# Patient Record
Sex: Male | Born: 1945 | Race: White | Hispanic: No | Marital: Married | Smoking: Never smoker
Health system: Southern US, Community
[De-identification: ages and names within clinical notes are randomized; demographics above are authoritative.]

## PROBLEM LIST (undated history)

## (undated) DIAGNOSIS — T8859XA Other complications of anesthesia, initial encounter: Secondary | ICD-10-CM

## (undated) DIAGNOSIS — Z86711 Personal history of pulmonary embolism: Secondary | ICD-10-CM

## (undated) DIAGNOSIS — G4730 Sleep apnea, unspecified: Secondary | ICD-10-CM

## (undated) DIAGNOSIS — E039 Hypothyroidism, unspecified: Secondary | ICD-10-CM

## (undated) DIAGNOSIS — I10 Essential (primary) hypertension: Secondary | ICD-10-CM

## (undated) DIAGNOSIS — C801 Malignant (primary) neoplasm, unspecified: Secondary | ICD-10-CM

## (undated) DIAGNOSIS — K56609 Unspecified intestinal obstruction, unspecified as to partial versus complete obstruction: Secondary | ICD-10-CM

## (undated) DIAGNOSIS — E119 Type 2 diabetes mellitus without complications: Secondary | ICD-10-CM

## (undated) DIAGNOSIS — K219 Gastro-esophageal reflux disease without esophagitis: Secondary | ICD-10-CM

## (undated) DIAGNOSIS — M1990 Unspecified osteoarthritis, unspecified site: Secondary | ICD-10-CM

## (undated) HISTORY — PX: HERNIA REPAIR: SHX51

## (undated) HISTORY — PX: CARPAL TUNNEL RELEASE: SHX101

## (undated) HISTORY — PX: CERVICAL FUSION: SHX112

## (undated) HISTORY — PX: BACK SURGERY: SHX140

## (undated) HISTORY — PX: ABDOMINAL SURGERY: SHX537

---

## 1998-08-17 ENCOUNTER — Ambulatory Visit (HOSPITAL_COMMUNITY): Admission: RE | Admit: 1998-08-17 | Discharge: 1998-08-17 | Payer: Self-pay | Admitting: Neurosurgery

## 2001-12-27 ENCOUNTER — Encounter: Payer: Self-pay | Admitting: Gastroenterology

## 2001-12-27 ENCOUNTER — Ambulatory Visit (HOSPITAL_COMMUNITY): Admission: RE | Admit: 2001-12-27 | Discharge: 2001-12-27 | Payer: Self-pay | Admitting: *Deleted

## 2007-07-23 ENCOUNTER — Ambulatory Visit: Payer: Self-pay | Admitting: Gastroenterology

## 2008-03-09 DIAGNOSIS — E785 Hyperlipidemia, unspecified: Secondary | ICD-10-CM | POA: Insufficient documentation

## 2008-03-09 DIAGNOSIS — K219 Gastro-esophageal reflux disease without esophagitis: Secondary | ICD-10-CM | POA: Insufficient documentation

## 2008-03-09 DIAGNOSIS — K4020 Bilateral inguinal hernia, without obstruction or gangrene, not specified as recurrent: Secondary | ICD-10-CM | POA: Insufficient documentation

## 2008-03-09 DIAGNOSIS — D509 Iron deficiency anemia, unspecified: Secondary | ICD-10-CM | POA: Insufficient documentation

## 2011-04-18 NOTE — Assessment & Plan Note (Signed)
[facility]                         GASTROENTEROLOGY OFFICE NOTE   James Barber, James Barber                   MRN:          [id number]  DATE:07/23/2007                            DOB:          1946/10/09    REFERRING PHYSICIAN:  [name]   REASON FOR REFERRAL:  Iron deficiency anemia.   HISTORY OF PRESENT ILLNESS:  [name] is a very nice 65 year old white  male referred through the courtesy of Dr. Rosiland Oz.  He was recently  found to have an iron deficiency anemia with Hemoccult negative stool.  Hemoglobin 12.8, MCV 79.6, iron saturation 13%, and ferritin of 6.  He  has had problems with GERD complicated by intermittent throat tightness.  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  He  has been treated with Prilosec on a daily basis for about the past 4  months for these symptoms.  His heartburn and indigestion have  completely resolved, but he still has an occasional tightness in his  throat.  He has no true solid food dysphagia.  He notes no change in  appetite, change in weight, change in bowel habits, melena,  hematochezia, or change in stool caliber.  There is no family history of  colon cancer, colon polyps, or inflammatory bowel disease.  He underwent  routine screening colonoscopy br Dr. Roosvelt Harps in January of 2003.  The examination was normal.  The prep was reported as fair with an  adequate exam.   PAST MEDICAL HISTORY:  Hyperlipidemia.  GERD.  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  Small bowel obstruction with laparotomy for adhesion lysis in  about 1990.   CURRENT MEDICATIONS:  Listed on the chart, updated, and reviewed.   MEDICATION ALLERGIES:  None known.   SOCIAL HISTORY AND REVIEW OF SYSTEMS:  Per the hand-written form.   PHYSICAL EXAM:  Well-developed, well-nourished white male in no acute  distress.  Height 6 feet,  weight 210.4 pounds, blood pressure 150/88, pulse 66 and  regular.  HEENT:  Anicteric sclerae.  Oropharynx clear.  CHEST:  Clear to auscultation bilaterally.  CARDIAC:  Regular rate and rhythm without murmurs appreciated.  ABDOMEN:  Soft and nontender.  Nondistended.  Normoactive bowel sounds.  No palpable organomegaly or masses.  There is a small easily reducible  right inguinal hernia.  RECTAL:  Deferred to the time of colonoscopy.  EXTREMITIES:  Without cyanosis, clubbing, or edema.  NEUROLOGIC:  Alert and oriented x3.  Grossly nonfocal.   ASSESSMENT AND PLAN:  1. Iron deficiency anemia with Hemoccult negative stool.      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  Rule out iron deficiency related to frequent      blood donation.  Risks, benefits, and alternatives to colonoscopy      with possibly biopsy and possible polypectomy, and risks, benefits,      and alternatives to upper endoscopy with possible  biopsy discussed      with the patient.  Plan for duodenal biopsies to R/O celiac disease      if the no pathology to explain blood loss is noted. He consents to      proceed.  The procedures will be scheduled electively.  He will      discontinue Prilosec and begin AcipHex 20 mg p.o. q. a.m. along      with all standard anti-reflux measures to attempt to more      adequately control his laryngopharyngeal reflux symptoms.  2. Small right inguinal hernia.  Expectant management.  If symptoms      worsen, he will need surgical consultation.     James Barber. James Dar, MD, [facility]  Electronically Signed    MTS/MedQ  DD: 07/23/2007  DT: 07/23/2007  Job #: [id number]   cc:   [name]

## 2012-08-03 ENCOUNTER — Emergency Department (HOSPITAL_COMMUNITY)
Admission: EM | Admit: 2012-08-03 | Discharge: 2012-08-03 | Discharge status: Against Medical Advice | Payer: BC Managed Care – PPO | Attending: Emergency Medicine | Admitting: Emergency Medicine

## 2012-08-03 ENCOUNTER — Encounter (HOSPITAL_COMMUNITY): Payer: Self-pay | Admitting: *Deleted

## 2012-08-03 DIAGNOSIS — K5900 Constipation, unspecified: Secondary | ICD-10-CM | POA: Insufficient documentation

## 2012-08-03 HISTORY — DX: Essential (primary) hypertension: I10

## 2012-08-03 NOTE — ED Notes (Signed)
Pt has not had a BM since prior to hernia repair surgery x 6 days ago. Pt has been using laxatives and stool softeners starting this afternoon.  Pt has hx of fecal impaction. Pt is experiencing a tremendous amount of pain at this time. No vomiting.

## 2012-08-04 ENCOUNTER — Encounter (HOSPITAL_COMMUNITY): Payer: Self-pay | Admitting: Emergency Medicine

## 2012-08-04 ENCOUNTER — Emergency Department (HOSPITAL_COMMUNITY)
Admission: EM | Admit: 2012-08-04 | Discharge: 2012-08-04 | Disposition: A | Discharge status: Home / Self Care | Payer: BC Managed Care – PPO | Attending: Emergency Medicine | Admitting: Emergency Medicine

## 2012-08-04 ENCOUNTER — Emergency Department (HOSPITAL_COMMUNITY): Payer: BC Managed Care – PPO

## 2012-08-04 DIAGNOSIS — R110 Nausea: Secondary | ICD-10-CM | POA: Insufficient documentation

## 2012-08-04 DIAGNOSIS — R109 Unspecified abdominal pain: Secondary | ICD-10-CM | POA: Insufficient documentation

## 2012-08-04 DIAGNOSIS — Z8719 Personal history of other diseases of the digestive system: Secondary | ICD-10-CM

## 2012-08-04 DIAGNOSIS — K5900 Constipation, unspecified: Secondary | ICD-10-CM | POA: Insufficient documentation

## 2012-08-04 LAB — COMPREHENSIVE METABOLIC PANEL
ALT: 28 U/L (ref 0–53)
AST: 16 U/L (ref 0–37)
Albumin: 3.7 g/dL (ref 3.5–5.2)
Alkaline Phosphatase: 89 U/L (ref 39–117)
BUN: 18 mg/dL (ref 6–23)
CO2: 25 mEq/L (ref 19–32)
Calcium: 9.8 mg/dL (ref 8.4–10.5)
Chloride: 95 mEq/L — ABNORMAL LOW (ref 96–112)
Creatinine, Ser: 0.97 mg/dL (ref 0.50–1.35)
GFR calc Af Amer: >90 mL/min (ref >90)
GFR calc non Af Amer: 86 mL/min — ABNORMAL LOW (ref >90)
Glucose, Bld: 160 mg/dL — ABNORMAL HIGH (ref 70–99)
Potassium: 3.8 mEq/L (ref 3.5–5.1)
Sodium: 133 mEq/L — ABNORMAL LOW (ref 135–145)
Total Bilirubin: 0.8 mg/dL (ref 0.3–1.2)
Total Protein: 7.1 g/dL (ref 6.0–8.3)

## 2012-08-04 LAB — CBC WITH DIFFERENTIAL/PLATELET
Basophils Absolute: 0 10*3/uL (ref 0.0–0.1)
Basophils Relative: 0 % (ref 0–1)
Eosinophils Absolute: 0 10*3/uL (ref 0.0–0.7)
Eosinophils Relative: 0 % (ref 0–5)
HCT: 38.2 % — ABNORMAL LOW (ref 39.0–52.0)
Hemoglobin: 13.5 g/dL (ref 13.0–17.0)
Lymphocytes Relative: 7 % — ABNORMAL LOW (ref 12–46)
Lymphs Abs: 0.6 10*3/uL — ABNORMAL LOW (ref 0.7–4.0)
MCH: 32.1 pg (ref 26.0–34.0)
MCHC: 35.3 g/dL (ref 30.0–36.0)
MCV: 91 fL (ref 78.0–100.0)
Monocytes Absolute: 0.4 10*3/uL (ref 0.1–1.0)
Monocytes Relative: 5 % (ref 3–12)
Neutro Abs: 7.6 10*3/uL (ref 1.7–7.7)
Neutrophils Relative %: 88 % — ABNORMAL HIGH (ref 43–77)
Platelets: 385 10*3/uL (ref 150–400)
RBC: 4.2 MIL/uL — ABNORMAL LOW (ref 4.22–5.81)
RDW: 12.6 % (ref 11.5–15.5)
WBC: 8.7 10*3/uL (ref 4.0–10.5)

## 2012-08-04 MED ORDER — SODIUM CHLORIDE 0.9 % IV BOLUS (SEPSIS)
1000.0000 mL | Freq: Once | INTRAVENOUS | Status: AC
  Administered 2012-08-04: 1000 mL via INTRAVENOUS

## 2012-08-04 MED ORDER — FLEET ENEMA 7-19 GM/118ML RE ENEM
1.0000 | ENEMA | Freq: Once | RECTAL | Status: AC
  Administered 2012-08-04: 1 via RECTAL
  Filled 2012-08-04: qty 1

## 2012-08-04 MED ORDER — ONDANSETRON HCL 4 MG/2ML IJ SOLN
4.0000 mg | Freq: Once | INTRAMUSCULAR | Status: AC
Start: 2012-08-04 — End: 2012-08-04
  Administered 2012-08-04: 4 mg via INTRAVENOUS
  Filled 2012-08-04: qty 2

## 2012-08-04 MED ORDER — HYDROMORPHONE HCL PF 1 MG/ML IJ SOLN
1.0000 mg | Freq: Once | INTRAMUSCULAR | Status: AC
  Administered 2012-08-04: 1 mg via INTRAVENOUS
  Filled 2012-08-04: qty 1

## 2012-08-04 MED ORDER — MAGNESIUM CITRATE PO SOLN: 296.0000 mL | Freq: Once | ORAL | Status: AC

## 2012-08-04 NOTE — ED Provider Notes (Addendum)
History     CSN: [id number]  Arrival date & time 08/04/12  4098   First MD Initiated Contact with Patient 08/04/12 [phone]      Chief Complaint  Patient presents with  . Abdominal Pain    (Consider location/radiation/quality/duration/timing/severity/associated sxs/prior treatment) HPI Comments: Patient presents with abd pain, nausea.  He recently underwent an inguinal hernia repair at [facility] center this past Monday.  Since that time, he has not had a bowel movement and is having severe abd pains.  He denies any fever or chills.  There are no urinary complaints.    Patient is a 66 y.o. male presenting with abdominal pain. The history is provided by the patient.  Abdominal Pain The primary symptoms of the illness include abdominal pain and nausea. The primary symptoms of the illness do not include fever, vomiting or diarrhea. The current episode started 2 days ago. The onset of the illness was gradual. The problem has been gradually worsening.  Associated with: recent surgery. The patient has had a change in bowel habit. Additional symptoms associated with the illness include constipation.    Past Medical History  Diagnosis Date  . Hypertension     Past Surgical History  Procedure Date  . Hernia repair   . Abdominal surgery   . Back surgery     No family history on file.  History  Substance Use Topics  . Smoking status: Never Smoker   . Smokeless tobacco: Not on file  . Alcohol Use: No      Review of Systems  Constitutional: Negative for fever.  Gastrointestinal: Positive for nausea, abdominal pain and constipation. Negative for vomiting and diarrhea.  All other systems reviewed and are negative.    Allergies  Review of patient's allergies indicates no known allergies.  Home Medications  No current outpatient prescriptions on file.  BP 129/60  Pulse 88  Temp 97.7 F (36.5 C) (Oral)  SpO2 99%  Physical Exam  Vitals reviewed. Constitutional: He  is oriented to person, place, and time. He appears well-developed and well-nourished.       Appears uncomfortable.    HENT:  Head: Normocephalic and atraumatic.  Neck: Normal range of motion. Neck supple.  Cardiovascular: Normal rate and regular rhythm.   No murmur heard. Pulmonary/Chest: Effort normal and breath sounds normal. No respiratory distress. He has no wheezes.  Abdominal: Soft. Bowel sounds are normal.       The abdomen is ttp in all four quadrants without rebound or guarding.  Bowel sounds are present.    Musculoskeletal: Normal range of motion. He exhibits no edema.  Neurological: He is oriented to person, place, and time.  Skin: Skin is warm and dry.    ED Course  Procedures (including critical care time)   Labs Reviewed  CBC WITH DIFFERENTIAL  COMPREHENSIVE METABOLIC PANEL   No results found.   No diagnosis found.    MDM  The patient presents here with abd pain, no bm since surgery on Monday for inguinal hernia.  He tried laxatives at home with no relief.  Workup today reveals the patient to be afebrile, no wbc, and the abdominal exam is benign.  The xrays show an abundance of stool in the colon and what appears to be a fecal impaction upon dre.  He was given two enemas with some results and relief.  He will be sent home with mag citrate, encourage stool softeners when using pain meds, and return prn.    The  patient began having more pressure in his rectum as he was being discharged and stated he was in the same shape he was in when he got here.  I then manually disimpacted him of a large amount of stool.  He then felt better and was discharged.        Geoffery Lyons, MD 08/04/12 1502  Geoffery Lyons, MD 08/04/12 [phone]

## 2012-08-04 NOTE — ED Notes (Signed)
Pt c/o of lower abd pain unrelieved by enema. MD going in to disimpact.

## 2012-08-04 NOTE — ED Notes (Signed)
Pt w/ hernia repair on Monday of this past week, seemed to be recovering normally until last p.m. Has abd distention, LLQ pain w/ rebound tenderness. Pt was sent from [facility] this morning. No BM since prior to surgery.

## 2012-11-06 ENCOUNTER — Other Ambulatory Visit: Payer: Self-pay | Admitting: Neurological Surgery

## 2012-11-07 ENCOUNTER — Encounter (HOSPITAL_COMMUNITY): Payer: Self-pay | Admitting: Pharmacy Technician

## 2012-11-11 ENCOUNTER — Encounter (HOSPITAL_COMMUNITY): Payer: Self-pay

## 2012-11-11 ENCOUNTER — Ambulatory Visit (HOSPITAL_COMMUNITY)
Admission: RE | Admit: 2012-11-11 | Discharge: 2012-11-11 | Disposition: A | Discharge status: Home / Self Care | Payer: BC Managed Care – PPO | Source: Ambulatory Visit | Attending: Anesthesiology | Admitting: Anesthesiology

## 2012-11-11 ENCOUNTER — Encounter (HOSPITAL_COMMUNITY)
Admission: RE | Admit: 2012-11-11 | Discharge: 2012-11-11 | Disposition: A | Discharge status: Home / Self Care | Payer: BC Managed Care – PPO | Source: Ambulatory Visit | Attending: Neurological Surgery | Admitting: Neurological Surgery

## 2012-11-11 HISTORY — DX: Gastro-esophageal reflux disease without esophagitis: K21.9

## 2012-11-11 LAB — CBC
HCT: 42.2 % (ref 39.0–52.0)
Hemoglobin: 14.2 g/dL (ref 13.0–17.0)
MCV: 93.2 fL (ref 78.0–100.0)
RBC: 4.53 MIL/uL (ref 4.22–5.81)
WBC: 4.9 10*3/uL (ref 4.0–10.5)

## 2012-11-11 LAB — BASIC METABOLIC PANEL
BUN: 17 mg/dL (ref 6–23)
CO2: 28 mEq/L (ref 19–32)
Chloride: 103 mEq/L (ref 96–112)
Creatinine, Ser: 1.44 mg/dL — ABNORMAL HIGH (ref 0.50–1.35)
GFR calc Af Amer: 58 mL/min — ABNORMAL LOW (ref >90)
Potassium: 3.8 mEq/L (ref 3.5–5.1)

## 2012-11-11 LAB — SURGICAL PCR SCREEN: Staphylococcus aureus: NEGATIVE

## 2012-11-11 MED ORDER — CEFAZOLIN SODIUM-DEXTROSE 2-3 GM-% IV SOLR
2.0000 g | INTRAVENOUS | Status: AC
  Administered 2012-11-12: 2 g via INTRAVENOUS

## 2012-11-11 NOTE — Pre-Procedure Instructions (Signed)
20 James Barber  11/11/2012   Your procedure is scheduled on:  Tuesday, December 10th   Report to [facility] at 11:45  AM.  Call this number if you have problems the morning of surgery: [phone]   Remember:   Do not eat food or drink any liquids:After Midnight Monday.    Take these medicines the morning of surgery with A SIP OF WATER: Neurontin, Prilosec, Percocet   Do not wear jewelry.  Do not wear lotions, powders, or colognes. You may NOT wear deodorant.   Men may shave face and neck.   Do not bring valuables to the hospital.  Contacts, dentures or bridgework may not be worn into surgery.   Leave suitcase in the car. After surgery it may be brought to your room.  For patients admitted to the hospital, checkout time is 11:00 AM the day of discharge.   Patients discharged the day of surgery will not be allowed to drive home.  Name and phone number of your driver:    Special Instructions: Shower using CHG 2 nights before surgery and the night before surgery.  If you shower the day of surgery use CHG.  Use special wash - you have one bottle of CHG for all showers.  You should use approximately 1/3 of the bottle for each shower.   Please read over the following fact sheets that you were given: Pain Booklet and Surgical Site Infection Prevention

## 2012-11-11 NOTE — Progress Notes (Signed)
Pt said that office notified him to arrive at 1145.

## 2012-11-12 ENCOUNTER — Encounter (HOSPITAL_COMMUNITY): Payer: Self-pay | Admitting: *Deleted

## 2012-11-12 ENCOUNTER — Encounter (HOSPITAL_COMMUNITY)
Admission: RE | Disposition: A | Discharge status: Home / Self Care | Payer: Self-pay | Source: Ambulatory Visit | Attending: Neurological Surgery

## 2012-11-12 ENCOUNTER — Observation Stay (HOSPITAL_COMMUNITY)
Admission: RE | Admit: 2012-11-12 | Discharge: 2012-11-13 | Disposition: A | Discharge status: Home / Self Care | Payer: BC Managed Care – PPO | Source: Ambulatory Visit | Attending: Neurological Surgery | Admitting: Neurological Surgery

## 2012-11-12 ENCOUNTER — Ambulatory Visit (HOSPITAL_COMMUNITY): Payer: BC Managed Care – PPO | Admitting: *Deleted

## 2012-11-12 ENCOUNTER — Ambulatory Visit (HOSPITAL_COMMUNITY): Payer: BC Managed Care – PPO

## 2012-11-12 DIAGNOSIS — Z01812 Encounter for preprocedural laboratory examination: Secondary | ICD-10-CM | POA: Insufficient documentation

## 2012-11-12 DIAGNOSIS — M4722 Other spondylosis with radiculopathy, cervical region: Secondary | ICD-10-CM

## 2012-11-12 DIAGNOSIS — Z01810 Encounter for preprocedural cardiovascular examination: Secondary | ICD-10-CM | POA: Insufficient documentation

## 2012-11-12 DIAGNOSIS — I10 Essential (primary) hypertension: Secondary | ICD-10-CM | POA: Insufficient documentation

## 2012-11-12 DIAGNOSIS — K219 Gastro-esophageal reflux disease without esophagitis: Secondary | ICD-10-CM | POA: Insufficient documentation

## 2012-11-12 DIAGNOSIS — M47812 Spondylosis without myelopathy or radiculopathy, cervical region: Principal | ICD-10-CM | POA: Insufficient documentation

## 2012-11-12 DIAGNOSIS — Z01818 Encounter for other preprocedural examination: Secondary | ICD-10-CM | POA: Insufficient documentation

## 2012-11-12 HISTORY — PX: ANTERIOR CERVICAL DECOMP/DISCECTOMY FUSION: SHX1161

## 2012-11-12 LAB — ABO/RH: ABO/RH(D): O POS

## 2012-11-12 SURGERY — ANTERIOR CERVICAL DECOMPRESSION/DISCECTOMY FUSION 2 LEVELS
Anesthesia: General | Site: Spine Cervical | Wound class: Clean

## 2012-11-12 MED ORDER — HYDROMORPHONE HCL PF 1 MG/ML IJ SOLN
0.2500 mg | INTRAMUSCULAR | Status: DC | PRN
  Administered 2012-11-12 (×4): 0.5 mg via INTRAVENOUS

## 2012-11-12 MED ORDER — DIAZEPAM 5 MG PO TABS
5.0000 mg | ORAL_TABLET | Freq: Four times a day (QID) | ORAL | Status: DC | PRN
  Administered 2012-11-12: 5 mg via ORAL
  Filled 2012-11-12: qty 1

## 2012-11-12 MED ORDER — GABAPENTIN 400 MG PO CAPS
400.0000 mg | ORAL_CAPSULE | Freq: Three times a day (TID) | ORAL | Status: DC
  Administered 2012-11-12: 400 mg via ORAL
  Filled 2012-11-12 (×4): qty 1

## 2012-11-12 MED ORDER — PROMETHAZINE HCL 25 MG/ML IJ SOLN: 6.2500 mg | INTRAMUSCULAR | Status: DC | PRN

## 2012-11-12 MED ORDER — LACTATED RINGERS IV SOLN
INTRAVENOUS | Status: DC | PRN
  Administered 2012-11-12 (×3): via INTRAVENOUS

## 2012-11-12 MED ORDER — SODIUM CHLORIDE 0.9 % IV SOLN
10.0000 mg | INTRAVENOUS | Status: DC | PRN
  Administered 2012-11-12: 25 ug/min via INTRAVENOUS

## 2012-11-12 MED ORDER — ONDANSETRON HCL 4 MG/2ML IJ SOLN
INTRAMUSCULAR | Status: DC | PRN
  Administered 2012-11-12: 4 mg via INTRAVENOUS

## 2012-11-12 MED ORDER — ONDANSETRON HCL 4 MG/2ML IJ SOLN: 4.0000 mg | INTRAMUSCULAR | Status: DC | PRN

## 2012-11-12 MED ORDER — FENTANYL CITRATE 0.05 MG/ML IJ SOLN
INTRAMUSCULAR | Status: DC | PRN
  Administered 2012-11-12 (×2): 50 ug via INTRAVENOUS
  Administered 2012-11-12: 25 ug via INTRAVENOUS
  Administered 2012-11-12: 125 ug via INTRAVENOUS

## 2012-11-12 MED ORDER — MIDAZOLAM HCL 5 MG/5ML IJ SOLN
INTRAMUSCULAR | Status: DC | PRN
  Administered 2012-11-12 (×2): 1 mg via INTRAVENOUS

## 2012-11-12 MED ORDER — ATORVASTATIN CALCIUM 20 MG PO TABS
20.0000 mg | ORAL_TABLET | Freq: Every day | ORAL | Status: DC
  Filled 2012-11-12: qty 1

## 2012-11-12 MED ORDER — SODIUM CHLORIDE 0.9 % IR SOLN
Status: DC | PRN
  Administered 2012-11-12: 15:00:00

## 2012-11-12 MED ORDER — MENTHOL 3 MG MT LOZG: 1.0000 | LOZENGE | OROMUCOSAL | Status: DC | PRN

## 2012-11-12 MED ORDER — GLYCOPYRROLATE 0.2 MG/ML IJ SOLN
INTRAMUSCULAR | Status: DC | PRN
  Administered 2012-11-12: 0.6 mg via INTRAVENOUS

## 2012-11-12 MED ORDER — THROMBIN 5000 UNITS EX KIT
PACK | CUTANEOUS | Status: DC | PRN
  Administered 2012-11-12 (×2): 5000 [IU] via TOPICAL

## 2012-11-12 MED ORDER — BACITRACIN 50000 UNITS IM SOLR
INTRAMUSCULAR | Status: AC
  Filled 2012-11-12: qty 1

## 2012-11-12 MED ORDER — OXYCODONE-ACETAMINOPHEN 5-325 MG PO TABS
1.0000 | ORAL_TABLET | ORAL | Status: DC | PRN
  Administered 2012-11-12: 2 via ORAL
  Filled 2012-11-12 (×2): qty 2

## 2012-11-12 MED ORDER — DEXAMETHASONE SODIUM PHOSPHATE 4 MG/ML IJ SOLN
INTRAMUSCULAR | Status: DC | PRN
  Administered 2012-11-12: 10 mg via INTRAVENOUS

## 2012-11-12 MED ORDER — SODIUM CHLORIDE 0.9 % IJ SOLN
3.0000 mL | Freq: Two times a day (BID) | INTRAMUSCULAR | Status: DC
  Administered 2012-11-12: 3 mL via INTRAVENOUS

## 2012-11-12 MED ORDER — MORPHINE SULFATE 2 MG/ML IJ SOLN: 1.0000 mg | INTRAMUSCULAR | Status: DC | PRN

## 2012-11-12 MED ORDER — OXYCODONE HCL 5 MG/5ML PO SOLN: 5.0000 mg | Freq: Once | ORAL | Status: DC | PRN

## 2012-11-12 MED ORDER — 0.9 % SODIUM CHLORIDE (POUR BTL) OPTIME
TOPICAL | Status: DC | PRN
  Administered 2012-11-12: 1000 mL

## 2012-11-12 MED ORDER — SODIUM CHLORIDE 0.9 % IJ SOLN: 3.0000 mL | INTRAMUSCULAR | Status: DC | PRN

## 2012-11-12 MED ORDER — OMEPRAZOLE MAGNESIUM 20 MG PO TBEC
20.0000 mg | DELAYED_RELEASE_TABLET | ORAL | Status: DC
  Filled 2012-11-12: qty 1

## 2012-11-12 MED ORDER — DOCUSATE SODIUM 100 MG PO CAPS
100.0000 mg | ORAL_CAPSULE | Freq: Two times a day (BID) | ORAL | Status: DC
  Administered 2012-11-12: 100 mg via ORAL
  Filled 2012-11-12: qty 1

## 2012-11-12 MED ORDER — PHENYLEPHRINE HCL 10 MG/ML IJ SOLN
INTRAMUSCULAR | Status: DC | PRN
  Administered 2012-11-12: 80 ug via INTRAVENOUS
  Administered 2012-11-12: 40 ug via INTRAVENOUS
  Administered 2012-11-12 (×3): 80 ug via INTRAVENOUS
  Administered 2012-11-12: 40 ug via INTRAVENOUS

## 2012-11-12 MED ORDER — SODIUM CHLORIDE 0.9 % IV SOLN
INTRAVENOUS | Status: AC
  Filled 2012-11-12: qty 500

## 2012-11-12 MED ORDER — CEFAZOLIN SODIUM 1-5 GM-% IV SOLN
1.0000 g | Freq: Three times a day (TID) | INTRAVENOUS | Status: AC
  Administered 2012-11-12 – 2012-11-13 (×2): 1 g via INTRAVENOUS
  Filled 2012-11-12 (×2): qty 50

## 2012-11-12 MED ORDER — HYDROMORPHONE HCL PF 1 MG/ML IJ SOLN
INTRAMUSCULAR | Status: DC | PRN
  Administered 2012-11-12 (×2): .25 mg via INTRAVENOUS

## 2012-11-12 MED ORDER — BUPIVACAINE HCL (PF) 0.5 % IJ SOLN
INTRAMUSCULAR | Status: DC | PRN
  Administered 2012-11-12: 3.5 mL

## 2012-11-12 MED ORDER — NEOSTIGMINE METHYLSULFATE 1 MG/ML IJ SOLN
INTRAMUSCULAR | Status: DC | PRN
  Administered 2012-11-12: 4 mg via INTRAVENOUS

## 2012-11-12 MED ORDER — OXYCODONE HCL 5 MG PO TABS: 5.0000 mg | ORAL_TABLET | Freq: Once | ORAL | Status: DC | PRN

## 2012-11-12 MED ORDER — PHENOL 1.4 % MT LIQD: 1.0000 | OROMUCOSAL | Status: DC | PRN

## 2012-11-12 MED ORDER — HEMOSTATIC AGENTS (NO CHARGE) OPTIME
TOPICAL | Status: DC | PRN
  Administered 2012-11-12: 1 via TOPICAL

## 2012-11-12 MED ORDER — ACETAMINOPHEN 650 MG RE SUPP: 650.0000 mg | RECTAL | Status: DC | PRN

## 2012-11-12 MED ORDER — HYDROMORPHONE HCL PF 1 MG/ML IJ SOLN
INTRAMUSCULAR | Status: AC
  Filled 2012-11-12: qty 1

## 2012-11-12 MED ORDER — LIDOCAINE-EPINEPHRINE 1 %-1:100000 IJ SOLN
INTRAMUSCULAR | Status: DC | PRN
  Administered 2012-11-12: 3.5 mL

## 2012-11-12 MED ORDER — ALUM & MAG HYDROXIDE-SIMETH 200-200-20 MG/5ML PO SUSP: 30.0000 mL | Freq: Four times a day (QID) | ORAL | Status: DC | PRN

## 2012-11-12 MED ORDER — ROCURONIUM BROMIDE 100 MG/10ML IV SOLN
INTRAVENOUS | Status: DC | PRN
  Administered 2012-11-12: 50 mg via INTRAVENOUS

## 2012-11-12 MED ORDER — LISINOPRIL 20 MG PO TABS
20.0000 mg | ORAL_TABLET | Freq: Every day | ORAL | Status: DC
  Administered 2012-11-12: 20 mg via ORAL
  Filled 2012-11-12 (×2): qty 1

## 2012-11-12 MED ORDER — VECURONIUM BROMIDE 10 MG IV SOLR
INTRAVENOUS | Status: DC | PRN
  Administered 2012-11-12: 3 mg via INTRAVENOUS
  Administered 2012-11-12 (×3): 2 mg via INTRAVENOUS

## 2012-11-12 MED ORDER — CEFAZOLIN SODIUM-DEXTROSE 2-3 GM-% IV SOLR
INTRAVENOUS | Status: AC
  Filled 2012-11-12: qty 50

## 2012-11-12 MED ORDER — ACETAMINOPHEN 325 MG PO TABS: 650.0000 mg | ORAL_TABLET | ORAL | Status: DC | PRN

## 2012-11-12 MED ORDER — ALBUMIN HUMAN 5 % IV SOLN
INTRAVENOUS | Status: DC | PRN
  Administered 2012-11-12: 15:00:00 via INTRAVENOUS

## 2012-11-12 MED ORDER — PROPOFOL 10 MG/ML IV BOLUS
INTRAVENOUS | Status: DC | PRN
  Administered 2012-11-12: 170 mg via INTRAVENOUS

## 2012-11-12 SURGICAL SUPPLY — 58 items
BAG DECANTER FOR FLEXI CONT (MISCELLANEOUS) ×2 IMPLANT
BANDAGE GAUZE ELAST BULKY 4 IN (GAUZE/BANDAGES/DRESSINGS) ×4 IMPLANT
BIT DRILL 14MM (INSTRUMENTS) ×1 IMPLANT
BIT DRILL NEURO 2X3.1 SFT TUCH (MISCELLANEOUS) ×1 IMPLANT
BUR BARREL STRAIGHT FLUTE 4.0 (BURR) ×2 IMPLANT
CANISTER SUCTION 2500CC (MISCELLANEOUS) ×2 IMPLANT
CLOTH BEACON ORANGE TIMEOUT ST (SAFETY) ×2 IMPLANT
CONT SPEC 4OZ CLIKSEAL STRL BL (MISCELLANEOUS) ×2 IMPLANT
DECANTER SPIKE VIAL GLASS SM (MISCELLANEOUS) ×2 IMPLANT
DERMABOND ADVANCED (GAUZE/BANDAGES/DRESSINGS)
DERMABOND ADVANCED .7 DNX12 (GAUZE/BANDAGES/DRESSINGS) IMPLANT
DRAPE LAPAROTOMY 100X72 PEDS (DRAPES) ×2 IMPLANT
DRAPE MICROSCOPE LEICA (MISCELLANEOUS) IMPLANT
DRAPE POUCH INSTRU U-SHP 10X18 (DRAPES) ×2 IMPLANT
DRAPE PROXIMA HALF (DRAPES) ×2 IMPLANT
DRILL 14MM (INSTRUMENTS) ×2
DRILL NEURO 2X3.1 SOFT TOUCH (MISCELLANEOUS) ×2
DURAPREP 6ML APPLICATOR 50/CS (WOUND CARE) ×2 IMPLANT
ELECT REM PT RETURN 9FT ADLT (ELECTROSURGICAL) ×2
ELECTRODE REM PT RTRN 9FT ADLT (ELECTROSURGICAL) ×1 IMPLANT
GAUZE SPONGE 4X4 16PLY XRAY LF (GAUZE/BANDAGES/DRESSINGS) IMPLANT
GLOVE BIOGEL PI IND STRL 6.5 (GLOVE) ×1 IMPLANT
GLOVE BIOGEL PI IND STRL 7.5 (GLOVE) ×1 IMPLANT
GLOVE BIOGEL PI IND STRL 8.5 (GLOVE) ×1 IMPLANT
GLOVE BIOGEL PI INDICATOR 6.5 (GLOVE) ×1
GLOVE BIOGEL PI INDICATOR 7.5 (GLOVE) ×1
GLOVE BIOGEL PI INDICATOR 8.5 (GLOVE) ×1
GLOVE ECLIPSE 6.5 STRL STRAW (GLOVE) ×4 IMPLANT
GLOVE ECLIPSE 7.5 STRL STRAW (GLOVE) ×4 IMPLANT
GLOVE ECLIPSE 8.5 STRL (GLOVE) ×4 IMPLANT
GLOVE EXAM NITRILE LRG STRL (GLOVE) IMPLANT
GLOVE EXAM NITRILE MD LF STRL (GLOVE) IMPLANT
GLOVE EXAM NITRILE XL STR (GLOVE) IMPLANT
GLOVE EXAM NITRILE XS STR PU (GLOVE) IMPLANT
GOWN BRE IMP SLV AUR LG STRL (GOWN DISPOSABLE) ×2 IMPLANT
GOWN BRE IMP SLV AUR XL STRL (GOWN DISPOSABLE) ×6 IMPLANT
GOWN STRL REIN 2XL LVL4 (GOWN DISPOSABLE) ×2 IMPLANT
HEAD HALTER (SOFTGOODS) ×2 IMPLANT
KIT BASIN OR (CUSTOM PROCEDURE TRAY) ×2 IMPLANT
KIT ROOM TURNOVER OR (KITS) ×2 IMPLANT
NEEDLE HYPO 22GX1.5 SAFETY (NEEDLE) ×2 IMPLANT
NEEDLE SPNL 22GX3.5 QUINCKE BK (NEEDLE) ×2 IMPLANT
NS IRRIG 1000ML POUR BTL (IV SOLUTION) ×2 IMPLANT
PACK LAMINECTOMY NEURO (CUSTOM PROCEDURE TRAY) ×2 IMPLANT
PAD ARMBOARD 7.5X6 YLW CONV (MISCELLANEOUS) ×6 IMPLANT
PLATE 34MM (Plate) ×2 IMPLANT
PUTTY DBM 5CC ×2 IMPLANT
RUBBERBAND STERILE (MISCELLANEOUS) IMPLANT
SCREW 14MM (Screw) ×2 IMPLANT
SPACER PAR CORT CERV S (Spacer) ×4 IMPLANT
SPONGE GAUZE 4X4 12PLY (GAUZE/BANDAGES/DRESSINGS) IMPLANT
SPONGE INTESTINAL PEANUT (DISPOSABLE) ×2 IMPLANT
SPONGE SURGIFOAM ABS GEL SZ50 (HEMOSTASIS) ×2 IMPLANT
SUT VIC AB 3-0 SH 8-18 (SUTURE) ×4 IMPLANT
SYR 20ML ECCENTRIC (SYRINGE) ×2 IMPLANT
TOWEL OR 17X24 6PK STRL BLUE (TOWEL DISPOSABLE) ×2 IMPLANT
TOWEL OR 17X26 10 PK STRL BLUE (TOWEL DISPOSABLE) ×2 IMPLANT
WATER STERILE IRR 1000ML POUR (IV SOLUTION) ×2 IMPLANT

## 2012-11-12 NOTE — Anesthesia Postprocedure Evaluation (Signed)
Anesthesia Post Note  Patient: James Barber  Procedure(s) Performed: Procedure(s) (LRB): ANTERIOR CERVICAL DECOMPRESSION/DISCECTOMY FUSION 2 LEVELS (N/A)  Anesthesia type: general  Patient location: PACU  Post pain: Pain level controlled  Post assessment: Patient's Cardiovascular Status Stable  Last Vitals:  Filed Vitals:   11/12/12 1134  BP: 107/71  Pulse: 82  Temp: 36.7 C  Resp: 18    Post vital signs: Reviewed and stable  Level of consciousness: sedated  Complications: No apparent anesthesia complications

## 2012-11-12 NOTE — H&P (Signed)
CHIEF COMPLAINT:   Pinched nerve with weakness in the right hand.  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 Emeline General.  He has had an arthrodesis at C4-5 and C5-6, and he has had some posterior decompressive surgery also.  His last operation was at least 10 years ago.  He notes that he recovered well after a period of time and has done reasonably well with his neck.  Several months ago, he started to develop rather acutely a crick in the back of his neck between his shoulder blades and he notes it has progressed now to develop some weakness into the right hand particularly.  He finds that he has weak grip strength in the right hand and he has a pain that radiates from the back of the neck across the region of the supraspinatus muscle into the extensor surface of the arm into the extensor aspect of the forearm and into the hand.  He gets some numbness in his thumb and in his pinkie finger.  His biggest concern, however, is that his grip strength has deteriorated substantially.  He has been seen and treated by Dr. Nicki Reaper but because he has had persistent and evolving weakness, he was sent for an MRI at [facility].  This study is reviewed in the office today and it demonstrates that the patient has the previous arthrodesis at C4-5 and C5-6.  He does have some spondylitic disease at C2-3 and 3-4 with some effacement of the ventral aspect of the spinal cord but the foramen appear quite adequately patent.  At C6-7, he has marked degenerative changes in the disc space with biforaminal stenosis more severe on the right than on the left.  At C7-T1 he has advanced spondylitic disease also with severe biforaminal stenosis on both sides.    PAST MEDICAL HISTORY:  His general health has been good.  He does have some hypertension.   Aside from the neck surgeries, he has also had a hernia operation in the past.  SOCIAL HISTORY:    He does not smoke or use alcohol.  Height and weight have been stable at 6' and 200 pounds.    REVIEW OF SYSTEMS:   Notable only for the items in the History of Present Illness.  CURRENT MEDICATIONS:  Antihypertensive medication 20 mg. q.d., Crestor 10 mg. q.d., Neurontin 3600 mg. q.d., Nabumetone 750 mg. q.d., 81 mg. aspirin per day and iron sulfate supplement.   [name]. [name]  #[id number] DOB:  04-07-1946   November 05, 2012 Page 2  PHYSICAL EXAMINATION:  His physical exam reveals he has turning of approximately 45 degrees to the right and 60 degrees to the left.  He flexes and extends quite causally on plain radiographs.  His motor function appears good in the deltoid, biceps, triceps bilaterally.  The wrist extensors appear to be quite intact bilaterally; however, the finger extensors on the right are weak at 4-/5 and the intrinsic function on the right is weak at 3/5.  There is some early evidence of atrophy of the interossei on the right hand compared to the left.  Left upper extremity strength is all intact.    Today in the office to further his workup I obtained lateral flexion and extension radiographs.  This is to check the integrity of his previously placed fusions and to also  examine the anatomy of C6-7 and C7-T1 to see if anterior cervical approach would be available conventionally to the C7-T1 levels.  Two radiographs are examined and these demonstrate the patient has a long neck with a low hanging clavicle and manubrium allowing each access to C7-T1.  There is advanced spondylitic disease closing off the disc space anterior at C7-T1.  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  Previous fusions at C4-5 and 5-6 are well healed.  The patient does have a straight neck with loss of normal lordotic curve noted.  Movement  between flexion and extension is quite ample at all levels particularly at the lower cervical levels of C7-T1.    IMPRESSION and PLAN:   The patient has evidence of significant weakness in the intrinsic muscles and in the finger extensors on the right side compared to the left.  Review of his MRI demonstrates he has advanced spondylitic disease at C6-7 and C7-T1 with right-sided foraminal stenosis worse than left at C6-7 and biforaminal stenosis at C7-T1.    The patient has advanced spondylitic disease at C6-7 and C7-T1.  Ultimately he will need to have this area decompressed and stabilized.  It will be done via an anterior approach in his situation from the right side as he notes that he has had previous anterior approaches by Dr. Emeline General.  He did have a period of some voice change after his last operation with some hoarseness of voice for several months' time.  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  I have advised that the two levels need to be decompressed even though more recently he has felt some slight alleviation of the weakness and alleviation of the pain since he has been on a 10-day steroid dose course.  I would advise that we do not use any further steroid medications.  Because of the advanced nature of the anatomic findings, anterior decompression and arthrodesis would be in his best interest to preserve the function of his nervous system.    I have advised that we proceed with surgery sometime at his earliest convenience and we will schedule this for him.  I discussed the major risks of the surgery which include but are not limited to the possibility of nerve injury, difficulties with swallowing, injury to the nerve to the voice box which could cause hoarseness or difficulty with swallowing, and of course the long term necessity that he heal this fusion or with a nonunion he could have some significant pain that results in  the need for further surgery.  All of these are discussed and considered; however, I am hopeful that we will be able to get him through the surgery with good relief and hopefully some return of function in his right upper extremity.

## 2012-11-12 NOTE — Plan of Care (Signed)
Problem: Consults Goal: Diagnosis - Spinal Surgery Outcome: Completed/Met Date Met:  11/12/12 Cervical Spine Fusion

## 2012-11-12 NOTE — Transfer of Care (Signed)
Immediate Anesthesia Transfer of Care Note  Patient: VISHNU MOELLER  Procedure(s) Performed: Procedure(s) (LRB) with comments: ANTERIOR CERVICAL DECOMPRESSION/DISCECTOMY FUSION 2 LEVELS (N/A) - Right Side approach Cervical six-seven,Cervical seven -thoracic one Anterior cervical decompression/diskectomy/fusion  Patient Location: PACU  Anesthesia Type:General  Level of Consciousness: awake, oriented, patient cooperative and responds to stimulation  Airway & Oxygen Therapy: Patient Spontanous Breathing and Patient connected to nasal cannula oxygen  Post-op Assessment: Report given to PACU RN and Post -op Vital signs reviewed and stable  Post vital signs: Reviewed and stable  Complications: No apparent anesthesia complications

## 2012-11-12 NOTE — Preoperative (Signed)
Beta Blockers   Reason not to administer Beta Blockers:Not Applicable 

## 2012-11-12 NOTE — Anesthesia Preprocedure Evaluation (Addendum)
Anesthesia Evaluation  Patient identified by MRN, date of birth, ID band Patient awake    Reviewed: Allergy & Precautions, H&P , NPO status , Patient's Chart, lab work & pertinent test results  History of Anesthesia Complications Negative for: history of anesthetic complications  Airway Mallampati: II TM Distance: >3 FB Neck ROM: Full    Dental  (+) Teeth Intact, Dental Advisory Given and Caps   Pulmonary neg pulmonary ROS,    Pulmonary exam normal       Cardiovascular hypertension, Pt. on medications     Neuro/Psych negative neurological ROS     GI/Hepatic Neg liver ROS, GERD-  Medicated,  Endo/Other  negative endocrine ROS  Renal/GU Renal InsufficiencyRenal disease     Musculoskeletal   Abdominal   Peds  Hematology   Anesthesia Other Findings   Reproductive/Obstetrics                          Anesthesia Physical Anesthesia Plan  ASA: II  Anesthesia Plan: General   Post-op Pain Management:    Induction: Intravenous  Airway Management Planned: Oral ETT and Video Laryngoscope Planned  Additional Equipment:   Intra-op Plan:   Post-operative Plan: Extubation in OR  Informed Consent: I have reviewed the patients History and Physical, chart, labs and discussed the procedure including the risks, benefits and alternatives for the proposed anesthesia with the patient or authorized representative who has indicated his/her understanding and acceptance.   Dental advisory given  Plan Discussed with: CRNA, Anesthesiologist and Surgeon  Anesthesia Plan Comments: (Pt reports increased neurological symptoms with neck extension.  Plan to maintain neutral neck position and use glide scope for intubation. )      Anesthesia Quick Evaluation

## 2012-11-12 NOTE — Op Note (Signed)
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 Asst.: Lelon Perla Indications: Patient is a 66 year old individual is had 4 previous cervical spine surgery by Dr. Wynetta Fines in [place] recently he started to develop weakness in the right hand and is developed a fair amount of weakness in the intrinsic muscles of the right hand an MRI demonstrates that he has advanced disease at C6-7 and C7-T1. His other levels appear to be fused. Been advised regarding the need for surgical decompression and stabilization. Procedure: The patient was brought to the operating room placed on the table in supine position. After the smooth induction of general endotracheal anesthesia neck was placed in 5 pounds of halter traction and prepped with alcohol and DuraPrep. After sterile draping and appropriate timeout procedure a transverse incision was created in the left side of the neck and carried down to the platysma. The plane between the sternocleidomastoid and strap muscles dissected bluntly until the prevertebral space was reached. The first identifiable disc space was noted to be C6-C7 on a localizing radiograph. The dissection was then undertaken in the longus coli muscle to allow placement of a self-retaining Caspar type retractor.  The anterior longitudinal ligament was opened at C6-C7 and ventral osteophytes were removed with a Leksell rongeur and Kerrison punch. Interspace was cleared of significant quantity of the degenerated disc material ~region of the posterior longitudinal ligament was reached. Dissection was carried out using a  high-speed drill and 3-0 Karlin curettes. Uncinate processes were drilled down and removed and osteophytes from the inferior margin of the body of C6 were removed with a Kerrison 2 mm gold punch. After the central canal and lateral recesses were well decompressed the stasis was achieved with the bipolar cautery and some small pledgets of Gelfoam soaked in thrombin that were later irrigated away.  A 7 mm transgraft was then prepared by enlarging the central cavity and filling with demineralized bone matrix and placing into the interspace. C7-T1 Was decompressed and fused in a similar fashion. There were large lateral osteophytes that were encountered at both levels and ventral osteophytosis was quite substantial. This required substantial drilling to achieve adequate exposure and decompression of the lateral nerve roots and the central canal.  Next the retractor was removed and a 34 mm trestle plate was placed over the vertebral bodies and secured with 14 variable angle screws. A final localizing radiograph identified the position of the surgical construct. The stasis was achieved in the soft tissues and then the platysma was closed with 3-0 Vicryl in an interrupted fashion and 3-0 Vicryl was used in the subcuticular tissue. Blood loss was estimated at 100 cc

## 2012-11-13 ENCOUNTER — Encounter (HOSPITAL_COMMUNITY): Payer: Self-pay | Admitting: Neurological Surgery

## 2012-11-13 MED ORDER — OXYCODONE-ACETAMINOPHEN 10-325 MG PO TABS: 1.0000 | ORAL_TABLET | ORAL | Status: DC | PRN

## 2012-11-13 MED ORDER — DIAZEPAM 5 MG PO TABS: 5.0000 mg | ORAL_TABLET | Freq: Four times a day (QID) | ORAL | Status: DC | PRN

## 2012-11-13 NOTE — Discharge Summary (Signed)
Physician Discharge Summary  Patient ID: James Barber MRN: [id number] DOB/AGE: [birth year] 66 y.o.  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 *    Discharged Condition: good  Hospital Course: Patient was admitted to undergo surgical decompression at C6-7 and C7-T1 the an anterior approach he tolerated surgery well  Consults: None  Significant Diagnostic Studies: MRI cervical spine  Treatments: Anterior cervical decompression C6-7 C7-T1 arthrodesis with structural allograft. Anterior plate fixation [id number]  Discharge Exam: Blood pressure 134/76, pulse 98, temperature 98.4 F (36.9 C), temperature source Oral, resp. rate 18, SpO2 96.00%. Incision is clean and dry. Motor function intact in up her extremities on left right hand intrinsic weakness 3/5  Disposition: 01-Home or Self Care  Discharge Orders    Future Orders Please Complete By Expires   Diet - low sodium heart healthy      Increase activity slowly      Discharge instructions      Comments:   Okay to shower. Do not apply salves or appointments to incision. No heavy lifting with the upper extremities greater than 15 pounds. May resume driving when not requiring pain medication and patient feels comfortable with doing so.   Call MD for:  redness, tenderness, or signs of infection (pain, swelling, redness, odor or green/yellow discharge around incision site)      Call MD for:  severe uncontrolled pain      Call MD for:  temperature >100.4          Medication List     As of 11/13/2012  9:16 AM    TAKE these medications         aspirin EC 81 MG tablet   Take 81 mg by mouth daily.      diazepam 5 MG tablet   Commonly known as: VALIUM   Take  1 tablet (5 mg total) by mouth every 6 (six) hours as needed (Muscle spasm).      ferrous gluconate 324 MG tablet   Commonly known as: FERGON   Take 324 mg by mouth daily with breakfast.      gabapentin 400 MG capsule   Commonly known as: NEURONTIN   Take 400 mg by mouth 3 (three) times daily.      lisinopril 20 MG tablet   Commonly known as: PRINIVIL,ZESTRIL   Take 20 mg by mouth daily.      nabumetone 750 MG tablet   Commonly known as: RELAFEN   Take 750 mg by mouth 2 (two) times daily.      omeprazole 20 MG tablet   Commonly known as: PRILOSEC OTC   Take 20 mg by mouth every other day.      oxyCODONE-acetaminophen 10-325 MG per tablet   Commonly known as: PERCOCET   Take 1 tablet by mouth every 4 (four) hours as needed for pain.      oxyCODONE-acetaminophen 5-325 MG per tablet   Commonly known as: PERCOCET/ROXICET   Take 1 tablet by mouth every 4 (four) hours as needed. For pain      rosuvastatin 10 MG tablet   Commonly known as: CRESTOR   Take 10 mg by mouth daily.         SignedStefani Dama 11/13/2012, 9:16 AM

## 2012-11-13 NOTE — Progress Notes (Signed)
Utilization review completed. Sokha Craker, RN, BSN. 

## 2013-05-08 ENCOUNTER — Other Ambulatory Visit: Payer: Self-pay | Admitting: Neurological Surgery

## 2013-05-08 ENCOUNTER — Other Ambulatory Visit (HOSPITAL_COMMUNITY): Payer: Self-pay | Admitting: Neurological Surgery

## 2013-05-08 DIAGNOSIS — M5000 Cervical disc disorder with myelopathy, unspecified cervical region: Secondary | ICD-10-CM

## 2013-05-20 ENCOUNTER — Other Ambulatory Visit (HOSPITAL_COMMUNITY): Payer: Self-pay | Admitting: Neurological Surgery

## 2013-05-20 ENCOUNTER — Ambulatory Visit (HOSPITAL_COMMUNITY)
Admission: RE | Admit: 2013-05-20 | Discharge: 2013-05-20 | Disposition: A | Discharge status: Home / Self Care | Payer: BC Managed Care – PPO | Source: Ambulatory Visit | Attending: Neurological Surgery | Admitting: Neurological Surgery

## 2013-05-20 DIAGNOSIS — M7989 Other specified soft tissue disorders: Secondary | ICD-10-CM | POA: Insufficient documentation

## 2013-05-20 DIAGNOSIS — I10 Essential (primary) hypertension: Secondary | ICD-10-CM | POA: Insufficient documentation

## 2013-05-20 DIAGNOSIS — M4712 Other spondylosis with myelopathy, cervical region: Secondary | ICD-10-CM | POA: Insufficient documentation

## 2013-05-20 DIAGNOSIS — Z981 Arthrodesis status: Secondary | ICD-10-CM | POA: Insufficient documentation

## 2013-05-20 DIAGNOSIS — M5000 Cervical disc disorder with myelopathy, unspecified cervical region: Secondary | ICD-10-CM

## 2013-05-20 DIAGNOSIS — R209 Unspecified disturbances of skin sensation: Secondary | ICD-10-CM | POA: Insufficient documentation

## 2013-05-20 DIAGNOSIS — IMO0002 Reserved for concepts with insufficient information to code with codable children: Secondary | ICD-10-CM | POA: Insufficient documentation

## 2013-05-20 MED ORDER — ONDANSETRON HCL 4 MG/2ML IJ SOLN: 4.0000 mg | Freq: Four times a day (QID) | INTRAMUSCULAR | Status: DC | PRN

## 2013-05-20 MED ORDER — HYDROCODONE-ACETAMINOPHEN 5-325 MG PO TABS: 1.0000 | ORAL_TABLET | ORAL | Status: DC | PRN

## 2013-05-20 MED ORDER — DIAZEPAM 5 MG PO TABS
10.0000 mg | ORAL_TABLET | Freq: Once | ORAL | Status: AC
  Administered 2013-05-20: 10 mg via ORAL

## 2013-05-20 MED ORDER — DIAZEPAM 5 MG PO TABS
ORAL_TABLET | ORAL | Status: AC
  Filled 2013-05-20: qty 2

## 2013-05-20 MED ORDER — IOHEXOL 300 MG/ML  SOLN: 10.0000 mL | Freq: Once | INTRAMUSCULAR | Status: DC | PRN

## 2013-05-20 NOTE — Procedures (Signed)
James Barber. James Barber  #[id number] DOB:  [birth year]  November 05, 2012  CHIEF COMPLAINT:   Pinched nerve with weakness in the right hand.  HISTORY OF PRESENT ILLNESS:  James Barber is a 67 year old right-handed individual who has had some previous cervical spondylitic disease and herniated nucleus pulposus that has been operated on four separate occasions by Dr. Emeline General.  He has had an arthrodesis at C4-5 and C5-6, and he has had some posterior decompressive surgery also.  His last operation was at least 10 years ago.  He notes that he recovered well after a period of time and has done reasonably well with his neck.  Several months ago, he started to develop rather acutely a crick in the back of his neck between his shoulder blades and he notes it has progressed now to develop some weakness into the right hand particularly.  He finds that he has weak grip strength in the right hand and he has a pain that radiates from the back of the neck across the region of the supraspinatus muscle into the extensor surface of the arm into the extensor aspect of the forearm and into the hand.  He gets some numbness in his thumb and in his pinkie finger.  His biggest concern, however, is that his grip strength has deteriorated substantially.  He has been seen and treated by Dr. Nicki Reaper but because he has had persistent and evolving weakness, he was sent for an MRI at [facility].  This study is reviewed in the office today and it demonstrates that the patient has the previous arthrodesis at C4-5 and C5-6.  He does have some spondylitic disease at C2-3 and 3-4 with some effacement of the ventral aspect of the spinal cord but the foramen appear quite adequately patent.  At C6-7, he has marked degenerative changes in the disc space with biforaminal stenosis more severe on the right than on the left.  At C7-T1 he has advanced spondylitic disease also with severe biforaminal stenosis on both sides.    PAST MEDICAL HISTORY:  His  general health has been good.  He does have some hypertension.  Aside from the neck surgeries, he has also had a hernia operation in the past.  SOCIAL HISTORY:    He does not smoke or use alcohol.  Height and weight have been stable at 6' and 200 pounds.    REVIEW OF SYSTEMS:   Notable only for the items in the History of Present Illness.  CURRENT MEDICATIONS:  Antihypertensive medication 20 mg. q.d., Crestor 10 mg. q.d., Neurontin 3600 mg. q.d., Nabumetone 750 mg. q.d., 81 mg. aspirin per day and iron sulfate supplement.   James Barber  #[id number] DOB:  05-28-1946   November 05, 2012 Page 2  PHYSICAL EXAMINATION:  His physical exam reveals he has turning of approximately 45 degrees to the right and 60 degrees to the left.  He flexes and extends quite causally on plain radiographs.  His motor function appears good in the deltoid, biceps, triceps bilaterally.  The wrist extensors appear to be quite intact bilaterally; however, the finger extensors on the right are weak at 4-/5 and the intrinsic function on the right is weak at 3/5.  There is some early evidence of atrophy of the interossei on the right hand compared to the left.  Left upper extremity strength is all intact.    Today in the office to further his workup I obtained lateral flexion and extension radiographs.  This is  to check the integrity of his previously placed fusions and to also examine the anatomy of C6-7 and C7-T1 to see if anterior cervical approach would be available conventionally to the C7-T1 levels.  Two radiographs are examined and these demonstrate the patient has a long neck with a low hanging clavicle and manubrium allowing each access to C7-T1.  There is advanced spondylitic disease closing off the disc space anterior at C7-T1.  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  Previous fusions at C4-5 and 5-6 are well healed.  The patient does have a  straight neck with loss of normal lordotic curve noted.  Movement between flexion and extension is quite ample at all levels particularly at the lower cervical levels of C7-T1.    IMPRESSION and PLAN:   The patient has evidence of significant weakness in the intrinsic muscles and in the finger extensors on the right side compared to the left.  Review of his MRI demonstrates he has advanced spondylitic disease at C6-7 and C7-T1 with right-sided foraminal stenosis worse than left at C6-7 and biforaminal stenosis at C7-T1.    The patient has advanced spondylitic disease at C6-7 and C7-T1.  Ultimately he will need to have this area decompressed and stabilized.  It will be done via an anterior approach in his situation from the right side as he notes that he has had previous anterior approaches by Dr. Emeline General.  He did have a period of some voice change after his last operation with some hoarseness of voice for several months' time.  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  I have advised that the two levels need to be decompressed even though more recently he has felt some slight alleviation of the weakness and alleviation of the pain since he has been on a 10-day steroid dose course.  I would advise that we do not use any further steroid medications.  Because of the advanced nature of the anatomic findings, anterior decompression and arthrodesis would be in his best interest to preserve the function of his nervous system.    I have advised that we proceed with surgery sometime at his earliest convenience and we will schedule this for him.  I discussed the major risks of the surgery which include but are not limited to the possibility of nerve injury, difficulties with swallowing, injury to the nerve to the voice box which could cause hoarseness or difficulty with swallowing, and of course the long term necessity that he heal this fusion or  with a nonunion he could have some significant pain  James Barber  #[id number] DOB:  [birth year]   November 05, 2012 Page 3  that results in the need for further surgery.  All of these are discussed and considered; however, I am hopeful that we will be able to get him through the surgery with good relief and hopefully some return of function in his right upper extremity.    We will schedule at his earliest convenience.  [facility]         James Barber, M.D.  HJE/gde  cc: Rayna Sexton, [address], [place], [place]  [address]  Dr. [name]  Dr. August Saucer Meylor  HOPI:  James Barber returns to the office today for review of an MRI of his cervical spine. James Barber has had  decompression at C6-7 and C7-T1 back in December. He had intrinsic weakness of the right hand and this has slowly been getting better, but I suggested a followup study about six months after the surgery. Today in the office, I reviewed the MRI that demonstrates James Barber has a significant stenosis at C6-C7 level due to a combination of hypertrophy of the posterior interspinous ligament and some changes at the level of the disc space that was working out as bone graft. Whether this is artifactual or there is actually some inflammatory tissue or soft tissue growth in this region, is difficult to tell on the basis of the MRI, but it clearly looks worse than his preoperative study at C6-C7. C7-T1, where he did have a significant stenosis preoperatively, actually looks better. 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 IMPRESSION/PLAN: In light of the findings on this study, I believe that James Barber ultimately needs to undergo a myelogram and a post myelogram CAT scan. If indeed the stenosis at C6-7 is worse, it needs to be addressed. If not, then we can continue  to observe and treat him conservatively. However, the picture that we have postoperatively at C6-C7 is bothersome. We will plan on scheduling a myelogram at the earliest convenience.  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

## 2015-03-14 ENCOUNTER — Inpatient Hospital Stay (HOSPITAL_BASED_OUTPATIENT_CLINIC_OR_DEPARTMENT_OTHER)
Admission: EM | Admit: 2015-03-14 | Discharge: 2015-03-20 | DRG: 390 | Disposition: A | Discharge status: Home / Self Care | Payer: Medicare Other | Attending: Surgery | Admitting: Surgery

## 2015-03-14 ENCOUNTER — Encounter (HOSPITAL_BASED_OUTPATIENT_CLINIC_OR_DEPARTMENT_OTHER): Payer: Self-pay

## 2015-03-14 ENCOUNTER — Emergency Department (HOSPITAL_BASED_OUTPATIENT_CLINIC_OR_DEPARTMENT_OTHER): Payer: Medicare Other

## 2015-03-14 DIAGNOSIS — Z794 Long term (current) use of insulin: Secondary | ICD-10-CM

## 2015-03-14 DIAGNOSIS — Z0189 Encounter for other specified special examinations: Secondary | ICD-10-CM

## 2015-03-14 DIAGNOSIS — R52 Pain, unspecified: Secondary | ICD-10-CM

## 2015-03-14 DIAGNOSIS — K219 Gastro-esophageal reflux disease without esophagitis: Secondary | ICD-10-CM | POA: Diagnosis present

## 2015-03-14 DIAGNOSIS — K5669 Other intestinal obstruction: Secondary | ICD-10-CM | POA: Diagnosis not present

## 2015-03-14 DIAGNOSIS — K5660 Unspecified intestinal obstruction: Principal | ICD-10-CM | POA: Diagnosis present

## 2015-03-14 DIAGNOSIS — K56609 Unspecified intestinal obstruction, unspecified as to partial versus complete obstruction: Secondary | ICD-10-CM

## 2015-03-14 DIAGNOSIS — E119 Type 2 diabetes mellitus without complications: Secondary | ICD-10-CM | POA: Diagnosis present

## 2015-03-14 DIAGNOSIS — I10 Essential (primary) hypertension: Secondary | ICD-10-CM | POA: Diagnosis present

## 2015-03-14 HISTORY — DX: Type 2 diabetes mellitus without complications: E11.9

## 2015-03-14 HISTORY — DX: Unspecified intestinal obstruction, unspecified as to partial versus complete obstruction: K56.609

## 2015-03-14 LAB — COMPREHENSIVE METABOLIC PANEL
ALBUMIN: 4.1 g/dL (ref 3.5–5.2)
ALK PHOS: 73 U/L (ref 39–117)
ALT: 53 U/L (ref 0–53)
ANION GAP: 10 (ref 5–15)
AST: 25 U/L (ref 0–37)
BUN: 19 mg/dL (ref 6–23)
CO2: 27 mmol/L (ref 19–32)
Calcium: 9.1 mg/dL (ref 8.4–10.5)
Chloride: 97 mmol/L (ref 96–112)
Creatinine, Ser: 1.15 mg/dL (ref 0.50–1.35)
GFR calc non Af Amer: 65 mL/min — ABNORMAL LOW (ref >90)
GFR, EST AFRICAN AMERICAN: 75 mL/min — AB (ref >90)
GLUCOSE: 180 mg/dL — AB (ref 70–99)
Potassium: 4.2 mmol/L (ref 3.5–5.1)
SODIUM: 134 mmol/L — AB (ref 135–145)
TOTAL PROTEIN: 7.2 g/dL (ref 6.0–8.3)
Total Bilirubin: 1.1 mg/dL (ref 0.3–1.2)

## 2015-03-14 LAB — CBC WITH DIFFERENTIAL/PLATELET
BASOS ABS: 0 10*3/uL (ref 0.0–0.1)
Basophils Relative: 0 % (ref 0–1)
EOS ABS: 0.1 10*3/uL (ref 0.0–0.7)
EOS PCT: 1 % (ref 0–5)
HEMATOCRIT: 45.8 % (ref 39.0–52.0)
Hemoglobin: 15.3 g/dL (ref 13.0–17.0)
LYMPHS ABS: 0.7 10*3/uL (ref 0.7–4.0)
LYMPHS PCT: 6 % — AB (ref 12–46)
MCH: 31.7 pg (ref 26.0–34.0)
MCHC: 33.4 g/dL (ref 30.0–36.0)
MCV: 94.8 fL (ref 78.0–100.0)
MONOS PCT: 5 % (ref 3–12)
Monocytes Absolute: 0.6 10*3/uL (ref 0.1–1.0)
NEUTROS PCT: 88 % — AB (ref 43–77)
Neutro Abs: 10.8 10*3/uL — ABNORMAL HIGH (ref 1.7–7.7)
PLATELETS: 265 10*3/uL (ref 150–400)
RBC: 4.83 MIL/uL (ref 4.22–5.81)
RDW: 13.5 % (ref 11.5–15.5)
WBC: 12.3 10*3/uL — ABNORMAL HIGH (ref 4.0–10.5)

## 2015-03-14 LAB — LIPASE, BLOOD: Lipase: 21 U/L (ref 11–59)

## 2015-03-14 MED ORDER — SODIUM CHLORIDE 0.9 % IV BOLUS (SEPSIS)
1000.0000 mL | Freq: Once | INTRAVENOUS | Status: AC
Start: 2015-03-14 — End: 2015-03-15
  Administered 2015-03-14: 1000 mL via INTRAVENOUS

## 2015-03-14 MED ORDER — ONDANSETRON HCL 4 MG/2ML IJ SOLN
4.0000 mg | Freq: Once | INTRAMUSCULAR | Status: AC
  Administered 2015-03-14: 4 mg via INTRAVENOUS
  Filled 2015-03-14: qty 2

## 2015-03-14 MED ORDER — MORPHINE SULFATE 4 MG/ML IJ SOLN
4.0000 mg | Freq: Once | INTRAMUSCULAR | Status: AC
  Administered 2015-03-14: 4 mg via INTRAVENOUS
  Filled 2015-03-14: qty 1

## 2015-03-14 NOTE — ED Provider Notes (Signed)
CSN: [id number]     Arrival date & time 03/14/15  2117 History  This chart was scribed for Shalanda Brogden, MD by Tonye Royalty, ED Scribe. This patient was seen in room MH03/MH03 and the patient's care was started at 11:30 PM.    Chief Complaint  Patient presents with  . Abdominal Pain   Patient is a 69 y.o. male presenting with abdominal pain. The history is provided by the patient. No language interpreter was used.  Abdominal Pain Pain location:  Generalized Pain quality: bloating   Pain radiates to:  Does not radiate Pain severity:  Mild Onset quality:  Sudden Duration:  1 day Timing:  Constant Progression:  Worsening Chronicity:  New Context: previous surgery   Relieved by:  Position changes Worsened by:  Nothing tried Ineffective treatments:  None tried Associated symptoms: nausea   Associated symptoms: no constipation, no diarrhea, no dysuria and no vomiting   Risk factors: multiple surgeries     HPI Comments: James Barber is a 69 y.o. male with history of SBO who presents to the Emergency Department complaining of abdominal distension and  abdominal pain with onset earlier today. He reports associated nausea. He states last BM was last night and normal. He has not had BM but notes he has only eaten some toast today. He states it improves with positional change. He denies using any medication for it.  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 He denies vomiting, dysuria, or burning with eating.  Past Medical History  Diagnosis Date  . Hypertension   . GERD (gastroesophageal reflux disease)     takes  otc every  other day  . Bowel obstruction   . Diabetes mellitus without complication    Past Surgical History  Procedure Laterality Date  . Abdominal surgery    . Back surgery    . Cervical fusion      c 3-4, C 4-5, C5-6  . Hernia repair      right inguinal  .  Anterior cervical decomp/discectomy fusion  11/12/2012    Procedure: ANTERIOR CERVICAL DECOMPRESSION/DISCECTOMY FUSION 2 LEVELS;  Surgeon: Barnett Abu, MD;  Location: [facility];  Service: Neurosurgery;  Laterality: N/A;  Right Side approach Cervical six-seven,Cervical seven -thoracic one Anterior cervical decompression/diskectomy/fusion   No family history on file. History  Substance Use Topics  . Smoking status: Never Smoker   . Smokeless tobacco: Not on file  . Alcohol Use: No    Review of Systems  Constitutional: Positive for appetite change.  Gastrointestinal: Positive for nausea, abdominal pain and abdominal distention. Negative for vomiting, diarrhea and constipation.  Genitourinary: Negative for dysuria.  All other systems reviewed and are negative.     Allergies  Review of patient's allergies indicates no known allergies.  Home Medications   Prior to Admission medications   Medication Sig Start Date End Date Taking? Authorizing Provider  levothyroxine (SYNTHROID, LEVOTHROID) 50 MCG tablet Take 50 mcg by mouth daily before breakfast.   Yes Historical Provider, MD  metFORMIN (GLUCOPHAGE) 500 MG tablet Take by mouth 2 (two) times daily with a meal.   Yes Historical Provider, MD  aspirin EC 81 MG tablet Take 81 mg by mouth daily.    Historical Provider, MD  cyclobenzaprine (FLEXERIL) 10 MG tablet Take 10 mg by mouth daily as needed for muscle spasms.    Historical Provider, MD  DiphenhydrAMINE HCl (BENADRYL PO) Take 1 tablet by mouth daily as needed (for drainage).    Historical Provider, MD  ferrous sulfate 325 (65 FE) MG tablet Take 325 mg by mouth daily with breakfast.    Historical Provider, MD  gabapentin (NEURONTIN) 400 MG capsule Take 1,200 mg by mouth 3 (three) times daily.     Historical Provider, MD  lisinopril (PRINIVIL,ZESTRIL) 20 MG tablet Take 20 mg by mouth daily.    Historical Provider, MD  nabumetone (RELAFEN) 750 MG tablet Take 750 mg by mouth 2 (two)  times daily.    Historical Provider, MD  omeprazole (PRILOSEC OTC) 20 MG tablet Take 20 mg by mouth every other day.    Historical Provider, MD  oxyCODONE-acetaminophen (PERCOCET/ROXICET) 5-325 MG per tablet Take 1 tablet by mouth every 4 (four) hours as needed for pain.    Historical Provider, MD  Pseudoephedrine HCl (SUDAFED PO) Take 1 tablet by mouth daily as needed (for drainage).    Historical Provider, MD  rosuvastatin (CRESTOR) 10 MG tablet Take 10 mg by mouth daily.    Historical Provider, MD   BP 142/84 mmHg  Pulse 108  Temp(Src) 98.3 F (36.8 C) (Oral)  Resp 18  Ht 6' (1.829 m)  Wt 200 lb (90.719 kg)  BMI 27.12 kg/m2  SpO2 95% Physical Exam  Constitutional: He is oriented to person, place, and time. He appears well-developed and well-nourished. No distress.  HENT:  Head: Normocephalic and atraumatic.  Mouth/Throat: Oropharynx is clear and moist.  Eyes: Conjunctivae are normal.  Neck: Normal range of motion. Neck supple.  Cardiovascular: Normal rate and regular rhythm.   Pulmonary/Chest: Effort normal and breath sounds normal. No respiratory distress. He has no wheezes. He has no rales.  Abdominal: Soft. He exhibits distension. There is tenderness. There is no rebound and no guarding.  Hyperactive bowel sounds most prominent in epigastrium  Musculoskeletal: Normal range of motion.  Neurological: He is alert and oriented to person, place, and time.  Skin: Skin is warm and dry.  Psychiatric: He has a normal mood and affect.  Nursing note and vitals reviewed.   ED Course  Procedures (including critical care time)  DIAGNOSTIC STUDIES: Oxygen Saturation is 99% on room air, normal by my interpretation.    COORDINATION OF CARE: 11:37 PM Discussed treatment plan with patient at beside, the patient agrees with the plan and has no further questions at this time.   Labs Review Labs Reviewed  CBC WITH DIFFERENTIAL/PLATELET - Abnormal; Notable for the following:    WBC 12.3  (*)    Neutrophils Relative % 88 (*)    Neutro Abs 10.8 (*)    Lymphocytes Relative 6 (*)    All other components within normal limits  COMPREHENSIVE METABOLIC PANEL - Abnormal; Notable for the following:    Sodium 134 (*)    Glucose, Bld 180 (*)    GFR calc non Af Amer 65 (*)    GFR calc Af Amer 75 (*)    All other components within normal limits  LIPASE, BLOOD    Imaging Review No results found.   EKG Interpretation None      MDM   Final diagnoses:  None    Medications  iohexol (OMNIPAQUE) 300 MG/ML solution 50 mL (not administered)  morphine 4 MG/ML injection 4 mg (4 mg Intravenous Given 03/14/15 2357)  ondansetron (ZOFRAN) injection 4 mg (4 mg Intravenous Given 03/14/15 2357)  sodium chloride 0.9 % bolus 1,000 mL (0 mLs Intravenous Stopped  03/15/15 0050)  iohexol (OMNIPAQUE) 300 MG/ML solution 100 mL (100 mLs Intravenous Contrast Given 03/15/15 0101)  iohexol (OMNIPAQUE) 300 MG/ML solution 50 mL (50 mLs Oral Contrast Given 03/14/15 2340)  lidocaine (XYLOCAINE) 2 % jelly 1 application (1 application Other Given 03/15/15 0349)  oxymetazoline (AFRIN) 0.05 % nasal spray 1 spray (1 spray Each Nare Given 03/15/15 0349)   Results for orders placed or performed during the hospital encounter of 03/14/15  CBC with Differential  Result Value Ref Range   WBC 12.3 (H) 4.0 - 10.5 K/uL   RBC 4.83 4.22 - 5.81 MIL/uL   Hemoglobin 15.3 13.0 - 17.0 g/dL   HCT 04.545.8 40.939.0 - 81.152.0 %   MCV 94.8 78.0 - 100.0 fL   MCH 31.7 26.0 - 34.0 pg   MCHC 33.4 30.0 - 36.0 g/dL   RDW 91.413.5 78.211.5 - 95.615.5 %   Platelets 265 150 - 400 K/uL   Neutrophils Relative % 88 (H) 43 - 77 %   Neutro Abs 10.8 (H) 1.7 - 7.7 K/uL   Lymphocytes Relative 6 (L) 12 - 46 %   Lymphs Abs 0.7 0.7 - 4.0 K/uL   Monocytes Relative 5 3 - 12 %   Monocytes Absolute 0.6 0.1 - 1.0 K/uL   Eosinophils Relative 1 0 - 5 %   Eosinophils Absolute 0.1 0.0 - 0.7 K/uL   Basophils Relative 0 0 - 1 %   Basophils Absolute 0.0 0.0 - 0.1 K/uL   Comprehensive metabolic panel  Result Value Ref Range   Sodium 134 (L) 135 - 145 mmol/L   Potassium 4.2 3.5 - 5.1 mmol/L   Chloride 97 96 - 112 mmol/L   CO2 27 19 - 32 mmol/L   Glucose, Bld 180 (H) 70 - 99 mg/dL   BUN 19 6 - 23 mg/dL   Creatinine, Ser 2.131.15 0.50 - 1.35 mg/dL   Calcium 9.1 8.4 - 08.610.5 mg/dL   Total Protein 7.2 6.0 - 8.3 g/dL   Albumin 4.1 3.5 - 5.2 g/dL   AST 25 0 - 37 U/L   ALT 53 0 - 53 U/L   Alkaline Phosphatase 73 39 - 117 U/L   Total Bilirubin 1.1 0.3 - 1.2 mg/dL   GFR calc non Af Amer 65 (L) >90 mL/min   GFR calc Af Amer 75 (L) >90 mL/min   Anion gap 10 5 - 15  Lipase, blood  Result Value Ref Range   Lipase 21 11 - 59 U/L   Ct Abdomen Pelvis W Contrast  03/15/2015   CLINICAL DATA:  Abdominal distention and upper abdominal pain.  EXAM: CT ABDOMEN AND PELVIS WITH CONTRAST  TECHNIQUE: Multidetector CT imaging of the abdomen and pelvis was performed using the standard protocol following bolus administration of intravenous contrast.  CONTRAST:  50mL OMNIPAQUE IOHEXOL 300 MG/ML SOLN, 100mL OMNIPAQUE IOHEXOL 300 MG/ML SOLN  COMPARISON:  None.  FINDINGS: BODY WALL: Fatty periumbilical hernia. There is a left inguinal hernia containing fat and ascitic fluid.  LOWER CHEST: No contributory findings.  ABDOMEN/PELVIS:  Liver: No focal abnormality.  Biliary: No evidence of biliary obstruction or stone.  Pancreas: Unremarkable.  Spleen: Unremarkable.  Adrenals: Unremarkable.  Kidneys and ureters: No hydronephrosis or stone. Small renal sinus cysts bilaterally.  Bladder: Unremarkable.  Reproductive: No pathologic findings.  Bowel: There is bowel distention and fluid levels leading to the deep left abdomen where there is transition point to decompressed ileum. The transition point is just beyond an enteroenteric anastomosis which is aneurysmally  dilatated and contains a fluid level. No evidence of bowel necrosis or perforation. Suspect appendectomy. No pericecal inflammation. Mild  scattered colonic diverticulosis.  Retroperitoneum: No mass or adenopathy.  Peritoneum: Small reactive pelvic ascites.  Vascular: No acute abnormality.  OSSEOUS: No acute abnormalities.  IMPRESSION: 1. Distal small bowel obstruction near an enteroenteric anastomosis. 2. Periumbilical and left inguinal fatty hernias.   Electronically Signed   By: Marnee Spring M.D.   On: 03/15/2015 02:23    Per Dr. Derrell Lolling admit to his service Med surg bed via [facility]     I personally performed the services described in this documentation, which was scribed in my presence. The recorded information has been reviewed and is accurate.    Cy Blamer, MD 03/15/15 [phone]

## 2015-03-14 NOTE — ED Notes (Signed)
No changes. Alert, NAD, calm, interactive, drinking contrast, VSS.

## 2015-03-14 NOTE — ED Notes (Signed)
Pt reports today with abd distention and discomfort.  Last normal bm last night, no diarrhea or vomiting but reports nausea.  Hx of sbo in the lower intestine.

## 2015-03-15 ENCOUNTER — Observation Stay (HOSPITAL_COMMUNITY): Payer: Medicare Other

## 2015-03-15 ENCOUNTER — Inpatient Hospital Stay (HOSPITAL_COMMUNITY): Payer: Medicare Other

## 2015-03-15 ENCOUNTER — Encounter (HOSPITAL_BASED_OUTPATIENT_CLINIC_OR_DEPARTMENT_OTHER): Payer: Self-pay | Admitting: Emergency Medicine

## 2015-03-15 DIAGNOSIS — K5669 Other intestinal obstruction: Secondary | ICD-10-CM | POA: Diagnosis present

## 2015-03-15 DIAGNOSIS — K5660 Unspecified intestinal obstruction: Secondary | ICD-10-CM | POA: Diagnosis present

## 2015-03-15 DIAGNOSIS — K56609 Unspecified intestinal obstruction, unspecified as to partial versus complete obstruction: Secondary | ICD-10-CM | POA: Diagnosis present

## 2015-03-15 DIAGNOSIS — E119 Type 2 diabetes mellitus without complications: Secondary | ICD-10-CM | POA: Diagnosis present

## 2015-03-15 DIAGNOSIS — Z794 Long term (current) use of insulin: Secondary | ICD-10-CM | POA: Diagnosis not present

## 2015-03-15 DIAGNOSIS — K219 Gastro-esophageal reflux disease without esophagitis: Secondary | ICD-10-CM | POA: Diagnosis present

## 2015-03-15 DIAGNOSIS — I10 Essential (primary) hypertension: Secondary | ICD-10-CM | POA: Diagnosis present

## 2015-03-15 LAB — CBC
HEMATOCRIT: 41.5 % (ref 39.0–52.0)
Hemoglobin: 13.9 g/dL (ref 13.0–17.0)
MCH: 32.2 pg (ref 26.0–34.0)
MCHC: 33.5 g/dL (ref 30.0–36.0)
MCV: 96.1 fL (ref 78.0–100.0)
Platelets: 231 10*3/uL (ref 150–400)
RBC: 4.32 MIL/uL (ref 4.22–5.81)
RDW: 13.7 % (ref 11.5–15.5)
WBC: 10.4 10*3/uL (ref 4.0–10.5)

## 2015-03-15 LAB — CREATININE, SERUM
Creatinine, Ser: 1.1 mg/dL (ref 0.50–1.35)
GFR calc non Af Amer: 68 mL/min — ABNORMAL LOW (ref >90)
GFR, EST AFRICAN AMERICAN: 79 mL/min — AB (ref >90)

## 2015-03-15 MED ORDER — KETOROLAC TROMETHAMINE 30 MG/ML IJ SOLN
15.0000 mg | Freq: Three times a day (TID) | INTRAMUSCULAR | Status: AC | PRN
  Administered 2015-03-15 – 2015-03-16 (×2): 15 mg via INTRAVENOUS
  Filled 2015-03-15 (×2): qty 1

## 2015-03-15 MED ORDER — DEXTROSE-NACL 5-0.45 % IV SOLN
INTRAVENOUS | Status: DC
  Administered 2015-03-15 – 2015-03-16 (×2): via INTRAVENOUS
  Administered 2015-03-16: 100 mL/h via INTRAVENOUS

## 2015-03-15 MED ORDER — MORPHINE SULFATE 2 MG/ML IJ SOLN
1.0000 mg | INTRAMUSCULAR | Status: DC | PRN
  Administered 2015-03-15: 4 mg via INTRAVENOUS
  Administered 2015-03-15: 2 mg via INTRAVENOUS
  Administered 2015-03-16 – 2015-03-19 (×4): 4 mg via INTRAVENOUS
  Administered 2015-03-20: 2 mg via INTRAVENOUS
  Filled 2015-03-15 (×4): qty 2
  Filled 2015-03-15 (×2): qty 1
  Filled 2015-03-15: qty 2

## 2015-03-15 MED ORDER — IOHEXOL 300 MG/ML  SOLN
50.0000 mL | Freq: Once | INTRAMUSCULAR | Status: AC | PRN
  Administered 2015-03-14: 50 mL via ORAL

## 2015-03-15 MED ORDER — ENOXAPARIN SODIUM 40 MG/0.4ML ~~LOC~~ SOLN
40.0000 mg | SUBCUTANEOUS | Status: DC
Start: 2015-03-15 — End: 2015-03-20
  Administered 2015-03-15 – 2015-03-20 (×6): 40 mg via SUBCUTANEOUS
  Filled 2015-03-15 (×6): qty 0.4

## 2015-03-15 MED ORDER — OXYMETAZOLINE HCL 0.05 % NA SOLN
1.0000 | Freq: Once | NASAL | Status: AC
  Administered 2015-03-15: 1 via NASAL
  Filled 2015-03-15: qty 15

## 2015-03-15 MED ORDER — IOHEXOL 300 MG/ML  SOLN
100.0000 mL | Freq: Once | INTRAMUSCULAR | Status: AC | PRN
  Administered 2015-03-15: 100 mL via INTRAVENOUS

## 2015-03-15 MED ORDER — DIATRIZOATE MEGLUMINE & SODIUM 66-10 % PO SOLN
90.0000 mL | Freq: Once | ORAL | Status: AC
  Administered 2015-03-15: 90 mL via NASOGASTRIC
  Filled 2015-03-15: qty 90

## 2015-03-15 MED ORDER — LIDOCAINE HCL 2 % EX GEL
1.0000 "application " | Freq: Once | CUTANEOUS | Status: AC
  Administered 2015-03-15: 1
  Filled 2015-03-15: qty 20

## 2015-03-15 MED ORDER — KETOROLAC TROMETHAMINE 30 MG/ML IJ SOLN: 30.0000 mg | Freq: Three times a day (TID) | INTRAMUSCULAR | Status: DC | PRN

## 2015-03-15 MED ORDER — CHLORHEXIDINE GLUCONATE 0.12 % MT SOLN
15.0000 mL | Freq: Two times a day (BID) | OROMUCOSAL | Status: DC
  Administered 2015-03-15 – 2015-03-20 (×8): 15 mL via OROMUCOSAL
  Filled 2015-03-15 (×13): qty 15

## 2015-03-15 MED ORDER — PANTOPRAZOLE SODIUM 40 MG IV SOLR
40.0000 mg | Freq: Two times a day (BID) | INTRAVENOUS | Status: DC
  Administered 2015-03-15 – 2015-03-20 (×11): 40 mg via INTRAVENOUS
  Filled 2015-03-15 (×12): qty 40

## 2015-03-15 MED ORDER — ONDANSETRON HCL 4 MG/2ML IJ SOLN
4.0000 mg | Freq: Four times a day (QID) | INTRAMUSCULAR | Status: DC | PRN
  Administered 2015-03-16 – 2015-03-18 (×2): 4 mg via INTRAVENOUS
  Filled 2015-03-15 (×4): qty 2

## 2015-03-15 MED ORDER — ENOXAPARIN SODIUM 40 MG/0.4ML ~~LOC~~ SOLN: 40.0000 mg | SUBCUTANEOUS | Status: DC

## 2015-03-15 MED ORDER — IOHEXOL 300 MG/ML  SOLN: 50.0000 mL | Freq: Once | INTRAMUSCULAR | Status: AC | PRN

## 2015-03-15 NOTE — ED Notes (Signed)
[facility] here, no changes, VSS.  

## 2015-03-15 NOTE — ED Notes (Signed)
Pt in CT, tolerating well, NAD, calm, interactive.

## 2015-03-15 NOTE — H&P (Signed)
James Barber is an 69 y.o. male.   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  He subsequently had a SBO which required surgical repair ~ 25 yrs ago.  He comes in tonight with ongoing abd pain and n/v.  He presented to the ED for evaluation.  He underwent CT scan and lab studies which revealed an elev WBC and CT c/w SBO distal to a previous anastamosis.  Past Medical History  Diagnosis Date  . Hypertension   . GERD (gastroesophageal reflux disease)     takes  otc every  other day  . Bowel obstruction   . Diabetes mellitus without complication     Past Surgical History  Procedure Laterality Date  . Abdominal surgery    . Back surgery    . Cervical fusion      c 3-4, C 4-5, C5-6  . Hernia repair      right inguinal  . Anterior cervical decomp/discectomy fusion  11/12/2012    Procedure: ANTERIOR CERVICAL DECOMPRESSION/DISCECTOMY FUSION 2 LEVELS;  Surgeon: Kristeen Miss, MD;  Location: [facility];  Service: Neurosurgery;  Laterality: N/A;  Right Side approach Cervical six-seven,Cervical seven -thoracic one Anterior cervical decompression/diskectomy/fusion    History reviewed. No pertinent family history. Social History:  reports that he has never smoked. He does not have any smokeless tobacco history on file. He reports that he does not drink alcohol or use illicit drugs.  Allergies: No Known Allergies   (Not in a hospital admission)  Results for orders placed or performed during the hospital encounter of 03/14/15 (from the past 48 hour(s))  CBC with Differential     Status: Abnormal   Collection Time: 03/14/15 10:56 PM  Result Value Ref Range   WBC 12.3 (H) 4.0 - 10.5 K/uL   RBC 4.83 4.22 - 5.81 MIL/uL   Hemoglobin 15.3 13.0 - 17.0 g/dL   HCT 45.8 39.0 - 52.0 %   MCV 94.8 78.0 - 100.0 fL   MCH 31.7 26.0 - 34.0 pg   MCHC 33.4 30.0 - 36.0 g/dL   RDW 13.5 11.5 - 15.5 %   Platelets 265  150 - 400 K/uL   Neutrophils Relative % 88 (H) 43 - 77 %   Neutro Abs 10.8 (H) 1.7 - 7.7 K/uL   Lymphocytes Relative 6 (L) 12 - 46 %   Lymphs Abs 0.7 0.7 - 4.0 K/uL   Monocytes Relative 5 3 - 12 %   Monocytes Absolute 0.6 0.1 - 1.0 K/uL   Eosinophils Relative 1 0 - 5 %   Eosinophils Absolute 0.1 0.0 - 0.7 K/uL   Basophils Relative 0 0 - 1 %   Basophils Absolute 0.0 0.0 - 0.1 K/uL  Comprehensive metabolic panel     Status: Abnormal   Collection Time: 03/14/15 10:56 PM  Result Value Ref Range   Sodium 134 (L) 135 - 145 mmol/L   Potassium 4.2 3.5 - 5.1 mmol/L   Chloride 97 96 - 112 mmol/L   CO2 27 19 - 32 mmol/L   Glucose, Bld 180 (H) 70 - 99 mg/dL   BUN 19 6 - 23 mg/dL   Creatinine, Ser 1.15 0.50 - 1.35 mg/dL   Calcium 9.1 8.4 - 10.5 mg/dL   Total Protein 7.2 6.0 - 8.3 g/dL   Albumin 4.1 3.5 - 5.2 g/dL   AST 25 0 - 37 U/L  ALT 53 0 - 53 U/L   Alkaline Phosphatase 73 39 - 117 U/L   Total Bilirubin 1.1 0.3 - 1.2 mg/dL   GFR calc non Af Amer 65 (L) >90 mL/min   GFR calc Af Amer 75 (L) >90 mL/min    Comment: (NOTE) The eGFR has been calculated using the CKD EPI equation. This calculation has not been validated in all clinical situations. eGFR's persistently <90 mL/min signify possible Chronic Kidney Disease.    Anion gap 10 5 - 15  Lipase, blood     Status: None   Collection Time: 03/14/15 10:56 PM  Result Value Ref Range   Lipase 21 11 - 59 U/L   Ct Abdomen Pelvis W Contrast  03/15/2015   CLINICAL DATA:  Abdominal distention and upper abdominal pain.  EXAM: CT ABDOMEN AND PELVIS WITH CONTRAST  TECHNIQUE: Multidetector CT imaging of the abdomen and pelvis was performed using the standard protocol following bolus administration of intravenous contrast.  CONTRAST:  23m OMNIPAQUE IOHEXOL 300 MG/ML SOLN, 1058mOMNIPAQUE IOHEXOL 300 MG/ML SOLN  COMPARISON:  None.  FINDINGS: BODY WALL: Fatty periumbilical hernia. There is a left inguinal hernia containing fat and ascitic fluid.   LOWER CHEST: No contributory findings.  ABDOMEN/PELVIS:  Liver: No focal abnormality.  Biliary: No evidence of biliary obstruction or stone.  Pancreas: Unremarkable.  Spleen: Unremarkable.  Adrenals: Unremarkable.  Kidneys and ureters: No hydronephrosis or stone. Small renal sinus cysts bilaterally.  Bladder: Unremarkable.  Reproductive: No pathologic findings.  Bowel: There is bowel distention and fluid levels leading to the deep left abdomen where there is transition point to decompressed ileum. 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 No evidence of bowel necrosis or perforation. Suspect appendectomy. No pericecal inflammation. Mild scattered colonic diverticulosis.  Retroperitoneum: No mass or adenopathy.  Peritoneum: Small reactive pelvic ascites.  Vascular: No acute abnormality.  OSSEOUS: No acute abnormalities.  IMPRESSION: 1. Distal small bowel obstruction near an enteroenteric anastomosis. 2. Periumbilical and left inguinal fatty hernias.   Electronically Signed   By: JoMonte Fantasia.D.   On: 03/15/2015 02:23    Review of Systems  Constitutional: Negative for weight loss.  HENT: Negative for ear discharge, ear pain, hearing loss and tinnitus.   Eyes: Negative for blurred vision, double vision, photophobia and pain.  Respiratory: Negative for cough, sputum production and shortness of breath.   Cardiovascular: Negative for chest pain.  Gastrointestinal: Positive for nausea, vomiting and abdominal pain.  Genitourinary: Negative for dysuria, urgency, frequency and flank pain.  Musculoskeletal: Negative for myalgias, back pain, joint pain, falls and neck pain.  Neurological: Negative for dizziness, tingling, sensory change, focal weakness, loss of consciousness and headaches.  Endo/Heme/Allergies: Does not bruise/bleed easily.  Psychiatric/Behavioral: Negative for depression, memory loss and substance abuse. The  patient is not nervous/anxious.     Blood pressure 130/76, pulse 93, temperature 97.7 F (36.5 C), temperature source Oral, resp. rate 18, height 6' (1.829 m), weight 90.719 kg (200 lb), SpO2 94 %. Physical Exam  Vitals reviewed. Constitutional: He is oriented to person, place, and time. He appears well-developed and well-nourished. He is cooperative. No distress. Cervical collar and nasal cannula in place.  HENT:  Head: Normocephalic and atraumatic. Head is without raccoon's eyes, without Battle's sign, without abrasion, without contusion and without laceration.  Right Ear: Hearing, tympanic membrane, external ear and ear canal normal. No lacerations. No drainage or tenderness. No foreign bodies. Tympanic  membrane is not perforated. No hemotympanum.  Left Ear: Hearing, tympanic membrane, external ear and ear canal normal. No lacerations. No drainage or tenderness. No foreign bodies. Tympanic membrane is not perforated. No hemotympanum.  Nose: Nose normal. No nose lacerations, sinus tenderness, nasal deformity or nasal septal hematoma. No epistaxis.  Mouth/Throat: Uvula is midline, oropharynx is clear and moist and mucous membranes are normal. No lacerations.  Eyes: Conjunctivae, EOM and lids are normal. Pupils are equal, round, and reactive to light. No scleral icterus.  Neck: Trachea normal. No JVD present. No spinous process tenderness and no muscular tenderness present. Carotid bruit is not present. No thyromegaly present.  Cardiovascular: Normal rate, regular rhythm, normal heart sounds, intact distal pulses and normal pulses.   Respiratory: Effort normal and breath sounds normal. No respiratory distress. He exhibits no tenderness, no bony tenderness, no laceration and no crepitus.  GI: Soft. Normal appearance. He exhibits distension. Bowel sounds are decreased. There is no tenderness (no signs of peritonitis). There is no rigidity, no rebound, no guarding and no CVA tenderness.   Musculoskeletal: Normal range of motion. He exhibits no edema or tenderness.  Lymphadenopathy:    He has no cervical adenopathy.  Neurological: He is alert and oriented to person, place, and time. He has normal strength. No cranial nerve deficit or sensory deficit. GCS eye subscore is 4. GCS verbal subscore is 5. GCS motor subscore is 6.  Skin: Skin is warm, dry and intact. He is not diaphoretic.  Psychiatric: He has a normal mood and affect. His speech is normal and behavior is normal.     Assessment/Plan 69 y/o M with SBO, and DM.  I discussed with the patient that we would proceed with SBO protocol. If SBO not able to managed non-operatively I d/w him that he would likely require surgical mgmt. 1. Initiate SBO protocol 2. NPO 3. IVF  Rosario Jacks., [place] 03/15/2015, 3:25 AM

## 2015-03-15 NOTE — Progress Notes (Addendum)
Subjective: Still distended, NG in place, he's not comfortable with NG, but better than he was before the NG.  Scout film just taken and If OK they will give the gastrografin after that.  I have talked with the nurse and he will call if there are questions about it.  Objective: Vital signs in last 24 hours: Temp:  [97.7 F (36.5 C)-98.4 F (36.9 C)] 98.4 F (36.9 C) (04/11 0544) Pulse Rate:  [87-115] 100 (04/11 0544) Resp:  [18] 18 (04/11 0544) BP: (113-150)/(68-91) 150/80 mmHg (04/11 0544) SpO2:  [92 %-99 %] 94 % (04/11 0544) Weight:  [88.4 kg (194 lb 14.2 oz)-90.719 kg (200 lb)] 88.4 kg (194 lb 14.2 oz) (04/11 0544) Last BM Date: 03/13/15 Afebrile, VSS 500 from the NG 150 of emesis prior recorded. Labs OK, Intake/Output from previous day: 04/10 0701 - 04/11 0700 In: 1000 [IV Piggyback:1000] Out: 650 [Emesis/NG output:650] Intake/Output this shift:    General appearance: alert, cooperative and no distress GI: distended, still uncomfortable, some drainage, no BS noted last BM was Saturday.  Lab Results:   Recent Labs  03/14/15 2256 03/15/15 0745  WBC 12.3* 10.4  HGB 15.3 13.9  HCT 45.8 41.5  PLT 265 231    BMET  Recent Labs  03/14/15 2256  NA 134*  K 4.2  CL 97  CO2 27  GLUCOSE 180*  BUN 19  CREATININE 1.15  CALCIUM 9.1   PT/INR No results for input(s): LABPROT, INR in the last 72 hours.   Recent Labs Lab 03/14/15 2256  AST 25  ALT 53  ALKPHOS 73  BILITOT 1.1  PROT 7.2  ALBUMIN 4.1     Lipase     Component Value Date/Time   LIPASE 21 03/14/2015 2256     Studies/Results: Ct Abdomen Pelvis W Contrast  03/15/2015   CLINICAL DATA:  Abdominal distention and upper abdominal pain.  EXAM: CT ABDOMEN AND PELVIS WITH CONTRAST  TECHNIQUE: Multidetector CT imaging of the abdomen and pelvis was performed using the standard protocol following bolus administration of intravenous contrast.  CONTRAST:  50mL OMNIPAQUE IOHEXOL 300 MG/ML SOLN, 100mL  OMNIPAQUE IOHEXOL 300 MG/ML SOLN  COMPARISON:  None.  FINDINGS: BODY WALL: Fatty periumbilical hernia. There is a left inguinal hernia containing fat and ascitic fluid.  LOWER CHEST: No contributory findings.  ABDOMEN/PELVIS:  Liver: No focal abnormality.  Biliary: No evidence of biliary obstruction or stone.  Pancreas: Unremarkable.  Spleen: Unremarkable.  Adrenals: Unremarkable.  Kidneys and ureters: No hydronephrosis or stone. Small renal sinus cysts bilaterally.  Bladder: Unremarkable.  Reproductive: No pathologic findings.  Bowel: There is bowel distention and fluid levels leading to the deep left abdomen where there is transition point to decompressed ileum. 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 No evidence of bowel necrosis or perforation. Suspect appendectomy. No pericecal inflammation. Mild scattered colonic diverticulosis.  Retroperitoneum: No mass or adenopathy.  Peritoneum: Small reactive pelvic ascites.  Vascular: No acute abnormality.  OSSEOUS: No acute abnormalities.  IMPRESSION: 1. Distal small bowel obstruction near an enteroenteric anastomosis. 2. Periumbilical and left inguinal fatty hernias.   Electronically Signed   By: Marnee SpringJonathon  Watts M.D.   On: 03/15/2015 02:23    Medications: . chlorhexidine  15 mL Mouth Rinse BID  . diatrizoate meglumine-sodium  90 mL Per NG tube Once  . enoxaparin (LOVENOX) injection  40 mg Subcutaneous Q24H   . dextrose 5 % and 0.45% NaCl 125 mL/hr  at 03/15/15 0610   Prior to Admission medications   Medication Sig Start Date End Date Taking? Authorizing Provider  aspirin EC 81 MG tablet Take 81 mg by mouth daily.   Yes Historical Provider, MD  cyclobenzaprine (FLEXERIL) 10 MG tablet Take 10 mg by mouth daily as needed for muscle spasms.   Yes Historical Provider, MD  diphenhydrAMINE (BENADRYL) 25 MG tablet Take 25 mg by mouth at bedtime.   Yes Historical Provider, MD   ferrous sulfate 325 (65 FE) MG tablet Take 325 mg by mouth daily with breakfast.   Yes Historical Provider, MD  gabapentin (NEURONTIN) 400 MG capsule Take 1,200 mg by mouth 3 (three) times daily.    Yes Historical Provider, MD  levothyroxine (SYNTHROID, LEVOTHROID) 50 MCG tablet Take 50 mcg by mouth daily before breakfast.   Yes Historical Provider, MD  lisinopril (PRINIVIL,ZESTRIL) 20 MG tablet Take 20 mg by mouth daily.   Yes Historical Provider, MD  metFORMIN (GLUCOPHAGE) 500 MG tablet Take 500 mg by mouth 2 (two) times daily with a meal.    Yes Historical Provider, MD  Multiple Vitamin (MULTIVITAMIN WITH MINERALS) TABS tablet Take 1 tablet by mouth daily.   Yes Historical Provider, MD  nabumetone (RELAFEN) 750 MG tablet Take 750 mg by mouth 2 (two) times daily.   Yes Historical Provider, MD  omeprazole (PRILOSEC OTC) 20 MG tablet Take 20 mg by mouth every other day.   Yes Historical Provider, MD  oxyCODONE-acetaminophen (PERCOCET/ROXICET) 5-325 MG per tablet Take 1 tablet by mouth every 4 (four) hours as needed for pain.   Yes Historical Provider, MD  Polyethylene Glycol 400 0.25 % SOLN Apply 1 drop to eye 2 (two) times daily as needed (dry eyes).   Yes Historical Provider, MD  pseudoephedrine-acetaminophen (TYLENOL SINUS) 30-500 MG TABS Take 2 tablets by mouth every morning.   Yes Historical Provider, MD  rosuvastatin (CRESTOR) 10 MG tablet Take 10 mg by mouth daily.   Yes Historical Provider, MD    Assessment/Plan SBO prior abdominal surgery 40 years ago/hernia repair Hypertension AODM GERD  Plan:  SB protocol initiated by Dr Toney Reil, I will add PPI also drainage looks a bit red     LOS: 0 days    JENNINGS,WILLARD 03/15/2015  General Surgery - [facility]  Patient seen and examined this AM.  Discussed with Dr. Derrell Lolling.  Agree with plans for management per SBO protocol.  Will review films when PACS working again today.  Velora Heckler, MD, [facility] Office: [phone]

## 2015-03-15 NOTE — Progress Notes (Signed)
UR completed 

## 2015-03-16 ENCOUNTER — Inpatient Hospital Stay (HOSPITAL_COMMUNITY): Payer: Medicare Other

## 2015-03-16 LAB — BASIC METABOLIC PANEL
Anion gap: 8 (ref 5–15)
BUN: 15 mg/dL (ref 6–23)
CALCIUM: 8.3 mg/dL — AB (ref 8.4–10.5)
CO2: 28 mmol/L (ref 19–32)
Chloride: 96 mmol/L (ref 96–112)
Creatinine, Ser: 1.19 mg/dL (ref 0.50–1.35)
GFR calc Af Amer: 72 mL/min — ABNORMAL LOW (ref >90)
GFR calc non Af Amer: 62 mL/min — ABNORMAL LOW (ref >90)
Glucose, Bld: 191 mg/dL — ABNORMAL HIGH (ref 70–99)
Potassium: 3.6 mmol/L (ref 3.5–5.1)
Sodium: 132 mmol/L — ABNORMAL LOW (ref 135–145)

## 2015-03-16 LAB — CBC
HCT: 41.4 % (ref 39.0–52.0)
Hemoglobin: 13.7 g/dL (ref 13.0–17.0)
MCH: 31.9 pg (ref 26.0–34.0)
MCHC: 33.1 g/dL (ref 30.0–36.0)
MCV: 96.3 fL (ref 78.0–100.0)
PLATELETS: 236 10*3/uL (ref 150–400)
RBC: 4.3 MIL/uL (ref 4.22–5.81)
RDW: 13.8 % (ref 11.5–15.5)
WBC: 6.5 10*3/uL (ref 4.0–10.5)

## 2015-03-16 MED ORDER — HYDROMORPHONE HCL 1 MG/ML IJ SOLN
1.0000 mg | INTRAMUSCULAR | Status: DC | PRN
  Administered 2015-03-16 – 2015-03-18 (×6): 1 mg via INTRAVENOUS
  Filled 2015-03-16 (×5): qty 1

## 2015-03-16 MED ORDER — HYDROMORPHONE HCL 1 MG/ML PO LIQD: 1.0000 mg | ORAL | Status: DC | PRN

## 2015-03-16 MED ORDER — HYDROMORPHONE HCL 1 MG/ML IJ SOLN
INTRAMUSCULAR | Status: AC
  Administered 2015-03-17: 1 mg via INTRAVENOUS
  Filled 2015-03-16: qty 1

## 2015-03-16 NOTE — Progress Notes (Signed)
Patient c/o abdominal pain 10/10, stomach very firm to palpation, with nausea and vomitus, Dr notified, orders given to replace NG Tube if problem continued, and make patient NPO, pain medication given without relief 4 mg Morphine, 4 mg Zofran, and 15 mg Toradol 15 mg and still patient c/o of no relief and level still 10/10, walking in room pacing back and forth, Dr notified again about patient's unchanged condition, and more orders given for 1 mg Dilaudid, and to replace NG Tube ASAP, patient received 1 mg Dilaudid and was able relax and abdomen became soft to touch with very James Barber discomforted, informed patient that orders would still in place if he become worst overnight, patient ask if tube could remained out until in the morning when the doctor could see him, patient in stable condition at this time, report given to night Nurse

## 2015-03-16 NOTE — Progress Notes (Signed)
Patient ID: James Barber, male   DOB: 12-Sep-1946, 69 y.o.   MRN: [id number]  General Surgery - [facility]  HD#: 2  Subjective: Patient with discomfort from NG tube.  Denies abd pain.  Passing flatus.  Less distended.  Objective: Vital signs in last 24 hours: Temp:  [97.7 F (36.5 C)-99.8 F (37.7 C)] 97.7 F (36.5 C) (04/12 0559) Pulse Rate:  [98-103] 99 (04/12 0559) Resp:  [18-22] 22 (04/12 0559) BP: (142-151)/(68-96) 151/96 mmHg (04/12 0559) SpO2:  [95 %-96 %] 96 % (04/12 0559) Last BM Date: 03/13/15  Intake/Output from previous day: 04/11 0701 - 04/12 0700 In: -  Out: 1125 [Emesis/NG output:1125] Intake/Output this shift:    Physical Exam: HEENT - sclerae clear, mucous membranes moist Neck - soft Chest - clear bilaterally Cor - RRR Abdomen - soft, mild distension; BS present; mild lower abd tenderness, no mass, no hernia Ext - no edema, non-tender Neuro - alert & oriented, no focal deficits  Lab Results:   Recent Labs  03/15/15 0745 03/16/15 0523  WBC 10.4 6.5  HGB 13.9 13.7  HCT 41.5 41.4  PLT 231 236   BMET  Recent Labs  03/14/15 2256 03/15/15 0745 03/16/15 0523  NA 134*  --  132*  K 4.2  --  3.6  CL 97  --  96  CO2 27  --  28  GLUCOSE 180*  --  191*  BUN 19  --  15  CREATININE 1.15 1.10 1.19  CALCIUM 9.1  --  8.3*   PT/INR No results for input(s): LABPROT, INR in the last 72 hours. Comprehensive Metabolic Panel:    Component Value Date/Time   NA 132* 03/16/2015 0523   NA 134* 03/14/2015 2256   K 3.6 03/16/2015 0523   K 4.2 03/14/2015 2256   CL 96 03/16/2015 0523   CL 97 03/14/2015 2256   CO2 28 03/16/2015 0523   CO2 27 03/14/2015 2256   BUN 15 03/16/2015 0523   BUN 19 03/14/2015 2256   CREATININE 1.19 03/16/2015 0523   CREATININE 1.10 03/15/2015 0745   GLUCOSE 191* 03/16/2015 0523   GLUCOSE 180* 03/14/2015 2256   CALCIUM 8.3* 03/16/2015 0523   CALCIUM 9.1 03/14/2015 2256   AST 25 03/14/2015 2256   AST 16  08/04/2012 1049   ALT 53 03/14/2015 2256   ALT 28 08/04/2012 1049   ALKPHOS 73 03/14/2015 2256   ALKPHOS 89 08/04/2012 1049   BILITOT 1.1 03/14/2015 2256   BILITOT 0.8 08/04/2012 1049   PROT 7.2 03/14/2015 2256   PROT 7.1 08/04/2012 1049   ALBUMIN 4.1 03/14/2015 2256   ALBUMIN 3.7 08/04/2012 1049    Studies/Results: Ct Abdomen Pelvis W Contrast  03/15/2015   CLINICAL DATA:  Abdominal distention and upper abdominal pain.  EXAM: CT ABDOMEN AND PELVIS WITH CONTRAST  TECHNIQUE: Multidetector CT imaging of the abdomen and pelvis was performed using the standard protocol following bolus administration of intravenous contrast.  CONTRAST:  50mL OMNIPAQUE IOHEXOL 300 MG/ML SOLN, OMNIPAQUE IOHEXOL 300 MG/ML SOLN  COMPARISON:  None.  FINDINGS: BODY WALL: Fatty periumbilical hernia. There is a left inguinal hernia containing fat and ascitic fluid.  LOWER CHEST: No contributory findings.  ABDOMEN/PELVIS:  Liver: No focal abnormality.  Biliary: No evidence of biliary obstruction or stone.  Pancreas: Unremarkable.  Spleen: Unremarkable.  Adrenals: Unremarkable.  Kidneys and ureters: No hydronephrosis or stone. Small renal sinus cysts bilaterally.  Bladder: Unremarkable.  Reproductive: No pathologic findings.  Bowel:  There is bowel distention and fluid levels leading to the deep left abdomen where there is transition point to decompressed ileum. 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 No evidence of bowel necrosis or perforation. Suspect appendectomy. No pericecal inflammation. Mild scattered colonic diverticulosis.  Retroperitoneum: No mass or adenopathy.  Peritoneum: Small reactive pelvic ascites.  Vascular: No acute abnormality.  OSSEOUS: No acute abnormalities.  IMPRESSION: 1. Distal small bowel obstruction near an enteroenteric anastomosis. 2. Periumbilical and left inguinal fatty hernias.   Electronically Signed   By:  Marnee Spring M.D.   On: 03/15/2015 02:23   Dg Abd 2 Views  03/16/2015   CLINICAL DATA:  Small bowel obstruction.  EXAM: ABDOMEN - 2 VIEW  COMPARISON:  03/15/2015  FINDINGS: Nasogastric tube is present with tip over the stomach in the left upper quadrant with side port just below the edge soft should junction in the left upper quadrant. There is interval improvement with overall less number and dilatation of small bowel loops. There are a few air-fluid levels. There are surgical sutures over the midline and left mid abdomen as well as the right lower quadrant. No free peritoneal air. Mild air and contrast throughout the colon. Contrast within the bladder. Remainder of the exam is unchanged.  IMPRESSION: Mild interval improvement in patient's small bowel obstruction with overall less number and dilatation of small bowel loops. No free peritoneal air.  Nasogastric tube with tip and side-port over the stomach in the left upper quadrant.   Electronically Signed   By: Elberta Fortis M.D.   On: 03/16/2015 08:35   Dg Abd Portable 1v-small Bowel Obstruction Protocol-initial, 8 Hr Delay  03/15/2015   CLINICAL DATA:  8 hour delayed film for small bowel obstruction.  EXAM: PORTABLE ABDOMEN - 1 VIEW  COMPARISON:  03/15/2015 abdominal radiograph  FINDINGS: Re- demonstrated marked gaseous distention of multiple small bowel loops. Decrease colonic gas identified. Small amount of oral contrast material is demonstrated projecting over the left and right lower quadrant. Given the appearance on current radiograph, contrast as not definitely appear to be located within the colon.  IMPRESSION: Contrast demonstrated within bowel within the right lower and left lower quadrant. Given the marked gaseous distension of the small bowel, it is difficult to determine the exact location however this is favored to be located within small bowel.  Persistent small bowel obstruction.   Electronically Signed   By: Annia Belt M.D.   On:  03/15/2015 19:06   Dg Abd Portable 1v-small Bowel Protocol-position Verification  03/15/2015   CLINICAL DATA:  Ileostomy 40 years ago. Complains of nausea and vomiting. Mid abdominal pain  EXAM: PORTABLE ABDOMEN - 1 VIEW  COMPARISON:  None.  FINDINGS: The nasogastric tube tip is in the stomach. Again noted is gaseous distension of the small bowel loops. Decrease colonic gas noted. A small amount of the enteric contrast material can be seen in the right upper quadrant of the abdomen. It is unclear whether not this is enlarged or small bowel loops. No free intraperitoneal air identified.  IMPRESSION: 1. Persistent small bowel dilatation compatible with obstruction. 2. Difficult to localize enteric contrast material. This may be within the right upper quadrant of the abdomen either in large or small bowel loops.   Electronically Signed   By: Signa Kell M.D.   On: 03/15/2015 08:45    Anti-infectives: Anti-infectives    None      Assessment &  Plans: Small bowel obstruction  AXR this AM with contrast in colon, less distension of small bowel loops  Small NG output, passing flatus  Will discontinue NG tube and begin clear liquids per protocol  Encouraged ambulation  Discussed with patient and his wife by telephone  Velora Hecklerodd M. Sonya Pucci, MD, [facility] Office: [phone]   Riyana Biel Judie PetitM 03/16/2015

## 2015-03-16 NOTE — Progress Notes (Signed)
Inpatient Diabetes Program Recommendations  AACE/ADA: New Consensus Statement on Inpatient Glycemic Control (2013)  Target Ranges:  Prepandial:   less than 140 mg/dL      Peak postprandial:   less than 180 mg/dL (1-2 hours)      Critically ill patients:  140 - 180 mg/dL     Results for James Barber, James Barber (MRN [id number]) as of 03/16/2015 11:05  Ref. Range 03/14/2015 22:56 03/16/2015 05:23  Glucose Latest Range: 70-99 mg/dL 409180 (H) 811191 (H)    Admit with: SBO  History: DM, HTN  Home DM Meds: Metformin 500 mg bid  Current DM Orders: None     MD- Please consider placing order for Novolog Sensitive SSI tid ac + HS    Will follow Ambrose FinlandJeannine Johnston Zela Sobieski RN, MSN, CDE Diabetes Coordinator Inpatient Diabetes Program Team Pager: [phone] (8a-5p)

## 2015-03-17 ENCOUNTER — Inpatient Hospital Stay (HOSPITAL_COMMUNITY): Payer: Medicare Other

## 2015-03-17 MED ORDER — KCL IN DEXTROSE-NACL 20-5-0.45 MEQ/L-%-% IV SOLN
INTRAVENOUS | Status: DC
  Administered 2015-03-17: 21:00:00 via INTRAVENOUS
  Filled 2015-03-17 (×4): qty 1000

## 2015-03-17 NOTE — Progress Notes (Signed)
Subjective: Had a bad evening with stomach distended almost hard, very painful, got better with increased dilaudid, made NPO.  They did not reinsert the NG.  No nausea, a little flatus, he is not real tender, but still very distended.  He has small BM yesterday AM and again early this AM, very small amount.    Objective: Vital signs in last 24 hours: Temp:  [98.3 F (36.8 C)-98.7 F (37.1 C)] 98.7 F (37.1 C) (04/13 0500) Pulse Rate:  [82-97] 97 (04/13 0500) Resp:  [19-20] 19 (04/13 0500) BP: (135-136)/(71-76) 135/76 mmHg (04/13 0500) SpO2:  [94 %-96 %] 94 % (04/13 0500) Last BM Date: 03/13/15 240 PO Stool x 1 - 1st shift Afebrile, VSS Labs OK yesterday Film yesterday showed interval improvement, SBO No free air Intake/Output from previous day: 04/01/23 0701 - 04/13 0700 In: 1834 [P.O.:240; I.V.:1594] Out: 200 [Emesis/NG output:200] Intake/Output this shift:    General appearance: alert, cooperative, no distress and still distended. Resp: clear to auscultation bilaterally GI: distended, but not tender to palpation, BS hyperactive.  some flauts, small BM early thisAM  Lab Results:   Recent Labs  03/15/15 0745 2015/04/01 0523  WBC 10.4 6.5  HGB 13.9 13.7  HCT 41.5 41.4  PLT 231 236    BMET  Recent Labs  03/14/15 2256 03/15/15 0745 01-Apr-2015 0523  NA 134*  --  132*  K 4.2  --  3.6  CL 97  --  96  CO2 27  --  28  GLUCOSE 180*  --  191*  BUN 19  --  15  CREATININE 1.15 1.10 1.19  CALCIUM 9.1  --  8.3*   PT/INR No results for input(s): LABPROT, INR in the last 72 hours.   Recent Labs Lab 03/14/15 2256  AST 25  ALT 53  ALKPHOS 73  BILITOT 1.1  PROT 7.2  ALBUMIN 4.1     Lipase     Component Value Date/Time   LIPASE 21 03/14/2015 2256     Studies/Results: Dg Abd 2 Views  04/01/15   CLINICAL DATA:  Small bowel obstruction.  EXAM: ABDOMEN - 2 VIEW  COMPARISON:  03/15/2015  FINDINGS: Nasogastric tube is present with tip over the stomach in the left  upper quadrant with side port just below the edge soft should junction in the left upper quadrant. There is interval improvement with overall less number and dilatation of small bowel loops. There are a few air-fluid levels. There are surgical sutures over the midline and left mid abdomen as well as the right lower quadrant. No free peritoneal air. Mild air and contrast throughout the colon. Contrast within the bladder. Remainder of the exam is unchanged.  IMPRESSION: Mild interval improvement in patient's small bowel obstruction with overall less number and dilatation of small bowel loops. No free peritoneal air.  Nasogastric tube with tip and side-port over the stomach in the left upper quadrant.   Electronically Signed   By: Elberta Fortis M.D.   On: 04-01-2015 08:35   Dg Abd Portable 1v-small Bowel Obstruction Protocol-initial, 8 Hr Delay  03/15/2015   CLINICAL DATA:  8 hour delayed film for small bowel obstruction.  EXAM: PORTABLE ABDOMEN - 1 VIEW  COMPARISON:  03/15/2015 abdominal radiograph  FINDINGS: Re- demonstrated marked gaseous distention of multiple small bowel loops. Decrease colonic gas identified. Small amount of oral contrast material is demonstrated projecting over the left and right lower quadrant. Given the appearance on current radiograph, contrast as not definitely appear to  be located within the colon.  IMPRESSION: Contrast demonstrated within bowel within the right lower and left lower quadrant. Given the marked gaseous distension of the small bowel, it is difficult to determine the exact location however this is favored to be located within small bowel.  Persistent small bowel obstruction.   Electronically Signed   By: Annia Beltrew  Davis M.D.   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 Complains of nausea and vomiting. Mid abdominal pain  EXAM: PORTABLE ABDOMEN - 1 VIEW  COMPARISON:  None.   FINDINGS: The nasogastric tube tip is in the stomach. Again noted is gaseous distension of the small bowel loops. Decrease colonic gas noted. A small amount of the enteric contrast material can be seen in the right upper quadrant of the abdomen. It is unclear whether not this is enlarged or small bowel loops. No free intraperitoneal air identified.  IMPRESSION: 1. Persistent small bowel dilatation compatible with obstruction. 2. Difficult to localize enteric contrast material. This may be within the right upper quadrant of the abdomen either in large or small bowel loops.   Electronically Signed   By: Signa Kellaylor  Stroud M.D.   On: 03/15/2015 08:45    Medications: . chlorhexidine  15 mL Mouth Rinse BID  . enoxaparin (LOVENOX) injection  40 mg Subcutaneous Q24H  . pantoprazole (PROTONIX) IV  40 mg Intravenous Q12H    Assessment/Plan: SBO with prior hx of SBO/PSBO AODM Hypertension GERD   Plan:  I will recheck film this AM.  Keep NPO and continue IV fluids.     LOS: 2 days    Marshall Roehrich 03/17/2015

## 2015-03-18 ENCOUNTER — Inpatient Hospital Stay (HOSPITAL_COMMUNITY): Payer: Medicare Other

## 2015-03-18 LAB — CBC
HEMATOCRIT: 37.1 % — AB (ref 39.0–52.0)
HEMOGLOBIN: 12.4 g/dL — AB (ref 13.0–17.0)
MCH: 32 pg (ref 26.0–34.0)
MCHC: 33.4 g/dL (ref 30.0–36.0)
MCV: 95.6 fL (ref 78.0–100.0)
PLATELETS: 222 10*3/uL (ref 150–400)
RBC: 3.88 MIL/uL — AB (ref 4.22–5.81)
RDW: 13.4 % (ref 11.5–15.5)
WBC: 4.3 10*3/uL (ref 4.0–10.5)

## 2015-03-18 LAB — BASIC METABOLIC PANEL
Anion gap: 7 (ref 5–15)
BUN: 9 mg/dL (ref 6–23)
CO2: 27 mmol/L (ref 19–32)
CREATININE: 1.17 mg/dL (ref 0.50–1.35)
Calcium: 8.6 mg/dL (ref 8.4–10.5)
Chloride: 99 mmol/L (ref 96–112)
GFR calc non Af Amer: 63 mL/min — ABNORMAL LOW (ref >90)
GFR, EST AFRICAN AMERICAN: 73 mL/min — AB (ref >90)
GLUCOSE: 169 mg/dL — AB (ref 70–99)
Potassium: 3.3 mmol/L — ABNORMAL LOW (ref 3.5–5.1)
SODIUM: 133 mmol/L — AB (ref 135–145)

## 2015-03-18 LAB — MAGNESIUM: MAGNESIUM: 2.1 mg/dL (ref 1.5–2.5)

## 2015-03-18 MED ORDER — POTASSIUM CHLORIDE IN NACL 40-0.9 MEQ/L-% IV SOLN
INTRAVENOUS | Status: DC
  Administered 2015-03-18 – 2015-03-19 (×2): 100 mL/h via INTRAVENOUS
  Filled 2015-03-18 (×7): qty 1000

## 2015-03-18 NOTE — Progress Notes (Signed)
Subjective: Still distended and tympanic.  He has some pain took one pill last night and says it gave him a bad dream, her could not get out of.  Some stool, soft, but not much.    Objective: Vital signs in last 24 hours: Temp:  [98.5 F (36.9 C)-99 F (37.2 C)] 99 F (37.2 C) (04/14 0700) Pulse Rate:  [79-90] 90 (04/14 0700) Resp:  [18-20] 18 (04/14 0700) BP: (147-174)/(73-83) 158/73 mmHg (04/14 0700) SpO2:  [95 %-99 %] 95 % (04/14 0700) Last BM Date: 03/17/15 840 PO  Clear diet No BM recorded Afebrile, VSS Labs OK glucose is up Intake/Output from previous day: 04/13 0701 - 04/14 0700 In: 2010 [P.O.:840; I.V.:1170] Out: -  Intake/Output this shift:    General appearance: alert, cooperative and no distress GI: distended, tympanic, few BS, some flatus, not really tender, but still not comfortable.  Lab Results:   Recent Labs  03/16/15 0523 03/18/15 0535  WBC 6.5 4.3  HGB 13.7 12.4*  HCT 41.4 37.1*  PLT 236 222    BMET  Recent Labs  03/16/15 0523 03/18/15 0535  NA 132* 133*  K 3.6 3.3*  CL 96 99  CO2 28 27  GLUCOSE 191* 169*  BUN 15 9  CREATININE 1.19 1.17  CALCIUM 8.3* 8.6   PT/INR No results for input(s): LABPROT, INR in the last 72 hours.   Recent Labs Lab 03/14/15 2256  AST 25  ALT 53  ALKPHOS 73  BILITOT 1.1  PROT 7.2  ALBUMIN 4.1     Lipase     Component Value Date/Time   LIPASE 21 03/14/2015 2256     Studies/Results: Dg Abd 1 View  03/17/2015   CLINICAL DATA:  Follow-up small bowel obstruction. Feeling better. Still mid abdominal pain and distention.  EXAM: ABDOMEN - 1 VIEW  COMPARISON:  03/16/2015  FINDINGS: Continued mild gaseous distention of scattered small bowel loops, best seen in the left abdomen. Gas and contrast material seen within the colon. No real change since prior study in the bowel gas pattern. No free air organomegaly.  IMPRESSION: Continued mild small bowel distention, likely reflecting small bowel obstruction.  No real change.   Electronically Signed   By: Charlett Nose M.D.   On: 03/17/2015 09:50   Dg Abd 2 Views  03/18/2015   CLINICAL DATA:  69 year old male with recent small bowel obstruction. Symptoms improving. Initial encounter.  EXAM: ABDOMEN - 2 VIEW  COMPARISON:  03/17/2015 and earlier. CT Abdomen and Pelvis 03/15/2015.  FINDINGS: Upright and supine views of the abdomen. Enteric tube seen several days ago is been removed. Grossly negative lung bases. No free air. Staple line re - identified in the upper abdomen just to the left of midline related to the small bowel to small bowel anastomosis demonstrated by CT. The gas pattern has improved, but not completely normalized with residual small bowel loops measuring 3.5-4 cm diameter with air in fluid levels. Right colonic gas has improved since 03/15/2015. No acute osseous abnormality identified.  IMPRESSION: Improved but not completely resolved small bowel obstruction pattern. No free air.   Electronically Signed   By: Odessa Fleming M.D.   On: 03/18/2015 09:25    Medications: . chlorhexidine  15 mL Mouth Rinse BID  . enoxaparin (LOVENOX) injection  40 mg Subcutaneous Q24H  . pantoprazole (PROTONIX) IV  40 mg Intravenous Q12H   . dextrose 5 % and 0.45 % NaCl with KCl 20 mEq/L 75 mL/hr at 03/17/15 2121  Assessment/Plan SBO with prior hx of SBO/PSBO AODM Hypertension GERD  DVT:  Lovenox/SCD   Plan:  He is at least partially obstructed.  Still having discomfort.  I will leave him on clears for now.  Continue to mobilize.  Take dextrose out of IV. Increase K+ in IV.  Check magnesium.    LOS: 3 days    Eytan Carrigan 03/18/2015

## 2015-03-19 LAB — BASIC METABOLIC PANEL
Anion gap: 6 (ref 5–15)
BUN: 8 mg/dL (ref 6–23)
CHLORIDE: 105 mmol/L (ref 96–112)
CO2: 27 mmol/L (ref 19–32)
Calcium: 8.8 mg/dL (ref 8.4–10.5)
Creatinine, Ser: 1.11 mg/dL (ref 0.50–1.35)
GFR calc Af Amer: 78 mL/min — ABNORMAL LOW (ref >90)
GFR calc non Af Amer: 67 mL/min — ABNORMAL LOW (ref >90)
Glucose, Bld: 118 mg/dL — ABNORMAL HIGH (ref 70–99)
Potassium: 3.8 mmol/L (ref 3.5–5.1)
SODIUM: 138 mmol/L (ref 135–145)

## 2015-03-19 NOTE — Progress Notes (Signed)
Patient ID: James Barber, male   DOB: 08-12-1946, 69 y.o.   MRN: [id number]  General Surgery [facility]  Subjective: Patient had good night.  No pain, nausea, or emesis.  Tolerating clear liquids.  Willing to try regular diet this AM.  Objective: Vital signs in last 24 hours: Temp:  [97.9 F (36.6 C)-99 F (37.2 C)] 97.9 F (36.6 C) (04/15 0550) Pulse Rate:  [80-95] 80 (04/15 0550) Resp:  [16-20] 16 (04/15 0550) BP: (150-166)/(76-91) 164/81 mmHg (04/15 0550) SpO2:  [96 %-100 %] 98 % (04/15 0550) Last BM Date: 03/17/15  Intake/Output from previous day: 04/14 0701 - 04/15 0700 In: 1273.3 [P.O.:470; I.V.:803.3] Out: -  Intake/Output this shift:    Physical Exam: HEENT - sclerae clear, mucous membranes moist Neck - soft Chest - clear bilaterally Cor - RRR Abdomen - soft without distension; BS present; non-tender Ext - no edema, non-tender Neuro - alert & oriented, no focal deficits  Lab Results:   Recent Labs  03/18/15 0535  WBC 4.3  HGB 12.4*  HCT 37.1*  PLT 222   BMET  Recent Labs  03/18/15 0535 03/19/15 0549  NA 133* 138  K 3.3* 3.8  CL 99 105  CO2 27 27  GLUCOSE 169* 118*  BUN 9 8  CREATININE 1.17 1.11  CALCIUM 8.6 8.8   PT/INR No results for input(s): LABPROT, INR in the last 72 hours. Comprehensive Metabolic Panel:    Component Value Date/Time   NA 138 03/19/2015 0549   NA 133* 03/18/2015 0535   K 3.8 03/19/2015 0549   K 3.3* 03/18/2015 0535   CL 105 03/19/2015 0549   CL 99 03/18/2015 0535   CO2 27 03/19/2015 0549   CO2 27 03/18/2015 0535   BUN 8 03/19/2015 0549   BUN 9 03/18/2015 0535   CREATININE 1.11 03/19/2015 0549   CREATININE 1.17 03/18/2015 0535   GLUCOSE 118* 03/19/2015 0549   GLUCOSE 169* 03/18/2015 0535   CALCIUM 8.8 03/19/2015 0549   CALCIUM 8.6 03/18/2015 0535   AST 25 03/14/2015 2256   AST 16 08/04/2012 1049   ALT 53 03/14/2015 2256   ALT 28 08/04/2012 1049   ALKPHOS 73 03/14/2015 2256   ALKPHOS 89 08/04/2012 1049   BILITOT 1.1 03/14/2015 2256   BILITOT 0.8 08/04/2012 1049   PROT 7.2 03/14/2015 2256   PROT 7.1 08/04/2012 1049   ALBUMIN 4.1 03/14/2015 2256   ALBUMIN 3.7 08/04/2012 1049    Studies/Results: Dg Abd 1 View  03/17/2015   CLINICAL DATA:  Follow-up small bowel obstruction. Feeling better. Still mid abdominal pain and distention.  EXAM: ABDOMEN - 1 VIEW  COMPARISON:  03/16/2015  FINDINGS: Continued mild gaseous distention of scattered small bowel loops, best seen in the left abdomen. Gas and contrast material seen within the colon. No real change since prior study in the bowel gas pattern. No free air organomegaly.  IMPRESSION: Continued mild small bowel distention, likely reflecting small bowel obstruction. No real change.   Electronically Signed   By: Charlett Nose M.D.   On: 03/17/2015 09:50   Dg Abd 2 Views  03/18/2015   CLINICAL DATA:  69 year old male with recent small bowel obstruction. Symptoms improving. Initial encounter.  EXAM: ABDOMEN - 2 VIEW  COMPARISON:  03/17/2015 and earlier. CT Abdomen and Pelvis 03/15/2015.  FINDINGS: Upright and supine views of the abdomen. Enteric tube seen several days ago is been removed. Grossly negative lung bases. No free air. Staple line re -  identified in the upper abdomen just to the left of midline related to the small bowel to small bowel anastomosis demonstrated by CT. The gas pattern has improved, but not completely normalized with residual small bowel loops measuring 3.5-4 cm diameter with air in fluid levels. Right colonic gas has improved since 03/15/2015. No acute osseous abnormality identified.  IMPRESSION: Improved but not completely resolved small bowel obstruction pattern. No free air.   Electronically Signed   By: Odessa FlemingH  Hall M.D.   On: 03/18/2015 09:25    Anti-infectives: Anti-infectives    None      Assessment & Plans: Small bowel obstruction  Clinically improved  Will advance to regular diet today  If  tolerated, likely home tomorrow  If fails, will need laparotomy  Discussed with patient and left message for wife.  Agree with plans.  Velora Hecklerodd M. Coni Homesley, MD, [facility] Office: [phone]   July Nickson Judie PetitM 03/19/2015

## 2015-03-20 NOTE — Progress Notes (Signed)
CARE MANAGEMENT NOTE 03/20/2015  Patient:  James Barber,James Barber   Account Number:  [id number]  Date Initiated:  03/20/2015  Documentation initiated by:  Adasia Hoar  Subjective/Objective Assessment:   sbo with resolve     Action/Plan:   home   Anticipated DC Date:  03/20/2015   Anticipated DC Plan:  HOME/SELF CARE  In-house referral  NA      DC Planning Services  CM consult      PAC Choice  NA   Choice offered to / List presented to:  NA           Status of service:  Completed, signed off Medicare Important Message given?   (If response is "NO", the following Medicare IM given date fields will be blank) Date Medicare IM given:   Medicare IM given by:   Date Additional Medicare IM given:   Additional Medicare IM given by:    Discharge Disposition:  HOME/SELF CARE  Per UR Regulation:  Reviewed for med. necessity/level of care/duration of stay  If discussed at Long Length of Stay Meetings, dates discussed:    Comments:  March 20, 2015/Melayah Skorupski L. Earlene Plateravis, RN, BSN, CCM. Case Management [employer] [phone] No discharge needs present of time of review.

## 2015-03-20 NOTE — Progress Notes (Signed)
Pt discharged to home. Discharge information given with wife at bedside. Teach back utilized. No concerns voiced. Left unit in wheelchair pushed by nurse tech. Left in good condition. Pt's wife is taking him home. [email or web address] , rn.

## 2015-03-20 NOTE — Discharge Summary (Signed)
Physician Discharge Summary  Patient ID: James Barber MRN: [id number] DOB/AGE: [birth year] 67 y.o.  Admit date: 03/14/2015 Discharge date: 03/20/2015  Patient Care Team: Mia Creek, MD as PCP - General (Unknown Physician Specialty) Kristeen Miss, MD as Consulting Physician (Neurosurgery) Ladene Artist, MD as Consulting Physician (Gastroenterology)  Admission Diagnoses: Active Problems:   SBO (small bowel obstruction)   Discharge Diagnoses:  Active Problems:   SBO (small bowel obstruction)   POST-OPERATIVE DIAGNOSIS:  * No surgery found *  SURGERY:    SURGEON:    Consults: None  Hospital Course:   Patient with prior laparotomy and bowel surgery decades ago.  Had bowel obstruction requiring lysis of adhesions decades ago.  Back surgery on intermittent narcotics.  Came in with nausea vomiting concerning for bowel obstruction.  A wall hernias but containing fat only.  He was admitted.  Nasogastric tube placed.  Hydrated.  Began to have some flatus.  Tried liquids.  Did have some more intense episodes of nausea vomiting and crampy abdominal pain.  NG tube came out.  Episode of pain and bloating one time but then began to have flatus and bowel movements feeling better.  Gradually advanced to a solid diet.  Pain and other symptoms were treated aggressively.    By the time of discharge, the patient was walking well the hallways, eating food, having flatus.  Pain was well-controlled on an oral medications.  Based on meeting discharge criteria and continuing to recover, I felt it was safe for the patient to be discharged from the hospital to further recover with close followup. Recommendations were discussed in detail.  They are written as well.   Significant Diagnostic Studies:  Results for orders placed or performed during the hospital encounter of 03/14/15 (from the past 72 hour(s))  CBC     Status: Abnormal   Collection Time: 03/18/15  5:35 AM  Result Value Ref Range   WBC 4.3 4.0 - 10.5 K/uL   RBC 3.88 (L) 4.22 - 5.81 MIL/uL   Hemoglobin 12.4 (L) 13.0 - 17.0 g/dL   HCT 37.1 (L) 39.0 - 52.0 %   MCV 95.6 78.0 - 100.0 fL   MCH 32.0 26.0 - 34.0 pg   MCHC 33.4 30.0 - 36.0 g/dL   RDW 13.4 11.5 - 15.5 %   Platelets 222 150 - 400 K/uL  Basic metabolic panel     Status: Abnormal   Collection Time: 03/18/15  5:35 AM  Result Value Ref Range   Sodium 133 (L) 135 - 145 mmol/L   Potassium 3.3 (L) 3.5 - 5.1 mmol/L   Chloride 99 96 - 112 mmol/L   CO2 27 19 - 32 mmol/L   Glucose, Bld 169 (H) 70 - 99 mg/dL   BUN 9 6 - 23 mg/dL   Creatinine, Ser 1.17 0.50 - 1.35 mg/dL   Calcium 8.6 8.4 - 10.5 mg/dL   GFR calc non Af Amer 63 (L) >90 mL/min   GFR calc Af Amer 73 (L) >90 mL/min    Comment: (NOTE) The eGFR has been calculated using the CKD EPI equation. This calculation has not been validated in all clinical situations. eGFR's persistently <90 mL/min signify possible Chronic Kidney Disease.    Anion gap 7 5 - 15  Magnesium     Status: None   Collection Time: 03/18/15  5:35 AM  Result Value Ref Range   Magnesium 2.1 1.5 - 2.5 mg/dL  Basic metabolic panel     Status: Abnormal  Collection Time: 03/19/15  5:49 AM  Result Value Ref Range   Sodium 138 135 - 145 mmol/L   Potassium 3.8 3.5 - 5.1 mmol/L   Chloride 105 96 - 112 mmol/L   CO2 27 19 - 32 mmol/L   Glucose, Bld 118 (H) 70 - 99 mg/dL   BUN 8 6 - 23 mg/dL   Creatinine, Ser 1.11 0.50 - 1.35 mg/dL   Calcium 8.8 8.4 - 10.5 mg/dL   GFR calc non Af Amer 67 (L) >90 mL/min   GFR calc Af Amer 78 (L) >90 mL/min    Comment: (NOTE) The eGFR has been calculated using the CKD EPI equation. This calculation has not been validated in all clinical situations. eGFR's persistently <90 mL/min signify possible Chronic Kidney Disease.    Anion gap 6 5 - 15    Dg Abd 1 View  03/17/2015   CLINICAL DATA:  Follow-up small bowel obstruction. Feeling better. Still mid abdominal pain and distention.  EXAM: ABDOMEN - 1 VIEW   COMPARISON:  03/16/2015  FINDINGS: Continued mild gaseous distention of scattered small bowel loops, best seen in the left abdomen. Gas and contrast material seen within the colon. No real change since prior study in the bowel gas pattern. No free air organomegaly.  IMPRESSION: Continued mild small bowel distention, likely reflecting small bowel obstruction. No real change.   Electronically Signed   By: Rolm Baptise M.D.   On: 03/17/2015 09:50   Dg Abd 2 Views  03/18/2015   CLINICAL DATA:  69 year old male with recent small bowel obstruction. Symptoms improving. Initial encounter.  EXAM: ABDOMEN - 2 VIEW  COMPARISON:  03/17/2015 and earlier. CT Abdomen and Pelvis 03/15/2015.  FINDINGS: Upright and supine views of the abdomen. Enteric tube seen several days ago is been removed. Grossly negative lung bases. No free air. Staple line re - identified in the upper abdomen just to the left of midline related to the small bowel to small bowel anastomosis demonstrated by CT. The gas pattern has improved, but not completely normalized with residual small bowel loops measuring 3.5-4 cm diameter with air in fluid levels. Right colonic gas has improved since 03/15/2015. No acute osseous abnormality identified.  IMPRESSION: Improved but not completely resolved small bowel obstruction pattern. No free air.   Electronically Signed   By: Genevie Ann M.D.   On: 03/18/2015 09:25    Discharge Exam: Blood pressure 153/79, pulse 72, temperature 98.4 F (36.9 C), temperature source Oral, resp. rate 18, height 6' (1.829 m), weight 88.4 kg (194 lb 14.2 oz), SpO2 96 %.  General: Pt awake/alert/oriented x4 in no major acute distress Eyes: PERRL, normal EOM. Sclera nonicteric Neuro: CN II-XII intact w/o focal sensory/motor deficits. Lymph: No head/neck/groin lymphadenopathy Psych:  No delerium/psychosis/paranoia HENT: Normocephalic, Mucus membranes moist.  No thrush Neck: Supple, No tracheal deviation Chest: No pain.  Good  respiratory excursion. CV:  Pulses intact.  Regular rhythm MS: Normal AROM mjr joints.  No obvious deformity Abdomen: Soft, Nondistended.  Flat.  Nontender.  No incarcerated hernias. GU:  NEMG.  Mild inguinal hernias - not incarcerated Ext:  SCDs BLE.  No significant edema.  No cyanosis Skin: No petechiae / purpura  Discharged Condition: good   Past Medical History  Diagnosis Date  . Hypertension   . GERD (gastroesophageal reflux disease)     takes  otc every  other day  . Bowel obstruction   . Diabetes mellitus without complication     Past Surgical History  Procedure Laterality Date  . Abdominal surgery    . Back surgery    . Cervical fusion      c 3-4, C 4-5, C5-6  . Hernia repair      right inguinal  . Anterior cervical decomp/discectomy fusion  11/12/2012    Procedure: ANTERIOR CERVICAL DECOMPRESSION/DISCECTOMY FUSION 2 LEVELS;  Surgeon: Kristeen Miss, MD;  Location: [facility];  Service: Neurosurgery;  Laterality: N/A;  Right Side approach Cervical six-seven,Cervical seven -thoracic one Anterior cervical decompression/diskectomy/fusion    History   Social History  . Marital Status: Married    Spouse Name: N/A  . Number of Children: N/A  . Years of Education: N/A   Occupational History  . Not on file.   Social History Main Topics  . Smoking status: Never Smoker   . Smokeless tobacco: Not on file  . Alcohol Use: No  . Drug Use: No  . Sexual Activity: Yes    Birth Control/ Protection: None   Other Topics Concern  . Not on file   Social History Narrative    History reviewed. No pertinent family history.  Current Facility-Administered Medications  Medication Dose Route Frequency Provider Last Rate Last Dose  . chlorhexidine (PERIDEX) 0.12 % solution 15 mL  15 mL Mouth Rinse BID Ralene Ok, MD   15 mL at 03/20/15 0900  . enoxaparin (LOVENOX) injection 40 mg  40 mg Subcutaneous Q24H Ralene Ok, MD   40 mg at 03/20/15 0901  . HYDROmorphone  (DILAUDID) injection 1 mg  1 mg Intravenous Q4H PRN Coralie Keens, MD   1 mg at 03/18/15 1036  . morphine 2 MG/ML injection 1-4 mg  1-4 mg Intravenous Q2H PRN Earnstine Regal, PA-C   2 mg at 03/20/15 0025  . ondansetron (ZOFRAN) injection 4 mg  4 mg Intravenous Q6H PRN Ralene Ok, MD   4 mg at 03/18/15 1036  . pantoprazole (PROTONIX) injection 40 mg  40 mg Intravenous Q12H Earnstine Regal, PA-C   40 mg at 03/20/15 0901     No Known Allergies  Disposition: 01-Home or Self Care  Discharge Instructions    Call MD for:  extreme fatigue    Complete by:  As directed      Call MD for:  hives    Complete by:  As directed      Call MD for:  persistant nausea and vomiting    Complete by:  As directed      Call MD for:  redness, tenderness, or signs of infection (pain, swelling, redness, odor or green/yellow discharge around incision site)    Complete by:  As directed      Call MD for:  severe uncontrolled pain    Complete by:  As directed      Call MD for:    Complete by:  As directed   Temperature > 101.76F     Diet - low sodium heart healthy    Complete by:  As directed      Discharge instructions    Complete by:  As directed   Please see discharge instruction sheets.  Also refer to handout given an office.  Please call our office if you have any questions or concerns [phone]     Driving Restrictions    Complete by:  As directed   No driving until off narcotics and can safely swerve away without pain during an emergency     Increase activity slowly    Complete by:  As directed  Walk an hour a day.  Use 20-30 minute walks.  When you can walk 30 minutes without difficulty, increase to low impact/moderate activities such as biking, jogging, swimming, sexual activity..  Eventually can increase to unrestricted activity when not feeling pain.  If you feel pain: STOP!Marland Kitchen   Let pain protect you from overdoing it.  Use ice/heat/over-the-counter pain medications to help minimize his  soreness.  Use pain prescriptions as needed to remain active.  It is better to take extra pain medications and be more active than to stay bedridden to avoid all pain medications.     Lifting restrictions    Complete by:  As directed   Avoid heavy lifting initially.  Do not push through pain.  You have no specific weight limit.  Coughing and sneezing or four more stressful to your incision than any lifting you will do. Pain will protect you from injury.  Therefore, avoid intense activity until off all narcotic pain medications.  Coughing and sneezing or four more stressful to your incision than any lifting he will do.     May shower / Bathe    Complete by:  As directed      May walk up steps    Complete by:  As directed      No wound care    Complete by:  As directed      Sexual Activity Restrictions    Complete by:  As directed   Sexual activity as tolerated.  Do not push through pain.  Pain will protect you from injury.     Walk with assistance    Complete by:  As directed   Walk over an hour a day.  May use a walker/cane/companion to help with balance and stamina.            Medication List    TAKE these medications        aspirin EC 81 MG tablet  Take 81 mg by mouth daily.     cyclobenzaprine 10 MG tablet  Commonly known as:  FLEXERIL  Take 10 mg by mouth daily as needed for muscle spasms.     diphenhydrAMINE 25 MG tablet  Commonly known as:  BENADRYL  Take 25 mg by mouth at bedtime.     ferrous sulfate 325 (65 FE) MG tablet  Take 325 mg by mouth daily with breakfast.     gabapentin 400 MG capsule  Commonly known as:  NEURONTIN  Take 1,200 mg by mouth 3 (three) times daily.     levothyroxine 50 MCG tablet  Commonly known as:  SYNTHROID, LEVOTHROID  Take 50 mcg by mouth daily before breakfast.     lisinopril 20 MG tablet  Commonly known as:  PRINIVIL,ZESTRIL  Take 20 mg by mouth daily.     metFORMIN 500 MG tablet  Commonly known as:  GLUCOPHAGE  Take 500 mg by  mouth 2 (two) times daily with a meal.     multivitamin with minerals Tabs tablet  Take 1 tablet by mouth daily.     nabumetone 750 MG tablet  Commonly known as:  RELAFEN  Take 750 mg by mouth 2 (two) times daily.     omeprazole 20 MG tablet  Commonly known as:  PRILOSEC OTC  Take 20 mg by mouth every other day.     oxyCODONE-acetaminophen 5-325 MG per tablet  Commonly known as:  PERCOCET/ROXICET  Take 1 tablet by mouth every 4 (four) hours as needed for pain.  Polyethylene Glycol 400 0.25 % Soln  Apply 1 drop to eye 2 (two) times daily as needed (dry eyes).     pseudoephedrine-acetaminophen 30-500 MG Tabs  Commonly known as:  TYLENOL SINUS  Take 2 tablets by mouth every morning.     rosuvastatin 10 MG tablet  Commonly known as:  CRESTOR  Take 10 mg by mouth daily.           Follow-up Information    Follow up with Earnstine Regal, MD.   Specialty:  General Surgery   Why:  See [place] only as needed, If symptoms worsen   Contact information:   [place] [address] [phone]        Signed: Morton Peters, M.D., F.A.C.S. Gastrointestinal and Minimally Invasive Surgery [facility] [address], [place], [place] [id number] [phone] Main / Paging   03/20/2015, 9:34 AM

## 2015-03-20 NOTE — Discharge Instructions (Signed)
GETTING TO GOOD BOWEL HEALTH. Irregular bowel habits such as constipation and diarrhea can lead to many problems over time.  Having one soft bowel movement a day is the most important way to prevent further problems.  The anorectal canal is designed to handle stretching and feces to safely manage our ability to get rid of solid waste (feces, poop, stool) out of our body.  BUT, hard constipated stools can act like ripping concrete bricks and diarrhea can be a burning fire to this very sensitive area of our body, causing inflamed hemorrhoids, anal fissures, increasing risk is perirectal abscesses, abdominal pain/bloating, an making irritable bowel worse.     The goal: ONE SOFT BOWEL MOVEMENT A DAY!  To have soft, regular bowel movements:   Drink at least 8 tall glasses of water a day.    Take plenty of fiber.  Fiber is the undigested part of plant food that passes into the colon, acting s natures broom to encourage bowel motility and movement.  Fiber can absorb and hold large amounts of water. This results in a larger, bulkier stool, which is soft and easier to pass. Work gradually over several weeks up to 6 servings a day of fiber (25g a day even more if needed) in the form of: o Vegetables -- Root (potatoes, carrots, turnips), leafy green (lettuce, salad greens, celery, spinach), or cooked high residue (cabbage, broccoli, etc) o Fruit -- Fresh (unpeeled skin & pulp), Dried (prunes, apricots, cherries, etc ),  or stewed ( applesauce)  o Whole grain breads, pasta, etc (whole wheat)  o Bran cereals   Bulking Agents -- This type of water-retaining fiber generally is easily obtained each day by one of the following:  o Psyllium bran -- The psyllium plant is remarkable because its ground seeds can retain so much water. This product is available as Metamucil, Konsyl, Effersyllium, Per Diem Fiber, or the less expensive generic preparation in drug and health food stores. Although labeled a laxative, it really  is not a laxative.  o Methylcellulose -- This is another fiber derived from wood which also retains water. It is available as Citrucel. o Polyethylene Glycol - and artificial fiber commonly called Miralax or Glycolax.  It is helpful for people with gassy or bloated feelings with regular fiber o Flax Seed - a less gassy fiber than psyllium  No reading or other relaxing activity while on the toilet. If bowel movements take longer than 5 minutes, you are too constipated  AVOID CONSTIPATION.  High fiber and water intake usually takes care of this.  Sometimes a laxative is needed to stimulate more frequent bowel movements, but   Laxatives are not a good long-term solution as it can wear the colon out. o Osmotics (Milk of Magnesia, Fleets phosphosoda, Magnesium citrate, MiraLax, GoLytely) are safer than  o Stimulants (Senokot, Castor Oil, Dulcolax, Ex Lax)    o Do not take laxatives for more than 7days in a row.   IF SEVERELY CONSTIPATED, try a Bowel Retraining Program: o Do not use laxatives.  o Eat a diet high in roughage, such as bran cereals and leafy vegetables.  o Drink six (6) ounces of prune or apricot juice each morning.  o Eat two (2) large servings of stewed fruit each day.  o Take one (1) heaping tablespoon of a psyllium-based bulking agent twice a day. Use sugar-free sweetener when possible to avoid excessive calories.  o Eat a normal breakfast.  o Set aside 15 minutes after breakfast to  sit on the toilet, but do not strain to have a bowel movement.  o If you do not have a bowel movement by the third day, use an enema and repeat the above steps.   Controlling diarrhea o Switch to liquids and simpler foods for a few days to avoid stressing your intestines further. o Avoid dairy products (especially milk & ice cream) for a short time.  The intestines often can lose the ability to digest lactose when stressed. o Avoid foods that cause gassiness or bloating.  Typical foods include  beans and other legumes, cabbage, broccoli, and dairy foods.  Every person has some sensitivity to other foods, so listen to our body and avoid those foods that trigger problems for you. o Adding fiber (Citrucel, Metamucil, psyllium, Miralax) gradually can help thicken stools by absorbing excess fluid and retrain the intestines to act more normally.  Slowly increase the dose over a few weeks.  Too much fiber too soon can backfire and cause cramping & bloating. o Probiotics (such as active yogurt, Align, etc) may help repopulate the intestines and colon with normal bacteria and calm down a sensitive digestive tract.  Most studies show it to be of mild help, though, and such products can be costly. o Medicines: - Bismuth subsalicylate (ex. Kayopectate, Pepto Bismol) every 30 minutes for up to 6 doses can help control diarrhea.  Avoid if pregnant. - Loperamide (Immodium) can slow down diarrhea.  Start with two tablets (53m total) first and then try one tablet every 6 hours.  Avoid if you are having fevers or severe pain.  If you are not better or start feeling worse, stop all medicines and call your doctor for advice o Call your doctor if you are getting worse or not better.  Sometimes further testing (cultures, endoscopy, X-ray studies, bloodwork, etc) may be needed to help diagnose and treat the cause of the diarrhea.  Small Bowel Obstruction A small bowel obstruction is a blockage (obstruction) of the small intestine (small bowel). The small bowel is a long, slender tube that connects the stomach to the colon. Its job is to absorb nutrients from the fluids and foods you consume into the bloodstream.  CAUSES  There are many causes of intestinal blockage. The most common ones include:  Hernias. This is a more common cause in children than adults.  Inflammatory bowel disease (enteritis and colitis).  Twisting of the bowel (volvulus).  Tumors.  Scar tissue (adhesions) from previous surgery or  radiation treatment.  Recent surgery. This may cause an acute small bowel obstruction called an ileus. SYMPTOMS   Abdominal pain. This may be dull cramps or sharp pain. It may occur in one area or may be present in the entire abdomen. Pain can range from mild to severe, depending on the degree of obstruction.  Nausea and vomiting. Vomit may be greenish or yellow bile color.  Distended or swollen stomach. Abdominal bloating is a common symptom.  Constipation.  Lack of passing gas.  Frequent belching.  Diarrhea. This may occur if runny stool is able to leak around the obstruction. DIAGNOSIS  Your caregiver can usually diagnose small bowel obstruction by taking a history, doing a physical exam, and taking X-rays. If the cause is unclear, a CT scan (computerized tomography) of your abdomen and pelvis may be needed. TREATMENT  Treatment of the blockage depends on the cause and how bad the problem is.   Sometimes, the obstruction improves with bed rest and intravenous (IV) fluids.  Resting the bowel is very important. This means following a simple diet. Sometimes, a clear liquid diet may be required for several days.  Sometimes, a small tube (nasogastric tube) is placed into the stomach to decompress the bowel. When the bowel is blocked, it usually swells up like a balloon filled with air and fluids. Decompression means that the air and fluids are removed by suction through that tube. This can help with pain, discomfort, and nausea. It can also help the obstruction resolve faster.  Surgery may be required if other treatments do not work. Bowel obstruction from a hernia may require early surgery and can be an emergency procedure. Adhesions that cause frequent or severe obstructions may also require surgery. HOME CARE INSTRUCTIONS If your bowel obstruction is only partial or incomplete, you may be allowed to go home.  Get plenty of rest.  Follow your diet as directed by your  caregiver.  Only consume clear liquids until your condition improves.  Avoid solid foods as instructed. SEEK IMMEDIATE MEDICAL CARE IF:  You have increased pain or cramping.  You vomit blood.  You have uncontrolled vomiting or nausea.  You cannot drink fluids due to vomiting or pain.  You develop confusion.  You begin feeling very dry or thirsty (dehydrated).  You have severe bloating.  You have chills.  You have a fever.  You feel extremely weak or you faint. MAKE SURE YOU:  Understand these instructions.  Will watch your condition.  Will get help right away if you are not doing well or get worse. Document Released: 02/06/2006 Document Revised: 02/12/2012 Document Reviewed: 02/03/2011 [facility] Patient Information 2015 [place], [place]. This information is not intended to replace advice given to you by your health care provider. Make sure you discuss any questions you have with your health care provider.   Exercise to Stay Healthy Exercise helps you become and stay healthy. EXERCISE IDEAS AND TIPS Choose exercises that:  You enjoy.  Fit into your day. You do not need to exercise really hard to be healthy. You can do exercises at a slow or medium level and stay healthy. You can:  Stretch before and after working out.  Try yoga, Pilates, or tai chi.  Lift weights.  Walk fast, swim, jog, run, climb stairs, bicycle, dance, or rollerskate.  Take aerobic classes. Exercises that burn about 150 calories:  Running 1  miles in 15 minutes.  Playing volleyball for 45 to 60 minutes.  Washing and waxing a car for 45 to 60 minutes.  Playing touch football for 45 minutes.  Walking 1  miles in 35 minutes.  Pushing a stroller 1  miles in 30 minutes.  Playing basketball for 30 minutes.  Raking leaves for 30 minutes.  Bicycling 5 miles in 30 minutes.  Walking 2 miles in 30 minutes.  Dancing for 30 minutes.  Shoveling snow for 15 minutes.  Swimming laps  for 20 minutes.  Walking up stairs for 15 minutes.  Bicycling 4 miles in 15 minutes.  Gardening for 30 to 45 minutes.  Jumping rope for 15 minutes.  Washing windows or floors for 45 to 60 minutes. Document Released: 12/23/2010 Document Revised: 02/12/2012 Document Reviewed: 12/23/2010 [facility] Patient Information 2015 [place], [place]. This information is not intended to replace advice given to you by your health care provider. Make sure you discuss any questions you have with your health care provider.  ABDOMINAL OBSTRUCTION: INSTRUCTIONS  1. DIET: Follow a light bland diet the first 24 hours after arrival home,  such as soup, liquids, crackers, etc.  Be sure to include lots of fluids daily.  Avoid fast food or heavy meals as your are more likely to get nauseated.  Eat a low fat the next few days after surgery.   2. Take your usually prescribed home medications unless otherwise directed. 3. PAIN CONTROL: a. Pain is best controlled by a usual combination of three different methods TOGETHER: i. Ice/Heat ii. Over the counter pain medication iii. Prescription pain medication b. Most patients will experience some swelling and bruising around the incisions.  Ice packs or heating pads (30-60 minutes up to 6 times a day) will help. Use ice for the first few days to help decrease swelling and bruising, then switch to heat to help relax tight/sore spots and speed recovery.  Some people prefer to use ice alone, heat alone, alternating between ice & heat.  Experiment to what works for you.  Swelling and bruising can take several weeks to resolve.   c. It is helpful to take an over-the-counter pain medication regularly for the first few weeks.  Choose one of the following that works best for you: i. Naproxen (Aleve, etc)  Two 217m tabs twice a day ii. Ibuprofen (Advil, etc) Three 2067mtabs four times a day (every meal & bedtime) iii. Acetaminophen (Tylenol, etc) 500-65051mour times a day (every meal  & bedtime) d. A  prescription for pain medication (such as oxycodone, hydrocodone, etc) should be given to you upon discharge.  Take your pain medication as prescribed.  i. If you are having problems/concerns with the prescription medicine (does not control pain, nausea, vomiting, rash, itching, etc), please call us [phone] see if we need to switch you to a different pain medicine that will work better for you and/or control your side effect better. ii. If you need a refill on your pain medication, please contact your pharmacy.  They will contact our office to request authorization. Prescriptions will not be filled after 5 pm or on week-ends. 4. Avoid getting constipated.  Between the surgery and the pain medications, it is common to experience some constipation.  Increasing fluid intake and taking a fiber supplement (such as Metamucil, Citrucel, FiberCon, MiraLax, etc) 1-2 times a day regularly will usually help prevent this problem from occurring.  A mild laxative (prune juice, Milk of Magnesia, MiraLax, etc) should be taken according to package directions if there are no bowel movements after 48 hours.   5. Watch out for diarrhea.  If you have many loose bowel movements, simplify your diet to bland foods & liquids for a few days.  Stop any stool softeners and decrease your fiber supplement.  Switching to mild anti-diarrheal medications (Kayopectate, Pepto Bismol) can help.  If this worsens or does not improve, please call us.[place]. Wash / shower every day.  You may shower over the incision / wound.  Avoid baths until the skin is fully healed.  Continue to shower over incision(s) after the dressing is off. 7. Remove your waterproof bandages 5 days after surgery.  You may leave the incision open to air.  Remove any wicks or ribbons in your wound.  If you have an open wound, please see wound care instructions. You may replace a dressing/Band-Aid to cover the incision for comfort if you  wish. 8. ACTIVITIES as tolerated:   a. You may resume regular (light) daily activities beginning the next day--such as daily self-care, walking, climbing stairs--gradually increasing activities as tolerated.  If you  can walk 30 minutes without difficulty, it is safe to try more intense activity such as jogging, treadmill, bicycling, low-impact aerobics, swimming, etc. b. Save the most intensive and strenuous activity for last such as sit-ups, heavy lifting, contact sports, etc  Refrain from any heavy lifting or straining until you are off narcotics for pain control.   c. DO NOT PUSH THROUGH PAIN.  Let pain be your guide: If it hurts to do something, don't do it.  Pain is your body warning you to avoid that activity for another week until the pain goes down. d. You may drive when you are no longer taking prescription pain medication, you can comfortably wear a seatbelt, and you can safely maneuver your car and apply brakes. e. Dennis Bast may have sexual intercourse when it is comfortable.  9. FOLLOW UP in our office a. Please call [facility] at [phone] to set up an appointment to see your surgeon in the office for a follow-up appointment approximately 1-2 weeks after your surgery. b. Make sure that you call for this appointment the day you arrive home to insure a convenient appointment time. 10. IF YOU HAVE DISABILITY OR FAMILY LEAVE FORMS, BRING THEM TO THE OFFICE FOR PROCESSING.  DO NOT GIVE THEM TO YOUR DOCTOR.   WHEN TO CALL us [phone]: 1. Poor pain control 2. Reactions / problems with new medications (rash/itching, nausea, etc)  3. Fever over 101.5 F (38.5 C) 4. Inability to urinate 5. Nausea and/or vomiting 6. Worsening swelling or bruising 7. Continued bleeding from incision. 8. Increased pain, redness, or drainage from the incision  The clinic staff is available to answer your questions during regular business hours (8:30am-5pm).  Please dont hesitate to call and ask to speak to one of  our nurses for clinical concerns.   A surgeon from [facility] is always on call at the hospitals   If you have a medical emergency, go to the nearest emergency room or call 911.    [facility], [address], [place], [place], [place]  [address] ? MAIN: [phone] ? TOLL FREE: [phone] ? FAX [phone] [email or web address]  CONSIDER SURGERY TO FIX YOUR Inguinal Hernias  CONSIDER SURGERY TO FIX YOUR InguMuscles help keep everything in the body in its proper place. But if a weak spot in the muscles develops, something can poke through. That is called a hernia. When this happens in the lower part of the belly (abdomen), it is called an inguinal hernia. (It takes its name from a part of the body in this region called the inguinal canal.) A weak spot in the wall of muscles lets some fat or part of the small intestine bulge through. An inguinal hernia can develop at any age. Men get them more often than women. CAUSES  In adults, an inguinal hernia develops over time.  It can be triggered by:  Suddenly straining the muscles of the lower abdomen.  Lifting heavy objects.  Straining to have a bowel movement. Difficult bowel movements (constipation) can lead to this.  Constant coughing. This may be caused by smoking or lung disease.  Being overweight.  Being pregnant.  Working at a job that requires long periods of standing or heavy lifting.  Having had an inguinal hernia before. One type can be an emergency situation. It is called a strangulated inguinal hernia. It develops if part of the small intestine slips through the weak spot and cannot get back into the abdomen. The  blood supply can be cut off. If that happens, part of the intestine may die. This situation requires emergency surgery. SYMPTOMS  Often, a small inguinal hernia has no symptoms. It is found when a healthcare provider does a physical exam. Larger hernias usually  have symptoms.   In adults, symptoms may include:  A lump in the groin. This is easier to see when the person is standing. It might disappear when lying down.  In men, a lump in the scrotum.  Pain or burning in the groin. This occurs especially when lifting, straining or coughing.  A dull ache or feeling of pressure in the groin.  Signs of a strangulated hernia can include:  A bulge in the groin that becomes very painful and tender to the touch.  A bulge that turns red or purple.  Fever, nausea and vomiting.  Inability to have a bowel movement or to pass gas. DIAGNOSIS  To decide if you have an inguinal hernia, a healthcare provider will probably do a physical examination.  This will include asking questions about any symptoms you have noticed.  The healthcare provider might feel the groin area and ask you to cough. If an inguinal hernia is felt, the healthcare provider may try to slide it back into the abdomen.  Usually no other tests are needed. TREATMENT  Treatments can vary. The size of the hernia makes a difference. Options include:  Watchful waiting. This is often suggested if the hernia is small and you have had no symptoms.  No medical procedure will be done unless symptoms develop.  You will need to watch closely for symptoms. If any occur, contact your healthcare provider right away.  Surgery. This is used if the hernia is larger or you have symptoms.  Open surgery. This is usually an outpatient procedure (you will not stay overnight in a hospital). An cut (incision) is made through the skin in the groin. The hernia is put back inside the abdomen. The weak area in the muscles is then repaired by herniorrhaphy or hernioplasty. Herniorrhaphy: in this type of surgery, the weak muscles are sewn back together. Hernioplasty: a patch or mesh is used to close the weak area in the abdominal wall.  Laparoscopy. In this procedure, a surgeon makes small incisions. A thin tube  with a tiny video camera (called a laparoscope) is put into the abdomen. The surgeon repairs the hernia with mesh by looking with the video camera and using two long instruments. HOME CARE INSTRUCTIONS   After surgery to repair an inguinal hernia:  You will need to take pain medicine prescribed by your healthcare provider. Follow all directions carefully.  You will need to take care of the wound from the incision.  Your activity will be restricted for awhile. This will probably include no heavy lifting for several weeks. You also should not do anything too active for a few weeks. When you can return to work will depend on the type of job that you have.  During "watchful waiting" periods, you should:  Maintain a healthy weight.  Eat a diet high in fiber (fruits, vegetables and whole grains).  Drink plenty of fluids to avoid constipation. This means drinking enough water and other liquids to keep your urine clear or pale yellow.  Do not lift heavy objects.  Do not stand for long periods of time.  Quit smoking. This should keep you from developing a frequent cough. SEEK MEDICAL CARE IF:   A bulge develops in your groin  area.  You feel pain, a burning sensation or pressure in the groin. This might be worse if you are lifting or straining.  You develop a fever of more than 100.5 F (38.1 C). SEEK IMMEDIATE MEDICAL CARE IF:   Pain in the groin increases suddenly.  A bulge in the groin gets bigger suddenly and does not go down.  For men, there is sudden pain in the scrotum. Or, the size of the scrotum increases.  A bulge in the groin area becomes red or purple and is painful to touch.  You have nausea or vomiting that does not go away.  You feel your heart beating much faster than normal.  You cannot have a bowel movement or pass gas.  You develop a fever of more than 102.0 F (38.9 C). Document Released: 04/08/2009 Document Revised: 02/12/2012 Document Reviewed:  04/08/2009 [facility] Patient Information 2015 [place], [place]. This information is not intended to replace advice given to you by your health care provider. Make sure you discuss any questions you have with your health care provider.

## 2015-03-21 ENCOUNTER — Emergency Department (HOSPITAL_BASED_OUTPATIENT_CLINIC_OR_DEPARTMENT_OTHER): Payer: Medicare Other

## 2015-03-21 ENCOUNTER — Encounter (HOSPITAL_BASED_OUTPATIENT_CLINIC_OR_DEPARTMENT_OTHER): Payer: Self-pay | Admitting: *Deleted

## 2015-03-21 ENCOUNTER — Inpatient Hospital Stay (HOSPITAL_BASED_OUTPATIENT_CLINIC_OR_DEPARTMENT_OTHER)
Admission: EM | Admit: 2015-03-21 | Discharge: 2015-04-02 | DRG: 335 | Disposition: A | Discharge status: Home / Self Care | Payer: Medicare Other | Attending: General Surgery | Admitting: General Surgery

## 2015-03-21 DIAGNOSIS — K913 Postprocedural intestinal obstruction: Secondary | ICD-10-CM | POA: Diagnosis not present

## 2015-03-21 DIAGNOSIS — R0602 Shortness of breath: Secondary | ICD-10-CM

## 2015-03-21 DIAGNOSIS — M4323 Fusion of spine, cervicothoracic region: Secondary | ICD-10-CM | POA: Diagnosis present

## 2015-03-21 DIAGNOSIS — K56609 Unspecified intestinal obstruction, unspecified as to partial versus complete obstruction: Secondary | ICD-10-CM | POA: Diagnosis present

## 2015-03-21 DIAGNOSIS — R7989 Other specified abnormal findings of blood chemistry: Secondary | ICD-10-CM | POA: Diagnosis present

## 2015-03-21 DIAGNOSIS — E039 Hypothyroidism, unspecified: Secondary | ICD-10-CM | POA: Diagnosis present

## 2015-03-21 DIAGNOSIS — I2699 Other pulmonary embolism without acute cor pulmonale: Secondary | ICD-10-CM | POA: Diagnosis not present

## 2015-03-21 DIAGNOSIS — I10 Essential (primary) hypertension: Secondary | ICD-10-CM | POA: Diagnosis present

## 2015-03-21 DIAGNOSIS — K432 Incisional hernia without obstruction or gangrene: Secondary | ICD-10-CM | POA: Diagnosis present

## 2015-03-21 DIAGNOSIS — R0789 Other chest pain: Secondary | ICD-10-CM

## 2015-03-21 DIAGNOSIS — E871 Hypo-osmolality and hyponatremia: Secondary | ICD-10-CM | POA: Diagnosis not present

## 2015-03-21 DIAGNOSIS — R000 Tachycardia, unspecified: Secondary | ICD-10-CM | POA: Diagnosis present

## 2015-03-21 DIAGNOSIS — G629 Polyneuropathy, unspecified: Secondary | ICD-10-CM | POA: Diagnosis present

## 2015-03-21 DIAGNOSIS — R32 Unspecified urinary incontinence: Secondary | ICD-10-CM | POA: Diagnosis not present

## 2015-03-21 DIAGNOSIS — K219 Gastro-esophageal reflux disease without esophagitis: Secondary | ICD-10-CM | POA: Diagnosis present

## 2015-03-21 DIAGNOSIS — E119 Type 2 diabetes mellitus without complications: Secondary | ICD-10-CM | POA: Diagnosis not present

## 2015-03-21 DIAGNOSIS — K5669 Other intestinal obstruction: Secondary | ICD-10-CM | POA: Diagnosis present

## 2015-03-21 DIAGNOSIS — Z79899 Other long term (current) drug therapy: Secondary | ICD-10-CM

## 2015-03-21 DIAGNOSIS — E785 Hyperlipidemia, unspecified: Secondary | ICD-10-CM | POA: Diagnosis present

## 2015-03-21 DIAGNOSIS — K4020 Bilateral inguinal hernia, without obstruction or gangrene, not specified as recurrent: Secondary | ICD-10-CM | POA: Diagnosis present

## 2015-03-21 DIAGNOSIS — Z7982 Long term (current) use of aspirin: Secondary | ICD-10-CM | POA: Diagnosis not present

## 2015-03-21 DIAGNOSIS — K565 Intestinal adhesions [bands] with obstruction (postprocedural) (postinfection): Principal | ICD-10-CM | POA: Diagnosis present

## 2015-03-21 DIAGNOSIS — R945 Abnormal results of liver function studies: Secondary | ICD-10-CM

## 2015-03-21 DIAGNOSIS — R3915 Urgency of urination: Secondary | ICD-10-CM | POA: Diagnosis not present

## 2015-03-21 DIAGNOSIS — R339 Retention of urine, unspecified: Secondary | ICD-10-CM | POA: Diagnosis not present

## 2015-03-21 DIAGNOSIS — E1165 Type 2 diabetes mellitus with hyperglycemia: Secondary | ICD-10-CM | POA: Diagnosis present

## 2015-03-21 LAB — COMPREHENSIVE METABOLIC PANEL
ALBUMIN: 3.7 g/dL (ref 3.5–5.2)
ALT: 97 U/L — ABNORMAL HIGH (ref 0–53)
AST: 79 U/L — ABNORMAL HIGH (ref 0–37)
Alkaline Phosphatase: 154 U/L — ABNORMAL HIGH (ref 39–117)
Anion gap: 11 (ref 5–15)
BUN: 13 mg/dL (ref 6–23)
CALCIUM: 9 mg/dL (ref 8.4–10.5)
CO2: 22 mmol/L (ref 19–32)
Chloride: 99 mmol/L (ref 96–112)
Creatinine, Ser: 1.16 mg/dL (ref 0.50–1.35)
GFR calc Af Amer: 74 mL/min — ABNORMAL LOW (ref >90)
GFR calc non Af Amer: 64 mL/min — ABNORMAL LOW (ref >90)
GLUCOSE: 161 mg/dL — AB (ref 70–99)
Potassium: 3.4 mmol/L — ABNORMAL LOW (ref 3.5–5.1)
SODIUM: 132 mmol/L — AB (ref 135–145)
Total Bilirubin: 1.1 mg/dL (ref 0.3–1.2)
Total Protein: 7.5 g/dL (ref 6.0–8.3)

## 2015-03-21 LAB — CBC WITH DIFFERENTIAL/PLATELET
BASOS ABS: 0 10*3/uL (ref 0.0–0.1)
BASOS PCT: 1 % (ref 0–1)
EOS ABS: 0.1 10*3/uL (ref 0.0–0.7)
EOS PCT: 2 % (ref 0–5)
HEMATOCRIT: 39.8 % (ref 39.0–52.0)
Hemoglobin: 13.8 g/dL (ref 13.0–17.0)
Lymphocytes Relative: 13 % (ref 12–46)
Lymphs Abs: 0.9 10*3/uL (ref 0.7–4.0)
MCH: 32.1 pg (ref 26.0–34.0)
MCHC: 34.7 g/dL (ref 30.0–36.0)
MCV: 92.6 fL (ref 78.0–100.0)
MONO ABS: 0.6 10*3/uL (ref 0.1–1.0)
Monocytes Relative: 9 % (ref 3–12)
NEUTROS ABS: 4.9 10*3/uL (ref 1.7–7.7)
NEUTROS PCT: 75 % (ref 43–77)
Platelets: 355 10*3/uL (ref 150–400)
RBC: 4.3 MIL/uL (ref 4.22–5.81)
RDW: 12.8 % (ref 11.5–15.5)
WBC: 6.5 10*3/uL (ref 4.0–10.5)

## 2015-03-21 LAB — I-STAT CG4 LACTIC ACID, ED: LACTIC ACID, VENOUS: 1.16 mmol/L (ref 0.5–2.0)

## 2015-03-21 LAB — LIPASE, BLOOD: Lipase: 51 U/L (ref 11–59)

## 2015-03-21 MED ORDER — ENOXAPARIN SODIUM 40 MG/0.4ML ~~LOC~~ SOLN
40.0000 mg | SUBCUTANEOUS | Status: DC
  Filled 2015-03-21: qty 0.4

## 2015-03-21 MED ORDER — ENOXAPARIN SODIUM 40 MG/0.4ML ~~LOC~~ SOLN
40.0000 mg | Freq: Once | SUBCUTANEOUS | Status: AC
  Administered 2015-03-21: 40 mg via SUBCUTANEOUS
  Filled 2015-03-21: qty 0.4

## 2015-03-21 MED ORDER — ALUM & MAG HYDROXIDE-SIMETH 200-200-20 MG/5ML PO SUSP: 30.0000 mL | Freq: Four times a day (QID) | ORAL | Status: DC | PRN

## 2015-03-21 MED ORDER — BISACODYL 10 MG RE SUPP: 10.0000 mg | Freq: Two times a day (BID) | RECTAL | Status: DC | PRN

## 2015-03-21 MED ORDER — MAGIC MOUTHWASH
15.0000 mL | Freq: Four times a day (QID) | ORAL | Status: DC | PRN
  Filled 2015-03-21: qty 15

## 2015-03-21 MED ORDER — CHLORHEXIDINE GLUCONATE 4 % EX LIQD
1.0000 "application " | Freq: Once | CUTANEOUS | Status: AC
  Administered 2015-03-22: 1 via TOPICAL
  Filled 2015-03-21: qty 15

## 2015-03-21 MED ORDER — LABETALOL HCL 5 MG/ML IV SOLN
10.0000 mg | INTRAVENOUS | Status: DC | PRN
  Filled 2015-03-21: qty 4

## 2015-03-21 MED ORDER — MORPHINE SULFATE 2 MG/ML IJ SOLN
2.0000 mg | INTRAMUSCULAR | Status: DC | PRN
  Administered 2015-03-21: 4 mg via INTRAVENOUS
  Filled 2015-03-21: qty 2

## 2015-03-21 MED ORDER — DEXTROSE 5 % IV SOLN
2.0000 g | INTRAVENOUS | Status: AC
  Administered 2015-03-22: 2 g via INTRAVENOUS
  Filled 2015-03-21: qty 2

## 2015-03-21 MED ORDER — ONDANSETRON HCL 4 MG/2ML IJ SOLN: 4.0000 mg | INTRAMUSCULAR | Status: DC | PRN

## 2015-03-21 MED ORDER — KETOROLAC TROMETHAMINE 30 MG/ML IJ SOLN
30.0000 mg | Freq: Once | INTRAMUSCULAR | Status: AC
  Administered 2015-03-21: 30 mg via INTRAVENOUS

## 2015-03-21 MED ORDER — LIP MEDEX EX OINT
1.0000 | TOPICAL_OINTMENT | Freq: Two times a day (BID) | CUTANEOUS | Status: DC
Start: 2015-03-21 — End: 2015-04-02
  Administered 2015-03-21 – 2015-04-02 (×15): 1 via TOPICAL
  Filled 2015-03-21 (×4): qty 7

## 2015-03-21 MED ORDER — LIDOCAINE HCL 2 % EX GEL
1.0000 "application " | Freq: Once | CUTANEOUS | Status: AC
  Administered 2015-03-21: 1 via TOPICAL
  Filled 2015-03-21: qty 5

## 2015-03-21 MED ORDER — MENTHOL 3 MG MT LOZG
1.0000 | LOZENGE | OROMUCOSAL | Status: DC | PRN
  Filled 2015-03-21: qty 9

## 2015-03-21 MED ORDER — PROMETHAZINE HCL 25 MG/ML IJ SOLN: 6.2500 mg | INTRAMUSCULAR | Status: DC | PRN

## 2015-03-21 MED ORDER — PHENOL 1.4 % MT LIQD
2.0000 | OROMUCOSAL | Status: DC | PRN
  Filled 2015-03-21: qty 177

## 2015-03-21 MED ORDER — CHLORHEXIDINE GLUCONATE 4 % EX LIQD
1.0000 "application " | Freq: Once | CUTANEOUS | Status: AC
  Administered 2015-03-21: 1 via TOPICAL
  Filled 2015-03-21: qty 15

## 2015-03-21 MED ORDER — PANTOPRAZOLE SODIUM 40 MG IV SOLR
40.0000 mg | Freq: Every day | INTRAVENOUS | Status: DC
  Administered 2015-03-21 – 2015-03-25 (×5): 40 mg via INTRAVENOUS
  Filled 2015-03-21 (×6): qty 40

## 2015-03-21 MED ORDER — ONDANSETRON HCL 4 MG/2ML IJ SOLN
4.0000 mg | Freq: Once | INTRAMUSCULAR | Status: AC
  Administered 2015-03-21: 4 mg via INTRAVENOUS

## 2015-03-21 MED ORDER — MORPHINE SULFATE 4 MG/ML IJ SOLN
6.0000 mg | Freq: Once | INTRAMUSCULAR | Status: AC
  Administered 2015-03-21: 6 mg via INTRAVENOUS
  Filled 2015-03-21: qty 2

## 2015-03-21 MED ORDER — HYDROMORPHONE HCL 1 MG/ML IJ SOLN
1.0000 mg | Freq: Once | INTRAMUSCULAR | Status: DC
  Filled 2015-03-21: qty 1

## 2015-03-21 MED ORDER — LACTATED RINGERS IV BOLUS (SEPSIS): 1000.0000 mL | Freq: Three times a day (TID) | INTRAVENOUS | Status: DC | PRN

## 2015-03-21 MED ORDER — KCL IN DEXTROSE-NACL 40-5-0.9 MEQ/L-%-% IV SOLN
INTRAVENOUS | Status: DC
  Administered 2015-03-21: 125 mL/h via INTRAVENOUS
  Administered 2015-03-21 – 2015-03-25 (×8): via INTRAVENOUS
  Filled 2015-03-21 (×17): qty 1000

## 2015-03-21 MED ORDER — LEVOTHYROXINE SODIUM 100 MCG IV SOLR
25.0000 ug | Freq: Every day | INTRAVENOUS | Status: DC
  Administered 2015-03-21 – 2015-03-28 (×7): 25 ug via INTRAVENOUS
  Filled 2015-03-21 (×9): qty 5

## 2015-03-21 NOTE — ED Notes (Signed)
Back from xray, alert, NAD, calm, interactive, pain decreased, no changes.

## 2015-03-21 NOTE — ED Provider Notes (Signed)
CSN: [id number]     Arrival date & time 03/21/15  0059 History   First MD Initiated Contact with Patient 03/21/15 0118     Chief Complaint  Patient presents with  . Abdominal Pain     (Consider location/radiation/quality/duration/timing/severity/associated sxs/prior Treatment) Patient is a 69 y.o. male presenting with abdominal pain.  Abdominal Pain Pain location:  Generalized Pain quality: cramping   Pain radiates to:  Does not radiate Pain severity:  Severe Onset quality:  Gradual Duration:  7 hours Timing:  Constant Progression:  Worsening Chronicity:  New Context: previous surgery (decades ago abd surgery)   Relieved by:  Nothing Worsened by:  Nothing tried Ineffective treatments:  None tried Associated symptoms: no anorexia, no constipation, no diarrhea, no dysuria, no fever, no nausea and no vomiting     Past Medical History  Diagnosis Date  . Hypertension   . GERD (gastroesophageal reflux disease)     takes  otc every  other day  . Bowel obstruction   . Diabetes mellitus without complication    Past Surgical History  Procedure Laterality Date  . Abdominal surgery    . Back surgery    . Cervical fusion      c 3-4, C 4-5, C5-6  . Hernia repair      right inguinal  . Anterior cervical decomp/discectomy fusion  11/12/2012    Procedure: ANTERIOR CERVICAL DECOMPRESSION/DISCECTOMY FUSION 2 LEVELS;  Surgeon: Barnett AbuHenry Elsner, MD;  Location: [facility];  Service: Neurosurgery;  Laterality: N/A;  Right Side approach Cervical six-seven,Cervical seven -thoracic one Anterior cervical decompression/diskectomy/fusion   No family history on file. History  Substance Use Topics  . Smoking status: Never Smoker   . Smokeless tobacco: Not on file  . Alcohol Use: No    Review of Systems  Constitutional: Negative for fever.  Gastrointestinal: Positive for abdominal pain. Negative for nausea, vomiting, diarrhea, constipation and anorexia.  Genitourinary: Negative for dysuria.   All other systems reviewed and are negative.     Allergies  Review of patient's allergies indicates no known allergies.  Home Medications   Prior to Admission medications   Medication Sig Start Date End Date Taking? Authorizing Provider  aspirin EC 81 MG tablet Take 81 mg by mouth daily.    Historical Provider, MD  cyclobenzaprine (FLEXERIL) 10 MG tablet Take 10 mg by mouth daily as needed for muscle spasms.    Historical Provider, MD  diphenhydrAMINE (BENADRYL) 25 MG tablet Take 25 mg by mouth at bedtime.    Historical Provider, MD  ferrous sulfate 325 (65 FE) MG tablet Take 325 mg by mouth daily with breakfast.    Historical Provider, MD  gabapentin (NEURONTIN) 400 MG capsule Take 1,200 mg by mouth 3 (three) times daily.     Historical Provider, MD  levothyroxine (SYNTHROID, LEVOTHROID) 50 MCG tablet Take 50 mcg by mouth daily before breakfast.    Historical Provider, MD  lisinopril (PRINIVIL,ZESTRIL) 20 MG tablet Take 20 mg by mouth daily.    Historical Provider, MD  metFORMIN (GLUCOPHAGE) 500 MG tablet Take 500 mg by mouth 2 (two) times daily with a meal.     Historical Provider, MD  Multiple Vitamin (MULTIVITAMIN WITH MINERALS) TABS tablet Take 1 tablet by mouth daily.    Historical Provider, MD  nabumetone (RELAFEN) 750 MG tablet Take 750 mg by mouth 2 (two) times daily.    Historical Provider, MD  omeprazole (PRILOSEC OTC) 20 MG tablet Take 20 mg by mouth every other day.  Historical Provider, MD  oxyCODONE-acetaminophen (PERCOCET/ROXICET) 5-325 MG per tablet Take 1 tablet by mouth every 4 (four) hours as needed for pain.    Historical Provider, MD  Polyethylene Glycol 400 0.25 % SOLN Apply 1 drop to eye 2 (two) times daily as needed (dry eyes).    Historical Provider, MD  pseudoephedrine-acetaminophen (TYLENOL SINUS) 30-500 MG TABS Take 2 tablets by mouth every morning.    Historical Provider, MD  rosuvastatin (CRESTOR) 10 MG tablet Take 10 mg by mouth daily.    Historical  Provider, MD   BP 157/81 mmHg  Pulse 75  Temp(Src) 97.7 F (36.5 C) (Oral)  Resp 18  Ht 6' (1.829 m)  Wt 187 lb 9.8 oz (85.1 kg)  BMI 25.44 kg/m2  SpO2 96% Physical Exam  Constitutional: He is oriented to person, place, and time. He appears well-developed and well-nourished.  HENT:  Head: Normocephalic and atraumatic.  Eyes: Conjunctivae and EOM are normal.  Neck: Normal range of motion. Neck supple.  Cardiovascular: Normal rate, regular rhythm and normal heart sounds.   Pulmonary/Chest: Effort normal and breath sounds normal. No respiratory distress.  Abdominal: He exhibits no distension. There is generalized tenderness. There is no rebound and no guarding.  Musculoskeletal: Normal range of motion.  Neurological: He is alert and oriented to person, place, and time.  Skin: Skin is warm and dry.  Vitals reviewed.   ED Course  Procedures (including critical care time) Labs Review Labs Reviewed  COMPREHENSIVE METABOLIC PANEL - Abnormal; Notable for the following:    Sodium 132 (*)    Potassium 3.4 (*)    Glucose, Bld 161 (*)    AST 79 (*)    ALT 97 (*)    Alkaline Phosphatase 154 (*)    GFR calc non Af Amer 64 (*)    GFR calc Af Amer 74 (*)    All other components within normal limits  CBC WITH DIFFERENTIAL/PLATELET  LIPASE, BLOOD  URINALYSIS, ROUTINE W REFLEX MICROSCOPIC  LACTIC ACID, PLASMA  LACTIC ACID, PLASMA  I-STAT CG4 LACTIC ACID, ED    Imaging Review Dg Abd 1 View  03/21/2015   CLINICAL DATA:  Abdominal pain. Recent hospital admission, discharge yesterday morning post bowel obstruction. Increasing abdominal pain since hospital discharge.  EXAM: ABDOMEN - 1 VIEW  COMPARISON:  Radiographs 03/18/2015  FINDINGS: Progressive small bowel dilatation in the central abdomen compared to prior exam. Enteric chain sutures noted in the right mid abdomen. Air seen throughout the colon. No evidence of free intra-abdominal air or pneumatosis.  IMPRESSION: Progressive gaseous  distention of small bowel, concerning for recurrent small bowel obstruction.   Electronically Signed   By: Rubye Oaks M.D.   On: 03/21/2015 02:25     EKG Interpretation None      MDM   Final diagnoses:  SBO (small bowel obstruction)    69 y.o. male with pertinent PMH of prior SBO with admission and dc 1 day ago presents with recurrent similar abd pain.  No n/v, pt still passing flatus.  Exam as above.  Given toradol, dilaudid for pain, zofran for nausea.  Repeat wu with SBO.  Consulted surgery and pt admitted.   I have reviewed all laboratory and imaging studies if ordered as above  1. SBO (small bowel obstruction)         Mirian Mo, MD 03/21/15 [phone]

## 2015-03-21 NOTE — ED Notes (Signed)
Out with carelink, via stretcher, pt stood and pivoted, NAD, calm, interactive, wife with belongings, wedding band with pt.

## 2015-03-21 NOTE — ED Notes (Addendum)
Pt states that he [facility] with a bowel obstruction. States pain/pressure type pain has increased since being d/c from the hospital. Denies any n/v. Denies any fevers.  States last BM was prior to leaving the hospital.

## 2015-03-21 NOTE — Progress Notes (Signed)
[facility]  [address] [place], [place] [address] Phone: [phone] FAX: [phone]    James Barber [id number] 1946/08/03  CARE TEAM:  PCP: Mia Creek, MD  Outpatient Care Team: Patient Care Team: Mia Creek, MD as PCP - General (Unknown Physician Specialty) Kristeen Miss, MD as Consulting Physician (Neurosurgery) Ladene Artist, MD as Consulting Physician (Gastroenterology)  Inpatient Treatment Team: Treatment Team: Attending Provider: Md Edison Pace, MD; Registered Nurse: Lolita Rieger, RN  Problem List:   Principal Problem:   Recurrent SBO (small bowel obstruction) Active Problems:   Bilateral inguinal hernia (BIH), Left > Right   Incisional hernia, without obstruction or gangrene        Assessment  Recurrent SBO  Plan:  -NGT to better decompress abdomen -palliative nausea/pain - not severe -IVF & rehydrate. -VTE prophylaxis- SCDs, etc -mobilize as tolerated to help recovery  OR exploration if not better by tomorrow - VERY LIKELY.  Suspect may need bowel resection for anastomotic stricture:  The anatomy & physiology of the digestive tract was discussed.  The pathophysiology of intestinal obstruction was discussed.  Natural history risks without surgery was discussed.   I feel the patient has failed non-operative therapies.  The risks of no intervention will lead to serious problems such as necrosis, perforation, dehydration, etc. that outweigh the operative risks; therefore, I recommended abdominal exploration to diagnose & treat the source of the problem.  Minimally Invasive & open techniques were discussed.   I expressed a good likelihood that surgery will treat the problem.  Risks such as bleeding, infection, abscess, leak, reoperation, bowel resection, possible ostomy, hernia, heart attack, death, and other risks were discussed.   I noted a good likelihood this will help address the problem.  Goals of  post-operative recovery were discussed as well.  We will work to minimize complications. Questions were answered.  The patient expresses understanding & wishes to proceed with surgery.   [name], M.D., F.A.C.S. Gastrointestinal and Minimally Invasive Surgery [facility] [address], [place] [address] [phone] Main / Paging   03/21/2015  Subjective:  Better but still w nausea Distended Wife in room RNs coming in room  Objective:  Vital signs:  Filed Vitals:   03/21/15 0445 03/21/15 0500 03/21/15 0515 03/21/15 0614  BP: 133/80 128/69 140/79 157/81  Pulse: 75 71 70 75  Temp:    97.7 F (36.5 C)  TempSrc:    Oral  Resp: _0 Height:    6' (1.829 m)  Weight:    85.1 kg (187 lb 9.8 oz)  SpO2: 95% 94% 95% 96%       Intake/Output   Yesterday:    This shift:     Bowel function:  Flatus: n  BM: n  Drain: n/a  Physical Exam:  General: Pt awake/alert/oriented x4 in no acute distress Eyes: PERRL, normal EOM.  Sclera clear.  No icterus Neuro: CN II-XII intact w/o focal sensory/motor deficits. Lymph: No head/neck/groin lymphadenopathy Psych:  No delerium/psychosis/paranoia HENT: Normocephalic, Mucus membranes moist.  No thrush Neck: Supple, No tracheal deviation Chest: No chest wall pain w good excursion CV:  Pulses intact.  Regular rhythm MS: Normal AROM mjr joints.  No obvious deformity Abdomen: Soft.  Very distended.  Small incarcerated periumbilical incisional hernia - unchanged.  Mildly tender at central abdomen only.  No evidence of peritonitis.  No fluctuance/erythema.. GU.  NEMG.  Small LIH reducible Ext:  SCDs BLE.  No mjr edema.  No cyanosis Skin: No petechiae / purpura  Results:   Labs: Results for orders placed or performed during the hospital encounter of 03/21/15 (from the past 48 hour(s))  CBC with Differential     Status: None   Collection Time: 03/21/15  1:55 AM  Result Value  Ref Range   WBC 6.5 4.0 - 10.5 K/uL   RBC 4.30 4.22 - 5.81 MIL/uL   Hemoglobin 13.8 13.0 - 17.0 g/dL   HCT 39.8 39.0 - 52.0 %   MCV 92.6 78.0 - 100.0 fL   MCH 32.1 26.0 - 34.0 pg   MCHC 34.7 30.0 - 36.0 g/dL   RDW 12.8 11.5 - 15.5 %   Platelets 355 150 - 400 K/uL   Neutrophils Relative % 75 43 - 77 %   Neutro Abs 4.9 1.7 - 7.7 K/uL   Lymphocytes Relative 13 12 - 46 %   Lymphs Abs 0.9 0.7 - 4.0 K/uL   Monocytes Relative 9 3 - 12 %   Monocytes Absolute 0.6 0.1 - 1.0 K/uL   Eosinophils Relative 2 0 - 5 %   Eosinophils Absolute 0.1 0.0 - 0.7 K/uL   Basophils Relative 1 0 - 1 %   Basophils Absolute 0.0 0.0 - 0.1 K/uL  Comprehensive metabolic panel     Status: Abnormal   Collection Time: 03/21/15  1:55 AM  Result Value Ref Range   Sodium 132 (L) 135 - 145 mmol/L   Potassium 3.4 (L) 3.5 - 5.1 mmol/L   Chloride 99 96 - 112 mmol/L   CO2 22 19 - 32 mmol/L   Glucose, Bld 161 (H) 70 - 99 mg/dL   BUN 13 6 - 23 mg/dL   Creatinine, Ser 1.16 0.50 - 1.35 mg/dL   Calcium 9.0 8.4 - 10.5 mg/dL   Total Protein 7.5 6.0 - 8.3 g/dL   Albumin 3.7 3.5 - 5.2 g/dL   AST 79 (H) 0 - 37 U/L   ALT 97 (H) 0 - 53 U/L   Alkaline Phosphatase 154 (H) 39 - 117 U/L   Total Bilirubin 1.1 0.3 - 1.2 mg/dL   GFR calc non Af Amer 64 (L) >90 mL/min   GFR calc Af Amer 74 (L) >90 mL/min    Comment: (NOTE) The eGFR has been calculated using the CKD EPI equation. This calculation has not been validated in all clinical situations. eGFR's persistently <90 mL/min signify possible Chronic Kidney Disease.    Anion gap 11 5 - 15  Lipase, blood     Status: None   Collection Time: 03/21/15  1:55 AM  Result Value Ref Range   Lipase 51 11 - 59 U/L  I-Stat CG4 Lactic Acid, ED     Status: None   Collection Time: 03/21/15  2:18 AM  Result Value Ref Range   Lactic Acid, Venous 1.16 0.5 - 2.0 mmol/L    Imaging / Studies: Dg Abd 1 View  03/21/2015   CLINICAL DATA:  Abdominal pain. Recent hospital admission, discharge  yesterday morning post bowel obstruction. Increasing abdominal pain since hospital discharge.  EXAM: ABDOMEN - 1 VIEW  COMPARISON:  Radiographs 03/18/2015  FINDINGS: Progressive small bowel dilatation in the central abdomen compared to prior exam. Enteric chain sutures noted in the right mid abdomen. Air seen throughout the colon. No evidence of free intra-abdominal air or pneumatosis.  IMPRESSION: Progressive gaseous distention of small bowel, concerning for recurrent small bowel obstruction.   Electronically Signed  By: Jeb Levering M.D.   On: 03/21/2015 02:25    Medications / Allergies: per chart  Antibiotics: Anti-infectives    Start     Dose/Rate Route Frequency Ordered Stop   03/22/15 0600  cefoTEtan (CEFOTAN) 2 g in dextrose 5 % 50 mL IVPB    Comments:  Pharmacy may adjust dose strength for optimal dosing.   Send with patient on call to the OR.  Anesthesia to complete antibiotic administration <62mn [facility]   2 g 100 mL/hr over 30 Minutes Intravenous On call to O.R. 03/21/15 0805 03/23/15 0559       Note: Portions of this report may have been transcribed using voice recognition software. Every effort was made to ensure accuracy; however, inadvertent computerized transcription errors may be present.   Any transcriptional errors that result from this process are unintentional.     SAdin Hector M.D., F.A.C.S. Gastrointestinal and Minimally Invasive Surgery [facility] [address] [place] [phone] / Paging   03/21/2015

## 2015-03-21 NOTE — H&P (Signed)
James Barber is an 69 y.o. male.   Chief Complaint:  Recurrent abdominal pain and distention HPI: he was discharged from the hospital yesterday after being treated medically for partial small bowel obstruction. At 5 PM yesterday he began developing similar pain with distention persisted throughout the day and worsened. No nausea or vomiting. He has been passing a little gas. He went to the [facility] in [facility] where an x-ray demonstrated increased gas gaseous distention of the small bowel versus a previous x-ray done here. He subsequently was transferred here for further treatment.  Past Medical History  Diagnosis Date  . Hypertension   . GERD (gastroesophageal reflux disease)     takes  otc every  other day  . Bowel obstruction   . Diabetes mellitus without complication     Past Surgical History  Procedure Laterality Date  . Abdominal surgeries-exploratory lap with small bowel resection and ileostomy; ileostomy closure; exploratory laparotomy for small bowel obstruction    . Back surgery    . Cervical fusion      c 3-4, C 4-5, C5-6  . Hernia repair      right inguinal  . Anterior cervical decomp/discectomy fusion  11/12/2012    Procedure: ANTERIOR CERVICAL DECOMPRESSION/DISCECTOMY FUSION 2 LEVELS;  Surgeon: Kristeen Miss, MD;  Location: [facility];  Service: Neurosurgery;  Laterality: N/A;  Right Side approach Cervical six-seven,Cervical seven -thoracic one Anterior cervical decompression/diskectomy/fusion    No family history on file. Social History:  reports that he has never smoked. He does not have any smokeless tobacco history on file. He reports that he does not drink alcohol or use illicit drugs.  Allergies: No Known Allergies   Prior to Admission medications   Medication Sig Start Date End Date Taking? Authorizing Provider  aspirin EC 81 MG tablet Take 81 mg by mouth daily.   Yes Historical Provider, MD  cyclobenzaprine (FLEXERIL) 10 MG tablet Take 10 mg by mouth  daily as needed for muscle spasms.   Yes Historical Provider, MD  diphenhydrAMINE (BENADRYL) 25 MG tablet Take 25 mg by mouth at bedtime.   Yes Historical Provider, MD  ferrous sulfate 325 (65 FE) MG tablet Take 325 mg by mouth daily with breakfast.   Yes Historical Provider, MD  gabapentin (NEURONTIN) 400 MG capsule Take 1,200 mg by mouth 3 (three) times daily.    Yes Historical Provider, MD  levothyroxine (SYNTHROID, LEVOTHROID) 50 MCG tablet Take 50 mcg by mouth daily before breakfast.   Yes Historical Provider, MD  lisinopril (PRINIVIL,ZESTRIL) 20 MG tablet Take 20 mg by mouth daily.   Yes Historical Provider, MD  Multiple Vitamin (MULTIVITAMIN WITH MINERALS) TABS tablet Take 1 tablet by mouth daily.   Yes Historical Provider, MD  nabumetone (RELAFEN) 750 MG tablet Take 750 mg by mouth 2 (two) times daily.   Yes Historical Provider, MD  omeprazole (PRILOSEC OTC) 20 MG tablet Take 20 mg by mouth every other day.   Yes Historical Provider, MD  oxyCODONE-acetaminophen (PERCOCET/ROXICET) 5-325 MG per tablet Take 1 tablet by mouth every 4 (four) hours as needed for pain.   Yes Historical Provider, MD  Polyethylene Glycol 400 0.25 % SOLN Apply 1 drop to eye 2 (two) times daily as needed (dry eyes).   Yes Historical Provider, MD  pseudoephedrine-acetaminophen (TYLENOL SINUS) 30-500 MG TABS Take 2 tablets by mouth every morning.   Yes Historical Provider, MD  rosuvastatin (CRESTOR) 10 MG tablet Take 10 mg by mouth daily.   Yes  Historical Provider, MD     Medications Prior to Admission  Medication Sig Dispense Refill  . aspirin EC 81 MG tablet Take 81 mg by mouth daily.    . cyclobenzaprine (FLEXERIL) 10 MG tablet Take 10 mg by mouth daily as needed for muscle spasms.    . diphenhydrAMINE (BENADRYL) 25 MG tablet Take 25 mg by mouth at bedtime.    . ferrous sulfate 325 (65 FE) MG tablet Take 325 mg by mouth daily with breakfast.    . gabapentin (NEURONTIN) 400 MG capsule Take 1,200 mg by mouth 3  (three) times daily.     Marland Kitchen levothyroxine (SYNTHROID, LEVOTHROID) 50 MCG tablet Take 50 mcg by mouth daily before breakfast.    . lisinopril (PRINIVIL,ZESTRIL) 20 MG tablet Take 20 mg by mouth daily.    . Multiple Vitamin (MULTIVITAMIN WITH MINERALS) TABS tablet Take 1 tablet by mouth daily.    . nabumetone (RELAFEN) 750 MG tablet Take 750 mg by mouth 2 (two) times daily.    Marland Kitchen omeprazole (PRILOSEC OTC) 20 MG tablet Take 20 mg by mouth every other day.    . oxyCODONE-acetaminophen (PERCOCET/ROXICET) 5-325 MG per tablet Take 1 tablet by mouth every 4 (four) hours as needed for pain.    . Polyethylene Glycol 400 0.25 % SOLN Apply 1 drop to eye 2 (two) times daily as needed (dry eyes).    . pseudoephedrine-acetaminophen (TYLENOL SINUS) 30-500 MG TABS Take 2 tablets by mouth every morning.    . rosuvastatin (CRESTOR) 10 MG tablet Take 10 mg by mouth daily.      Results for orders placed or performed during the hospital encounter of 03/21/15 (from the past 48 hour(s))  CBC with Differential     Status: None   Collection Time: 03/21/15  1:55 AM  Result Value Ref Range   WBC 6.5 4.0 - 10.5 K/uL   RBC 4.30 4.22 - 5.81 MIL/uL   Hemoglobin 13.8 13.0 - 17.0 g/dL   HCT 39.8 39.0 - 52.0 %   MCV 92.6 78.0 - 100.0 fL   MCH 32.1 26.0 - 34.0 pg   MCHC 34.7 30.0 - 36.0 g/dL   RDW 12.8 11.5 - 15.5 %   Platelets 355 150 - 400 K/uL   Neutrophils Relative % 75 43 - 77 %   Neutro Abs 4.9 1.7 - 7.7 K/uL   Lymphocytes Relative 13 12 - 46 %   Lymphs Abs 0.9 0.7 - 4.0 K/uL   Monocytes Relative 9 3 - 12 %   Monocytes Absolute 0.6 0.1 - 1.0 K/uL   Eosinophils Relative 2 0 - 5 %   Eosinophils Absolute 0.1 0.0 - 0.7 K/uL   Basophils Relative 1 0 - 1 %   Basophils Absolute 0.0 0.0 - 0.1 K/uL  Comprehensive metabolic panel     Status: Abnormal   Collection Time: 03/21/15  1:55 AM  Result Value Ref Range   Sodium 132 (L) 135 - 145 mmol/L   Potassium 3.4 (L) 3.5 - 5.1 mmol/L   Chloride 99 96 - 112 mmol/L   CO2  22 19 - 32 mmol/L   Glucose, Bld 161 (H) 70 - 99 mg/dL   BUN 13 6 - 23 mg/dL   Creatinine, Ser 1.16 0.50 - 1.35 mg/dL   Calcium 9.0 8.4 - 10.5 mg/dL   Total Protein 7.5 6.0 - 8.3 g/dL   Albumin 3.7 3.5 - 5.2 g/dL   AST 79 (H) 0 - 37 U/L   ALT 97 (H)  0 - 53 U/L   Alkaline Phosphatase 154 (H) 39 - 117 U/L   Total Bilirubin 1.1 0.3 - 1.2 mg/dL   GFR calc non Af Amer 64 (L) >90 mL/min   GFR calc Af Amer 74 (L) >90 mL/min    Comment: (NOTE) The eGFR has been calculated using the CKD EPI equation. This calculation has not been validated in all clinical situations. eGFR's persistently <90 mL/min signify possible Chronic Kidney Disease.    Anion gap 11 5 - 15  Lipase, blood     Status: None   Collection Time: 03/21/15  1:55 AM  Result Value Ref Range   Lipase 51 11 - 59 U/L  I-Stat CG4 Lactic Acid, ED     Status: None   Collection Time: 03/21/15  2:18 AM  Result Value Ref Range   Lactic Acid, Venous 1.16 0.5 - 2.0 mmol/L   Dg Abd 1 View  03/21/2015   CLINICAL DATA:  Abdominal pain. Recent hospital admission, discharge yesterday morning post bowel obstruction. Increasing abdominal pain since hospital discharge.  EXAM: ABDOMEN - 1 VIEW  COMPARISON:  Radiographs 03/18/2015  FINDINGS: Progressive small bowel dilatation in the central abdomen compared to prior exam. Enteric chain sutures noted in the right mid abdomen. Air seen throughout the colon. No evidence of free intra-abdominal air or pneumatosis.  IMPRESSION: Progressive gaseous distention of small bowel, concerning for recurrent small bowel obstruction.   Electronically Signed   By: Jeb Levering M.D.   On: 03/21/2015 02:25    Review of Systems  Constitutional: Negative for fever and chills.  Respiratory: Negative for shortness of breath.   Cardiovascular: Negative for chest pain.  Gastrointestinal: Positive for abdominal pain. Negative for nausea and vomiting.  Neurological: Positive for focal weakness (chronic in right hand).  Negative for headaches.    Blood pressure 157/81, pulse 75, temperature 97.7 F (36.5 C), temperature source Oral, resp. rate 18, height 6' (1.829 m), weight 85.1 kg (187 lb 9.8 oz), SpO2 96 %. Physical Exam  Constitutional: He appears well-developed and well-nourished. No distress.  HENT:  Head: Normocephalic and atraumatic.  Dry mucous membranes  Eyes: No scleral icterus.  Cardiovascular: Normal rate and regular rhythm.   Respiratory: Effort normal and breath sounds normal.  GI: Soft. He exhibits distension. There is no tenderness.  Hyperactive bowel sounds  Genitourinary:  No inguinal bulges  Musculoskeletal: He exhibits no edema.  Right hand contracted  Skin: Skin is warm and dry.  Psychiatric: He has a normal mood and affect. His behavior is normal.     Assessment/Plan Recurrent small bowel obstruction. He essentially has failed medical management at this time. Does have significant distention now as well as some mild electrolyte abnormalities.  Plan: Admit to the hospital. IV fluid hydration and correct electrolyte abnormalities. Place NG tube to low wall suction. I recommend he undergo exploratory laparotomy for his recurrent small bowel obstruction this admission and he and his wife are in agreement. We will decompress him and correct his electrolyte abnormalities. He does not have an acute abdomen.  Imani Fiebelkorn J 03/21/2015, 7:45 AM

## 2015-03-21 NOTE — ED Notes (Signed)
"  feel better now", Dr. Littie DeedsGentry in to see pt, pt placed on monitor, VSS, alert, NAD, calm, interactive, nausea resolved[facility]

## 2015-03-21 NOTE — ED Notes (Signed)
Dr. Littie DeedsGentry in to speak with pt.

## 2015-03-21 NOTE — ED Notes (Signed)
I placed patient on the monitor, five lead ECG. Patient stated pain eased off somewhat after i.v. Drugs.

## 2015-03-21 NOTE — Progress Notes (Signed)
CARE MANAGEMENT NOTE 03/21/2015  Patient:  James Barber,James Barber   Account Number:  [id number]  Date Initiated:  03/21/2015  Documentation initiated by:  DAVIS,RHONDA  Subjective/Objective Assessment:   pt with recurrence of sbo less than 24 hours after dcd to home.  sbo confirmed by films     Action/Plan:   home when stable   Anticipated DC Date:  03/24/2015   Anticipated DC Plan:  HOME/SELF CARE  In-house referral  NA      DC Planning Services  NA      Choice offered to / List presented to:             Status of service:  In process, will continue to follow Medicare Important Message given?   (If response is "NO", the following Medicare IM given date fields will be blank) Date Medicare IM given:   Medicare IM given by:   Date Additional Medicare IM given:   Additional Medicare IM given by:    Discharge Disposition:    Per UR Regulation:  Reviewed for med. necessity/level of care/duration of stay  If discussed at Long Length of Stay Meetings, dates discussed:    Comments:  March 21, 2015/Rhonda L. Earlene Plateravis, RN, BSN, CCM. Case Management [employer] [phone] No discharge needs present of time of review.

## 2015-03-21 NOTE — ED Notes (Signed)
[facility] in to room prior to RN assessment, see MD notes, pending orders.

## 2015-03-22 ENCOUNTER — Encounter (HOSPITAL_COMMUNITY): Payer: Self-pay | Admitting: Certified Registered Nurse Anesthetist

## 2015-03-22 ENCOUNTER — Inpatient Hospital Stay (HOSPITAL_COMMUNITY): Payer: Medicare Other | Admitting: Certified Registered Nurse Anesthetist

## 2015-03-22 ENCOUNTER — Inpatient Hospital Stay (HOSPITAL_COMMUNITY): Payer: Medicare Other

## 2015-03-22 ENCOUNTER — Encounter (HOSPITAL_COMMUNITY): Admission: EM | Disposition: A | Discharge status: Home / Self Care | Payer: Self-pay | Source: Home / Self Care

## 2015-03-22 HISTORY — PX: LAPAROTOMY: SHX154

## 2015-03-22 LAB — BASIC METABOLIC PANEL
ANION GAP: 5 (ref 5–15)
BUN: 9 mg/dL (ref 6–23)
CO2: 25 mmol/L (ref 19–32)
Calcium: 8.6 mg/dL (ref 8.4–10.5)
Chloride: 104 mmol/L (ref 96–112)
Creatinine, Ser: 1.22 mg/dL (ref 0.50–1.35)
GFR calc Af Amer: 70 mL/min — ABNORMAL LOW (ref >90)
GFR calc non Af Amer: 60 mL/min — ABNORMAL LOW (ref >90)
Glucose, Bld: 163 mg/dL — ABNORMAL HIGH (ref 70–99)
Potassium: 3.9 mmol/L (ref 3.5–5.1)
SODIUM: 134 mmol/L — AB (ref 135–145)

## 2015-03-22 LAB — CBC
HCT: 36.9 % — ABNORMAL LOW (ref 39.0–52.0)
Hemoglobin: 12.4 g/dL — ABNORMAL LOW (ref 13.0–17.0)
MCH: 32 pg (ref 26.0–34.0)
MCHC: 33.6 g/dL (ref 30.0–36.0)
MCV: 95.3 fL (ref 78.0–100.0)
Platelets: 348 10*3/uL (ref 150–400)
RBC: 3.87 MIL/uL — AB (ref 4.22–5.81)
RDW: 13.6 % (ref 11.5–15.5)
WBC: 6.9 10*3/uL (ref 4.0–10.5)

## 2015-03-22 LAB — URINALYSIS, ROUTINE W REFLEX MICROSCOPIC
Bilirubin Urine: NEGATIVE
Glucose, UA: NEGATIVE mg/dL
Hgb urine dipstick: NEGATIVE
Ketones, ur: NEGATIVE mg/dL
Leukocytes, UA: NEGATIVE
Nitrite: NEGATIVE
PH: 6 (ref 5.0–8.0)
Protein, ur: NEGATIVE mg/dL
SPECIFIC GRAVITY, URINE: 1.012 (ref 1.005–1.030)
Urobilinogen, UA: 1 mg/dL (ref 0.0–1.0)

## 2015-03-22 LAB — SURGICAL PCR SCREEN
MRSA, PCR: NEGATIVE
Staphylococcus aureus: NEGATIVE

## 2015-03-22 SURGERY — LAPAROTOMY, EXPLORATORY
Anesthesia: General

## 2015-03-22 MED ORDER — ONDANSETRON HCL 4 MG/2ML IJ SOLN: 4.0000 mg | Freq: Once | INTRAMUSCULAR | Status: DC | PRN

## 2015-03-22 MED ORDER — DIPHENHYDRAMINE HCL 50 MG/ML IJ SOLN: 12.5000 mg | Freq: Four times a day (QID) | INTRAMUSCULAR | Status: DC | PRN

## 2015-03-22 MED ORDER — SODIUM CHLORIDE 0.9 % IR SOLN
Status: DC | PRN
  Administered 2015-03-22: 1000 mL

## 2015-03-22 MED ORDER — PROPOFOL 10 MG/ML IV BOLUS
INTRAVENOUS | Status: AC
  Filled 2015-03-22: qty 20

## 2015-03-22 MED ORDER — LIDOCAINE HCL (CARDIAC) 20 MG/ML IV SOLN
INTRAVENOUS | Status: AC
  Filled 2015-03-22: qty 5

## 2015-03-22 MED ORDER — DEXAMETHASONE SODIUM PHOSPHATE 10 MG/ML IJ SOLN
INTRAMUSCULAR | Status: DC | PRN
  Administered 2015-03-22: 10 mg via INTRAVENOUS

## 2015-03-22 MED ORDER — DEXTROSE 5 % IV SOLN
2.0000 g | Freq: Once | INTRAVENOUS | Status: AC
  Administered 2015-03-22: 2 g via INTRAVENOUS
  Filled 2015-03-22: qty 2

## 2015-03-22 MED ORDER — FENTANYL CITRATE (PF) 100 MCG/2ML IJ SOLN
INTRAMUSCULAR | Status: DC | PRN
  Administered 2015-03-22 (×5): 50 ug via INTRAVENOUS

## 2015-03-22 MED ORDER — SUCCINYLCHOLINE CHLORIDE 20 MG/ML IJ SOLN
INTRAMUSCULAR | Status: DC | PRN
  Administered 2015-03-22: 100 mg via INTRAVENOUS

## 2015-03-22 MED ORDER — GLYCOPYRROLATE 0.2 MG/ML IJ SOLN
INTRAMUSCULAR | Status: AC
  Filled 2015-03-22: qty 3

## 2015-03-22 MED ORDER — MORPHINE SULFATE 10 MG/ML IJ SOLN
4.0000 mg | INTRAMUSCULAR | Status: DC | PRN
  Administered 2015-03-22: 2 mg via INTRAVENOUS

## 2015-03-22 MED ORDER — MIDAZOLAM HCL 2 MG/2ML IJ SOLN
INTRAMUSCULAR | Status: AC
  Filled 2015-03-22: qty 2

## 2015-03-22 MED ORDER — ONDANSETRON HCL 4 MG/2ML IJ SOLN
INTRAMUSCULAR | Status: AC
  Filled 2015-03-22: qty 2

## 2015-03-22 MED ORDER — DEXAMETHASONE SODIUM PHOSPHATE 10 MG/ML IJ SOLN
INTRAMUSCULAR | Status: AC
  Filled 2015-03-22: qty 1

## 2015-03-22 MED ORDER — MORPHINE SULFATE 10 MG/ML IJ SOLN
INTRAMUSCULAR | Status: AC
  Administered 2015-03-22: 15:00:00
  Filled 2015-03-22: qty 1

## 2015-03-22 MED ORDER — NEOSTIGMINE METHYLSULFATE 10 MG/10ML IV SOLN
INTRAVENOUS | Status: DC | PRN
  Administered 2015-03-22: 5 mg via INTRAVENOUS

## 2015-03-22 MED ORDER — NALOXONE HCL 0.4 MG/ML IJ SOLN: 0.4000 mg | INTRAMUSCULAR | Status: DC | PRN

## 2015-03-22 MED ORDER — SODIUM CHLORIDE 0.9 % IJ SOLN
INTRAMUSCULAR | Status: AC
  Filled 2015-03-22: qty 10

## 2015-03-22 MED ORDER — CEFOTETAN DISODIUM-DEXTROSE 2-2.08 GM-% IV SOLR
INTRAVENOUS | Status: AC
  Filled 2015-03-22: qty 50

## 2015-03-22 MED ORDER — MORPHINE SULFATE (PF) 1 MG/ML IV SOLN
INTRAVENOUS | Status: DC
  Administered 2015-03-22: 13:00:00 via INTRAVENOUS
  Administered 2015-03-22: 1.5 mg via INTRAVENOUS
  Administered 2015-03-22 – 2015-03-23 (×2): 3 mg via INTRAVENOUS
  Administered 2015-03-23: 05:00:00 via INTRAVENOUS
  Administered 2015-03-23 (×2): 15 mg via INTRAVENOUS
  Administered 2015-03-23: 4.5 mg via INTRAVENOUS
  Administered 2015-03-23: 9 mg via INTRAVENOUS
  Administered 2015-03-23: 1.5 mg via INTRAVENOUS
  Administered 2015-03-23: 14:00:00 via INTRAVENOUS
  Administered 2015-03-23: 7.95 mg via INTRAVENOUS
  Administered 2015-03-24: 02:00:00 via INTRAVENOUS
  Administered 2015-03-24: 10.5 mg via INTRAVENOUS
  Administered 2015-03-24: 27 mg via INTRAVENOUS
  Administered 2015-03-24 (×2): 1.5 mg via INTRAVENOUS
  Administered 2015-03-24: 11:00:00 via INTRAVENOUS
  Administered 2015-03-25 (×2): 4.5 mg via INTRAVENOUS
  Administered 2015-03-25: 14:00:00 via INTRAVENOUS
  Administered 2015-03-25: 4.5 mg via INTRAVENOUS
  Administered 2015-03-25: 6 mg via INTRAVENOUS
  Administered 2015-03-26: 0 mg via INTRAVENOUS
  Administered 2015-03-26: 1.5 mg via INTRAVENOUS
  Administered 2015-03-26: 0 mg via INTRAVENOUS
  Administered 2015-03-26: 0 via INTRAVENOUS
  Administered 2015-03-27: 3 mg via INTRAVENOUS
  Administered 2015-03-27: 1.5 mg via INTRAVENOUS
  Administered 2015-03-27: 0 mg via INTRAVENOUS
  Administered 2015-03-27: 01:00:00 via INTRAVENOUS
  Filled 2015-03-22 (×6): qty 25

## 2015-03-22 MED ORDER — GLYCOPYRROLATE 0.2 MG/ML IJ SOLN
INTRAMUSCULAR | Status: DC | PRN
  Administered 2015-03-22: 0.6 mg via INTRAVENOUS

## 2015-03-22 MED ORDER — ROCURONIUM BROMIDE 100 MG/10ML IV SOLN
INTRAVENOUS | Status: AC
  Filled 2015-03-22: qty 1

## 2015-03-22 MED ORDER — ROCURONIUM BROMIDE 100 MG/10ML IV SOLN
INTRAVENOUS | Status: DC | PRN
  Administered 2015-03-22 (×3): 10 mg via INTRAVENOUS
  Administered 2015-03-22: 20 mg via INTRAVENOUS
  Administered 2015-03-22: 40 mg via INTRAVENOUS

## 2015-03-22 MED ORDER — EPHEDRINE SULFATE 50 MG/ML IJ SOLN
INTRAMUSCULAR | Status: AC
  Filled 2015-03-22: qty 1

## 2015-03-22 MED ORDER — BUPIVACAINE-EPINEPHRINE (PF) 0.25% -1:200000 IJ SOLN
INTRAMUSCULAR | Status: DC | PRN
  Administered 2015-03-22: 30 mL via PERINEURAL

## 2015-03-22 MED ORDER — LACTATED RINGERS IV SOLN
INTRAVENOUS | Status: DC
  Administered 2015-03-22 (×2): via INTRAVENOUS
  Administered 2015-03-22: 1000 mL via INTRAVENOUS

## 2015-03-22 MED ORDER — MIDAZOLAM HCL 5 MG/5ML IJ SOLN
INTRAMUSCULAR | Status: DC | PRN
  Administered 2015-03-22: 2 mg via INTRAVENOUS

## 2015-03-22 MED ORDER — SODIUM CHLORIDE 0.9 % IJ SOLN: 9.0000 mL | INTRAMUSCULAR | Status: DC | PRN

## 2015-03-22 MED ORDER — DIPHENHYDRAMINE HCL 12.5 MG/5ML PO ELIX: 12.5000 mg | ORAL_SOLUTION | Freq: Four times a day (QID) | ORAL | Status: DC | PRN

## 2015-03-22 MED ORDER — FENTANYL CITRATE (PF) 250 MCG/5ML IJ SOLN
INTRAMUSCULAR | Status: AC
  Filled 2015-03-22: qty 5

## 2015-03-22 MED ORDER — ONDANSETRON HCL 4 MG/2ML IJ SOLN
INTRAMUSCULAR | Status: DC | PRN
  Administered 2015-03-22: 4 mg via INTRAVENOUS

## 2015-03-22 MED ORDER — BUPIVACAINE-EPINEPHRINE (PF) 0.25% -1:200000 IJ SOLN
INTRAMUSCULAR | Status: AC
  Filled 2015-03-22: qty 30

## 2015-03-22 MED ORDER — PROPOFOL 10 MG/ML IV BOLUS
INTRAVENOUS | Status: DC | PRN
  Administered 2015-03-22: 150 mg via INTRAVENOUS

## 2015-03-22 MED ORDER — HYDROMORPHONE HCL 1 MG/ML IJ SOLN
INTRAMUSCULAR | Status: DC | PRN
  Administered 2015-03-22 (×4): 0.5 mg via INTRAVENOUS

## 2015-03-22 MED ORDER — MORPHINE SULFATE (PF) 1 MG/ML IV SOLN
INTRAVENOUS | Status: AC
  Filled 2015-03-22: qty 25

## 2015-03-22 MED ORDER — ENOXAPARIN SODIUM 40 MG/0.4ML ~~LOC~~ SOLN
40.0000 mg | SUBCUTANEOUS | Status: DC
  Administered 2015-03-23 – 2015-03-30 (×8): 40 mg via SUBCUTANEOUS
  Filled 2015-03-22 (×10): qty 0.4

## 2015-03-22 MED ORDER — MEPERIDINE HCL 50 MG/ML IJ SOLN: 6.2500 mg | INTRAMUSCULAR | Status: DC | PRN

## 2015-03-22 MED ORDER — ONDANSETRON HCL 4 MG/2ML IJ SOLN: 4.0000 mg | Freq: Four times a day (QID) | INTRAMUSCULAR | Status: DC | PRN

## 2015-03-22 MED ORDER — LIDOCAINE HCL (CARDIAC) 20 MG/ML IV SOLN
INTRAVENOUS | Status: DC | PRN
  Administered 2015-03-22: 100 mg via INTRAVENOUS

## 2015-03-22 MED ORDER — HYDROMORPHONE HCL 2 MG/ML IJ SOLN
INTRAMUSCULAR | Status: AC
  Filled 2015-03-22: qty 1

## 2015-03-22 SURGICAL SUPPLY — 55 items
APPLICATOR COTTON TIP 6IN STRL (MISCELLANEOUS) IMPLANT
APPLIER CLIP 5 13 M/L LIGAMAX5 (MISCELLANEOUS) ×3
BLADE EXTENDED COATED 6.5IN (ELECTRODE) IMPLANT
BLADE HEX COATED 2.75 (ELECTRODE) ×3 IMPLANT
CABLE HIGH FREQUENCY MONO STRZ (ELECTRODE) ×3 IMPLANT
CELLS DAT CNTRL 66122 CELL SVR (MISCELLANEOUS) ×1 IMPLANT
CHLORAPREP W/TINT 26ML (MISCELLANEOUS) ×3 IMPLANT
CLIP APPLIE 5 13 M/L LIGAMAX5 (MISCELLANEOUS) ×1 IMPLANT
COVER MAYO STAND STRL (DRAPES) ×3 IMPLANT
DRAPE LAPAROSCOPIC ABDOMINAL (DRAPES) ×3 IMPLANT
DRAPE UTILITY XL STRL (DRAPES) ×3 IMPLANT
DRAPE WARM FLUID 44X44 (DRAPE) ×3 IMPLANT
DRSG OPSITE POSTOP 4X6 (GAUZE/BANDAGES/DRESSINGS) ×3 IMPLANT
ELECT REM PT RETURN 9FT ADLT (ELECTROSURGICAL) ×3
ELECTRODE REM PT RTRN 9FT ADLT (ELECTROSURGICAL) ×1 IMPLANT
GAUZE SPONGE 4X4 12PLY STRL (GAUZE/BANDAGES/DRESSINGS) IMPLANT
GLOVE BIOGEL M STRL SZ7.5 (GLOVE) IMPLANT
GLOVE BIOGEL PI IND STRL 7.0 (GLOVE) ×1 IMPLANT
GLOVE BIOGEL PI INDICATOR 7.0 (GLOVE) ×2
GLOVE ECLIPSE 7.5 STRL STRAW (GLOVE) ×24 IMPLANT
GLOVE INDICATOR 8.0 STRL GRN (GLOVE) ×3 IMPLANT
GOWN STRL REUS W/TWL LRG LVL3 (GOWN DISPOSABLE) ×6 IMPLANT
GOWN STRL REUS W/TWL XL LVL3 (GOWN DISPOSABLE) ×6 IMPLANT
KIT BASIN OR (CUSTOM PROCEDURE TRAY) ×3 IMPLANT
LIGASURE IMPACT 36 18CM CVD LR (INSTRUMENTS) ×3 IMPLANT
NS IRRIG 1000ML POUR BTL (IV SOLUTION) ×6 IMPLANT
PACK GENERAL/GYN (CUSTOM PROCEDURE TRAY) ×3 IMPLANT
PENCIL BUTTON HOLSTER BLD 10FT (ELECTRODE) ×3 IMPLANT
RELOAD PROXIMATE 75MM BLUE (ENDOMECHANICALS) ×9 IMPLANT
RELOAD PROXIMATE TA60MM BLUE (ENDOMECHANICALS) ×3 IMPLANT
RTRCTR WOUND ALEXIS 18CM MED (MISCELLANEOUS) ×3
SCISSORS LAP 5X45 EPIX DISP (ENDOMECHANICALS) ×3 IMPLANT
SET IRRIG TUBING LAPAROSCOPIC (IRRIGATION / IRRIGATOR) ×3 IMPLANT
SLEEVE XCEL OPT CAN 5 100 (ENDOMECHANICALS) ×12 IMPLANT
SPONGE LAP 18X18 X RAY DECT (DISPOSABLE) IMPLANT
STAPLER GUN LINEAR PROX 60 (STAPLE) ×3 IMPLANT
STAPLER PROXIMATE 75MM BLUE (STAPLE) ×3 IMPLANT
STAPLER VISISTAT 35W (STAPLE) IMPLANT
SUCTION POOLE TIP (SUCTIONS) ×3 IMPLANT
SUT MNCRL AB 4-0 PS2 18 (SUTURE) ×3 IMPLANT
SUT NOVA 0 T19/GS 22DT (SUTURE) ×6 IMPLANT
SUT NOVA NAB GS-21 0 18 T12 DT (SUTURE) ×3 IMPLANT
SUT PDS AB 1 CTX 36 (SUTURE) IMPLANT
SUT SILK 2 0 (SUTURE) ×2
SUT SILK 2 0 SH CR/8 (SUTURE) ×3 IMPLANT
SUT SILK 2-0 18XBRD TIE 12 (SUTURE) ×1 IMPLANT
SUT SILK 3 0 (SUTURE) ×2
SUT SILK 3 0 SH CR/8 (SUTURE) ×3 IMPLANT
SUT SILK 3-0 18XBRD TIE 12 (SUTURE) ×1 IMPLANT
SUT VIC AB 2-0 SH 18 (SUTURE) ×3 IMPLANT
TOWEL OR 17X26 10 PK STRL BLUE (TOWEL DISPOSABLE) ×3 IMPLANT
TRAY FOLEY W/METER SILVER 14FR (SET/KITS/TRAYS/PACK) ×3 IMPLANT
TROCAR BLADELESS OPT 5 100 (ENDOMECHANICALS) ×3 IMPLANT
WATER STERILE IRR 1500ML POUR (IV SOLUTION) IMPLANT
YANKAUER SUCT BULB TIP NO VENT (SUCTIONS) ×3 IMPLANT

## 2015-03-22 NOTE — Anesthesia Preprocedure Evaluation (Addendum)
Anesthesia Evaluation  Patient identified by MRN, date of birth, ID band Patient awake    Reviewed: Allergy & Precautions, NPO status , Patient's Chart, lab work & pertinent test results  Airway Mallampati: I  TM Distance: >3 FB Neck ROM: Full    Dental   Pulmonary    Pulmonary exam normal       Cardiovascular hypertension, Pt. on medications     Neuro/Psych    GI/Hepatic GERD-  Medicated and Controlled,  Endo/Other  diabetes  Renal/GU      Musculoskeletal   Abdominal   Peds  Hematology   Anesthesia Other Findings   Reproductive/Obstetrics                            Anesthesia Physical Anesthesia Plan  ASA: III  Anesthesia Plan: General   Post-op Pain Management:    Induction: Intravenous, Rapid sequence and Cricoid pressure planned  Airway Management Planned: Oral ETT  Additional Equipment:   Intra-op Plan:   Post-operative Plan: Extubation in OR  Informed Consent: I have reviewed the patients History and Physical, chart, labs and discussed the procedure including the risks, benefits and alternatives for the proposed anesthesia with the patient or authorized representative who has indicated his/her understanding and acceptance.     Plan Discussed with: CRNA and Surgeon  Anesthesia Plan Comments:         Anesthesia Quick Evaluation

## 2015-03-22 NOTE — Progress Notes (Signed)
Day of Surgery  Subjective: Up in chair and says he feels better some flatus.  Wants NG out before surgery if possible.  Objective: Vital signs in last 24 hours: Temp:  [98.3 F (36.8 C)-98.6 F (37 C)] 98.3 F (36.8 C) (04/18 0523) Pulse Rate:  [69-83] 73 (04/18 0523) Resp:  [16-20] 16 (04/18 0523) BP: (149-167)/(76-84) 149/79 mmHg (04/18 0523) SpO2:  [94 %-99 %] 96 % (04/18 0523) Weight:  [84.1 kg (185 lb 6.5 oz)] 84.1 kg (185 lb 6.5 oz) (04/18 0523)  600 from NG Afebrile, VSS Labs OK Intake/Output from previous day: 04/17 0701 - 04/18 0700 In: 0  Out: 1200 [Urine:600; Emesis/NG output:600] Intake/Output this shift:    General appearance: alert, cooperative and no distress GI: in wheelchair going to xray, no BS, some flatus. Can't examine from here any better than this.  Lab Results:   Recent Labs  03/21/15 0155 03/22/15 0517  WBC 6.5 6.9  HGB 13.8 12.4*  HCT 39.8 36.9*  PLT 355 348    BMET  Recent Labs  03/21/15 0155 03/22/15 0517  NA 132* 134*  K 3.4* 3.9  CL 99 104  CO2 22 25  GLUCOSE 161* 163*  BUN 13 9  CREATININE 1.16 1.22  CALCIUM 9.0 8.6   PT/INR No results for input(s): LABPROT, INR in the last 72 hours.   Recent Labs Lab 03/21/15 0155  AST 79*  ALT 97*  ALKPHOS 154*  BILITOT 1.1  PROT 7.5  ALBUMIN 3.7     Lipase     Component Value Date/Time   LIPASE 51 03/21/2015 0155     Studies/Results: Dg Abd 1 View  03/21/2015   CLINICAL DATA:  Abdominal pain. Recent hospital admission, discharge yesterday morning post bowel obstruction. Increasing abdominal pain since hospital discharge.  EXAM: ABDOMEN - 1 VIEW  COMPARISON:  Radiographs 03/18/2015  FINDINGS: Progressive small bowel dilatation in the central abdomen compared to prior exam. Enteric chain sutures noted in the right mid abdomen. Air seen throughout the colon. No evidence of free intra-abdominal air or pneumatosis.  IMPRESSION: Progressive gaseous distention of small bowel,  concerning for recurrent small bowel obstruction.   Electronically Signed   By: Rubye OaksMelanie  Ehinger M.D.   On: 03/21/2015 02:25    Medications: . cefoTEtan (CEFOTAN) IV  2 g Intravenous On Call to OR  . levothyroxine  25 mcg Intravenous Daily  . lip balm  1 application Topical BID  . pantoprazole (PROTONIX) IV  40 mg Intravenous QHS   . dextrose 5 % and 0.9 % NaCl with KCl 40 mEq/L 125 mL/hr (03/21/15 1710)   Prior to Admission medications   Medication Sig Start Date End Date Taking? Authorizing Provider  aspirin EC 81 MG tablet Take 81 mg by mouth daily.   Yes Historical Provider, MD  cyclobenzaprine (FLEXERIL) 10 MG tablet Take 10 mg by mouth daily as needed for muscle spasms.   Yes Historical Provider, MD  diphenhydrAMINE (BENADRYL) 25 MG tablet Take 25 mg by mouth at bedtime.   Yes Historical Provider, MD  ferrous sulfate 325 (65 FE) MG tablet Take 325 mg by mouth daily with breakfast.   Yes Historical Provider, MD  gabapentin (NEURONTIN) 400 MG capsule Take 1,200 mg by mouth 3 (three) times daily.    Yes Historical Provider, MD  levothyroxine (SYNTHROID, LEVOTHROID) 50 MCG tablet Take 50 mcg by mouth daily before breakfast.   Yes Historical Provider, MD  lisinopril (PRINIVIL,ZESTRIL) 20 MG tablet Take 20 mg by mouth  daily.   Yes Historical Provider, MD  Multiple Vitamin (MULTIVITAMIN WITH MINERALS) TABS tablet Take 1 tablet by mouth daily.   Yes Historical Provider, MD  nabumetone (RELAFEN) 750 MG tablet Take 750 mg by mouth 2 (two) times daily.   Yes Historical Provider, MD  omeprazole (PRILOSEC OTC) 20 MG tablet Take 20 mg by mouth every other day.   Yes Historical Provider, MD  oxyCODONE-acetaminophen (PERCOCET/ROXICET) 5-325 MG per tablet Take 1 tablet by mouth every 4 (four) hours as needed for pain.   Yes Historical Provider, MD  Polyethylene Glycol 400 0.25 % SOLN Apply 1 drop to eye 2 (two) times daily as needed (dry eyes).   Yes Historical Provider, MD   pseudoephedrine-acetaminophen (TYLENOL SINUS) 30-500 MG TABS Take 2 tablets by mouth every morning.   Yes Historical Provider, MD  rosuvastatin (CRESTOR) 10 MG tablet Take 10 mg by mouth daily.   Yes Historical Provider, MD     Assessment/Plan SBO with prior hx of SBO/PSBO,lysis of adhesions AODM Hypertension hypothyroid GERD DVT: SCD   Plan:  Get film and see him after his surgery.    LOS: 1 day    Brittnye Josephs 03/22/2015

## 2015-03-22 NOTE — Brief Op Note (Signed)
03/21/2015 - 03/22/2015  1:16 PM  PATIENT:  James Barber  69 y.o. male  PRE-OPERATIVE DIAGNOSIS:  recurrent small bowel obstruction  POST-OPERATIVE DIAGNOSIS:  recurrent small bowel obstruction  PROCEDURE:  Procedure(s): DIAGONOSTIC LAPAROSCOPY, LAPROSCOPIC LYSIS OF ADHESIONS FOR ONE HOUR, DISTAL SMALL BOWEL RESECTION (N/A)  SURGEON:  Surgeon(s) and Role:    * Gaynelle AduEric Chealsea Paske, MD - Primary  PHYSICIAN ASSISTANT:   ASSISTANTS: none   ANESTHESIA:   general  EBL:  Total I/O In: 2100 [I.V.:2100] Out: 600 [Urine:500; Blood:100]  BLOOD ADMINISTERED:none  DRAINS: Nasogastric Tube and Urinary Catheter (Foley)   LOCAL MEDICATIONS USED:  MARCAINE     SPECIMEN:  Source of Specimen:  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   Delay start of Pharmacological VTE agent (>24hrs) due to surgical blood loss or risk of bleeding: no  Mary SellaEric M. Andrey CampanileWilson, MD, FACS General, Bariatric, & Minimally Invasive Surgery [facility] [place]

## 2015-03-22 NOTE — Transfer of Care (Signed)
Immediate Anesthesia Transfer of Care Note  Patient: James Barber  Procedure(s) Performed: Procedure(s): DIAGONOSTIC LAPAROSCOPY, LAPROSCOPIC LYSIS OF ADHESIONS FOR ONE HOUR, DISTAL SMALL BOWEL RESECTION (N/A)  Patient Location: PACU  Anesthesia Type:General  Level of Consciousness:  sedated, patient cooperative and responds to stimulation  Airway & Oxygen Therapy:Patient Spontanous Breathing and Patient connected to face mask oxgen  Post-op Assessment:  Report given to PACU RN and Post -op Vital signs reviewed and stable  Post vital signs:  Reviewed and stable  Last Vitals:  Filed Vitals:   03/22/15 0523  BP: 149/79  Pulse: 73  Temp: 36.8 C  Resp: 16    Complications: No apparent anesthesia complications

## 2015-03-22 NOTE — Op Note (Signed)
NAMERAYSON, RANDO NO.:  [id number]  MEDICAL RECORD NO.:  [id number]  LOCATION:  1508                         FACILITY:  [facility]  PHYSICIAN:  Mary Sella. Andrey Campanile, MD, FACSDATE OF BIRTH:  1946/03/14  DATE OF PROCEDURE:  03/22/2015 DATE OF DISCHARGE:                              OPERATIVE REPORT   PREOPERATIVE DIAGNOSIS:  Recurrent small-bowel obstruction.  POSTOPERATIVE DIAGNOSIS:  Recurrent small-bowel obstruction.  PROCEDURE: 1. Diagnostic laparoscopy. 2. Laparoscopic lysis of adhesions for 1 hour. 3. Distal small-bowel resection.  SURGEON:  Mary Sella. Andrey Campanile, MD, FACS  ASSISTANT SURGEON:  None.  ANESTHESIA:  General.  EBL:  100 mL.  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  ANESTHESIA:  Local 0.25% Marcaine with epi.  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  He had his ileostomy reversed many years ago.  He was admitted last week with a partial small-bowel obstruction.  CT showed a possible transition near his old anastomosis which was dilated.  He was initially medically managed.  His small-bowel obstruction appeared to have resolved, and he was tolerating liquids and was sent home over the weekend. Unfortunately, he bounced back less than 24 hours after discharge with recurrent abdominal pain, distention, and nausea.  Plain films demonstrated recurrence of partial small-bowel obstruction.  Because he had failed non-operative management, I recommended surgical exploration. We discussed possible laparoscopic approach with a high probability of needing to convert to open.  We discussed the risks and benefits of surgery including, but not limited to, bleeding, infection, injury to surrounding structures, need to convert to an open procedure, possible small-bowel resection with anastomosis stricture,  anastomosis leak, incisional hernia, wound infection, blood clot formation, postoperative issues with respect to heart and lungs as well as ileus.  He elected to proceed with surgery.  DESCRIPTION OF PROCEDURE:  He was taken to the OR 6 at [facility], placed supine on the operating room table.  General endotracheal anesthesia was established.  A surgical time-out was performed.  A Foley catheter was placed.  His left arm was placed along the side with appropriate padding.  His abdomen was prepped and draped in usual standard surgical fashion with ChloraPrep.  He received cefotetan prior to skin incision.  A surgical time-out was performed. He had 2 vertical incisions on his abdomen as well as an old ileostomy site.  I like to gain entry into his left upper abdomen using Optiview technique.  A small incision was made underneath the left subcostal margin.  Then using a 0-degree laparoscope, advanced it through the abdominal wall through a 5-mm trocar and entered the abdominal cavity. Pneumoperitoneum was smoothly established up to a patient pressure of 15 mmHg.  The laparoscope was advanced and the abdominal cavity was surveilled.  There were some adhesions around where I entered, but I was able to get around it and identified some midline adhesions of small bowel.  Because there is web of omental adhesions where I had inserted the camera in the left upper quadrant, placed a trocar in the right upper abdomen  under direct visualization so I could view the adhesions in the left upper abdomen.  These appeared to be mainly omental.  When I started taking those down, so I placed another 5-mm trocar on the right side of the abdomen under direct visualization starting down the omental adhesions in the left upper quadrant.  I encountered some brisk bleeding from an omental blood vessel that did not respond to electrocautery, so I placed several clips on it.  I also took down some omental  adhesions in the midline which also had brisk bleeding from omental vessel which also required 2 clips as well.  We ended up placing 2 additional 5-mm trocars on the left abdomen laterally.  I then put the camera back in the left upper quadrant and returned to the patient's left side and started to now look at the bowel and now that I had freed the omental adhesions.  There was some adhesive band of small bowel to the midline in the upper abdomen.  This was taken down sharply with EndoShears taking care to stay away from bowel itself.  I identified the cecum and the terminal ileum and the appendix which was grossly normal.  I then started running the bowel back proximally using atraumatic bowel graspers.  In the mid ileum, I came across his old anastomosis which appeared to be a side-by-side small-bowel anastomosis which was severely dilated which appeared to be chronic.  I then continued running proximally upstream from this, the bowel was not terribly dilated, and I continued to run the bowel back proximally.  There were some interloop adhesions which were taken down with EndoShears, and I continued to run the bowel back proximally until we got to an area that was dilated, and this was in the section of the adhesive band that I had taken down, but again it was not that impressive.  I ran all the way back to the ligament of Treitz.  I then ran the bowel back starting proximally at the ligament of Treitz down to the cecum.  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  This almost appeared that it could easily become a volvulus and that could easily be twist on its own mesentery because it was so dilated.  This corresponded to the area abnormality on his admission CT scan, and I decided to resect this portion.  A small mini-midline incision was made through an old incision excising the old scar.  An Alexis wound protector was placed, and the small-bowel  anastomosis was brought out through the abdominal wall.  A small rent was made in the mesentery just upstream to the anastomosis, and it was divided with a GIA 75 stapler with a blue load and then just downstream to the old connection, I made another window in the mesentery just next to the small bowel wall and transected.  We then took down the mesentery of the old small-bowel anastomosis in sequential fashion using the LigaSure, and this freed the old small bowel and it was passed off the field.  I then brought the proximal small bowel and the distal small bowel together side-by-side fashion.  Created enterotomy.  I then placed 1 fork of each limb of a GIA 75 stapler through each enterotomy, brought the stapler together and fired to create a common channel.  I did not like how the connection looked, it just did not appear normal.  It appeared that one side of the anastomosis had been rolled too much  towards the mesenteric aspect, so therefore I decided to take down that staple line.  I made a defect just below the end of the staple line in the mesentery on each side and divided each limb of the small bowel with a GIA 75 stapler with a blue load and then took down the anastomosis with a LigaSure device.  This freed the staple line in that partial anastomosis, and it was passed off the field.  I then ensured that the bowel was properly oriented, retention stay sutures were placed.  Enterotomy was placed in each limb of the small bowel, both proximally and distally.  One fork of the GIA 75 stapler was placed through each enterotomy and the staple was brought together and side-to-side anastomosis was done using another fire of the GIA 75 with a blue load stapler.  I then closed the common enterotomy with a TA 60 stapler.  I then closed the mesenteric defect with interrupted 2-0 silk sutures.  The staple line was then imbricated with interrupted 3-0 silk sutures.  The 3-0 silk sutures placed  in the crotch of the anastomosis.  There was no twisting and had excellent blood supply.  The mesenteric defect was closed.  This was about 15 cm upstream from the TI.  The bowel was returned to the abdomen.  We then removed the Alexis wound protector.  Changed gowns and gloves and placed new drapes, suction tubing, and Bovie electrocautery.  I then closed the midline fascia with interrupted 0 Novafil sutures.  We then re- established pneumoperitoneum and inspected the fascial closure.  There was nothing trapped within our closure.  The areas that had bled previously and the omental adhesion area was reinspected, and there were no signs of bleeding.  I released the pneumoperitoneum.  The trocar sites were closed with a 4-0 Monocryl in subcuticular fashion.  The midline incision was irrigated, and the skin was reapproximated with skin staples followed by application of a honeycomb dressing.  I placed local preperitoneal while we were laparoscopic.  Benzoin, Steri-Strips, and bandages were placed on the trocar sites.  The patient was extubated and taken to recovery room in stable condition.  There were no immediate complications.  The patient tolerated the procedure well.  All needle, instrument, sponge counts were correct x2.     Mary Sella. Andrey Campanile, MD, FACS     EMW/MEDQ  D:  03/22/2015  T:  03/22/2015  Job:  [id number]

## 2015-03-22 NOTE — Anesthesia Postprocedure Evaluation (Signed)
Anesthesia Post Note  Patient: James HaileyCharles D Barber  Procedure(s) Performed: Procedure(s) (LRB): DIAGONOSTIC LAPAROSCOPY, LAPROSCOPIC LYSIS OF ADHESIONS FOR ONE HOUR, DISTAL SMALL BOWEL RESECTION (N/A)  Anesthesia type: general  Patient location: PACU  Post pain: Pain level controlled  Post assessment: Patient's Cardiovascular Status Stable  Last Vitals:  Filed Vitals:   03/22/15 1434  BP:   Pulse:   Temp:   Resp: 16    Post vital signs: Reviewed and stable  Level of consciousness: sedated  Complications: No apparent anesthesia complications

## 2015-03-22 NOTE — Anesthesia Procedure Notes (Addendum)
Procedure Name: Intubation Date/Time: 03/22/2015 9:55 AM Performed by: Orest DikesPETERS, Korry Dalgleish J Pre-anesthesia Checklist: Patient identified, Emergency Drugs available, Suction available and Patient being monitored Patient Re-evaluated:Patient Re-evaluated prior to inductionOxygen Delivery Method: Circle System Utilized Preoxygenation: Pre-oxygenation with 100% oxygen Intubation Type: IV induction, Rapid sequence and Cricoid Pressure applied Laryngoscope Size: Mac and 4 Grade View: Grade II Tube type: Oral Tube size: 7.5 mm Number of attempts: 1 Airway Equipment and Method: Stylet and Oral airway Placement Confirmation: ETT inserted through vocal cords under direct vision,  positive ETCO2 and breath sounds checked- equal and bilateral Secured at: 21 cm Tube secured with: Tape Dental Injury: Teeth and Oropharynx as per pre-operative assessment  Comments: Existing NGT to suction prior to, during, and after induction of anesthesia.

## 2015-03-23 ENCOUNTER — Encounter (HOSPITAL_COMMUNITY): Payer: Self-pay | Admitting: General Surgery

## 2015-03-23 LAB — BASIC METABOLIC PANEL
ANION GAP: 6 (ref 5–15)
BUN: 11 mg/dL (ref 6–23)
CHLORIDE: 105 mmol/L (ref 96–112)
CO2: 25 mmol/L (ref 19–32)
Calcium: 8.5 mg/dL (ref 8.4–10.5)
Creatinine, Ser: 1.23 mg/dL (ref 0.50–1.35)
GFR calc Af Amer: 69 mL/min — ABNORMAL LOW (ref >90)
GFR calc non Af Amer: 59 mL/min — ABNORMAL LOW (ref >90)
Glucose, Bld: 167 mg/dL — ABNORMAL HIGH (ref 70–99)
Potassium: 4.1 mmol/L (ref 3.5–5.1)
Sodium: 136 mmol/L (ref 135–145)

## 2015-03-23 LAB — CBC
HEMATOCRIT: 36.1 % — AB (ref 39.0–52.0)
Hemoglobin: 11.9 g/dL — ABNORMAL LOW (ref 13.0–17.0)
MCH: 31.3 pg (ref 26.0–34.0)
MCHC: 33 g/dL (ref 30.0–36.0)
MCV: 95 fL (ref 78.0–100.0)
Platelets: 322 10*3/uL (ref 150–400)
RBC: 3.8 MIL/uL — ABNORMAL LOW (ref 4.22–5.81)
RDW: 13.7 % (ref 11.5–15.5)
WBC: 8.7 10*3/uL (ref 4.0–10.5)

## 2015-03-23 LAB — GLUCOSE, CAPILLARY
GLUCOSE-CAPILLARY: 125 mg/dL — AB (ref 70–99)
GLUCOSE-CAPILLARY: 161 mg/dL — AB (ref 70–99)
Glucose-Capillary: 133 mg/dL — ABNORMAL HIGH (ref 70–99)

## 2015-03-23 MED ORDER — INSULIN ASPART 100 UNIT/ML ~~LOC~~ SOLN
0.0000 [IU] | Freq: Three times a day (TID) | SUBCUTANEOUS | Status: DC
  Administered 2015-03-23 – 2015-03-25 (×6): 1 [IU] via SUBCUTANEOUS
  Administered 2015-03-25: 2 [IU] via SUBCUTANEOUS
  Administered 2015-03-25 – 2015-03-27 (×5): 1 [IU] via SUBCUTANEOUS
  Administered 2015-03-28: 2 [IU] via SUBCUTANEOUS
  Administered 2015-03-28: 1 [IU] via SUBCUTANEOUS

## 2015-03-23 MED ORDER — ACETAMINOPHEN 10 MG/ML IV SOLN
1000.0000 mg | Freq: Four times a day (QID) | INTRAVENOUS | Status: AC | PRN
Start: 2015-03-23 — End: 2015-03-24
  Administered 2015-03-23 (×2): 1000 mg via INTRAVENOUS
  Filled 2015-03-23 (×4): qty 100

## 2015-03-23 MED ORDER — HYDRALAZINE HCL 20 MG/ML IJ SOLN
5.0000 mg | Freq: Four times a day (QID) | INTRAMUSCULAR | Status: DC | PRN
  Administered 2015-03-24: 5 mg via INTRAVENOUS
  Filled 2015-03-23: qty 1

## 2015-03-23 MED ORDER — METHOCARBAMOL 1000 MG/10ML IJ SOLN
1000.0000 mg | Freq: Three times a day (TID) | INTRAVENOUS | Status: AC
  Administered 2015-03-23 – 2015-03-24 (×6): 1000 mg via INTRAVENOUS
  Filled 2015-03-23 (×6): qty 10

## 2015-03-23 NOTE — Progress Notes (Signed)
Patient ID: James Barber, male   DOB: 02-17-1946, 69 y.o.   MRN: [id number] 1 Day Post-Op  Subjective: Pt having pain today.  IV tylenol has worked well.  PCA isn't doing a whole lot for him, but he states, "it's fine."  No flatus.  Objective: Vital signs in last 24 hours: Temp:  [97.4 F (36.3 C)-99.4 F (37.4 C)] 98.5 F (36.9 C) (04/19 0511) Pulse Rate:  [79-91] 79 (04/19 0511) Resp:  [15-21] 17 (04/19 0519) BP: (146-170)/(73-87) 150/73 mmHg (04/19 0511) SpO2:  [96 %-99 %] 98 % (04/19 0519) Last BM Date: 03/20/15  Intake/Output from previous day: 04/18 0701 - 04/19 0700 In: 7320.8 [I.V.:6560.8; IV Piggyback:100] Out: 2700 [Urine:2570; Emesis/NG output:30; Blood:100] Intake/Output this shift:    PE: Abd: soft, tender as expected, hypoactive BS, NGT with bilious output, incisions are all c/d/i with staples Heart: regular Lungs: CTAB  Lab Results:   Recent Labs  03/22/15 0517 03/23/15 0530  WBC 6.9 8.7  HGB 12.4* 11.9*  HCT 36.9* 36.1*  PLT 348 322   BMET  Recent Labs  03/22/15 0517 03/23/15 0530  NA 134* 136  K 3.9 4.1  CL 104 105  CO2 25 25  GLUCOSE 163* 167*  BUN 9 11  CREATININE 1.22 1.23  CALCIUM 8.6 8.5   PT/INR No results for input(s): LABPROT, INR in the last 72 hours. CMP     Component Value Date/Time   NA 136 03/23/2015 0530   K 4.1 03/23/2015 0530   CL 105 03/23/2015 0530   CO2 25 03/23/2015 0530   GLUCOSE 167* 03/23/2015 0530   BUN 11 03/23/2015 0530   CREATININE 1.23 03/23/2015 0530   CALCIUM 8.5 03/23/2015 0530   PROT 7.5 03/21/2015 0155   ALBUMIN 3.7 03/21/2015 0155   AST 79* 03/21/2015 0155   ALT 97* 03/21/2015 0155   ALKPHOS 154* 03/21/2015 0155   BILITOT 1.1 03/21/2015 0155   GFRNONAA 59* 03/23/2015 0530   GFRAA 69* 03/23/2015 0530   Lipase     Component Value Date/Time   LIPASE 51 03/21/2015 0155       Studies/Results: Dg Abd 2 Views  03/22/2015   CLINICAL DATA:  Evaluate small bowel obstruction  EXAM:  ABDOMEN - 2 VIEW  COMPARISON:  03/21/2015; 03/18/2015; 03/17/2015; 08/04/2012; CT abdomen pelvis - 03/15/2015  FINDINGS: Post enteric tube placement with tip and side port projected over the gastric fundus.  Interval decrease in previously noted marked gas distention of multiple loops of small bowel with mildly dilated slightly amorphous appearing loop of small bowel within the left hand mid hemi abdomen now measuring approximately 4.1 cm in diameter, previously, 4.7 cm. Gas is seen within the splenic flexure and descending colon. No pneumoperitoneum, pneumatosis or portal venous gas.  Enteric suture line seen within the left mid hemi abdomen.  Limited visualization of lower thorax is normal.  Unchanged peripherally corticated ossicle adjacent to the left greater trochanter. Regional osseous structures appear otherwise normal.  IMPRESSION: Post enteric tube placement with findings suggestive of improved / resolved small-bowel obstruction.   Electronically Signed   By: Simonne ComeJohn  Watts M.D.   On: 03/22/2015 08:04    Anti-infectives: Anti-infectives    Start     Dose/Rate Route Frequency Ordered Stop   03/22/15 2200  cefoTEtan (CEFOTAN) 2 g in dextrose 5 % 50 mL IVPB     2 g 100 mL/hr over 30 Minutes Intravenous  Once 03/22/15 1417 03/22/15 2218   03/22/15 0600  cefoTEtan (CEFOTAN) 2  g in dextrose 5 % 50 mL IVPB    Comments:  Pharmacy may adjust dose strength for optimal dosing.   Send with patient on call to the OR.  Anesthesia to complete antibiotic administration <13min prior to incision per [facility].   2 g 100 mL/hr over 30 Minutes Intravenous On call to O.R. 03/21/15 0805 03/22/15 1000       Assessment/Plan  POD 1, s/p laparoscopy with LOA and lap assisted SBR of old anastomosis -cont NGT and await bowel function -DC foley today -mobilize and pulm toilet  - abx therapy complete -tylenol IV for pain control, PCA, and will add IV robaxin to see if this will help as well Diabetes  Mellitus -place on SSI, CBGs in 160s right now HTN -prn hydralazine, will resume home med when able to take POs Hypothyroidism -cont IV synthroid    LOS: 2 days    Raynee Mccasland E 03/23/2015, 8:53 AM Pager: [phone]

## 2015-03-24 LAB — BASIC METABOLIC PANEL
Anion gap: 6 (ref 5–15)
BUN: 11 mg/dL (ref 6–23)
CO2: 26 mmol/L (ref 19–32)
CREATININE: 1.26 mg/dL (ref 0.50–1.35)
Calcium: 8.5 mg/dL (ref 8.4–10.5)
Chloride: 104 mmol/L (ref 96–112)
GFR calc Af Amer: 67 mL/min — ABNORMAL LOW (ref >90)
GFR calc non Af Amer: 58 mL/min — ABNORMAL LOW (ref >90)
Glucose, Bld: 140 mg/dL — ABNORMAL HIGH (ref 70–99)
Potassium: 4.1 mmol/L (ref 3.5–5.1)
Sodium: 136 mmol/L (ref 135–145)

## 2015-03-24 LAB — CBC
HEMATOCRIT: 34.9 % — AB (ref 39.0–52.0)
HEMOGLOBIN: 11.3 g/dL — AB (ref 13.0–17.0)
MCH: 31.6 pg (ref 26.0–34.0)
MCHC: 32.4 g/dL (ref 30.0–36.0)
MCV: 97.5 fL (ref 78.0–100.0)
Platelets: 271 10*3/uL (ref 150–400)
RBC: 3.58 MIL/uL — AB (ref 4.22–5.81)
RDW: 14 % (ref 11.5–15.5)
WBC: 8.5 10*3/uL (ref 4.0–10.5)

## 2015-03-24 LAB — GLUCOSE, CAPILLARY
GLUCOSE-CAPILLARY: 128 mg/dL — AB (ref 70–99)
GLUCOSE-CAPILLARY: 130 mg/dL — AB (ref 70–99)
GLUCOSE-CAPILLARY: 158 mg/dL — AB (ref 70–99)
Glucose-Capillary: 147 mg/dL — ABNORMAL HIGH (ref 70–99)

## 2015-03-24 NOTE — Progress Notes (Signed)
2 Days Post-Op  Subjective: No flatus, Most of NG output is from ice, he is still sore and tender.  Dressing is mostly dry. He is using IS but not as frequently as he could.   Objective: Vital signs in last 24 hours: Temp:  [98 F (36.7 C)-103.2 F (39.6 C)] 99.1 F (37.3 C) (04/20 0542) Pulse Rate:  [81-118] 81 (04/20 0542) Resp:  [13-20] 18 (04/20 0745) BP: (148-162)/(79-84) 148/79 mmHg (04/20 0542) SpO2:  [91 %-99 %] 97 % (04/20 0745) Last BM Date: 03/20/15 550 from NG/360 in  TM 103.2 - 1900 hours yesterday, Tm 99.1 since then. BP OK HR ok since fever Glucose up some , WBC 8.5 this AM Intake/Output from previous day: 04/19 0701 - 04/20 0700 In: 3870.8 [I.V.:3110.8; NG/GT:360; IV Piggyback:280] Out: 1201 [Urine:651; Emesis/NG output:550] Intake/Output this shift:    General appearance: alert, cooperative and no distress Resp: clear to auscultation bilaterally GI: sore, no BS, wound OK, no flatus  Lab Results:   Recent Labs  03/23/15 0530 03/24/15 0538  WBC 8.7 8.5  HGB 11.9* 11.3*  HCT 36.1* 34.9*  PLT 322 271    BMET  Recent Labs  03/23/15 0530 03/24/15 0538  NA 136 136  K 4.1 4.1  CL 105 104  CO2 25 26  GLUCOSE 167* 140*  BUN 11 11  CREATININE 1.23 1.26  CALCIUM 8.5 8.5   PT/INR No results for input(s): LABPROT, INR in the last 72 hours.   Recent Labs Lab 03/21/15 0155  AST 79*  ALT 97*  ALKPHOS 154*  BILITOT 1.1  PROT 7.5  ALBUMIN 3.7     Lipase     Component Value Date/Time   LIPASE 51 03/21/2015 0155     Studies/Results: No results found.  Medications: . enoxaparin (LOVENOX) injection  40 mg Subcutaneous Q24H  . insulin aspart  0-9 Units Subcutaneous TID WC  . levothyroxine  25 mcg Intravenous Daily  . lip balm  1 application Topical BID  . methocarbamol (ROBAXIN)  IV  1,000 mg Intravenous 3 times per day  . morphine   Intravenous 6 times per day  . pantoprazole (PROTONIX) IV  40 mg Intravenous QHS     Assessment/Plan SBO with prior hx of SBO/PSBO,lysis of adhesions  Diagnostic laparoscopy, Laparoscopic lysis of adhesions for 1 hour, Distal small-bowel resection. 03/22/2015, Gaynelle AduEric Wilson, MD. AODM Hypertension hypothyroid GERD No antibiotics DVT: SCD/Lovenox   Plan:  Continue to mobilize, IV Tylenol started yesterday, keep NPO except ice.  Recheck labs in AM.    LOS: 3 days    Desira Alessandrini 03/24/2015

## 2015-03-25 LAB — CBC
HCT: 35.8 % — ABNORMAL LOW (ref 39.0–52.0)
Hemoglobin: 11.9 g/dL — ABNORMAL LOW (ref 13.0–17.0)
MCH: 31.6 pg (ref 26.0–34.0)
MCHC: 33.2 g/dL (ref 30.0–36.0)
MCV: 95.2 fL (ref 78.0–100.0)
PLATELETS: 286 10*3/uL (ref 150–400)
RBC: 3.76 MIL/uL — ABNORMAL LOW (ref 4.22–5.81)
RDW: 13.6 % (ref 11.5–15.5)
WBC: 7.8 10*3/uL (ref 4.0–10.5)

## 2015-03-25 LAB — GLUCOSE, CAPILLARY
GLUCOSE-CAPILLARY: 130 mg/dL — AB (ref 70–99)
Glucose-Capillary: 122 mg/dL — ABNORMAL HIGH (ref 70–99)
Glucose-Capillary: 155 mg/dL — ABNORMAL HIGH (ref 70–99)
Glucose-Capillary: 164 mg/dL — ABNORMAL HIGH (ref 70–99)

## 2015-03-25 LAB — BASIC METABOLIC PANEL
ANION GAP: 6 (ref 5–15)
BUN: 9 mg/dL (ref 6–23)
CO2: 25 mmol/L (ref 19–32)
CREATININE: 1.04 mg/dL (ref 0.50–1.35)
Calcium: 8.4 mg/dL (ref 8.4–10.5)
Chloride: 103 mmol/L (ref 96–112)
GFR calc Af Amer: 84 mL/min — ABNORMAL LOW (ref >90)
GFR calc non Af Amer: 73 mL/min — ABNORMAL LOW (ref >90)
GLUCOSE: 158 mg/dL — AB (ref 70–99)
POTASSIUM: 4.2 mmol/L (ref 3.5–5.1)
SODIUM: 134 mmol/L — AB (ref 135–145)

## 2015-03-25 MED ORDER — LORAZEPAM 2 MG/ML IJ SOLN
1.0000 mg | Freq: Three times a day (TID) | INTRAMUSCULAR | Status: DC | PRN
  Administered 2015-03-25: 1 mg via INTRAVENOUS
  Administered 2015-03-27: 0.5 mg via INTRAVENOUS
  Filled 2015-03-25 (×2): qty 1

## 2015-03-25 NOTE — Progress Notes (Signed)
3 Days Post-Op  Subjective: No flatus. Nurse reports pt had incontinence and condom cath placed. IS 2000. Nurse also reports pt not ambulating that much  Objective: Vital signs in last 24 hours: Temp:  [97.6 F (36.4 C)-99.5 F (37.5 C)] 99.5 F (37.5 C) (04/21 0800) Pulse Rate:  [94-115] 94 (04/21 0800) Resp:  [16-20] 18 (04/21 0800) BP: (152-186)/(71-99) 162/82 mmHg (04/21 0800) SpO2:  [93 %-98 %] 98 % (04/21 0800) Last BM Date: 03/20/15  Intake/Output from previous day: 04/20 0701 - 04/21 0700 In: 1275.8 [I.V.:1095.8] Out: 250 [Emesis/NG output:250] Intake/Output this shift:    Asleep, easily awakens Cta b/l Reg Distended, soft, incision c/d/i; hypoBS; bilious NG output - unclear amount/24hrs No edema  Lab Results:   Recent Labs  03/24/15 0538 03/25/15 0535  WBC 8.5 7.8  HGB 11.3* 11.9*  HCT 34.9* 35.8*  PLT 271 286   BMET  Recent Labs  03/24/15 0538 03/25/15 0535  NA 136 134*  K 4.1 4.2  CL 104 103  CO2 26 25  GLUCOSE 140* 158*  BUN 11 9  CREATININE 1.26 1.04  CALCIUM 8.5 8.4   PT/INR No results for input(s): LABPROT, INR in the last 72 hours. ABG No results for input(s): PHART, HCO3 in the last 72 hours.  Invalid input(s): PCO2, PO2  Studies/Results: No results found.  Anti-infectives: Anti-infectives    Start     Dose/Rate Route Frequency Ordered Stop   03/22/15 2200  cefoTEtan (CEFOTAN) 2 g in dextrose 5 % 50 mL IVPB     2 g 100 mL/hr over 30 Minutes Intravenous  Once 03/22/15 1417 03/22/15 2218   03/22/15 0600  cefoTEtan (CEFOTAN) 2 g in dextrose 5 % 50 mL IVPB    Comments:  Pharmacy may adjust dose strength for optimal dosing.   Send with patient on call to the OR.  Anesthesia to complete antibiotic administration <4260min prior to incision [facility].   2 g 100 mL/hr over 30 Minutes Intravenous On call to O.R. 03/21/15 0805 03/22/15 1000      Assessment/Plan: s/p Procedure(s): DIAGONOSTIC LAPAROSCOPY, LAPROSCOPIC LYSIS OF  ADHESIONS FOR ONE HOUR, DISTAL SMALL BOWEL RESECTION (N/A)  Oob, is, ambulate Cont bowel rest, ng tube decompression given abd distension.  Urinary retention - bladder scan showed 600cc; replace foley catheter.  Cont VTE prophlyaxis HTN - will add prn IV BP  Mary SellaEric M. Andrey CampanileWilson, MD, FACS General, Bariatric, & Minimally Invasive Surgery [facility] [place]   LOS: 4 days    Atilano InaWILSON,Iverson Sees M 03/25/2015

## 2015-03-26 LAB — GLUCOSE, CAPILLARY
GLUCOSE-CAPILLARY: 131 mg/dL — AB (ref 70–99)
Glucose-Capillary: 149 mg/dL — ABNORMAL HIGH (ref 70–99)
Glucose-Capillary: 125 mg/dL — ABNORMAL HIGH (ref 70–99)
Glucose-Capillary: 113 mg/dL — ABNORMAL HIGH (ref 70–99)

## 2015-03-26 LAB — CBC
HCT: 33.4 % — ABNORMAL LOW (ref 39.0–52.0)
Hemoglobin: 11.2 g/dL — ABNORMAL LOW (ref 13.0–17.0)
MCH: 31.7 pg (ref 26.0–34.0)
MCHC: 33.5 g/dL (ref 30.0–36.0)
MCV: 94.6 fL (ref 78.0–100.0)
PLATELETS: 274 10*3/uL (ref 150–400)
RBC: 3.53 MIL/uL — AB (ref 4.22–5.81)
RDW: 13.8 % (ref 11.5–15.5)
WBC: 6.1 10*3/uL (ref 4.0–10.5)

## 2015-03-26 LAB — BASIC METABOLIC PANEL
ANION GAP: 5 (ref 5–15)
BUN: 9 mg/dL (ref 6–23)
CALCIUM: 8.1 mg/dL — AB (ref 8.4–10.5)
CHLORIDE: 101 mmol/L (ref 96–112)
CO2: 24 mmol/L (ref 19–32)
CREATININE: 1.08 mg/dL (ref 0.50–1.35)
GFR calc non Af Amer: 70 mL/min — ABNORMAL LOW (ref >90)
GFR, EST AFRICAN AMERICAN: 81 mL/min — AB (ref >90)
Glucose, Bld: 169 mg/dL — ABNORMAL HIGH (ref 70–99)
Potassium: 4.1 mmol/L (ref 3.5–5.1)
Sodium: 130 mmol/L — ABNORMAL LOW (ref 135–145)

## 2015-03-26 LAB — MAGNESIUM: Magnesium: 2.1 mg/dL (ref 1.5–2.5)

## 2015-03-26 MED ORDER — PANTOPRAZOLE SODIUM 40 MG IV SOLR
40.0000 mg | Freq: Two times a day (BID) | INTRAVENOUS | Status: DC
  Administered 2015-03-26 – 2015-03-28 (×6): 40 mg via INTRAVENOUS
  Filled 2015-03-26 (×8): qty 40

## 2015-03-26 MED ORDER — TAMSULOSIN HCL 0.4 MG PO CAPS
0.4000 mg | ORAL_CAPSULE | Freq: Every day | ORAL | Status: DC
  Administered 2015-03-26 – 2015-04-02 (×8): 0.4 mg via ORAL
  Filled 2015-03-26 (×8): qty 1

## 2015-03-26 MED ORDER — KETOROLAC TROMETHAMINE 30 MG/ML IJ SOLN
30.0000 mg | Freq: Three times a day (TID) | INTRAMUSCULAR | Status: DC
  Administered 2015-03-26 – 2015-03-27 (×3): 30 mg via INTRAVENOUS
  Filled 2015-03-26 (×6): qty 1

## 2015-03-26 MED ORDER — POTASSIUM CHLORIDE IN NACL 20-0.9 MEQ/L-% IV SOLN
INTRAVENOUS | Status: DC
  Administered 2015-03-26 – 2015-03-28 (×5): via INTRAVENOUS
  Filled 2015-03-26 (×7): qty 1000

## 2015-03-26 NOTE — Progress Notes (Signed)
4 Days Post-Op  Subjective: He doesn't know if he's passing flatus or not.  OOB x 1 yesterday.  Using PCA.  Sleeping soundly when i came in.  NG drainage is showing a little blood.    Objective: Vital signs in last 24 hours: Temp:  [97.6 F (36.4 C)-100.8 F (38.2 C)] 100.8 F (38.2 C) (04/22 0514) Pulse Rate:  [93-107] 93 (04/22 0514) Resp:  [15-20] 18 (04/22 0759) BP: (138-173)/(70-89) 150/73 mmHg (04/22 0514) SpO2:  [93 %-99 %] 95 % (04/22 0759) Last BM Date: 03/20/15 240 PO 600 NG TM 100.8 0500 VSS NA is down, WBC is normal, glucose is up No film . dextrose 5 % and 0.9 % NaCl with KCl 40 mEq/L 125 mL/hr at 03/25/15 0100   Intake/Output from previous day: 04/21 0701 - 04/22 0700 In: 360 [P.O.:240; NG/GT:120] Out: 4100 [Urine:3500; Emesis/NG output:600] Intake/Output this shift:    General appearance: alert, cooperative and no distress Resp: clear to auscultation bilaterally GI: soft, few BS, incisions OK.  No flatus  Lab Results:   Recent Labs  03/25/15 0535 03/26/15 0532  WBC 7.8 6.1  HGB 11.9* 11.2*  HCT 35.8* 33.4*  PLT 286 274    BMET  Recent Labs  03/25/15 0535 03/26/15 0532  NA 134* 130*  K 4.2 4.1  CL 103 101  CO2 25 24  GLUCOSE 158* 169*  BUN 9 9  CREATININE 1.04 1.08  CALCIUM 8.4 8.1*   PT/INR No results for input(s): LABPROT, INR in the last 72 hours.   Recent Labs Lab 03/21/15 0155  AST 79*  ALT 97*  ALKPHOS 154*  BILITOT 1.1  PROT 7.5  ALBUMIN 3.7     Lipase     Component Value Date/Time   LIPASE 51 03/21/2015 0155     Studies/Results: No results found.  Medications: . enoxaparin (LOVENOX) injection  40 mg Subcutaneous Q24H  . insulin aspart  0-9 Units Subcutaneous TID WC  . levothyroxine  25 mcg Intravenous Daily  . lip balm  1 application Topical BID  . morphine   Intravenous 6 times per day  . pantoprazole (PROTONIX) IV  40 mg Intravenous QHS    Assessment/Plan SBO with prior hx of SBO/PSBO,lysis of  adhesions Diagnostic laparoscopy, Laparoscopic lysis of adhesions for 1 hour, Distal small-bowel resection. 03/22/2015, Gaynelle AduEric Wilson, MD. AODM Post op ileus. Hyponatremia  Hypertension Hypothyroid GERD No antibiotics DVT: SCD/Lovenox   Plan:  GET him OOB, walking, NG clamping trials, 19.5 mg morphine yesterday.  Add IV Toradol and see if we can cut down on the Morphine.  Take dextrose out of IV.    LOS: 5 days    Byard Carranza 03/26/2015

## 2015-03-27 LAB — BASIC METABOLIC PANEL
Anion gap: 6 (ref 5–15)
BUN: 15 mg/dL (ref 6–23)
CALCIUM: 8.3 mg/dL — AB (ref 8.4–10.5)
CO2: 24 mmol/L (ref 19–32)
Chloride: 105 mmol/L (ref 96–112)
Creatinine, Ser: 0.94 mg/dL (ref 0.50–1.35)
GFR calc Af Amer: >90 mL/min (ref >90)
GFR calc non Af Amer: 85 mL/min — ABNORMAL LOW (ref >90)
GLUCOSE: 108 mg/dL — AB (ref 70–99)
Potassium: 3.9 mmol/L (ref 3.5–5.1)
Sodium: 135 mmol/L (ref 135–145)

## 2015-03-27 LAB — GLUCOSE, CAPILLARY
GLUCOSE-CAPILLARY: 105 mg/dL — AB (ref 70–99)
Glucose-Capillary: 100 mg/dL — ABNORMAL HIGH (ref 70–99)
Glucose-Capillary: 105 mg/dL — ABNORMAL HIGH (ref 70–99)
Glucose-Capillary: 132 mg/dL — ABNORMAL HIGH (ref 70–99)

## 2015-03-27 LAB — CBC
HCT: 31.7 % — ABNORMAL LOW (ref 39.0–52.0)
Hemoglobin: 10.6 g/dL — ABNORMAL LOW (ref 13.0–17.0)
MCH: 31.5 pg (ref 26.0–34.0)
MCHC: 33.4 g/dL (ref 30.0–36.0)
MCV: 94.3 fL (ref 78.0–100.0)
Platelets: 296 10*3/uL (ref 150–400)
RBC: 3.36 MIL/uL — AB (ref 4.22–5.81)
RDW: 13.6 % (ref 11.5–15.5)
WBC: 4.9 10*3/uL (ref 4.0–10.5)

## 2015-03-27 MED ORDER — MORPHINE SULFATE 2 MG/ML IJ SOLN
1.0000 mg | INTRAMUSCULAR | Status: DC | PRN
  Administered 2015-03-27: 2 mg via INTRAVENOUS
  Administered 2015-03-27 – 2015-03-28 (×3): 1 mg via INTRAVENOUS
  Administered 2015-03-31 – 2015-04-02 (×2): 2 mg via INTRAVENOUS
  Filled 2015-03-27 (×6): qty 1

## 2015-03-27 MED ORDER — METHOCARBAMOL 1000 MG/10ML IJ SOLN
1000.0000 mg | Freq: Three times a day (TID) | INTRAVENOUS | Status: DC
  Administered 2015-03-27 – 2015-03-28 (×5): 1000 mg via INTRAVENOUS
  Filled 2015-03-27 (×6): qty 10

## 2015-03-27 NOTE — Progress Notes (Signed)
5 Days Post-Op  Subjective: Walked several times yesterday. IS up to 2000; lots of flatus starting yesterday pm  Objective: Vital signs in last 24 hours: Temp:  [98 F (36.7 C)-98.6 F (37 C)] 98 F (36.7 C) (04/23 0551) Pulse Rate:  [73-89] 73 (04/23 0551) Resp:  [14-18] 16 (04/23 0800) BP: (143-156)/(72-77) 143/73 mmHg (04/23 0551) SpO2:  [94 %-98 %] 94 % (04/23 0800) Last BM Date: 03/20/15  Intake/Output from previous day: 04/22 0701 - 04/23 0700 In: 120  Out: 1900 [Urine:1800; Emesis/NG output:100] Intake/Output this shift:    Alert, nad cta b/l Reg Soft, not really distended, incisionc/d/i; +BS No edema  Lab Results:   Recent Labs  03/26/15 0532 03/27/15 0534  WBC 6.1 4.9  HGB 11.2* 10.6*  HCT 33.4* 31.7*  PLT 274 296   BMET  Recent Labs  03/26/15 0532 03/27/15 0534  NA 130* 135  K 4.1 3.9  CL 101 105  CO2 24 24  GLUCOSE 169* 108*  BUN 9 15  CREATININE 1.08 0.94  CALCIUM 8.1* 8.3*   PT/INR No results for input(s): LABPROT, INR in the last 72 hours. ABG No results for input(s): PHART, HCO3 in the last 72 hours.  Invalid input(s): PCO2, PO2  Studies/Results: No results found.  Anti-infectives: Anti-infectives    Start     Dose/Rate Route Frequency Ordered Stop   03/22/15 2200  cefoTEtan (CEFOTAN) 2 g in dextrose 5 % 50 mL IVPB     2 g 100 mL/hr over 30 Minutes Intravenous  Once 03/22/15 1417 03/22/15 2218   03/22/15 0600  cefoTEtan (CEFOTAN) 2 g in dextrose 5 % 50 mL IVPB    Comments:  Pharmacy may adjust dose strength for optimal dosing.   Send with patient on call to the OR.  Anesthesia to complete antibiotic administration <2760min prior to incision [facility].   2 g 100 mL/hr over 30 Minutes Intravenous On call to O.R. 03/21/15 0805 03/22/15 1000      Assessment/Plan: s/p Procedure(s): DIAGONOSTIC LAPAROSCOPY, LAPROSCOPIC LYSIS OF ADHESIONS FOR ONE HOUR, DISTAL SMALL BOWEL RESECTION (N/A)  Dc ng tube Clears  Ambulate,  is D/c toradol, add back robaxin Urinary retention - cont foley today; voiding trial in am; cont flomax VTEprophylaxis - lovenox, scds  Mary SellaEric M. Andrey CampanileWilson, MD, FACS General, Bariatric, & Minimally Invasive Surgery [facility] [place]   LOS: 6 days    Atilano InaWILSON,Cynthie Garmon M 03/27/2015

## 2015-03-28 LAB — GLUCOSE, CAPILLARY
Glucose-Capillary: 137 mg/dL — ABNORMAL HIGH (ref 70–99)
Glucose-Capillary: 118 mg/dL — ABNORMAL HIGH (ref 70–99)
Glucose-Capillary: 161 mg/dL — ABNORMAL HIGH (ref 70–99)

## 2015-03-28 NOTE — Progress Notes (Signed)
General Surgery Note  LOS: 7 days  POD -  6 Days Post-Op  Assessment/Plan: 1.  DIAGONOSTIC LAPAROSCOPY, LAPROSCOPIC LYSIS OF ADHESIONS FOR ONE HOUR, DISTAL SMALL BOWEL RESECTION - 03/22/2015 - E. Wilson  A little bloated this AM - he still wants to try full liquids  2.  DVT prophylaxis - Lovenox 3.  On Synthroid   Principal Problem:   Recurrent SBO (small bowel obstruction) Active Problems:   Bilateral inguinal hernia (BIH), Left > Right   Incisional hernia, without obstruction or gangrene   Subjective:  Was doing well, but a little bloated this AM.  But he wants to try full liquids.  Wife in room. Objective:   Filed Vitals:   03/28/15 0538  BP: 171/82  Pulse: 90  Temp: 98.2 F (36.8 C)  Resp: 16     Intake/Output from previous day:  04/23 0701 - 04/24 0700 In: 910 [P.O.:910] Out: 2202 [Urine:2200; Stool:2]  Intake/Output this shift:  Total I/O In: -  Out: 350 [Urine:350]   Physical Exam:   General: WN WM who is alert and oriented.    HEENT: Normal. Pupils equal. .   Lungs: Clear.  IS at 2,000 cc   Abdomen: Mild distention.  BS present.   Wound: Clean.    Lab Results:    Recent Labs  03/26/15 0532 03/27/15 0534  WBC 6.1 4.9  HGB 11.2* 10.6*  HCT 33.4* 31.7*  PLT 274 296    BMET   Recent Labs  03/26/15 0532 03/27/15 0534  NA 130* 135  K 4.1 3.9  CL 101 105  CO2 24 24  GLUCOSE 169* 108*  BUN 9 15  CREATININE 1.08 0.94  CALCIUM 8.1* 8.3*    PT/INR  No results for input(s): LABPROT, INR in the last 72 hours.  ABG  No results for input(s): PHART, HCO3 in the last 72 hours.  Invalid input(s): PCO2, PO2   Studies/Results:  No results found.   Anti-infectives:   Anti-infectives    Start     Dose/Rate Route Frequency Ordered Stop   03/22/15 2200  cefoTEtan (CEFOTAN) 2 g in dextrose 5 % 50 mL IVPB     2 g 100 mL/hr over 30 Minutes Intravenous  Once 03/22/15 1417 03/22/15 2218   03/22/15 0600  cefoTEtan (CEFOTAN) 2 g in dextrose 5 % 50  mL IVPB    Comments:  Pharmacy may adjust dose strength for optimal dosing.   Send with patient on call to the OR.  Anesthesia to complete antibiotic administration <7560min prior to [facility].   2 g 100 mL/hr over 30 Minutes Intravenous On call to O.R. 03/21/15 0805 03/22/15 1000      Ovidio Kinavid Shanae Luo, MD, FACS Pager: [phone] [facility] Office: [phone] 03/28/2015

## 2015-03-29 LAB — COMPREHENSIVE METABOLIC PANEL
ALK PHOS: 230 U/L — AB (ref 39–117)
ALT: 150 U/L — ABNORMAL HIGH (ref 0–53)
AST: 89 U/L — ABNORMAL HIGH (ref 0–37)
Albumin: 3 g/dL — ABNORMAL LOW (ref 3.5–5.2)
Anion gap: 10 (ref 5–15)
BILIRUBIN TOTAL: 1.5 mg/dL — AB (ref 0.3–1.2)
BUN: 9 mg/dL (ref 6–23)
CO2: 23 mmol/L (ref 19–32)
Calcium: 8.7 mg/dL (ref 8.4–10.5)
Chloride: 104 mmol/L (ref 96–112)
Creatinine, Ser: 1.01 mg/dL (ref 0.50–1.35)
GFR calc non Af Amer: 75 mL/min — ABNORMAL LOW (ref >90)
GFR, EST AFRICAN AMERICAN: 87 mL/min — AB (ref >90)
GLUCOSE: 135 mg/dL — AB (ref 70–99)
Potassium: 3.8 mmol/L (ref 3.5–5.1)
Sodium: 137 mmol/L (ref 135–145)
Total Protein: 6.7 g/dL (ref 6.0–8.3)

## 2015-03-29 LAB — URINALYSIS, ROUTINE W REFLEX MICROSCOPIC
Bilirubin Urine: NEGATIVE
Glucose, UA: NEGATIVE mg/dL
Hgb urine dipstick: NEGATIVE
KETONES UR: 15 mg/dL — AB
Leukocytes, UA: NEGATIVE
Nitrite: NEGATIVE
PH: 5.5 (ref 5.0–8.0)
Protein, ur: NEGATIVE mg/dL
Specific Gravity, Urine: 1.02 (ref 1.005–1.030)
UROBILINOGEN UA: 1 mg/dL (ref 0.0–1.0)

## 2015-03-29 LAB — CBC
HCT: 32.8 % — ABNORMAL LOW (ref 39.0–52.0)
HEMOGLOBIN: 11.1 g/dL — AB (ref 13.0–17.0)
MCH: 31.6 pg (ref 26.0–34.0)
MCHC: 33.8 g/dL (ref 30.0–36.0)
MCV: 93.4 fL (ref 78.0–100.0)
PLATELETS: 425 10*3/uL — AB (ref 150–400)
RBC: 3.51 MIL/uL — AB (ref 4.22–5.81)
RDW: 13.5 % (ref 11.5–15.5)
WBC: 5.5 10*3/uL (ref 4.0–10.5)

## 2015-03-29 LAB — GLUCOSE, CAPILLARY
GLUCOSE-CAPILLARY: 165 mg/dL — AB (ref 70–99)
Glucose-Capillary: 131 mg/dL — ABNORMAL HIGH (ref 70–99)
Glucose-Capillary: 147 mg/dL — ABNORMAL HIGH (ref 70–99)

## 2015-03-29 MED ORDER — NABUMETONE 750 MG PO TABS
750.0000 mg | ORAL_TABLET | Freq: Two times a day (BID) | ORAL | Status: DC
  Administered 2015-03-29 – 2015-04-02 (×9): 750 mg via ORAL
  Filled 2015-03-29 (×10): qty 1

## 2015-03-29 MED ORDER — IBUPROFEN 200 MG PO TABS
600.0000 mg | ORAL_TABLET | Freq: Four times a day (QID) | ORAL | Status: DC | PRN
Start: 2015-03-29 — End: 2015-03-29

## 2015-03-29 MED ORDER — FERROUS SULFATE 325 (65 FE) MG PO TABS: ORAL_TABLET | ORAL | Status: DC

## 2015-03-29 MED ORDER — OXYCODONE-ACETAMINOPHEN 5-325 MG PO TABS: 1.0000 | ORAL_TABLET | ORAL | Status: DC | PRN

## 2015-03-29 MED ORDER — ONDANSETRON 4 MG PO TBDP
4.0000 mg | ORAL_TABLET | Freq: Three times a day (TID) | ORAL | Status: DC | PRN
  Filled 2015-03-29: qty 1

## 2015-03-29 MED ORDER — OMEPRAZOLE MAGNESIUM 20 MG PO TBEC: 20.0000 mg | DELAYED_RELEASE_TABLET | ORAL | Status: DC

## 2015-03-29 MED ORDER — LEVOTHYROXINE SODIUM 50 MCG PO TABS
50.0000 ug | ORAL_TABLET | Freq: Every day | ORAL | Status: DC
  Administered 2015-03-29 – 2015-04-02 (×5): 50 ug via ORAL
  Filled 2015-03-29 (×6): qty 1

## 2015-03-29 MED ORDER — ACETAMINOPHEN 325 MG PO TABS: 650.0000 mg | ORAL_TABLET | Freq: Four times a day (QID) | ORAL | Status: DC | PRN

## 2015-03-29 MED ORDER — GABAPENTIN 400 MG PO CAPS
1200.0000 mg | ORAL_CAPSULE | Freq: Three times a day (TID) | ORAL | Status: DC
  Administered 2015-03-29 – 2015-04-02 (×13): 1200 mg via ORAL
  Filled 2015-03-29 (×14): qty 3

## 2015-03-29 MED ORDER — PANTOPRAZOLE SODIUM 40 MG PO TBEC
40.0000 mg | DELAYED_RELEASE_TABLET | ORAL | Status: DC
  Administered 2015-03-29 – 2015-04-02 (×3): 40 mg via ORAL
  Filled 2015-03-29 (×4): qty 1

## 2015-03-29 MED ORDER — LISINOPRIL 20 MG PO TABS: ORAL_TABLET | ORAL | Status: AC

## 2015-03-29 MED ORDER — ASPIRIN EC 81 MG PO TBEC
81.0000 mg | DELAYED_RELEASE_TABLET | Freq: Every day | ORAL | Status: DC
  Administered 2015-03-29 – 2015-04-02 (×5): 81 mg via ORAL
  Filled 2015-03-29 (×5): qty 1

## 2015-03-29 MED ORDER — ROSUVASTATIN CALCIUM 10 MG PO TABS
10.0000 mg | ORAL_TABLET | Freq: Every day | ORAL | Status: DC
  Administered 2015-03-29: 10 mg via ORAL
  Filled 2015-03-29 (×2): qty 1

## 2015-03-29 MED ORDER — TAMSULOSIN HCL 0.4 MG PO CAPS: 0.4000 mg | ORAL_CAPSULE | Freq: Every day | ORAL | Status: DC

## 2015-03-29 MED ORDER — CYCLOBENZAPRINE HCL 10 MG PO TABS: 10.0000 mg | ORAL_TABLET | Freq: Every day | ORAL | Status: DC | PRN

## 2015-03-29 MED ORDER — LISINOPRIL 10 MG PO TABS
10.0000 mg | ORAL_TABLET | Freq: Every day | ORAL | Status: DC
  Administered 2015-03-29 – 2015-04-02 (×5): 10 mg via ORAL
  Filled 2015-03-29 (×5): qty 1

## 2015-03-29 NOTE — Progress Notes (Signed)
7 Days Post-Op  Subjective: Having BM's, incision Ok, tolerating full liquids.  Complaining of urinary urgency can barely get to BR in time, he had a foley placed for urinary retention so I will also check urine.  Objective: Vital signs in last 24 hours: Temp:  [98.3 F (36.8 C)-99 F (37.2 C)] 99 F (37.2 C) (04/25 0532) Pulse Rate:  [82-92] 82 (04/25 0532) Resp:  [18] 18 (04/25 0532) BP: (161-166)/(83-85) 161/85 mmHg (04/25 0532) SpO2:  [95 %-98 %] 95 % (04/25 0532) Last BM Date: 03/20/15 700 PO Full liquids Afebrile, VSS Labs OK 4/23 Intake/Output from previous day: 04/24 0701 - 04/25 0700 In: 700 [P.O.:700] Out: 350 [Urine:350] Intake/Output this shift:    General appearance: alert, cooperative and no distress Resp: clear to auscultation bilaterally GI: soft, non-tender; bowel sounds normal; no masses,  no organomegaly  Lab Results:   Recent Labs  03/27/15 0534  WBC 4.9  HGB 10.6*  HCT 31.7*  PLT 296    BMET  Recent Labs  03/27/15 0534  NA 135  K 3.9  CL 105  CO2 24  GLUCOSE 108*  BUN 15  CREATININE 0.94  CALCIUM 8.3*   PT/INR No results for input(s): LABPROT, INR in the last 72 hours.  No results for input(s): AST, ALT, ALKPHOS, BILITOT, PROT, ALBUMIN in the last 168 hours.   Lipase     Component Value Date/Time   LIPASE 51 03/21/2015 0155     Studies/Results: No results found.  Medications: . enoxaparin (LOVENOX) injection  40 mg Subcutaneous Q24H  . insulin aspart  0-9 Units Subcutaneous TID WC  . levothyroxine  25 mcg Intravenous Daily  . lip balm  1 application Topical BID  . methocarbamol (ROBAXIN)  IV  1,000 mg Intravenous 3 times per day  . pantoprazole (PROTONIX) IV  40 mg Intravenous Q12H  . tamsulosin  0.4 mg Oral Daily    Assessment/Plan SBO with prior hx of SBO/PSBO,lysis of adhesions Diagnostic laparoscopy, Laparoscopic lysis of adhesions for 1 hour, Distal small-bowel resection. 03/22/2015, Gaynelle AduEric Wilson, MD. AODM  ? Chronic neuropathy with multiple neck surgeries. Post op ileus. Hyponatremia  Hypertension Hypothyroid GERD No antibiotics DVT: SCD/Lovenox    Plan;  Soft diet, d/c IV, give him alternative to IV pain meds, recheck labs today, Hemoglobin A1C, he has had some high glucose, but no hx of diabetes, get him back on PO meds from home and if OK d/c in AM [employer] out later in the office this week, see Dr. Andrey CampanileWilson in 2 weeks. Restart lisinopril today also at lower dose     LOS: 8 days    Maijor Hornig 03/29/2015

## 2015-03-30 ENCOUNTER — Encounter: Payer: Self-pay | Admitting: Gastroenterology

## 2015-03-30 DIAGNOSIS — R7989 Other specified abnormal findings of blood chemistry: Secondary | ICD-10-CM

## 2015-03-30 DIAGNOSIS — E119 Type 2 diabetes mellitus without complications: Secondary | ICD-10-CM

## 2015-03-30 DIAGNOSIS — R945 Abnormal results of liver function studies: Secondary | ICD-10-CM

## 2015-03-30 LAB — PROTIME-INR
INR: 1.11 (ref 0.00–1.49)
Prothrombin Time: 14.4 seconds (ref 11.6–15.2)

## 2015-03-30 LAB — URINE CULTURE
Colony Count: NO GROWTH
Culture: NO GROWTH
SPECIAL REQUESTS: NORMAL

## 2015-03-30 LAB — COMPREHENSIVE METABOLIC PANEL
ALBUMIN: 3.1 g/dL — AB (ref 3.5–5.2)
ALT: 138 U/L — AB (ref 0–53)
AST: 72 U/L — AB (ref 0–37)
Alkaline Phosphatase: 221 U/L — ABNORMAL HIGH (ref 39–117)
Anion gap: 6 (ref 5–15)
BILIRUBIN TOTAL: 1.4 mg/dL — AB (ref 0.3–1.2)
BUN: 14 mg/dL (ref 6–23)
CO2: 24 mmol/L (ref 19–32)
CREATININE: 0.92 mg/dL (ref 0.50–1.35)
Calcium: 8.7 mg/dL (ref 8.4–10.5)
Chloride: 105 mmol/L (ref 96–112)
GFR calc Af Amer: >90 mL/min (ref >90)
GFR calc non Af Amer: 86 mL/min — ABNORMAL LOW (ref >90)
GLUCOSE: 136 mg/dL — AB (ref 70–99)
Potassium: 3.8 mmol/L (ref 3.5–5.1)
Sodium: 135 mmol/L (ref 135–145)
Total Protein: 6.8 g/dL (ref 6.0–8.3)

## 2015-03-30 LAB — CULTURE, BLOOD (ROUTINE X 2)
Culture: NO GROWTH
Culture: NO GROWTH

## 2015-03-30 LAB — GLUCOSE, CAPILLARY
Glucose-Capillary: 131 mg/dL — ABNORMAL HIGH (ref 70–99)
Glucose-Capillary: 146 mg/dL — ABNORMAL HIGH (ref 70–99)
Glucose-Capillary: 124 mg/dL — ABNORMAL HIGH (ref 70–99)
Glucose-Capillary: 150 mg/dL — ABNORMAL HIGH (ref 70–99)

## 2015-03-30 LAB — HEMOGLOBIN A1C
HEMOGLOBIN A1C: 7.4 % — AB (ref 4.8–5.6)
Mean Plasma Glucose: 166 mg/dL

## 2015-03-30 LAB — TSH: TSH: 3.046 u[IU]/mL (ref 0.350–4.500)

## 2015-03-30 LAB — GAMMA GT: GGT: 194 U/L — ABNORMAL HIGH (ref 7–51)

## 2015-03-30 MED ORDER — LIVING WELL WITH DIABETES BOOK
Freq: Once | Status: AC
  Administered 2015-03-30: 1
  Filled 2015-03-30: qty 1

## 2015-03-30 NOTE — Progress Notes (Signed)
Inpatient Diabetes Program Recommendations  AACE/ADA: New Consensus Statement on Inpatient Glycemic Control (2013)  Target Ranges:  Prepandial:   less than 140 mg/dL      Peak postprandial:   less than 180 mg/dL (1-2 hours)      Critically ill patients:  140 - 180 mg/dL   Reason for Visit: Diabetes Consult  Diabetes history: DM2 Outpatient Diabetes medications: Previously on metformin but could not tolerate Current orders for Inpatient glycemic control: None  Spoke with pt and wife regarding HgbA1C results of 7.4%. Discussed HgbA1C goal of < 7.0% to prevent long-term complications. Pt was previously on metformin. Did not tolerate. Wants to control blood sugars with diet and exercise. Not interested in OP Diabetes Education at this time. Will f/u with PCP. Encouraged pt to check blood sugars 1x/day at various times and take logbook to MD for review. Will need prescription for meter and supplies at discharge.    Results for James Barber, Fransisco D (MRN [id number]) as of 03/30/2015 14:51  Ref. Range 03/29/2015 08:21 03/29/2015 17:14 03/29/2015 21:03 03/30/2015 07:46 03/30/2015 11:48  Glucose-Capillary Latest Ref Range: 70-99 mg/dL 409131 (H) 811147 (H) 914165 (H) 131 (H) 146 (H)   Good glycemic control while inpatient. Good compliance expected.  Thank you. Ailene Ardshonda Treson Laura, RD, LDN, CDE Inpatient Diabetes Coordinator [phone]

## 2015-03-30 NOTE — Consult Note (Signed)
Pt seen and examined, xrays and lab were reviewed.  Full note to follow.  Pt has cholestatic LFTs of unclear etiology.  Prior CT did not demonstrate any biliary or liver abnormalities.  He's on no new meds that typically cause hepatitis.  Shock liver usually causes a sharp rise in transaminases.  Doubt acute hepatitis.  Recommend obtaining an abdominal ultrasound, check serologies for PBC, hepatitis.  OK to discharge.  I will followup as outpt. He is also due for screening colonoscopy .

## 2015-03-30 NOTE — Consult Note (Signed)
Referring Provider: CCS Primary Care Physician:  [name], MD Primary Gastroenterologist:  Dr. Russella Dar  Reason for Consultation:   Abnormal LFTs    HPI: James Barber is a 69 y.o. male with a history of hyperlipidemia, GERD, and hypothyroidism. His hemoglobin A1c was found to be 7.4 on this admission. 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 He had a small bowel obstruction with laparotomy for lysis of adhesions in or around 1990. He presented to the emergency department on April 11 with complaints of abdominal pain, nausea, and vomiting he had a CT scan and lab studies which revealed a small bowel obstruction distal to the previous anastomosis as well as an elevated white blood cell count. He had an NG tube placed and was started on IV hydration. After decompressed, his diet was slowly advanced and he was discharged home on the 16th. He presented to the emergency room on the 17th with recurrent abdominal pain and distention. He states on the evening of the 16th he began developing abdominal pain with distention with no nausea or vomiting. X-rays revealed gaseous distention of the small bowel. He was transferred from [facility] to [facility]. On April 18 he underwent diagnostic laparoscopy with laparoscopic adhesions and a distal small bowel resection. On April 17 he was noted to have abnormal LFTs with an alkaline phosphatase of 154, ALT 97, AST 79, and total bili 1.1. On April 25 his alkaline phosphatase was 2:30, ALT 150, AST 89, total bili 1.5. On the 26th alkaline phosphatase was 221, ALT 138, AST 72, total bili 1.4.  Patient states he feels fine and has no abdominal pain. He is tolerating a regular diet and has no nausea or vomiting. He denies excess use of Tylenol and denies use of alcohol. He has not been using any herbal supplements. His only new medication is Synthroid which he was started on on  April 1. He has had Flomax on this hospitalization for urinary retention as well. He has no history of blood transfusions. He denies past intravenous drug abuse or intranasal recreational drug use. He has no tattoos.   Past Medical History  Diagnosis Date  . Hypertension   . GERD (gastroesophageal reflux disease)     takes  otc every  other day  . Bowel obstruction   . Diabetes mellitus without complication     Past Surgical History  Procedure Laterality Date  . Abdominal surgery    . Back surgery    . Cervical fusion      c 3-4, C 4-5, C5-6  . Hernia repair      right inguinal  . Anterior cervical decomp/discectomy fusion  11/12/2012    Procedure: ANTERIOR CERVICAL DECOMPRESSION/DISCECTOMY FUSION 2 LEVELS;  Surgeon: Barnett Abu, MD;  Location: [facility];  Service: Neurosurgery;  Laterality: N/A;  Right Side approach Cervical six-seven,Cervical seven -thoracic one Anterior cervical decompression/diskectomy/fusion  . Laparotomy N/A 03/22/2015    Procedure: DIAGONOSTIC LAPAROSCOPY, LAPROSCOPIC LYSIS OF ADHESIONS FOR ONE HOUR, DISTAL SMALL BOWEL RESECTION;  Surgeon: [name], MD;  Location: [facility];  Service: General;  Laterality: N/A;    Prior to Admission medications   Medication Sig Start Date End Date Taking? Authorizing Provider  aspirin EC 81 MG tablet Take 81 mg by mouth daily.   Yes Historical Provider, MD  cyclobenzaprine (FLEXERIL) 10 MG tablet Take 10 mg by mouth daily as needed for muscle spasms.  Yes Historical Provider, MD  diphenhydrAMINE (BENADRYL) 25 MG tablet Take 25 mg by mouth at bedtime.   Yes Historical Provider, MD  gabapentin (NEURONTIN) 400 MG capsule Take 1,200 mg by mouth 3 (three) times daily.    Yes Historical Provider, MD  levothyroxine (SYNTHROID, LEVOTHROID) 50 MCG tablet Take 50 mcg by mouth daily before breakfast.   Yes Historical Provider, MD  Multiple Vitamin (MULTIVITAMIN WITH MINERALS) TABS tablet Take 1 tablet by mouth daily.   Yes Historical  Provider, MD  nabumetone (RELAFEN) 750 MG tablet Take 750 mg by mouth 2 (two) times daily.   Yes Historical Provider, MD  omeprazole (PRILOSEC OTC) 20 MG tablet Take 20 mg by mouth every other day.   Yes Historical Provider, MD  Polyethylene Glycol 400 0.25 % SOLN Apply 1 drop to eye 2 (two) times daily as needed (dry eyes).   Yes Historical Provider, MD  pseudoephedrine-acetaminophen (TYLENOL SINUS) 30-500 MG TABS Take 2 tablets by mouth every morning.   Yes Historical Provider, MD  rosuvastatin (CRESTOR) 10 MG tablet Take 10 mg by mouth daily.   Yes Historical Provider, MD  acetaminophen (TYLENOL) 325 MG tablet Take 2 tablets (650 mg total) by mouth every 6 (six) hours as needed for mild pain, moderate pain, fever or headache. 03/29/15   Sherrie George, PA-C  ferrous sulfate 325 (65 FE) MG tablet You can resume this after your post op visit as needed. 03/29/15   Sherrie George, PA-C  lisinopril (PRINIVIL,ZESTRIL) 20 MG tablet Resume one daily for systolic blood pressure 120 or greater.  If less call PCP and ask for recommendations. 03/29/15   Sherrie George, PA-C  oxyCODONE-acetaminophen (PERCOCET/ROXICET) 5-325 MG per tablet Take 1-2 tablets by mouth every 4 (four) hours as needed. 03/29/15   Sherrie George, PA-C  tamsulosin (FLOMAX) 0.4 MG CAPS capsule Take 1 capsule (0.4 mg total) by mouth daily. 03/29/15   Sherrie George, PA-C    Current Facility-Administered Medications  Medication Dose Route Frequency Provider Last Rate Last Dose  . acetaminophen (TYLENOL) tablet 650 mg  650 mg Oral Q6H PRN Sherrie George, PA-C      . aspirin EC tablet 81 mg  81 mg Oral Daily Sherrie George, PA-C   81 mg at 03/30/15 1030  . cyclobenzaprine (FLEXERIL) tablet 10 mg  10 mg Oral Daily PRN Sherrie George, PA-C      . enoxaparin (LOVENOX) injection 40 mg  40 mg Subcutaneous Q24H [name], MD   40 mg at 03/30/15 0900  . gabapentin (NEURONTIN) capsule 1,200 mg  1,200 mg Oral TID Sherrie George,  PA-C   1,200 mg at 03/30/15 1030  . hydrALAZINE (APRESOLINE) injection 5 mg  5 mg Intravenous Q6H PRN Barnetta Chapel, PA-C   5 mg at 03/24/15 2221  . levothyroxine (SYNTHROID, LEVOTHROID) tablet 50 mcg  50 mcg Oral QAC breakfast Sherrie George, PA-C   50 mcg at 03/30/15 0900  . lip balm (CARMEX) ointment 1 application  1 application Topical BID Karie Soda, MD   1 application at 03/30/15 1000  . lisinopril (PRINIVIL,ZESTRIL) tablet 10 mg  10 mg Oral Daily Sherrie George, PA-C   10 mg at 03/30/15 1030  . menthol-cetylpyridinium (CEPACOL) lozenge 3 mg  1 lozenge Oral PRN Avel Peace, MD      . morphine 2 MG/ML injection 1-3 mg  1-3 mg Intravenous Q1H PRN [name], MD   1 mg at 03/28/15 1853  . nabumetone (RELAFEN) tablet 750 mg  750 mg Oral BID Yehuda Mao  Marlyne Beards, PA-C   750 mg at 03/30/15 1030  . ondansetron (ZOFRAN-ODT) disintegrating tablet 4 mg  4 mg Oral Q8H PRN Sherrie George, PA-C      . oxyCODONE-acetaminophen (PERCOCET/ROXICET) 5-325 MG per tablet 1-2 tablet  1-2 tablet Oral Q4H PRN Sherrie George, PA-C      . pantoprazole (PROTONIX) EC tablet 40 mg  40 mg Oral Q48H Md Ccs, MD   40 mg at 03/29/15 1113  . phenol (CHLORASEPTIC) mouth spray 2 spray  2 spray Mouth/Throat PRN Karie Soda, MD      . tamsulosin [facility]) capsule 0.4 mg  0.4 mg Oral Daily [name], MD   0.4 mg at 03/30/15 1030    Allergies as of 03/21/2015  . (No Known Allergies)    History reviewed. No pertinent family history.  History   Social History  . Marital Status: Married    Spouse Name: N/A  . Number of Children: N/A  . Years of Education: N/A   Occupational History  . Not on file.   Social History Main Topics  . Smoking status: Never Smoker   . Smokeless tobacco: Not on file  . Alcohol Use: No  . Drug Use: No  . Sexual Activity: Yes    Birth Control/ Protection: None   Other Topics Concern  . Not on file   Social History Narrative    Review of Systems: Gen: Denies any fever,  chills, sweats, anorexia, fatigue, weakness, malaise, weight loss, and sleep disorder CV: Denies chest pain, angina, palpitations, syncope, orthopnea, PND, peripheral edema, and claudication. Resp: Denies dyspnea at rest, dyspnea with exercise, cough, sputum, wheezing, coughing up blood, and pleurisy. GI: Denies vomiting blood, jaundice, and fecal incontinence.   Denies dysphagia or odynophagia. GU :Recentrly had urinary retention post op that has improved. MS: Denies joint pain, limitation of movement, and swelling, stiffness, low back pain, extremity pain. Denies muscle weakness, cramps, atrophy.  Derm: Denies rash, itching, dry skin, hives, moles, warts, or unhealing ulcers.  Psych: Denies depression, anxiety, memory loss, suicidal ideation, hallucinations, paranoia, and confusion. Heme: Denies bruising, bleeding, and enlarged lymph nodes. Neuro:  Denies any headaches, dizziness, paresthesias. Endo:Recently started synthroid, type 2DM  Physical Exam: Vital signs in last 24 hours: Temp:  [98.3 F (36.8 C)-98.9 F (37.2 C)] 98.3 F (36.8 C) (04/26 0507) Pulse Rate:  [80-86] 80 (04/26 0507) Resp:  [20] 20 (04/26 0507) BP: (129-133)/(65-76) 129/65 mmHg (04/26 0507) SpO2:  [95 %-96 %] 96 % (04/26 0507) Last BM Date: 03/29/15 General:   Alert,  Well-developed, well-nourished, pleasant and cooperative in NAD Head:  Normocephalic and atraumatic. Eyes:  Sclera clear, no icterus. Conjunctiva pink. Ears:  Normal auditory acuity. Nose:  No deformity, discharge,  or lesions. Mouth:  No deformity or lesions.   Neck:  Supple; no masses or thyromegaly. Lungs:  Clear throughout to auscultation.   No wheezes, crackles, or rhonchi . Heart:  Regular rate and rhythm; no murmurs, clicks, rubs,  or gallops. Abdomen:  Soft,nontender, BS active,nonpalp mass or hsm.  Laparoscopic portals covered with bandaids, midline incision with staples in place. Rectal:  Deferred  Msk:  Symmetrical without gross  deformities. . Pulses:  Normal pulses noted. Extremities:  Without clubbing or edema. Neurologic:  Alert and  oriented x4;  grossly normal neurologically. Skin: Intact without significant lesions or rashes.. Psych: Alert and cooperative. Normal mood and affect.  Intake/Output from previous day: 04/25 0701 - 04/26 0700 In: 600 [P.O.:600] Out: -  Intake/Output this shift:  Lab Results:  Recent Labs  03/29/15 0911  WBC 5.5  HGB 11.1*  HCT 32.8*  PLT 425*   BMET  Recent Labs  03/29/15 0911 03/30/15 0847  NA 137 135  K 3.8 3.8  CL 104 105  CO2 23 24  GLUCOSE 135* 136*  BUN 9 14  CREATININE 1.01 0.92  CALCIUM 8.7 8.7   LFT  Recent Labs  03/30/15 0847  PROT 6.8  ALBUMIN 3.1*  AST 72*  ALT 138*  ALKPHOS 221*  BILITOT 1.4*   [facility] showed total bili 0.8, alkaline phosphatase 66, AST 22, ALT 25. Labs on April 17 showed total bili 1.1, alkaline phosphatase 154, AST 79, ALT 97. Labs on April 25 showed total bili 1.5, alkaline phosphatase 2:30, AST 89, ALT 150. Labs on April 27 showed total bili 1.4, alkaline phosphatase 221, AST 72, ALT 138.   IMPRESSION/PLAN: 69 year old male with history of hyperlipidemia, GERD, hypothyroidism, new diagnosis of type 2 diabetes, status post laparoscopy with lysis of adhesions and distal small bowel resection on April 18 now found to have abnormal LFTs. Patient has no abdominal pain, nausea, vomiting. He denies dark urine or light-colored stools. CT of the abdomen and pelvis on 03/14/2015 at no focal liver abnormalities and no evidence of biliary obstruction or stone. Elevation of LFTs seems to have occurred around the time of his obstruction and progressed postoperatively.? if patient may have a drug-induced liver injury, as cefotetan even in a limited dose may sometimes cause abnormal LFTs, Lovenox can raise ALT and AST, pantoprazole can elevate alkaline phosphatase and transaminases and Neurontin may lead  to elevated LFTs as well. Other etiology may include mild shock liver if input was diminished preadmission due to nausea and vomiting. Will check an abdominal ultrasound as well as a GGT, TSH, ceruloplasmin, all for 1 antitrypsin, a and a, anti-smooth muscle antibody, hepatitis panel, TTG and IgA. We'll repeat comprehensive metabolic panel. If ultrasound normal and LFTs improving will schedule follow-up as outpatient.   Velinda Wrobel, Moise BoringLori P PA-C 03/30/2015,  Pager [phone]

## 2015-03-30 NOTE — Progress Notes (Signed)
8 Days Post-Op  Subjective: Tolerating diet.  No significant abdominal pain.  No n/v.  Objective: Vital signs in last 24 hours: Temp:  [98.3 F (36.8 C)-98.9 F (37.2 C)] 98.3 F (36.8 C) (04/26 0507) Pulse Rate:  [80-86] 80 (04/26 0507) Resp:  [20] 20 (04/26 0507) BP: (129-133)/(65-76) 129/65 mmHg (04/26 0507) SpO2:  [95 %-96 %] 96 % (04/26 0507) Last BM Date: 03/29/15  Intake/Output from previous day: 04/25 0701 - 04/26 0700 In: 600 [P.O.:600] Out: -  Intake/Output this shift:    PE: General- In NAD Abdomen-soft, incisions are clean and intact  Lab Results:   Recent Labs  03/29/15 0911  WBC 5.5  HGB 11.1*  HCT 32.8*  PLT 425*   BMET  Recent Labs  03/29/15 0911  NA 137  K 3.8  CL 104  CO2 23  GLUCOSE 135*  BUN 9  CREATININE 1.01  CALCIUM 8.7   PT/INR No results for input(s): LABPROT, INR in the last 72 hours. Comprehensive Metabolic Panel:    Component Value Date/Time   NA 137 03/29/2015 0911   NA 135 03/27/2015 0534   K 3.8 03/29/2015 0911   K 3.9 03/27/2015 0534   CL 104 03/29/2015 0911   CL 105 03/27/2015 0534   CO2 23 03/29/2015 0911   CO2 24 03/27/2015 0534   BUN 9 03/29/2015 0911   BUN 15 03/27/2015 0534   CREATININE 1.01 03/29/2015 0911   CREATININE 0.94 03/27/2015 0534   GLUCOSE 135* 03/29/2015 0911   GLUCOSE 108* 03/27/2015 0534   CALCIUM 8.7 03/29/2015 0911   CALCIUM 8.3* 03/27/2015 0534   AST 89* 03/29/2015 0911   AST 79* 03/21/2015 0155   ALT 150* 03/29/2015 0911   ALT 97* 03/21/2015 0155   ALKPHOS 230* 03/29/2015 0911   ALKPHOS 154* 03/21/2015 0155   BILITOT 1.5* 03/29/2015 0911   BILITOT 1.1 03/21/2015 0155   PROT 6.7 03/29/2015 0911   PROT 7.5 03/21/2015 0155   ALBUMIN 3.0* 03/29/2015 0911   ALBUMIN 3.7 03/21/2015 0155     Studies/Results: No results found.  Anti-infectives: Anti-infectives    Start     Dose/Rate Route Frequency Ordered Stop   03/22/15 2200  cefoTEtan (CEFOTAN) 2 g in dextrose 5 % 50 mL IVPB      2 g 100 mL/hr over 30 Minutes Intravenous  Once 03/22/15 1417 03/22/15 2218   03/22/15 0600  cefoTEtan (CEFOTAN) 2 g in dextrose 5 % 50 mL IVPB    Comments:  Pharmacy may adjust dose strength for optimal dosing.   Send with patient on call to the OR.  Anesthesia to complete antibiotic administration <8060min prior to [facility].   2 g 100 mL/hr over 30 Minutes Intravenous On call to O.R. 03/21/15 0805 03/22/15 1000      Assessment Principal Problem:   Recurrent SBO (small bowel obstruction) Active Problems:     Diabetes mellitus, type II-hemoglobin A1c elevated at 7.4; had tried Metformin and not tolerated it; glucose is < 150 on carb modified diet   Elevated LFTs-normal on 4/10, slightly elevated on 4/17, more elevated yesterday-etiology unclear; has been off Crestor since 3/30.    LOS: 9 days   Plan: Repeat LFTs and if they are continuing to go up will ask for GI input, if decreasing will discharge and have him follow up with his PCP.  Remove staples. Consult diabetic coordinator for teaching.   James Barber Shela CommonsJ 03/30/2015

## 2015-03-31 ENCOUNTER — Encounter (HOSPITAL_COMMUNITY): Payer: Self-pay | Admitting: Radiology

## 2015-03-31 ENCOUNTER — Inpatient Hospital Stay (HOSPITAL_COMMUNITY): Payer: Medicare Other

## 2015-03-31 DIAGNOSIS — I2699 Other pulmonary embolism without acute cor pulmonale: Secondary | ICD-10-CM

## 2015-03-31 DIAGNOSIS — R7989 Other specified abnormal findings of blood chemistry: Secondary | ICD-10-CM

## 2015-03-31 DIAGNOSIS — K5669 Other intestinal obstruction: Secondary | ICD-10-CM

## 2015-03-31 LAB — CBC
HCT: 35.3 % — ABNORMAL LOW (ref 39.0–52.0)
Hemoglobin: 11.6 g/dL — ABNORMAL LOW (ref 13.0–17.0)
MCH: 31.1 pg (ref 26.0–34.0)
MCHC: 32.9 g/dL (ref 30.0–36.0)
MCV: 94.6 fL (ref 78.0–100.0)
PLATELETS: 493 10*3/uL — AB (ref 150–400)
RBC: 3.73 MIL/uL — ABNORMAL LOW (ref 4.22–5.81)
RDW: 13.8 % (ref 11.5–15.5)
WBC: 8.6 10*3/uL (ref 4.0–10.5)

## 2015-03-31 LAB — GLUCOSE, CAPILLARY
GLUCOSE-CAPILLARY: 142 mg/dL — AB (ref 70–99)
GLUCOSE-CAPILLARY: 181 mg/dL — AB (ref 70–99)
GLUCOSE-CAPILLARY: 135 mg/dL — AB (ref 70–99)
Glucose-Capillary: 126 mg/dL — ABNORMAL HIGH (ref 70–99)

## 2015-03-31 LAB — TROPONIN I
TROPONIN I: 0.07 ng/mL — AB (ref <0.031)
TROPONIN I: 0.14 ng/mL — AB (ref <0.031)
Troponin I: 0.12 ng/mL — ABNORMAL HIGH (ref <0.031)

## 2015-03-31 LAB — CK TOTAL AND CKMB (NOT AT ARMC)
CK, MB: 2.1 ng/mL (ref 0.3–4.0)
Relative Index: INVALID (ref 0.0–2.5)
Total CK: 43 U/L (ref 7–232)

## 2015-03-31 LAB — HEPATITIS C ANTIBODY: HCV AB: NEGATIVE

## 2015-03-31 LAB — HEPATITIS B SURFACE ANTIBODY,QUALITATIVE: HEP B S AB: NONREACTIVE

## 2015-03-31 LAB — COMPREHENSIVE METABOLIC PANEL
ALK PHOS: 195 U/L — AB (ref 39–117)
ALT: 123 U/L — AB (ref 0–53)
AST: 60 U/L — ABNORMAL HIGH (ref 0–37)
Albumin: 2.9 g/dL — ABNORMAL LOW (ref 3.5–5.2)
Anion gap: 7 (ref 5–15)
BILIRUBIN TOTAL: 1.1 mg/dL (ref 0.3–1.2)
BUN: 15 mg/dL (ref 6–23)
CO2: 25 mmol/L (ref 19–32)
Calcium: 8.8 mg/dL (ref 8.4–10.5)
Chloride: 106 mmol/L (ref 96–112)
Creatinine, Ser: 0.96 mg/dL (ref 0.50–1.35)
GFR calc Af Amer: >90 mL/min (ref >90)
GFR, EST NON AFRICAN AMERICAN: 84 mL/min — AB (ref >90)
GLUCOSE: 137 mg/dL — AB (ref 70–99)
Potassium: 3.8 mmol/L (ref 3.5–5.1)
Sodium: 138 mmol/L (ref 135–145)
Total Protein: 6.7 g/dL (ref 6.0–8.3)

## 2015-03-31 LAB — HEPATITIS A ANTIBODY, TOTAL: Hep A Total Ab: NEGATIVE

## 2015-03-31 LAB — TISSUE TRANSGLUTAMINASE, IGA: Tissue Transglutaminase Ab, IgA: <2 U/mL (ref 0–3)

## 2015-03-31 LAB — GLIADIN ANTIBODIES, SERUM
GLIADIN IGA: 6 U (ref 0–19)
Gliadin IgG: 3 units (ref 0–19)

## 2015-03-31 LAB — HEPATITIS B SURFACE ANTIGEN: Hepatitis B Surface Ag: NEGATIVE

## 2015-03-31 LAB — ANTI-SMOOTH MUSCLE ANTIBODY, IGG: F-ACTIN AB IGG: 8 U (ref 0–19)

## 2015-03-31 LAB — HEPARIN LEVEL (UNFRACTIONATED): Heparin Unfractionated: 0.14 IU/mL — ABNORMAL LOW (ref 0.30–0.70)

## 2015-03-31 MED ORDER — HEPARIN (PORCINE) IN NACL 100-0.45 UNIT/ML-% IJ SOLN
1700.0000 [IU]/h | INTRAMUSCULAR | Status: AC
  Administered 2015-03-31: 1400 [IU]/h via INTRAVENOUS
  Administered 2015-04-01: 1700 [IU]/h via INTRAVENOUS
  Filled 2015-03-31 (×4): qty 250

## 2015-03-31 MED ORDER — HEPARIN BOLUS VIA INFUSION
2000.0000 [IU] | Freq: Once | INTRAVENOUS | Status: AC
  Administered 2015-03-31: 2000 [IU] via INTRAVENOUS
  Filled 2015-03-31: qty 2000

## 2015-03-31 MED ORDER — HEPARIN BOLUS VIA INFUSION
2500.0000 [IU] | Freq: Once | INTRAVENOUS | Status: AC
  Administered 2015-03-31: 2500 [IU] via INTRAVENOUS
  Filled 2015-03-31: qty 2500

## 2015-03-31 MED ORDER — IOHEXOL 300 MG/ML  SOLN: 80.0000 mL | Freq: Once | INTRAMUSCULAR | Status: DC | PRN

## 2015-03-31 MED ORDER — IOHEXOL 350 MG/ML SOLN
100.0000 mL | Freq: Once | INTRAVENOUS | Status: AC | PRN
  Administered 2015-03-31: 100 mL via INTRAVENOUS

## 2015-03-31 MED ORDER — CETYLPYRIDINIUM CHLORIDE 0.05 % MT LIQD
7.0000 mL | Freq: Two times a day (BID) | OROMUCOSAL | Status: DC
  Administered 2015-03-31 – 2015-04-02 (×4): 7 mL via OROMUCOSAL

## 2015-03-31 NOTE — Progress Notes (Signed)
  Echocardiogram 2D Echocardiogram has been performed.  Cathie BeamsGREGORY, Tammee Thielke 03/31/2015, 2:34 PM

## 2015-03-31 NOTE — Progress Notes (Signed)
Patient c/o not feeling well, after assessment of patient he appears flushed in the face, breathing faster the normal, c/o SOB with chest discomfort, not pain just feel's like a dull sensation on the right upper chest, information obtain from patient, ascultation of heart rhythm patient clearly in sinus tachycardia rate 130 bpm, lung sounds clear and diminished, Dr notified, orders given for 12 lead EKG and Chest X-ray stat, patient in stable condition at this time will continue to monitor patient for the remainder of shift

## 2015-03-31 NOTE — Progress Notes (Signed)
CT results as below.  IV Heparin started. Pulmonary Consult called.  Will transfer to SDU.  CT ANGIOGRAPHY CHEST WITH CONTRAST  TECHNIQUE: Multidetector CT imaging of the chest was performed using the standard protocol during bolus administration of intravenous contrast. Multiplanar CT image reconstructions and MIPs were obtained to evaluate the vascular anatomy.  CONTRAST: 100mL OMNIPAQUE IOHEXOL 350 MG/ML SOLN  COMPARISON: Multiple exams, including 05/20/2013 and 03/31/2015  FINDINGS: Mediastinum/Nodes: Large right saddle embolus with extensive clot in the right upper lobe, right middle lobe, and right lower lobe pulmonary arteries. Moderate left-sided pulmonary embolus involving the upper and lower lobes. Overall clot burden is very high. Right ventricular to left ventricular ratio 2.1, prominently elevated, with some flattening of the interventricular septum indicating elevated right heart pressures.  Lungs/Pleura: Ground-glass density posterior basal segment right lower lobe suspicious for pulmonary hemorrhage. Subsegmental atelectasis in the left lower lobe. Trace right pleural effusion.  Upper abdomen: Punctate calcification in the right hepatic lobe probably from remote inflammation. High density in the gallbladder suggesting sludge.  Musculoskeletal: Thoracic spondylosis.  Review of the MIP images confirms the above findings.  IMPRESSION: 1. Large right saddle embolus and large left-sided pulmonary emboli involving all lobes. High clot burden with elevated right ventricular to left ventricular ratio and elevated right heart pressures. 2. Suspected pulmonary hemorrhage in the right lower lobe. Trace right pleural effusion.  Critical Value/emergent results were called by telephone at the time of interpretation on 03/31/2015 at 11:07 am to [facility], NP , who verbally acknowledged these results.   Electronically Signed  By: Gaylyn RongWalter Liebkemann  M.D.  On: 03/31/2015 11:13

## 2015-03-31 NOTE — Progress Notes (Signed)
Pharmacy: Re- heparin  Patient's a 69 y.o. M on heparin for new bilateral PE. First heparin level now back sub-therapeutic at 0.14. Per RN, no issues with IV line and no bleeding noted.  Plan: - heparin 2500 units IV bolus, then increase drip to 1700 units/hr - recheck another 6 hour heparin level  Dorna LeitzAnh Blessen Kimbrough, PharmD, BCPS 03/31/2015 7:34 PM

## 2015-03-31 NOTE — Progress Notes (Signed)
Called Nurse to give report, but was informed that they did not know who would be getting this patient and asked if I could call back in a couple of minutes

## 2015-03-31 NOTE — Progress Notes (Signed)
Patient ID: James Barber, male   DOB: [birth year], 69 y.o.   MRN: [id number]     [facility]      [address] [place], [place] [address]    Phone: [phone] FAX: [phone]     Subjective: The patient developed right sided chest pain this morning.  It was 3/10.  It improved with ambulating, however, the patient developed shortness of breath and fatigue. He denies nausea, vomiting, jaw pain, left sided arm pain.  Denies previous cardiac history.  His vitals were stable.   EKG showed ST.  He has been on lovenox since 4/19.  Initially post operatively, the patient did not mobilize, however, in recent days he has been ambulating quite well.  He denies increase in abdominal pain, nausea vomiting, having BMs.    Objective:  Vital signs:  Filed Vitals:   03/30/15 1504 03/30/15 2132 03/31/15 0642 03/31/15 0744  BP: 143/69 133/71 126/76 128/74  Pulse: 84 91 89 133  Temp: 98.3 F (36.8 C) 98.5 F (36.9 C) 98.2 F (36.8 C) 97.4 F (36.3 C)  TempSrc: Oral Oral Oral Oral  Resp: $Remo'20 20 20 18  'BKJNT$ Height:      Weight:      SpO2: 97% 95% 91% 93%    Last BM Date: 03/30/15  Intake/Output   Yesterday:  04/26 0701 - 04/27 0700 In: 240 [P.O.:240] Out: -  This shift:    I/O last 3 completed shifts: In: 240 [P.O.:240] Out: -     Physical Exam: General: Pt awake/alert/oriented x4 in no acute distress Chest: no No chest wall pain w good excursion CV:  Pulses intact.  Regular rhythm Abdomen: Soft.  Nondistended.  Mildly tender at incisions only. Incisions are c/d/i, steri strips.  No evidence of peritonitis.  No incarcerated hernias. Ext:  SCDs BLE.  No mjr edema.  No cyanosis.  No calf tenderness.  Skin: No petechiae / purpura   Problem List:   Principal Problem:   Recurrent SBO (small bowel obstruction) Active Problems:   Bilateral inguinal hernia (BIH), Left > Right   Incisional hernia, without obstruction or gangrene   Diabetes  mellitus, type II   Elevated LFTs   Abnormal liver function tests    Results:   Labs: Results for orders placed or performed during the hospital encounter of 03/21/15 (from the past 68 hour(s))  Urinalysis, Routine w reflex microscopic     Status: Abnormal   Collection Time: 03/29/15 10:00 AM  Result Value Ref Range   Color, Urine AMBER (A) YELLOW    Comment: BIOCHEMICALS MAY BE AFFECTED BY COLOR   APPearance CLEAR CLEAR   Specific Gravity, Urine 1.020 1.005 - 1.030   pH 5.5 5.0 - 8.0   Glucose, UA NEGATIVE NEGATIVE mg/dL   Hgb urine dipstick NEGATIVE NEGATIVE   Bilirubin Urine NEGATIVE NEGATIVE   Ketones, ur 15 (A) NEGATIVE mg/dL   Protein, ur NEGATIVE NEGATIVE mg/dL   Urobilinogen, UA 1.0 0.0 - 1.0 mg/dL   Nitrite NEGATIVE NEGATIVE   Leukocytes, UA NEGATIVE NEGATIVE    Comment: MICROSCOPIC NOT DONE ON URINES WITH NEGATIVE PROTEIN, BLOOD, LEUKOCYTES, NITRITE, OR GLUCOSE <1000 mg/dL.  Culture, Urine     Status: None   Collection Time: 03/29/15 10:00 AM  Result Value Ref Range   Specimen Description URINE, RANDOM    Special Requests Normal    Colony Count NO GROWTH Performed at [facility] Performed  at [facility]     Report Status 03/30/2015 FINAL   Glucose, capillary     Status: Abnormal   Collection Time: 03/29/15  5:14 PM  Result Value Ref Range   Glucose-Capillary 147 (H) 70 - 99 mg/dL  Glucose, capillary     Status: Abnormal   Collection Time: 03/29/15  9:03 PM  Result Value Ref Range   Glucose-Capillary 165 (H) 70 - 99 mg/dL  Glucose, capillary     Status: Abnormal   Collection Time: 03/30/15  7:46 AM  Result Value Ref Range   Glucose-Capillary 131 (H) 70 - 99 mg/dL  Comprehensive metabolic panel     Status: Abnormal   Collection Time: 03/30/15  8:47 AM  Result Value Ref Range   Sodium 135 135 - 145 mmol/L   Potassium 3.8 3.5 - 5.1 mmol/L   Chloride 105 96 - 112 mmol/L   CO2 24 19 - 32 mmol/L   Glucose, Bld 136  (H) 70 - 99 mg/dL   BUN 14 6 - 23 mg/dL   Creatinine, Ser 0.92 0.50 - 1.35 mg/dL   Calcium 8.7 8.4 - 10.5 mg/dL   Total Protein 6.8 6.0 - 8.3 g/dL   Albumin 3.1 (L) 3.5 - 5.2 g/dL   AST 72 (H) 0 - 37 U/L   ALT 138 (H) 0 - 53 U/L   Alkaline Phosphatase 221 (H) 39 - 117 U/L   Total Bilirubin 1.4 (H) 0.3 - 1.2 mg/dL   GFR calc non Af Amer 86 (L) >90 mL/min   GFR calc Af Amer >90 >90 mL/min    Comment: (NOTE) The eGFR has been calculated using the CKD EPI equation. This calculation has not been validated in all clinical situations. eGFR's persistently <90 mL/min signify possible Chronic Kidney Disease.    Anion gap 6 5 - 15  Glucose, capillary     Status: Abnormal   Collection Time: 03/30/15 11:48 AM  Result Value Ref Range   Glucose-Capillary 146 (H) 70 - 99 mg/dL  Gamma GT     Status: Abnormal   Collection Time: 03/30/15  3:17 PM  Result Value Ref Range   GGT 194 (H) 7 - 51 U/L    Comment: Performed at [facility]  TSH Once     Status: None   Collection Time: 03/30/15  3:17 PM  Result Value Ref Range   TSH 3.046 0.350 - 4.500 uIU/mL  Hepatitis B surface antigen     Status: None   Collection Time: 03/30/15  3:17 PM  Result Value Ref Range   Hepatitis B Surface Ag NEGATIVE NEGATIVE    Comment: Performed at [facility]  Hepatitis C antibody     Status: None   Collection Time: 03/30/15  3:17 PM  Result Value Ref Range   HCV Ab NEGATIVE NEGATIVE    Comment: Performed at [facility]     Status: None   Collection Time: 03/30/15  3:17 PM  Result Value Ref Range   Prothrombin Time 14.4 11.6 - 15.2 seconds   INR 1.11 0.00 - 1.49  Glucose, capillary     Status: Abnormal   Collection Time: 03/30/15  5:41 PM  Result Value Ref Range   Glucose-Capillary 124 (H) 70 - 99 mg/dL  Glucose, capillary     Status: Abnormal   Collection Time: 03/30/15  9:28 PM  Result Value Ref Range   Glucose-Capillary 150 (H) 70 - 99 mg/dL  Comprehensive  metabolic panel  Status: Abnormal   Collection Time: 03/31/15  5:25 AM  Result Value Ref Range   Sodium 138 135 - 145 mmol/L   Potassium 3.8 3.5 - 5.1 mmol/L   Chloride 106 96 - 112 mmol/L   CO2 25 19 - 32 mmol/L   Glucose, Bld 137 (H) 70 - 99 mg/dL   BUN 15 6 - 23 mg/dL   Creatinine, Ser 0.96 0.50 - 1.35 mg/dL   Calcium 8.8 8.4 - 10.5 mg/dL   Total Protein 6.7 6.0 - 8.3 g/dL   Albumin 2.9 (L) 3.5 - 5.2 g/dL   AST 60 (H) 0 - 37 U/L   ALT 123 (H) 0 - 53 U/L   Alkaline Phosphatase 195 (H) 39 - 117 U/L   Total Bilirubin 1.1 0.3 - 1.2 mg/dL   GFR calc non Af Amer 84 (L) >90 mL/min   GFR calc Af Amer >90 >90 mL/min    Comment: (NOTE) The eGFR has been calculated using the CKD EPI equation. This calculation has not been validated in all clinical situations. eGFR's persistently <90 mL/min signify possible Chronic Kidney Disease.    Anion gap 7 5 - 15  Glucose, capillary     Status: Abnormal   Collection Time: 03/31/15  7:40 AM  Result Value Ref Range   Glucose-Capillary 142 (H) 70 - 99 mg/dL  CBC     Status: Abnormal   Collection Time: 03/31/15  8:45 AM  Result Value Ref Range   WBC 8.6 4.0 - 10.5 K/uL   RBC 3.73 (L) 4.22 - 5.81 MIL/uL   Hemoglobin 11.6 (L) 13.0 - 17.0 g/dL   HCT 35.3 (L) 39.0 - 52.0 %   MCV 94.6 78.0 - 100.0 fL   MCH 31.1 26.0 - 34.0 pg   MCHC 32.9 30.0 - 36.0 g/dL   RDW 13.8 11.5 - 15.5 %   Platelets 493 (H) 150 - 400 K/uL    Imaging / Studies: Dg Chest Port 1 View  03/31/2015   CLINICAL DATA:  Right lower chest pain for 2-3 days.  EXAM: PORTABLE CHEST - 1 VIEW  COMPARISON:  11/11/2012  FINDINGS: The cardiomediastinal silhouette is within normal limits. The lungs are well inflated and clear. There is no evidence of pleural effusion or pneumothorax. Lower cervical anterior fusion hardware is partially visualized.  IMPRESSION: No active disease.   Electronically Signed   By: Logan Bores   On: 03/31/2015 08:50    Medications / Allergies:  Scheduled  Meds: . aspirin EC  81 mg Oral Daily  . enoxaparin (LOVENOX) injection  40 mg Subcutaneous Q24H  . gabapentin  1,200 mg Oral TID  . levothyroxine  50 mcg Oral QAC breakfast  . lip balm  1 application Topical BID  . lisinopril  10 mg Oral Daily  . nabumetone  750 mg Oral BID  . pantoprazole  40 mg Oral Q48H  . tamsulosin  0.4 mg Oral Daily   Continuous Infusions:  PRN Meds:.acetaminophen, cyclobenzaprine, hydrALAZINE, menthol-cetylpyridinium, morphine injection, ondansetron, oxyCODONE-acetaminophen, phenol  Antibiotics: Anti-infectives    Start     Dose/Rate Route Frequency Ordered Stop   03/22/15 2200  cefoTEtan (CEFOTAN) 2 g in dextrose 5 % 50 mL IVPB     2 g 100 mL/hr over 30 Minutes Intravenous  Once 03/22/15 1417 03/22/15 2218   03/22/15 0600  cefoTEtan (CEFOTAN) 2 g in dextrose 5 % 50 mL IVPB    Comments:  Pharmacy may adjust dose strength for optimal dosing.   Send  with patient on call to the OR.  Anesthesia to complete antibiotic administration <20min prior to incision per [facility]   2 g 100 mL/hr over 30 Minutes Intravenous On call to O.R. 03/21/15 0805 03/22/15 1000        Assessment/Plan POD#9 diagnostic laparoscopy, LOA, distal SBR---Dr. Redmond Pulling 03/22/15 -abdomen is soft and minimally tender to his incisions, having bowel function and tolerating POs, normal white count.  Doubt he has a leak. -bedrest until CT results Tachycardia/SOB/fatigue--chest pain is atypical, right lateral chest wall, improves with activity.  EKG shows sinus tachycardia without ST elevation of depression.  Awaiting cardiac enzymes although my suspicion of cardiac etiology is low.  More concerned for PE given SOB and tachycardia.  Will proceed with a CT of chest.  The patient has been on lovenox and SCDs.  White count is normal.  H&H are stable.  Renal panel from this morning is stable.  I do not think he needs a higher level of care at this point, but will consider. Abnormal LFTs-improved  today.  Scheduled for [place], low priority with above concerns, will change to bedside.  OP f/u with Dr. Deatra Ina Diabetes mellitus type II, controlled--A1c 7.4.  He will need PCP follow up for drug initiation and further follow up.  FEN-no issues, on PO pain meds VTE prophylaxis-SCD/lovenox Dispo-continue inpatient    Erby Pian, [facility] Pager [phone](7A-4:30P)   03/31/2015 9:47 AM

## 2015-03-31 NOTE — Progress Notes (Signed)
Called Nurse to give report and was told that the Nurse was not ready that she was with a patient in a isolation room, and asked if I could call back

## 2015-03-31 NOTE — Progress Notes (Signed)
ANTICOAGULATION CONSULT NOTE - Initial Consult  Pharmacy Consult for Heparin Indication: rule out PE  No Known Allergies  Patient Measurements: Height: 6' (182.9 cm) Weight: 185 lb 6.5 oz (84.1 kg) IBW/kg (Calculated) : 77.6 Heparin Dosing Weight: 84kg  Vital Signs: Temp: 97.4 F (36.3 C) (04/27 0744) Temp Source: Oral (04/27 0744) BP: 128/74 mmHg (04/27 0744) Pulse Rate: 133 (04/27 0744)  Labs:  Recent Labs  03/29/15 0911 03/30/15 0847 03/30/15 1517 03/31/15 0525 03/31/15 0845  HGB 11.1*  --   --   --  11.6*  HCT 32.8*  --   --   --  35.3*  PLT 425*  --   --   --  493*  LABPROT  --   --  14.4  --   --   INR  --   --  1.11  --   --   CREATININE 1.01 0.92  --  0.96  --   CKTOTAL  --   --   --   --  43  CKMB  --   --   --   --  2.1  TROPONINI  --   --   --   --  0.07*   Estimated Creatinine Clearance: 83.1 mL/min (by C-G formula based on Cr of 0.96).  Medical History: Past Medical History  Diagnosis Date  . Hypertension   . GERD (gastroesophageal reflux disease)     takes  otc every  other day  . Bowel obstruction   . Diabetes mellitus without complication    Medications:  Scheduled:  . aspirin EC  81 mg Oral Daily  . gabapentin  1,200 mg Oral TID  . levothyroxine  50 mcg Oral QAC breakfast  . lip balm  1 application Topical BID  . lisinopril  10 mg Oral Daily  . nabumetone  750 mg Oral BID  . pantoprazole  40 mg Oral Q48H  . tamsulosin  0.4 mg Oral Daily   Infusions:    Assessment: [age] yoM s/p Expl Lap 4/18 with LOA and distal SB resection. POD9: onset of R-sided chest pain, concern for possible PE, plan chest CT  Lovenox 40mg  daily from 4/19, last dose 4/26 ~ 10:30  Begin Heparin infusion, Pharmacy to dose  Goal of Therapy:  Heparin level 0.3-0.7 units/ml Monitor platelets by anticoagulation protocol: Yes   Plan:   Heparin load 2000 units, infusion at 1400 units/hr  Check 6 hr Heparin level  Follow up results chest CT  Daily CBC, and  Heparin level daily once rate stable  Otho BellowsGreen, Abdo Denault L PharmD Pager [phone] 03/31/2015, 11:56 AM

## 2015-03-31 NOTE — Consult Note (Signed)
Name: James Barber MRN: [id number] DOB: [birth year]    ADMISSION DATE:  03/21/2015 CONSULTATION DATE:  03/31/15  REFERRING MD :  CCS   CHIEF COMPLAINT:  PE   BRIEF PATIENT DESCRIPTION: 69 y/o M admitted 4/11 with abdominal pain, N/V.  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  Was conservatively managed and d/c'd home 4/16.  Returned 4/17 with recurrent abd pain & distention.  He underwent diagnostic laparoscopy, lysis of adhesions and distal small bowel resection on 4/18.  He developed pleuritic chest pain 4/27 and new oxygen requirements and CT of the chest was obtained which was positive for PE.  PCCM consulted for evaluation.    SIGNIFICANT EVENTS  4/11  Admit with abdominal pain, N/V.  Work up + for SBO.   4/18  To OR for diagnostic laparoscopy, lysis of adhesions and distal small bowel resection  4/20  Fever 103, reluctant to ambulate due to pain.  Difficulties with urination 4/25  Ongoing issues with urinary urgency 4/26  LFT's rising, seen by GI 4/27  Developed R sided chest pain, SOB with ambulation.  CTA of chest positive for PE.  PCCM consulted   STUDIES:  4/27  CTA Chest >> large R saddle embolus and large L PE involving all lobes, high clot burden w elevated RV to LV ratio (2.1), some flattening of intraventricular septum, suspected pulmonary hemorrhage in RLL, trace R pleural effusion   HISTORY OF PRESENT ILLNESS:  69 y/o M with a PMH of HTN, GERD, DM, and prior abdominal surgeries who was admitted on 4/11 with abdominal pain, N/V. 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  The patient was admitted by [facility] and treated with NGT decompression and IVF.  He improved, diet was advanced and discharged home on 4/16.    The patient returned on 4/17 with  recurrent abd pain & distention.  He underwent diagnostic laparoscopy, lysis of adhesions and distal small bowel resection on 4/18.  Post operatively, he had difficulties with pain management, reluctance for mobility, urinary urgency and fever.  He was on DVT Rx lovenox after surgery.  He had been noted to have DOE.  Unfortunately, on 4/27 he developed pleuritic chest pain 4/27 and new oxygen requirements.  CT of the chest was obtained which was positive for PE.  Pt denies syncope or pre-syncope.  PCCM consulted for evaluation.     PAST MEDICAL HISTORY :   has a past medical history of Hypertension; GERD (gastroesophageal reflux disease); Bowel obstruction; and Diabetes mellitus without complication.  has past surgical history that includes Abdominal surgery; Back surgery; Cervical fusion; Hernia repair; Anterior cervical decomp/discectomy fusion (11/12/2012); and laparotomy (N/A, 03/22/2015).    Prior to Admission medications   Medication Sig Start Date End Date Taking? Authorizing Provider  aspirin EC 81 MG tablet Take 81 mg by mouth daily.   Yes Historical Provider, MD  cyclobenzaprine (FLEXERIL) 10 MG tablet Take 10 mg by mouth daily as needed for muscle spasms.   Yes Historical Provider, MD  diphenhydrAMINE (BENADRYL) 25 MG tablet Take 25 mg by mouth at bedtime.   Yes Historical Provider, MD  gabapentin (NEURONTIN) 400 MG capsule Take 1,200 mg by mouth 3 (three) times daily.    Yes Historical Provider, MD  levothyroxine (SYNTHROID,  LEVOTHROID) 50 MCG tablet Take 50 mcg by mouth daily before breakfast.   Yes Historical Provider, MD  Multiple Vitamin (MULTIVITAMIN WITH MINERALS) TABS tablet Take 1 tablet by mouth daily.   Yes Historical Provider, MD  nabumetone (RELAFEN) 750 MG tablet Take 750 mg by mouth 2 (two) times daily.   Yes Historical Provider, MD  omeprazole (PRILOSEC OTC) 20 MG tablet Take 20 mg by mouth every other day.   Yes Historical Provider, MD  Polyethylene Glycol 400 0.25 % SOLN  Apply 1 drop to eye 2 (two) times daily as needed (dry eyes).   Yes Historical Provider, MD  pseudoephedrine-acetaminophen (TYLENOL SINUS) 30-500 MG TABS Take 2 tablets by mouth every morning.   Yes Historical Provider, MD  rosuvastatin (CRESTOR) 10 MG tablet Take 10 mg by mouth daily.   Yes Historical Provider, MD  acetaminophen (TYLENOL) 325 MG tablet Take 2 tablets (650 mg total) by mouth every 6 (six) hours as needed for mild pain, moderate pain, fever or headache. 03/29/15   Sherrie George, PA-C  ferrous sulfate 325 (65 FE) MG tablet You can resume this after your post op visit as needed. 03/29/15   Sherrie George, PA-C  lisinopril (PRINIVIL,ZESTRIL) 20 MG tablet Resume one daily for systolic blood pressure 120 or greater.  If less call PCP and ask for recommendations. 03/29/15   Sherrie George, PA-C  oxyCODONE-acetaminophen (PERCOCET/ROXICET) 5-325 MG per tablet Take 1-2 tablets by mouth every 4 (four) hours as needed. 03/29/15   Sherrie George, PA-C  tamsulosin (FLOMAX) 0.4 MG CAPS capsule Take 1 capsule (0.4 mg total) by mouth daily. 03/29/15   Sherrie George, PA-C   No Known Allergies  FAMILY HISTORY:  family history is not on file.   SOCIAL HISTORY:  reports that he has never smoked. He does not have any smokeless tobacco history on file. He reports that he does not drink alcohol or use illicit drugs.  REVIEW OF SYSTEMS:   Constitutional: Negative for fever, chills, weight loss, malaise/fatigue and diaphoresis.  HENT: Negative for hearing loss, ear pain, nosebleeds, congestion, sore throat, neck pain, tinnitus and ear discharge.   Eyes: Negative for blurred vision, double vision, photophobia, pain, discharge and redness.  Respiratory: Negative for cough, hemoptysis, sputum production, wheezing and stridor.  Reports pleuritic chest pain on the right and SOB with ambulation - new abrupt onset am 4/27 Cardiovascular: Negative for chest pain, palpitations, orthopnea, claudication,  leg swelling and PND.  Gastrointestinal: Negative for heartburn, nausea, vomiting, abdominal pain, diarrhea, constipation, blood in stool and melena.  Genitourinary: Negative for dysuria, urgency, frequency, hematuria and flank pain.  Musculoskeletal: Negative for myalgias, back pain, joint pain and falls.  Skin: Negative for itching and rash.  Neurological: Negative for dizziness, tingling, tremors, sensory change, speech change, focal weakness, seizures, loss of consciousness, weakness and headaches.  Endo/Heme/Allergies: Negative for environmental allergies and polydipsia. Does not bruise/bleed easily.  SUBJECTIVE: pt reports abrupt onset shortness of breath with ambulation this am with associated SOB  VITAL SIGNS: Temp:  [97.4 F (36.3 C)-98.5 F (36.9 C)] 97.4 F (36.3 C) (04/27 0744) Pulse Rate:  [84-133] 133 (04/27 0744) Resp:  [18-20] 18 (04/27 0744) BP: (126-143)/(69-76) 128/74 mmHg (04/27 0744) SpO2:  [91 %-97 %] 93 % (04/27 0744)  PHYSICAL EXAMINATION: General:  wdwn adult male in NAD Neuro:  AAOx4, speech clear, MAE, no focal deficits  HEENT:  MM pink/moist, no jvd Cardiovascular:  s1s2 rrr, no m/r/g Lungs:  Mild tachypnea but no accessory muscle usage, diminished  on R but clear throughout Abdomen:  NTND, bsx4 active, abd incisions c/d/i Musculoskeletal:  No acute deformities  Skin:  Warm/dry, no edema    Recent Labs Lab 03/29/15 0911 03/30/15 0847 03/31/15 0525  NA 137 135 138  K 3.8 3.8 3.8  CL 104 105 106  CO2 23 24 25   BUN 9 14 15   CREATININE 1.01 0.92 0.96  GLUCOSE 135* 136* 137*    Recent Labs Lab 03/27/15 0534 03/29/15 0911 03/31/15 0845  HGB 10.6* 11.1* 11.6*  HCT 31.7* 32.8* 35.3*  WBC 4.9 5.5 8.6  PLT 296 425* 493*   Ct Angio Chest Pe W/cm &/or Wo Cm  03/31/2015   CLINICAL DATA:  Shortness of Breath.  Fatigue.  EXAM: CT ANGIOGRAPHY CHEST WITH CONTRAST  TECHNIQUE: Multidetector CT imaging of the chest was performed using the standard  protocol during bolus administration of intravenous contrast. Multiplanar CT image reconstructions and MIPs were obtained to evaluate the vascular anatomy.  CONTRAST:  100mL OMNIPAQUE IOHEXOL 350 MG/ML SOLN  COMPARISON:  Multiple exams, including 05/20/2013 and 03/31/2015  FINDINGS: Mediastinum/Nodes: Large right saddle embolus with extensive clot in the right upper lobe, right middle lobe, and right lower lobe pulmonary arteries. Moderate left-sided pulmonary embolus involving the upper and lower lobes. Overall clot burden is very high. Right ventricular to left ventricular ratio 2.1, prominently elevated, with some flattening of the interventricular septum indicating elevated right heart pressures.  Lungs/Pleura: Ground-glass density posterior basal segment right lower lobe suspicious for pulmonary hemorrhage. Subsegmental atelectasis in the left lower lobe. Trace right pleural effusion.  Upper abdomen: Punctate calcification in the right hepatic lobe probably from remote inflammation. High density in the gallbladder suggesting sludge.  Musculoskeletal: Thoracic spondylosis.  Review of the MIP images confirms the above findings.  IMPRESSION: 1. Large right saddle embolus and large left-sided pulmonary emboli involving all lobes. High clot burden with elevated right ventricular to left ventricular ratio and elevated right heart pressures. 2. Suspected pulmonary hemorrhage in the right lower lobe. Trace right pleural effusion.  Critical Value/emergent results were called by telephone at the time of interpretation on 03/31/2015 at 11:07 am to [facility]IEBOCK, NP , who verbally acknowledged these results.   Electronically Signed   By: Gaylyn RongWalter  Liebkemann M.D.   On: 03/31/2015 11:13   [place] Abdomen Complete  03/31/2015   CLINICAL DATA:  Elevated LFTs  EXAM: ULTRASOUND ABDOMEN COMPLETE  COMPARISON:  CT abdomen pelvis dated 03/15/2015  FINDINGS: Gallbladder: Gallbladder sludge. No gallbladder wall thickening or  pericholecystic fluid. Negative sonographic Murphy's sign.  Common bile duct: Diameter: 3 mm  Liver: No focal lesion identified. Within normal limits in parenchymal echogenicity.  IVC: No abnormality visualized.  Pancreas: Poorly visualized.  Spleen: Size and appearance within normal limits.  Right Kidney: Length: 11.9 cm.  No mass or hydronephrosis.  Left Kidney: Length: 12.8 cm.  No mass or hydronephrosis.  Abdominal aorta: No aneurysm visualized.  Other findings: None.  IMPRESSION: Gallbladder sludge. No sonographic findings to suggest acute cholecystitis.  Otherwise negative abdominal ultrasound.   Electronically Signed   By: Charline BillsSriyesh  Krishnan M.D.   On: 03/31/2015 11:07   Dg Chest Port 1 View  03/31/2015   CLINICAL DATA:  Right lower chest pain for 2-3 days.  EXAM: PORTABLE CHEST - 1 VIEW  COMPARISON:  11/11/2012  FINDINGS: The cardiomediastinal silhouette is within normal limits. The lungs are well inflated and clear. There is no evidence of pleural effusion or pneumothorax. Lower cervical anterior fusion hardware is  partially visualized.  IMPRESSION: No active disease.   Electronically Signed   By: Sebastian Ache   On: 03/31/2015 08:50    ASSESSMENT / PLAN:   Pulmonary Embolism - provoked in setting of immobility and recent surgical procedure.   Tachycardia / Tachypnea - secondary to above Bowel Obstruction s/p Laparoscopic surgery with lysis of adhesions and small bowel resection  Plan: Heparin gtt per pharmacy  No indication for thrombolytics at this time Monitor for bleeding with recent surgery Will discuss oral anticoagulation with patient when ready to transition  Bedrest until heparin level therapeutic  Oxygen to support saturations > 90% Intermittent CXR Assess ECHO   Trend troponin    Thank you for the consult.  We will continue to follow along with you.   Canary Brim, NP-C [facility] Pgr: [phone] or [phone]    03/31/2015, 12:44 PM

## 2015-03-31 NOTE — Progress Notes (Signed)
Call back after 40 minutes to give report, was told by staff that they had a patient to start on dialysis and one to transfer to my unit, and asked if I could wait until we could make an even swap, because they were losing staff and had to switch staff around, and ask if I could wait until than, patient in stable condition at this time will continue to monitor patient for the remainder of shift

## 2015-03-31 NOTE — Progress Notes (Signed)
    Progress Note   Subjective  feels okay from GI standpoint but having problems with rapid heart rate / chest discomfort..    Objective   Vital signs in last 24 hours: Temp:  [97.4 F (36.3 C)-98.5 F (36.9 C)] 97.4 F (36.3 C) (04/27 0744) Pulse Rate:  [84-133] 133 (04/27 0744) Resp:  [18-20] 18 (04/27 0744) BP: (126-143)/(69-76) 128/74 mmHg (04/27 0744) SpO2:  [91 %-97 %] 93 % (04/27 0744) Last BM Date: 03/30/15 General:    white male in NAD. Ultrasound tech at bedside Heart:  Regular rhythm, tachy in 130's.  Abdomen:  Soft, nontender and nondistended. Normal bowel sounds. Neurologic:  Alert and oriented,  grossly normal neurologically. Psych:  Cooperative. Normal mood and affect.    Lab Results:  Recent Labs  03/29/15 0911 03/31/15 0845  WBC 5.5 8.6  HGB 11.1* 11.6*  HCT 32.8* 35.3*  PLT 425* 493*   BMET  Recent Labs  03/29/15 0911 03/30/15 0847 03/31/15 0525  NA 137 135 138  K 3.8 3.8 3.8  CL 104 105 106  CO2 23 24 25   GLUCOSE 135* 136* 137*  BUN 9 14 15   CREATININE 1.01 0.92 0.96  CALCIUM 8.7 8.7 8.8   LFT  Recent Labs  03/31/15 0525  PROT 6.7  ALBUMIN 2.9*  AST 60*  ALT 123*  ALKPHOS 195*  BILITOT 1.1   PT/INR  Recent Labs  03/30/15 1517  LABPROT 14.4  INR 1.11    Studies/Results: Dg Chest Port 1 View  03/31/2015   CLINICAL DATA:  Right lower chest pain for 2-3 days.  EXAM: PORTABLE CHEST - 1 VIEW  COMPARISON:  11/11/2012  FINDINGS: The cardiomediastinal silhouette is within normal limits. The lungs are well inflated and clear. There is no evidence of pleural effusion or pneumothorax. Lower cervical anterior fusion hardware is partially visualized.  IMPRESSION: No active disease.   Electronically Signed   By: Sebastian AcheAllen  Grady   On: 03/31/2015 08:50     Assessment / Plan:    281. 69 year old male with SBO, s/p LOS and distal small bowel resection 03/22/15.   2. Asymptomatic abnormal LFTs (mixed pattern).  HCV , HBV surface antigen  negative. CTscan unrevealing.  Numbers improved. Medication induced liver injury? Genetic / autoimmune markers of liver disease were ordered yesterday, results pending. Ultrasound just now being done. Will await ultrasound results.   3. Tachycardia, new.  EKG reveals sinus tachycardia. Has been on lovenox. Chest CTscan ordered by surgery. Troponin mildly elevated.     LOS: 10 days   Willette Clusteraula Trinidad Petron  03/31/2015, 9:50 AM

## 2015-04-01 DIAGNOSIS — E119 Type 2 diabetes mellitus without complications: Secondary | ICD-10-CM

## 2015-04-01 LAB — CBC
HCT: 33.3 % — ABNORMAL LOW (ref 39.0–52.0)
HEMOGLOBIN: 11 g/dL — AB (ref 13.0–17.0)
MCH: 31.3 pg (ref 26.0–34.0)
MCHC: 33 g/dL (ref 30.0–36.0)
MCV: 94.6 fL (ref 78.0–100.0)
PLATELETS: 501 10*3/uL — AB (ref 150–400)
RBC: 3.52 MIL/uL — AB (ref 4.22–5.81)
RDW: 13.9 % (ref 11.5–15.5)
WBC: 6.5 10*3/uL (ref 4.0–10.5)

## 2015-04-01 LAB — GLUCOSE, CAPILLARY
GLUCOSE-CAPILLARY: 136 mg/dL — AB (ref 70–99)
GLUCOSE-CAPILLARY: 133 mg/dL — AB (ref 70–99)
GLUCOSE-CAPILLARY: 104 mg/dL — AB (ref 70–99)
Glucose-Capillary: 96 mg/dL (ref 70–99)

## 2015-04-01 LAB — HEPATIC FUNCTION PANEL
ALK PHOS: 189 U/L — AB (ref 39–117)
ALT: 110 U/L — ABNORMAL HIGH (ref 0–53)
AST: 50 U/L — ABNORMAL HIGH (ref 0–37)
Albumin: 3.1 g/dL — ABNORMAL LOW (ref 3.5–5.2)
BILIRUBIN INDIRECT: 0.9 mg/dL (ref 0.3–0.9)
BILIRUBIN TOTAL: 1.1 mg/dL (ref 0.3–1.2)
Bilirubin, Direct: 0.2 mg/dL (ref 0.0–0.5)
TOTAL PROTEIN: 6.6 g/dL (ref 6.0–8.3)

## 2015-04-01 LAB — RETICULIN ANTIBODIES, IGA W TITER: Reticulin Ab, IgA: NEGATIVE titer (ref <2.5)

## 2015-04-01 LAB — HEPARIN LEVEL (UNFRACTIONATED): HEPARIN UNFRACTIONATED: 0.41 [IU]/mL (ref 0.30–0.70)

## 2015-04-01 MED ORDER — RIVAROXABAN (XARELTO) EDUCATION KIT FOR DVT/PE PATIENTS
PACK | Freq: Once | Status: AC
  Administered 2015-04-01: 11:00:00
  Filled 2015-04-01: qty 1

## 2015-04-01 MED ORDER — RIVAROXABAN 15 MG PO TABS
15.0000 mg | ORAL_TABLET | Freq: Two times a day (BID) | ORAL | Status: DC
  Administered 2015-04-01 – 2015-04-02 (×2): 15 mg via ORAL
  Filled 2015-04-01 (×3): qty 1

## 2015-04-01 MED ORDER — GLYBURIDE 2.5 MG PO TABS
2.5000 mg | ORAL_TABLET | Freq: Every day | ORAL | Status: DC
  Administered 2015-04-01 – 2015-04-02 (×2): 2.5 mg via ORAL
  Filled 2015-04-01 (×3): qty 1

## 2015-04-01 NOTE — Progress Notes (Signed)
Bilateral lower extremity venous duplex completed:  No evidence of DVT, superficial thrombosis, or Baker's cyst.   

## 2015-04-01 NOTE — Care Management Note (Signed)
    Page 1 of 1   04/02/2015     3:27:27 PM CARE MANAGEMENT NOTE 04/02/2015  Patient:  James Barber,James Barber   Account Number:  [id number]  Date Initiated:  03/21/2015  Documentation initiated by:  DAVIS,RHONDA  Subjective/Objective Assessment:   pt with recurrence of sbo less than 24 hours after dcd to home.  sbo confirmed by films     Action/Plan:   home when stable   Anticipated DC Date:  04/02/2015   Anticipated DC Plan:  HOME/SELF CARE  In-house referral  NA      DC Planning Services  NA      Choice offered to / List presented to:             Status of service:  Completed, signed off Medicare Important Message given?  YES (If response is "NO", the following Medicare IM given date fields will be blank) Date Medicare IM given:  04/02/2015 Medicare IM given by:  [facility]Alie Hardgrove Date Additional Medicare IM given:   Additional Medicare IM given by:    Discharge Disposition:  HOME/SELF CARE  Per UR Regulation:  Reviewed for med. necessity/level of care/duration of stay  If discussed at Long Length of Stay Meetings, dates discussed:    Comments:  04/02/15 Lanier ClamKathy Nastashia Gallo RN BSN NCM [phone] Barber/c home no needs or orders.  April 01, 2015/Rhonda L. Earlene Plateravis, RN, BSN, CCM. Case Management [facility] [phone] No discharge needs present of time of review. patient transferred to sdu after developing dyspnea and increased wob-cxr-pulmonary emboli.   March 21, 2015/Rhonda L. Earlene Plateravis, RN, BSN, CCM. Case Management [facility] [phone] No discharge needs present of time of review.

## 2015-04-01 NOTE — Progress Notes (Signed)
10 Days Post-Op  Subjective: He's on O2, Dr. Kendrick Fries is going to wean that off and start him on oral anticoagulation.  He is also going to look at his glucose and make some recommendations.  If he does well aim for discharge tomorrow.  Objective: Vital signs in last 24 hours: Temp:  [97.8 F (36.6 C)-98.1 F (36.7 C)] 98 F (36.7 C) (04/28 0800) Pulse Rate:  [81-132] 84 (04/28 0800) Resp:  [16-24] 16 (04/28 0800) BP: (116-139)/(53-92) 120/66 mmHg (04/28 0755) SpO2:  [89 %-99 %] 98 % (04/28 0800) Last BM Date: 03/30/15 Carb modified diet Afebrile, VSS  HR is better, and BP stable Intake/Output from previous day: Glucose ranging from 126-181 Troponin's up slightly   04/27 0701 - 04/28 0700 In: 550.4 [P.O.:240; I.V.:310.4] Out: 550 [Urine:550] Intake/Output this shift: Total I/O In: 17 [I.V.:17] Out: -   General appearance: alert, cooperative and no distress Resp: clear to auscultation bilaterally and down some in base GI: soft + BS, anxious he did not have a Bm yesterday.  Incision looks fine.  Lab Results:   Recent Labs  03/31/15 0845 04/01/15 0225  WBC 8.6 6.5  HGB 11.6* 11.0*  HCT 35.3* 33.3*  PLT 493* 501*    BMET  Recent Labs  03/30/15 0847 03/31/15 0525  NA 135 138  K 3.8 3.8  CL 105 106  CO2 24 25  GLUCOSE 136* 137*  BUN 14 15  CREATININE 0.92 0.96  CALCIUM 8.7 8.8   PT/INR  Recent Labs  03/30/15 1517  LABPROT 14.4  INR 1.11     Recent Labs Lab 03/29/15 0911 03/30/15 0847 03/31/15 0525  AST 89* 72* 60*  ALT 150* 138* 123*  ALKPHOS 230* 221* 195*  BILITOT 1.5* 1.4* 1.1  PROT 6.7 6.8 6.7  ALBUMIN 3.0* 3.1* 2.9*     Lipase     Component Value Date/Time   LIPASE 51 03/21/2015 0155     Studies/Results: Ct Angio Chest Pe W/cm &/or Wo Cm  03/31/2015   CLINICAL DATA:  Shortness of Breath.  Fatigue.  EXAM: CT ANGIOGRAPHY CHEST WITH CONTRAST  TECHNIQUE: Multidetector CT imaging of the chest was performed using the standard protocol  during bolus administration of intravenous contrast. Multiplanar CT image reconstructions and MIPs were obtained to evaluate the vascular anatomy.  CONTRAST:  OMNIPAQUE IOHEXOL 350 MG/ML SOLN  COMPARISON:  Multiple exams, including 05/20/2013 and 03/31/2015  FINDINGS: Mediastinum/Nodes: Large right saddle embolus with extensive clot in the right upper lobe, right middle lobe, and right lower lobe pulmonary arteries. Moderate left-sided pulmonary embolus involving the upper and lower lobes. Overall clot burden is very high. Right ventricular to left ventricular ratio 2.1, prominently elevated, with some flattening of the interventricular septum indicating elevated right heart pressures.  Lungs/Pleura: Ground-glass density posterior basal segment right lower lobe suspicious for pulmonary hemorrhage. Subsegmental atelectasis in the left lower lobe. Trace right pleural effusion.  Upper abdomen: Punctate calcification in the right hepatic lobe probably from remote inflammation. High density in the gallbladder suggesting sludge.  Musculoskeletal: Thoracic spondylosis.  Review of the MIP images confirms the above findings.  IMPRESSION: 1. Large right saddle embolus and large left-sided pulmonary emboli involving all lobes. High clot burden with elevated right ventricular to left ventricular ratio and elevated right heart pressures. 2. Suspected pulmonary hemorrhage in the right lower lobe. Trace right pleural effusion.  Critical Value/emergent results were called by telephone at the time of interpretation on 03/31/2015 at 11:07 am to [facility] RIEBOCK,  NP , who verbally acknowledged these results.   Electronically Signed   By: Gaylyn RongWalter  Liebkemann M.D.   On: 03/31/2015 11:13   [place] Abdomen Complete  03/31/2015   CLINICAL DATA:  Elevated LFTs  EXAM: ULTRASOUND ABDOMEN COMPLETE  COMPARISON:  CT abdomen pelvis dated 03/15/2015  FINDINGS: Gallbladder: Gallbladder sludge. No gallbladder wall thickening or pericholecystic fluid.  Negative sonographic Murphy's sign.  Common bile duct: Diameter: 3 mm  Liver: No focal lesion identified. Within normal limits in parenchymal echogenicity.  IVC: No abnormality visualized.  Pancreas: Poorly visualized.  Spleen: Size and appearance within normal limits.  Right Kidney: Length: 11.9 cm.  No mass or hydronephrosis.  Left Kidney: Length: 12.8 cm.  No mass or hydronephrosis.  Abdominal aorta: No aneurysm visualized.  Other findings: None.  IMPRESSION: Gallbladder sludge. No sonographic findings to suggest acute cholecystitis.  Otherwise negative abdominal ultrasound.   Electronically Signed   By: Charline BillsSriyesh  Krishnan M.D.   On: 03/31/2015 11:07   Dg Chest Port 1 View  03/31/2015   CLINICAL DATA:  Right lower chest pain for 2-3 days.  EXAM: PORTABLE CHEST - 1 VIEW  COMPARISON:  11/11/2012  FINDINGS: The cardiomediastinal silhouette is within normal limits. The lungs are well inflated and clear. There is no evidence of pleural effusion or pneumothorax. Lower cervical anterior fusion hardware is partially visualized.  IMPRESSION: No active disease.   Electronically Signed   By: Sebastian AcheAllen  Grady   On: 03/31/2015 08:50    Medications: . antiseptic oral rinse  7 mL Mouth Rinse BID  . aspirin EC  81 mg Oral Daily  . gabapentin  1,200 mg Oral TID  . levothyroxine  50 mcg Oral QAC breakfast  . lip balm  1 application Topical BID  . lisinopril  10 mg Oral Daily  . nabumetone  750 mg Oral BID  . pantoprazole  40 mg Oral Q48H  . tamsulosin  0.4 mg Oral Daily   . heparin 1,700 Units/hr (04/01/15 0800)    Assessment/Plan SBO with prior hx of SBO/PSBO,lysis of adhesions Diagnostic laparoscopy, Laparoscopic lysis of adhesions for 1 hour, Distal small-bowel resection. 03/22/2015, Gaynelle AduEric Wilson, MD. Acute PE 03/31/15 - now in IV heparin AODM Hemoglobin A1C 7.4 Chronic neuropathy with multiple neck surgeries. Post op ileus. Hyponatremia  Hypertension Hypothyroid GERD No antibiotics DVT:  SCD/Lovenox   Plan:  Recheck labs in AM. Transition to oral anticoagulation per CCM, see how he does.  Home when OK with CCM.    LOS: 11 days    Asjia Berrios 04/01/2015

## 2015-04-01 NOTE — Progress Notes (Signed)
Pt in bed AAOX4 voice no complaints NO SOB . SCDs on , calling light within reach. No distress noted

## 2015-04-01 NOTE — Progress Notes (Signed)
Progress Note   Subjective  feels okay.    Objective   Vital signs in last 24 hours: Temp:  [97.8 F (36.6 C)-98.1 F (36.7 C)] 98 F (36.7 C) (04/28 0800) Pulse Rate:  [81-132] 84 (04/28 0800) Resp:  [16-24] 16 (04/28 0800) BP: (116-139)/(53-92) 120/66 mmHg (04/28 0755) SpO2:  [89 %-99 %] 98 % (04/28 0800) Last BM Date: 03/30/15 General:    white male in NAD Heart:  Regular rate and rhythm Abdomen:  Soft, nontender and nondistended. Normal bowel sounds. Extremities:  Without edema. Neurologic:  Alert and oriented,  grossly normal neurologically. Psych:  Cooperative. Normal mood and affect.  Lab Results:  Recent Labs  03/31/15 0845 04/01/15 0225  WBC 8.6 6.5  HGB 11.6* 11.0*  HCT 35.3* 33.3*  PLT 493* 501*   BMET  Recent Labs  03/30/15 0847 03/31/15 0525  NA 135 138  K 3.8 3.8  CL 105 106  CO2 24 25  GLUCOSE 136* 137*  BUN 14 15  CREATININE 0.92 0.96  CALCIUM 8.7 8.8   LFT  Recent Labs  03/31/15 0525  PROT 6.7  ALBUMIN 2.9*  AST 60*  ALT 123*  ALKPHOS 195*  BILITOT 1.1   PT/INR  Recent Labs  03/30/15 1517  LABPROT 14.4  INR 1.11    Studies/Results: Ct Angio Chest Pe W/cm &/or Wo Cm  03/31/2015   CLINICAL DATA:  Shortness of Breath.  Fatigue.  EXAM: CT ANGIOGRAPHY CHEST WITH CONTRAST  TECHNIQUE: Multidetector CT imaging of the chest was performed using the standard protocol during bolus administration of intravenous contrast. Multiplanar CT image reconstructions and MIPs were obtained to evaluate the vascular anatomy.  CONTRAST:  100mL OMNIPAQUE IOHEXOL 350 MG/ML SOLN  COMPARISON:  Multiple exams, including 05/20/2013 and 03/31/2015  FINDINGS: Mediastinum/Nodes: Large right saddle embolus with extensive clot in the right upper lobe, right middle lobe, and right lower lobe pulmonary arteries. Moderate left-sided pulmonary embolus involving the upper and lower lobes. Overall clot burden is very high. Right ventricular to left ventricular  ratio 2.1, prominently elevated, with some flattening of the interventricular septum indicating elevated right heart pressures.  Lungs/Pleura: Ground-glass density posterior basal segment right lower lobe suspicious for pulmonary hemorrhage. Subsegmental atelectasis in the left lower lobe. Trace right pleural effusion.  Upper abdomen: Punctate calcification in the right hepatic lobe probably from remote inflammation. High density in the gallbladder suggesting sludge.  Musculoskeletal: Thoracic spondylosis.  Review of the MIP images confirms the above findings.  IMPRESSION: 1. Large right saddle embolus and large left-sided pulmonary emboli involving all lobes. High clot burden with elevated right ventricular to left ventricular ratio and elevated right heart pressures. 2. Suspected pulmonary hemorrhage in the right lower lobe. Trace right pleural effusion.  Critical Value/emergent results were called by telephone at the time of interpretation on 03/31/2015 at 11:07 am to [facility] LP[facility] RIEBOCK, NP , who verbally acknowledged these results.   Electronically Signed   By: Gaylyn RongWalter  Liebkemann M.D.   On: 03/31/2015 11:13   [place] Abdomen Complete  03/31/2015   CLINICAL DATA:  Elevated LFTs  EXAM: ULTRASOUND ABDOMEN COMPLETE  COMPARISON:  CT abdomen pelvis dated 03/15/2015  FINDINGS: Gallbladder: Gallbladder sludge. No gallbladder wall thickening or pericholecystic fluid. Negative sonographic Murphy's sign.  Common bile duct: Diameter: 3 mm  Liver: No focal lesion identified. Within normal limits in parenchymal echogenicity.  IVC: No abnormality visualized.  Pancreas: Poorly visualized.  Spleen: Size and appearance within normal limits.  Right Kidney: Length: 11.9  cm.  No mass or hydronephrosis.  Left Kidney: Length: 12.8 cm.  No mass or hydronephrosis.  Abdominal aorta: No aneurysm visualized.  Other findings: None.  IMPRESSION: Gallbladder sludge. No sonographic findings to suggest acute cholecystitis.  Otherwise negative abdominal  ultrasound.   Electronically Signed   By: Charline Bills M.D.   On: 03/31/2015 11:07   Dg Chest Port 1 View  03/31/2015   CLINICAL DATA:  Right lower chest pain for 2-3 days.  EXAM: PORTABLE CHEST - 1 VIEW  COMPARISON:  11/11/2012  FINDINGS: The cardiomediastinal silhouette is within normal limits. The lungs are well inflated and clear. There is no evidence of pleural effusion or pneumothorax. Lower cervical anterior fusion hardware is partially visualized.  IMPRESSION: No active disease.   Electronically Signed   By: Sebastian Ache   On: 03/31/2015 08:50     Assessment / Plan:   44. 69 year old male with SBO, s/p LOS and distal small bowel resection 03/22/15. Tolerating solids  2. Asymptomatic abnormal LFTs (mixed pattern). HCV , HBV surface antigen negative. CTscan unrevealing. Numbers improved. Medication induced liver injury? Genetic / autoimmune markers of liver disease pending. Ultrasound yesterday shows only gallbladder sludge  Today's LFTs pending, if still trending down then will plan to see patient in office to complete workup.    3. PE,  EKG reveals sinus tachycardia. On IV Heparin.     LOS: 11 days   Willette Cluster  04/01/2015, 10:26 AM

## 2015-04-01 NOTE — Progress Notes (Signed)
ANTICOAGULATION CONSULT NOTE - Initial Consult  Pharmacy Consult for Heparin -> Xarelto Indication: PE  No Known Allergies  Patient Measurements: Height: 6' (182.9 cm) Weight: 185 lb 6.5 oz (84.1 kg) IBW/kg (Calculated) : 77.6 Heparin Dosing Weight: 84kg  Vital Signs: Temp: 98 F (36.7 C) (04/28 0800) Temp Source: Oral (04/28 0800) BP: 120/66 mmHg (04/28 0755) Pulse Rate: 84 (04/28 0800)  Labs:  Recent Labs  03/30/15 0847 03/30/15 1517 03/31/15 0525 03/31/15 0845 03/31/15 1505 03/31/15 1825 03/31/15 2051 04/01/15 0225  HGB  --   --   --  11.6*  --   --   --  11.0*  HCT  --   --   --  35.3*  --   --   --  33.3*  PLT  --   --   --  493*  --   --   --  501*  LABPROT  --  14.4  --   --   --   --   --   --   INR  --  1.11  --   --   --   --   --   --   HEPARINUNFRC  --   --   --   --   --  0.14*  --  0.41  CREATININE 0.92  --  0.96  --   --   --   --   --   CKTOTAL  --   --   --  43  --   --   --   --   CKMB  --   --   --  2.1  --   --   --   --   TROPONINI  --   --   --  0.07* 0.14*  --  0.12*  --    Estimated Creatinine Clearance: 83.1 mL/min (by C-G formula based on Cr of 0.96).  Medical History: Past Medical History  Diagnosis Date  . Hypertension   . GERD (gastroesophageal reflux disease)     takes  otc every  other day  . Bowel obstruction   . Diabetes mellitus without complication    Medications:  Scheduled:  . antiseptic oral rinse  7 mL Mouth Rinse BID  . aspirin EC  81 mg Oral Daily  . gabapentin  1,200 mg Oral TID  . glyBURIDE  2.5 mg Oral Q breakfast  . levothyroxine  50 mcg Oral QAC breakfast  . lip balm  1 application Topical BID  . lisinopril  10 mg Oral Daily  . nabumetone  750 mg Oral BID  . pantoprazole  40 mg Oral Q48H  . tamsulosin  0.4 mg Oral Daily   Infusions:  . heparin 1,700 Units/hr (04/01/15 0800)   Assessment: 68 yoM s/p expl lap 4/18 with LOA and distal SB resection. POD9: onset of R-sided chest pain. CTA confirmed PE so  heparin was started 4/27.  4/28, ordered to transition to Xarelto.  CBC stable. No bleeding reported/documented.  SCr wnl, CrCl ~5583ml/min.  Currently on home ASA 81mg , and scheduled nabumetone. Both can increase risk of GI bleeding. Also on home PPI therapy every other day. No hx of PUD.  Goal of Therapy:  Appropriate dosing regimen Monitor platelets by anticoagulation protocol: Yes   Plan:   DC 10am heparin level.  At 16:00: Stop heparin infusion and give first Xarelto dose.   Xarelto will be 15mg  PO BID x 21 days then 20mg  daily.   Provide  Xarelto education and prescription voucher.  F/u daily.  MD:  Upon discharge please prescribe the "Xarelto Starter Pack".  Please clarify if ASA is to continue while on Xarelto.   Consider minimizing NSAID dosing and/or increasing PPI to daily.  Charolotte Eke, PharmD, pager [phone]. 04/01/2015,9:45 AM.

## 2015-04-01 NOTE — Progress Notes (Signed)
ANTICOAGULATION CONSULT NOTE - Follow Up Consult  Pharmacy Consult for Heparin Indication: pulmonary embolus  No Known Allergies  Patient Measurements: Height: 6' (182.9 cm) Weight: 185 lb 6.5 oz (84.1 kg) IBW/kg (Calculated) : 77.6 Heparin Dosing Weight:   Vital Signs: Temp: 97.9 F (36.6 C) (04/28 0322) Temp Source: Oral (04/28 0322) BP: 120/81 mmHg (04/27 2305) Pulse Rate: 106 (04/27 2305)  Labs:  Recent Labs  03/29/15 0911 03/30/15 0847 03/30/15 1517 03/31/15 0525 03/31/15 0845 03/31/15 1505 03/31/15 1825 03/31/15 2051 04/01/15 0225  HGB 11.1*  --   --   --  11.6*  --   --   --  11.0*  HCT 32.8*  --   --   --  35.3*  --   --   --  33.3*  PLT 425*  --   --   --  493*  --   --   --  501*  LABPROT  --   --  14.4  --   --   --   --   --   --   INR  --   --  1.11  --   --   --   --   --   --   HEPARINUNFRC  --   --   --   --   --   --  0.14*  --  0.41  CREATININE 1.01 0.92  --  0.96  --   --   --   --   --   CKTOTAL  --   --   --   --  43  --   --   --   --   CKMB  --   --   --   --  2.1  --   --   --   --   TROPONINI  --   --   --   --  0.07* 0.14*  --  0.12*  --     Estimated Creatinine Clearance: 83.1 mL/min (by C-G formula based on Cr of 0.96).   Medications:  Infusions:  . heparin 1,700 Units/hr (04/01/15 0344)    Assessment: Patient with heparin level at goal.  No heparin issues noted.    Goal of Therapy:  Heparin level 0.3-0.7 units/ml Monitor platelets by anticoagulation protocol: Yes   Plan:  Continue with current rate, recheck level at 1000.  Darlina GuysGrimsley Jr, Jacquenette ShoneJulian Crowford 04/01/2015,5:16 AM

## 2015-04-01 NOTE — Progress Notes (Signed)
Name: James HaileyCharles D Barber MRN: [id number] DOB: 11-04-1946    ADMISSION DATE:  03/21/2015 CONSULTATION DATE:  03/31/15  REFERRING MD :  [facility]   CHIEF COMPLAINT:  PE   BRIEF PATIENT DESCRIPTION: 69 y/o M admitted 4/11 with abdominal pain, N/V.  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  Was conservatively managed and d/c'd home 4/16.  Returned 4/17 with recurrent abd pain & distention.  He underwent diagnostic laparoscopy, lysis of adhesions and distal small bowel resection on 4/18.  He developed pleuritic chest pain 4/27 and new oxygen requirements and CT of the chest was obtained which was positive for PE.  PCCM consulted for evaluation.    SIGNIFICANT EVENTS  4/11  Admit with abdominal pain, N/V.  Work up + for SBO.   4/18  To OR for diagnostic laparoscopy, lysis of adhesions and distal small bowel resection  4/20  Fever 103, reluctant to ambulate due to pain.  Difficulties with urination 4/25  Ongoing issues with urinary urgency 4/26  LFT's rising, seen by GI 4/27  Developed R sided chest pain, SOB with ambulation.  CTA of chest positive for PE.  PCCM consulted   STUDIES:  4/27  CTA Chest >> large R saddle embolus and large L PE involving all lobes, high clot burden w elevated RV to LV ratio (2.1), some flattening of intraventricular septum, suspected pulmonary hemorrhage in RLL, trace R pleural effusion 4/27 2 d poor study  HISTORY OF PRESENT ILLNESS:  69 y/o M with a PMH of HTN, GERD, DM, and prior abdominal surgeries who was admitted on 4/11 with abdominal pain, N/V. 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  The patient was admitted by [facility] and treated with NGT decompression and IVF.  He improved, diet was advanced and discharged home on 4/16.    The patient  returned on 4/17 with recurrent abd pain & distention.  He underwent diagnostic laparoscopy, lysis of adhesions and distal small bowel resection on 4/18.  Post operatively, he had difficulties with pain management, reluctance for mobility, urinary urgency and fever.  He was on DVT Rx lovenox after surgery.  He had been noted to have DOE.  Unfortunately, on 4/27 he developed pleuritic chest pain 4/27 and new oxygen requirements.  CT of the chest was obtained which was positive for PE.  Pt denies syncope or pre-syncope.  PCCM consulted for evaluation.      SUBJECTIVE: Sleeping well VITAL SIGNS: Temp:  [97.8 F (36.6 C)-98.1 F (36.7 C)] 98 F (36.7 C) (04/28 0800) Pulse Rate:  [81-132] 84 (04/28 0800) Resp:  [16-24] 16 (04/28 0800) BP: (116-139)/(53-92) 120/66 mmHg (04/28 0755) SpO2:  [89 %-99 %] 98 % (04/28 0800)  PHYSICAL EXAMINATION: General:  wdwn adult male in NAD, denies pain or SOB. Sleeping comfortably Neuro:  AAOx4, speech clear, MAE, no focal deficits  HEENT:  MM pink/moist, no jvd. Oral mucosa moist  Cardiovascular:  s1s2 rrr, no m/r/g Lungs:   no accessory muscle usage, CTA Abdomen:  NTND, bsx4 active, abd incisions c/d/i Musculoskeletal:  No acute deformities  Skin:  Warm/dry, no edema    Recent Labs Lab 03/29/15 0911 03/30/15 0847 03/31/15 0525  NA 137 135 138  K 3.8 3.8 3.8  CL 104 105 106  CO2  BUN CREATININE 1.01 0.92 0.96  GLUCOSE 135* 136* 137*    Recent Labs Lab 03/29/15 0911 03/31/15 0845 04/01/15 0225  HGB 11.1* 11.6* 11.0*  HCT 32.8* 35.3* 33.3*  WBC 5.5 8.6 6.5  PLT 425* 493* 501*   Ct Angio Chest Pe W/cm &/or Wo Cm  03/31/2015   CLINICAL DATA:  Shortness of Breath.  Fatigue.  EXAM: CT ANGIOGRAPHY CHEST WITH CONTRAST  TECHNIQUE: Multidetector CT imaging of the chest was performed using the standard protocol during bolus administration of intravenous contrast. Multiplanar CT image reconstructions and MIPs were obtained to  evaluate the vascular anatomy.  CONTRAST:  OMNIPAQUE IOHEXOL 350 MG/ML SOLN  COMPARISON:  Multiple exams, including 05/20/2013 and 03/31/2015  FINDINGS: Mediastinum/Nodes: Large right saddle embolus with extensive clot in the right upper lobe, right middle lobe, and right lower lobe pulmonary arteries. Moderate left-sided pulmonary embolus involving the upper and lower lobes. Overall clot burden is very high. Right ventricular to left ventricular ratio 2.1, prominently elevated, with some flattening of the interventricular septum indicating elevated right heart pressures.  Lungs/Pleura: Ground-glass density posterior basal segment right lower lobe suspicious for pulmonary hemorrhage. Subsegmental atelectasis in the left lower lobe. Trace right pleural effusion.  Upper abdomen: Punctate calcification in the right hepatic lobe probably from remote inflammation. High density in the gallbladder suggesting sludge.  Musculoskeletal: Thoracic spondylosis.  Review of the MIP images confirms the above findings.  IMPRESSION: 1. Large right saddle embolus and large left-sided pulmonary emboli involving all lobes. High clot burden with elevated right ventricular to left ventricular ratio and elevated right heart pressures. 2. Suspected pulmonary hemorrhage in the right lower lobe. Trace right pleural effusion.  Critical Value/emergent results were called by telephone at the time of interpretation on 03/31/2015 at 11:07 am to [facility], NP , who verbally acknowledged these results.   Electronically Signed   By: Gaylyn Rong M.D.   On: 03/31/2015 11:13   US Abdomen Complete  03/31/2015   CLINICAL DATA:  Elevated LFTs  EXAM: ULTRASOUND ABDOMEN COMPLETE  COMPARISON:  CT abdomen pelvis dated 03/15/2015  FINDINGS: Gallbladder: Gallbladder sludge. No gallbladder wall thickening or pericholecystic fluid. Negative sonographic Murphy's sign.  Common bile duct: Diameter: 3 mm  Liver: No focal lesion identified. Within  normal limits in parenchymal echogenicity.  IVC: No abnormality visualized.  Pancreas: Poorly visualized.  Spleen: Size and appearance within normal limits.  Right Kidney: Length: 11.9 cm.  No mass or hydronephrosis.  Left Kidney: Length: 12.8 cm.  No mass or hydronephrosis.  Abdominal aorta: No aneurysm visualized.  Other findings: None.  IMPRESSION: Gallbladder sludge. No sonographic findings to suggest acute cholecystitis.  Otherwise negative abdominal ultrasound.   Electronically Signed   By: Charline Bills M.D.   On: 03/31/2015 11:07   Dg Chest Port 1 View  03/31/2015   CLINICAL DATA:  Right lower chest pain for 2-3 days.  EXAM: PORTABLE CHEST - 1 VIEW  COMPARISON:  11/11/2012  FINDINGS: The cardiomediastinal silhouette is within normal limits. The lungs are well inflated and clear. There is no evidence of pleural effusion or pneumothorax. Lower cervical anterior fusion hardware is partially visualized.  IMPRESSION: No active disease.   Electronically Signed   By: Sebastian Ache   On: 03/31/2015 08:50    ASSESSMENT / PLAN:   Pulmonary Embolism - provoked in setting of immobility and recent surgical procedure.   Tachycardia / Tachypnea - secondary to above Bowel Obstruction  s/p Laparoscopic surgery with lysis of adhesions and small bowel resection  Plan: Heparin gtt per pharmacy  No indication for thrombolytics at this time Monitor for bleeding with recent surgery Will discuss oral anticoagulation with patient when ready to transition  Bedrest until heparin level therapeutic  Oxygen to support saturations > 90% Intermittent CXR Assess ECHO  , 4/27 2d not a good study. Trend troponin  OK to go to floor from PCCM view.   Brett Canales Minor ACNP [facility] Pager [phone] till 3 pm If no answer page [phone] 04/01/2015, 8:39 AM   Attending:  I have seen and examined the patient with nurse practitioner/resident and agree with the note above.   Mr. Nedeau feels well this morning, no  dyspnea, he has not been out of bed  Echo reviewed> mild RV strain likely, difficult image, LVEF 50%  Blood sugars elevated to 136 AST/ALT trending downwards  Management discussed with surgery 4/28 AM  Impression/plan: Submassive PE> improved symptoms, HD stable, mild RV strain not unexpected, should resolve in the next 3 months; have instructed to ambulate, wean off O2, will check LE doppler to ensure no large residual clot in legs  Elevated blood sugar> new problem to me, he says he was "borderline diabetic" and put on metformin but was intolerant.  Hgb A1c 7.1, continue AC/HS POC glucose monitoring, start Glyburide 2.5mg  daily; needs f/u with PCP  Elevated AST/ALT> improved; felt to be drug effect, continue monitoring, agree with holding crestor for now, needs f/u with PCP, repeat LFT then  OK from my perspective for DC home tomorrow  Heber Buckner, MD [place] [facility] Pager: [phone] Cell: [phone] If no response, call [phone]

## 2015-04-02 LAB — CBC
HCT: 31.5 % — ABNORMAL LOW (ref 39.0–52.0)
Hemoglobin: 10.5 g/dL — ABNORMAL LOW (ref 13.0–17.0)
MCH: 31.5 pg (ref 26.0–34.0)
MCHC: 33.3 g/dL (ref 30.0–36.0)
MCV: 94.6 fL (ref 78.0–100.0)
PLATELETS: 494 10*3/uL — AB (ref 150–400)
RBC: 3.33 MIL/uL — AB (ref 4.22–5.81)
RDW: 13.5 % (ref 11.5–15.5)
WBC: 6.3 10*3/uL (ref 4.0–10.5)

## 2015-04-02 LAB — COMPREHENSIVE METABOLIC PANEL
ALT: 116 U/L — AB (ref 0–53)
AST: 64 U/L — ABNORMAL HIGH (ref 0–37)
Albumin: 2.9 g/dL — ABNORMAL LOW (ref 3.5–5.2)
Alkaline Phosphatase: 200 U/L — ABNORMAL HIGH (ref 39–117)
Anion gap: 6 (ref 5–15)
BUN: 17 mg/dL (ref 6–23)
CALCIUM: 8.5 mg/dL (ref 8.4–10.5)
CO2: 26 mmol/L (ref 19–32)
Chloride: 104 mmol/L (ref 96–112)
Creatinine, Ser: 1.04 mg/dL (ref 0.50–1.35)
GFR, EST AFRICAN AMERICAN: 84 mL/min — AB (ref >90)
GFR, EST NON AFRICAN AMERICAN: 73 mL/min — AB (ref >90)
Glucose, Bld: 118 mg/dL — ABNORMAL HIGH (ref 70–99)
Potassium: 3.6 mmol/L (ref 3.5–5.1)
Sodium: 136 mmol/L (ref 135–145)
TOTAL PROTEIN: 6.1 g/dL (ref 6.0–8.3)
Total Bilirubin: 1.1 mg/dL (ref 0.3–1.2)

## 2015-04-02 LAB — GLUCOSE, CAPILLARY: Glucose-Capillary: 128 mg/dL — ABNORMAL HIGH (ref 70–99)

## 2015-04-02 MED ORDER — GLYBURIDE 2.5 MG PO TABS: 2.5000 mg | ORAL_TABLET | Freq: Every day | ORAL | Status: DC

## 2015-04-02 MED ORDER — RIVAROXABAN (XARELTO) VTE STARTER PACK (15 & 20 MG)
ORAL_TABLET | ORAL | Status: DC
Start: 2015-04-02 — End: 2015-05-31

## 2015-04-02 MED ORDER — RIVAROXABAN 15 MG PO TABS: 15.0000 mg | ORAL_TABLET | Freq: Two times a day (BID) | ORAL | Status: DC

## 2015-04-02 NOTE — Progress Notes (Signed)
[facility]  Chart reviewed, patient off O2, plan d/c home today on Xarelto as planned.  Will schedule f/u with me in 2-4 weeks  Heber CarolinaBrent Pegah Segel, MD [place] [facility] Pager: [phone] Cell: [phone] If no response, call [phone]

## 2015-04-02 NOTE — Discharge Instructions (Signed)
Small Bowel Obstruction A small bowel obstruction is a blockage (obstruction) of the small intestine (small bowel). The small bowel is a long, slender tube that connects the stomach to the colon. Its job is to absorb nutrients from the fluids and foods you consume into the bloodstream.  CAUSES  There are many causes of intestinal blockage. The most common ones include:  Hernias. This is a more common cause in children than adults.  Inflammatory bowel disease (enteritis and colitis).  Twisting of the bowel (volvulus).  Tumors.  Scar tissue (adhesions) from previous surgery or radiation treatment.  Recent surgery. This may cause an acute small bowel obstruction called an ileus. SYMPTOMS   Abdominal pain. This may be dull cramps or sharp pain. It may occur in one area or may be present in the entire abdomen. Pain can range from mild to severe, depending on the degree of obstruction.  Nausea and vomiting. Vomit may be greenish or yellow bile color.  Distended or swollen stomach. Abdominal bloating is a common symptom.  Constipation.  Lack of passing gas.  Frequent belching.  Diarrhea. This may occur if runny stool is able to leak around the obstruction. DIAGNOSIS  Your caregiver can usually diagnose small bowel obstruction by taking a history, doing a physical exam, and taking X-rays. If the cause is unclear, a CT scan (computerized tomography) of your abdomen and pelvis may be needed. TREATMENT  Treatment of the blockage depends on the cause and how bad the problem is.   Sometimes, the obstruction improves with bed rest and intravenous (IV) fluids.  Resting the bowel is very important. This means following a simple diet. Sometimes, a clear liquid diet may be required for several days.  Sometimes, a small tube (nasogastric tube) is placed into the stomach to decompress the bowel. When the bowel is blocked, it usually swells up like a balloon filled with air and fluids.  Decompression means that the air and fluids are removed by suction through that tube. This can help with pain, discomfort, and nausea. It can also help the obstruction resolve faster.  Surgery may be required if other treatments do not work. Bowel obstruction from a hernia may require early surgery and can be an emergency procedure. Adhesions that cause frequent or severe obstructions may also require surgery. HOME CARE INSTRUCTIONS If your bowel obstruction is only partial or incomplete, you may be allowed to go home.  Get plenty of rest.  Follow your diet as directed by your caregiver.  Only consume clear liquids until your condition improves.  Avoid solid foods as instructed. SEEK IMMEDIATE MEDICAL CARE IF:  You have increased pain or cramping.  You vomit blood.  You have uncontrolled vomiting or nausea.  You cannot drink fluids due to vomiting or pain.  You develop confusion.  You begin feeling very dry or thirsty (dehydrated).  You have severe bloating.  You have chills.  You have a fever.  You feel extremely weak or you faint. MAKE SURE YOU:  Understand these instructions.  Will watch your condition.  Will get help right away if you are not doing well or get worse. Document Released: 02/06/2006 Document Revised: 02/12/2012 Document Reviewed: 02/03/2011 [facility] Patient Information 2015 [place], [place]. This information is not intended to replace advice given to you by your health care provider. Make sure you discuss any questions you have with your health care provider.  [facility]      [facility], [place] [phone]  OPEN ABDOMINAL SURGERY: POST OP  INSTRUCTIONS  Always review your discharge instruction sheet given to you by the facility where your surgery was performed.  IF YOU HAVE DISABILITY OR FAMILY LEAVE FORMS, YOU MUST BRING THEM TO THE OFFICE FOR PROCESSING.  PLEASE DO NOT GIVE THEM TO YOUR DOCTOR.  1. A prescription for pain medication may be  given to you upon discharge.  Take your pain medication as prescribed, if needed.  If narcotic pain medicine is not needed, then you may take acetaminophen (Tylenol) or ibuprofen (Advil) as needed. 2. Take your usually prescribed medications unless otherwise directed. 3. If you need a refill on your pain medication, please contact your pharmacy. They will contact our office to request authorization.  Prescriptions will not be filled after 5pm or on week-ends. 4. You should follow a light diet the first few days after arrival home, such as soup and crackers, pudding, etc.unless your doctor has advised otherwise. A high-fiber, low fat diet can be resumed as tolerated.   Be sure to include lots of fluids daily. Most patients will experience some swelling and bruising on the chest and neck area.  Ice packs will help.  Swelling and bruising can take several days to resolve 5. Most patients will experience some swelling and bruising in the area of the incision. Ice pack will help. Swelling and bruising can take several days to resolve..  6. It is common to experience some constipation if taking pain medication after surgery.  Increasing fluid intake and taking a stool softener will usually help or prevent this problem from occurring.  A mild laxative (Milk of Magnesia or Miralax) should be taken according to package directions if there are no bowel movements after 48 hours. 7.  You may have steri-strips (small skin tapes) in place directly over the incision.  These strips should be left on the skin for 7-10 days.  If your surgeon used skin glue on the incision, you may shower in 24 hours.  The glue will flake off over the next 2-3 weeks.  Any sutures or staples will be removed at the office during your follow-up visit. You may find that a light gauze bandage over your incision may keep your staples from being rubbed or pulled. You may shower and replace the bandage daily. 8. ACTIVITIES:  You may resume regular  (light) daily activities beginning the next day--such as daily self-care, walking, climbing stairs--gradually increasing activities as tolerated.  You may have sexual intercourse when it is comfortable.  Refrain from any heavy lifting or straining until approved by your doctor. a. You may drive when you no longer are taking prescription pain medication, you can comfortably wear a seatbelt, and you can safely maneuver your car and apply brakes b. Return to Work: ___________________________________ 9. You should see your doctor in the office for a follow-up appointment approximately two weeks after your surgery.  Make sure that you call for this appointment within a day or two after you arrive home to insure a convenient appointment time. OTHER INSTRUCTIONS:  _____________________________________________________________ _____________________________________________________________  WHEN TO CALL YOUR DOCTOR: 1. Fever over 101.0 2. Inability to urinate 3. Nausea and/or vomiting 4. Extreme swelling or bruising 5. Continued bleeding from incision. 6. Increased pain, redness, or drainage from the incision. 7. Difficulty swallowing or breathing 8. Muscle cramping or spasms. 9. Numbness or tingling in hands or feet or around lips.  The clinic staff is available to answer your questions during regular business hours.  Please dont hesitate to call and ask to  speak to one of the nurses if you have concerns.  For further questions, please visit [email or web address]  Information on my medicine - XARELTO (rivaroxaban)  This medication education was reviewed with me or my healthcare representative as part of my discharge preparation.  The pharmacist that spoke with me during my hospital stay was:  Reece Packer, [facility]  WHY WAS Carlena Hurl PRESCRIBED FOR YOU? 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  What do you need to know about Xarelto? The starting dose is one 15 mg tablet taken TWICE daily with food for the FIRST 21 DAYS then on (enter date)  22  the dose is changed to one 20 mg tablet taken ONCE A DAY with your evening meal.  DO NOT stop taking Xarelto without talking to the health care provider who prescribed the medication.  Refill your prescription for 20 mg tablets before you run out.  After discharge, you should have regular check-up appointments with your healthcare provider that is prescribing your Xarelto.  In the future your dose may need to be changed if your kidney function changes by a significant amount.  What do you do if you miss a dose? If you are taking Xarelto TWICE DAILY and you miss a dose, take it as soon as you remember. You may take two 15 mg tablets (total 30 mg) at the same time then resume your regularly scheduled 15 mg twice daily the next day.  If you are taking Xarelto ONCE DAILY and you miss a dose, take it as soon as you remember on the same day then continue your regularly scheduled once daily regimen the next day. Do not take two doses of Xarelto at the same time.   Important Safety Information Xarelto is a blood thinner medicine that can cause bleeding. You should call your healthcare provider right away if you experience any of the following: ? Bleeding from an injury or your nose that does not stop. ? Unusual colored urine (red or dark brown) or unusual colored stools (red or black). ? Unusual bruising for unknown reasons. ? A serious fall or if you hit your head (even if there is no bleeding).  Some medicines may interact with Xarelto and might increase your risk of bleeding while on Xarelto. To help avoid this, consult your healthcare provider or pharmacist prior to using any new prescription or non-prescription medications, including herbals, vitamins,  non-steroidal anti-inflammatory drugs (NSAIDs) and supplements.  This website has more information on Xarelto: [email or web address].  LAPAROSCOPIC SURGERY: POST OP INSTRUCTIONS  1. DIET: Follow a light bland diet the first 24 hours after arrival home, such as soup, liquids, crackers, etc.  Be sure to include lots of fluids daily.  Avoid fast food or heavy meals as your are more likely to get nauseated.  Eat a low fat the next few days after surgery.   2. Take your usually prescribed home medications unless otherwise directed. 3. PAIN CONTROL: a. Pain is best controlled by a usual combination of three different methods TOGETHER: i. Ice/Heat ii. Over the counter pain medication iii. Prescription pain medication b. Most patients will experience some swelling and bruising around the incisions.  Ice packs or heating pads (30-60 minutes up to 6 times a day) will help. Use ice for the first few days to help decrease swelling and  bruising, then switch to heat to help relax tight/sore spots and speed recovery.  Some people prefer to use ice alone, heat alone, alternating between ice & heat.  Experiment to what works for you.  Swelling and bruising can take several weeks to resolve.   c. It is helpful to take an over-the-counter pain medication regularly for the first few weeks.  Choose one of the following that works best for you: i. Naproxen (Aleve, etc)  Two  tabs twice a day ii. Ibuprofen (Advil, etc) Three  tabs four times a day (every meal & bedtime) iii. Acetaminophen (Tylenol, etc) 500-650mg  four times a day (every meal & bedtime) d. A  prescription for pain medication (such as oxycodone, hydrocodone, etc) should be given to you upon discharge.  Take your pain medication as prescribed.  i. If you are having problems/concerns with the prescription medicine (does not control pain, nausea, vomiting, rash, itching, etc), please call us [phone] to see if we need to switch you to a different  pain medicine that will work better for you and/or control your side effect better. ii. If you need a refill on your pain medication, please contact your pharmacy.  They will contact our office to request authorization. Prescriptions will not be filled after 5 pm or on week-ends. 4. Avoid getting constipated.  Between the surgery and the pain medications, it is common to experience some constipation.  Increasing fluid intake and taking a fiber supplement (such as Metamucil, Citrucel, FiberCon, MiraLax, etc) 1-2 times a day regularly will usually help prevent this problem from occurring.  A mild laxative (prune juice, Milk of Magnesia, MiraLax, etc) should be taken according to package directions if there are no bowel movements after 48 hours.   5. Watch out for diarrhea.  If you have many loose bowel movements, simplify your diet to bland foods & liquids for a few days.  Stop any stool softeners and decrease your fiber supplement.  Switching to mild anti-diarrheal medications (Kayopectate, Pepto Bismol) can help.  If this worsens or does not improve, please call us. 6. Wash / shower every day.  You may shower over the dressings as they are waterproof.  Continue to shower over incision(s) after the dressing is off. 7. Remove your waterproof bandages 5 days after surgery.  You may leave the incision open to air.  You may replace a dressing/Band-Aid to cover the incision for comfort if you wish.  8. ACTIVITIES as tolerated:   a. You may resume regular (light) daily activities beginning the next day--such as daily self-care, walking, climbing stairs--gradually increasing activities as tolerated.  If you can walk 30 minutes without difficulty, it is safe to try more intense activity such as jogging, treadmill, bicycling, low-impact aerobics, swimming, etc. b. Save the most intensive and strenuous activity for last such as sit-ups, heavy lifting, contact sports, etc  Refrain from any heavy lifting or straining  until you are off narcotics for pain control.   c. DO NOT PUSH THROUGH PAIN.  Let pain be your guide: If it hurts to do something, don't do it.  Pain is your body warning you to avoid that activity for another week until the pain goes down. d. You may drive when you are no longer taking prescription pain medication, you can comfortably wear a seatbelt, and you can safely maneuver your car and apply brakes. e. Bonita Quin may have sexual intercourse when it is comfortable.  9. FOLLOW UP in our office a. Please call  [facility] at [phone] to set up an appointment to see your surgeon in the office for a follow-up appointment approximately 2-3 weeks after your surgery. b. Make sure that you call for this appointment the day you arrive home to insure a convenient appointment time. 10. IF YOU HAVE DISABILITY OR FAMILY LEAVE FORMS, BRING THEM TO THE OFFICE FOR PROCESSING.  DO NOT GIVE THEM TO YOUR DOCTOR.   WHEN TO CALL us [phone]: 10. Poor pain control 11. Reactions / problems with new medications (rash/itching, nausea, etc)  12. Fever over 101.5 F (38.5 C) 13. Inability to urinate 14. Nausea and/or vomiting 15. Worsening swelling or bruising 16. Continued bleeding from incision. 17. Increased pain, redness, or drainage from the incision   The clinic staff is available to answer your questions during regular business hours (8:30am-5pm).  Please dont hesitate to call and ask to speak to one of our nurses for clinical concerns.   If you have a medical emergency, go to the nearest emergency room or call 911.  A surgeon from [facility] is always on call at the [facility], [place] [address], [address], [place], [place]  [address] ? MAIN: [phone] ? TOLL FREE: [phone] ?  FAX [phone] [email or web address]   WOUND CARE  It is important that the wound be kept open.   -Keeping the skin edges apart will allow the wound to  gradually heal from the base upwards.   - If the skin edges of the wound close too early, a new fluid pocket can form and infection can occur. -This is the reason to pack deeper wounds with gauze or ribbon -This is why drained wounds cannot be sewed closed right away  A healthy wound should form a lining of bright red "beefy" granulating tissue that will help shrink the wound and help the edges grow new skin into it.   -A little mucus / yellow discharge is normal (the body's natural way to try and form a scab) and should be gently washed off with soap and water with daily dressing changes.  -Green or foul smelling drainage implies bacterial colonization and can slow wound healing - a short course of antibiotic ointment (3-5 days) can help it clear up.  Call the doctor if it does not improve or worsens  -Avoid use of antibiotic ointments for more than a week as they can slow wound healing over time.    -Sometimes other wound care products will be used to reduce need for dressing changes and/or help clean up dirty wounds -Sometimes the surgeon needs to debride the wound in the office to remove dead or infected tissue out of the wound so it can heal more quickly and safely.    Change the dressing at least once a day -Wash the wound with mild soap and water gently every day.  It is good to shower or bathe the wound to help it clean out. -Use clean 4x4 gauze for medium/large wounds or ribbon plain NU-gauze for smaller wounds (it does not need to be sterile, just clean) -Keep the raw wound moist with a little saline or KY (saline) gel on the gauze.  -A dry wound will take longer to heal.  -Keep the skin dry around the wound to prevent breakdown and irritation. -Pack the wound down to the base -The goal is to keep the skin apart, not overpack the wound -Use a Q-tip or blunt-tipped kabob stick toothpick to  push the gauze down to the base in narrow or deep wounds   -Cover with a clean gauze and  tape -paper or Medipore tape tend to be gentle on the skin -rotate the orientation of the tape to avoid repeated stress/trauma on the skin -using an ACE or Coban wrap on wounds on arms or legs can be used instead.  Complete all antibiotics through the entire prescription to help the infection heal and prevent new places of infection   Returning the see the surgeon is helpful to follow the healing process and help the wound close as fast as possible.

## 2015-04-02 NOTE — Discharge Summary (Signed)
Physician Discharge Summary  James Barber:[id number] DOB: May 20, 1946 DOA: 03/21/2015  PCP: [name], MD  Consultation: pulmonary---Dr. Kendrick Fries   GI---Dr. Arlyce Dice  Admit date: 03/21/2015 Discharge date: 04/02/2015  Recommendations for Outpatient Follow-up:   Follow-up Information    Follow up with [name], MD. Go on 04/16/2015.   Specialty:  General Surgery   Why:  arrive by 2:15PM for a post op check   Contact information:   [address] [place] [address] [phone]       Follow up with [name], MD. Schedule an appointment as soon as possible for a visit in 2 weeks.   Specialty:  Unknown Physician Specialty   Why:  For Medical management of issues not related to surgery.  Let them see you post op in 2-3 weeks also.     Contact information:   [address] [place] [address] [phone]       Follow up with Melvia Heaps, MD On 04/19/2015.   Specialty:  Gastroenterology   Why:  appt 10:45, be there at 10:30   Contact information:   [address] [place] [address] [phone]       Follow up with Eartha Inch, MD On 04/19/2015.   Specialty:  Family Medicine   Why:  diabetes check up, lung clots check up   Contact information:   [address] [place] [address] [phone]      Discharge Diagnoses:  1. Small bowel obstruction 2. S/p diagnostic laparoscopic, laparoscopic lysis of adhesions x1 hour, distal small bowel obstruction 3. Acute bilateral pulmonary emboli 4. Type II diabetes mellitus 5. Post operative ileus   Surgical Procedure: Diagnostic laparoscopy, Laparoscopic lysis of adhesions for 1 hour, Distal small-bowel resection----03/22/2015, [name], MD.  Discharge Condition: stable Disposition: home  Diet recommendation: high fiber  Filed Weights   03/21/15 0101 03/21/15 0614 03/22/15 0523  Weight: 86.183 kg (190 lb) 85.1 kg (187 lb 9.8 oz) 84.1 kg (185 lb 6.5 oz)      Filed Vitals:   04/02/15 0525  BP: 131/70  Pulse: 83  Temp: 98 F (36.7 C)  Resp: 18     Hospital Course:  James Barber is a 69 year old male who presented to [facility] for the second time with recurrent abdominal pain and distention. He was found to have a small bowel obstruction.  He failed nonoperative management and underwent hte procedure listed above.  He developed an expected ileus treated with NGT decompression.  Urinary retention resolved with flomax. He was noted to have abnormal LFTs.  GI was consulted and recommended [place] of abdomen which showed sludge, but otherwise normal.  LFTs improved on their own and felt to be related to medication or below diagnosis.  He will follow up with Dr. Arlyce Dice on outpatient basis.  On POD#9 the patient complained of sudden chest pains, sob and fatigue.  He was found to have tachycardia, no hypoxia.  A CT of chest revealed large right saddle embolus and large left sided pulmonary emboli involving all lobes, high clot burden, elevated heart pressures, suspected pulmonary hemorrhage. Despite being on lovenox and SCDs post operatively.  The patient was started on a heparin gtt and transferred to SDU.  We then consulted pulmonary medicine for assistance.  Lower extremity dopplers did not reveal and DVTs.  Echocardiogram revealed EF 50%.  The patient was started on Xarelto.  He remained stable and was transferred to the floor.  His symptoms resolved.  Pulmonary recommended 3 months of therapy for provoked PEs.    He was also started on glyburide for A1c of 7.4.  Apparently, he was unable to tolerate metformin the past.  On POD#10 I partially opened his wound and he was found to have a small hematoma.  I instructed him on dressing changes and home health was ordered to be changed once daily.    He has an establish care appointment with Dr. Cyndia Bent on 5/16.  I have scheduled a follow up with Dr. Andrey Campanile. Medication risks, benefits and therapeutic alternatives were  reviewed with the patient.  He verbalizes understanding.  Warning signs that warrant immediate attention were discussed.    On POD#10 the patient was tolerating a diet, having BMs, ambulating, VSS, afebrile and felt stable for discharge home.   Physical Exam: General appearance: alert and oriented. Calm and cooperative No acute distress. VSS. Afebrile.  Resp: clear to auscultation bilaterally  Cardio: S1S1 RRR without murmurs or gallops. No edema. GI: soft round and nontender. Serosanguinous drainge.  Wound was partially opened and a small hematoma was evacuated, packed with NS guaze.   Pulses: +2 bilateral distal pulses without cyanosis  Neurologic: Mental status: Alert, oriented, thought content appropriate  Psychiatric: calm and cooperative   Discharge Instructions     Medication List    STOP taking these medications        diphenhydrAMINE 25 MG tablet  Commonly known as:  BENADRYL     pseudoephedrine-acetaminophen 30-500 MG Tabs  Commonly known as:  TYLENOL SINUS     rosuvastatin 10 MG tablet  Commonly known as:  CRESTOR      TAKE these medications        acetaminophen 325 MG tablet  Commonly known as:  TYLENOL  Take 2 tablets (650 mg total) by mouth every 6 (six) hours as needed for mild pain, moderate pain, fever or headache.     aspirin EC 81 MG tablet  Take 81 mg by mouth daily.     cyclobenzaprine 10 MG tablet  Commonly known as:  FLEXERIL  Take 10 mg by mouth daily as needed for muscle spasms.     ferrous sulfate 325 (65 FE) MG tablet  You can resume this after your post op visit as needed.     gabapentin 400 MG capsule  Commonly known as:  NEURONTIN  Take 1,200 mg by mouth 3 (three) times daily.     glyBURIDE 2.5 MG tablet  Commonly known as:  DIABETA  Take 1 tablet (2.5 mg total) by mouth daily with breakfast.     levothyroxine 50 MCG tablet  Commonly known as:  SYNTHROID, LEVOTHROID  Take 50 mcg by mouth daily before breakfast.     lisinopril  20 MG tablet  Commonly known as:  PRINIVIL,ZESTRIL  Resume one daily for systolic blood pressure 120 or greater.  If less call PCP and ask for recommendations.     multivitamin with minerals Tabs tablet  Take 1 tablet by mouth daily.     nabumetone 750 MG tablet  Commonly known as:  RELAFEN  Take 750 mg by mouth 2 (two) times daily.     omeprazole 20 MG tablet  Commonly known as:  PRILOSEC OTC  Take 20 mg by mouth every other day.     oxyCODONE-acetaminophen 5-325 MG per tablet  Commonly known as:  PERCOCET/ROXICET  Take 1-2 tablets by mouth every 4 (four) hours as needed.     Polyethylene Glycol 400 0.25 %  Soln  Apply 1 drop to eye 2 (two) times daily as needed (dry eyes).     Rivaroxaban 15 & 20 MG Tbpk  Commonly known as:  XARELTO STARTER PACK  Take as directed on package: Start with one 15mg  tablet by mouth twice a day with food. On Day 22, switch to one 20mg  tablet once a day with food.     tamsulosin 0.4 MG Caps capsule  Commonly known as:  FLOMAX  Take 1 capsule (0.4 mg total) by mouth daily.           Follow-up Information    Follow up with Atilano InaWILSON,ERIC M, MD. Go on 04/16/2015.   Specialty:  General Surgery   Why:  arrive by 2:15PM for a post op check   Contact information:   [address] [place] [address] [phone]       Follow up with Avelino LeedsKOSSOVER, STUART, MD. Schedule an appointment as soon as possible for a visit in 2 weeks.   Specialty:  Unknown Physician Specialty   Why:  For Medical management of issues not related to surgery.  Let them see you post op in 2-3 weeks also.     Contact information:   [address] [place] [address] [phone]       Follow up with Melvia Heapsobert Kaplan, MD On 04/19/2015.   Specialty:  Gastroenterology   Why:  appt 10:45, be there at 10:30   Contact information:   [address] [place] [address] [phone]        The results of significant diagnostics from this hospitalization  (including imaging, microbiology, ancillary and laboratory) are listed below for reference.    Significant Diagnostic Studies: Dg Abd 1 View  03/21/2015   CLINICAL DATA:  Abdominal pain. Recent hospital admission, discharge yesterday morning post bowel obstruction. Increasing abdominal pain since hospital discharge.  EXAM: ABDOMEN - 1 VIEW  COMPARISON:  Radiographs 03/18/2015  FINDINGS: Progressive small bowel dilatation in the central abdomen compared to prior exam. Enteric chain sutures noted in the right mid abdomen. Air seen throughout the colon. No evidence of free intra-abdominal air or pneumatosis.  IMPRESSION: Progressive gaseous distention of small bowel, concerning for recurrent small bowel obstruction.   Electronically Signed   By: Rubye OaksMelanie  Ehinger M.D.   On: 03/21/2015 02:25   Dg Abd 1 View  03/17/2015   CLINICAL DATA:  Follow-up small bowel obstruction. Feeling better. Still mid abdominal pain and distention.  EXAM: ABDOMEN - 1 VIEW  COMPARISON:  03/16/2015  FINDINGS: Continued mild gaseous distention of scattered small bowel loops, best seen in the left abdomen. Gas and contrast material seen within the colon. No real change since prior study in the bowel gas pattern. No free air organomegaly.  IMPRESSION: Continued mild small bowel distention, likely reflecting small bowel obstruction. No real change.   Electronically Signed   By: Charlett NoseKevin  Dover M.D.   On: 03/17/2015 09:50   Ct Angio Chest Pe W/cm &/or Wo Cm  03/31/2015   CLINICAL DATA:  Shortness of Breath.  Fatigue.  EXAM: CT ANGIOGRAPHY CHEST WITH CONTRAST  TECHNIQUE: Multidetector CT imaging of the chest was performed using the standard protocol during bolus administration of intravenous contrast. Multiplanar CT image reconstructions and MIPs were obtained to evaluate the vascular anatomy.  CONTRAST:  100mL OMNIPAQUE IOHEXOL 350 MG/ML SOLN  COMPARISON:  Multiple exams, including 05/20/2013 and 03/31/2015  FINDINGS: Mediastinum/Nodes: Large  right saddle embolus with extensive clot in the right  upper lobe, right middle lobe, and right lower lobe pulmonary arteries. Moderate left-sided pulmonary embolus involving the upper and lower lobes. Overall clot burden is very high. Right ventricular to left ventricular ratio 2.1, prominently elevated, with some flattening of the interventricular septum indicating elevated right heart pressures.  Lungs/Pleura: Ground-glass density posterior basal segment right lower lobe suspicious for pulmonary hemorrhage. Subsegmental atelectasis in the left lower lobe. Trace right pleural effusion.  Upper abdomen: Punctate calcification in the right hepatic lobe probably from remote inflammation. High density in the gallbladder suggesting sludge.  Musculoskeletal: Thoracic spondylosis.  Review of the MIP images confirms the above findings.  IMPRESSION: 1. Large right saddle embolus and large left-sided pulmonary emboli involving all lobes. High clot burden with elevated right ventricular to left ventricular ratio and elevated right heart pressures. 2. Suspected pulmonary hemorrhage in the right lower lobe. Trace right pleural effusion.  Critical Value/emergent results were called by telephone at the time of interpretation on 03/31/2015 at 11:07 am to [facility], NP , who verbally acknowledged these results.   Electronically Signed   By: Gaylyn Rong M.D.   On: 03/31/2015 11:13   US Abdomen Complete  03/31/2015   CLINICAL DATA:  Elevated LFTs  EXAM: ULTRASOUND ABDOMEN COMPLETE  COMPARISON:  CT abdomen pelvis dated 03/15/2015  FINDINGS: Gallbladder: Gallbladder sludge. No gallbladder wall thickening or pericholecystic fluid. Negative sonographic Murphy's sign.  Common bile duct: Diameter: 3 mm  Liver: No focal lesion identified. Within normal limits in parenchymal echogenicity.  IVC: No abnormality visualized.  Pancreas: Poorly visualized.  Spleen: Size and appearance within normal limits.  Right Kidney: Length: 11.9  cm.  No mass or hydronephrosis.  Left Kidney: Length: 12.8 cm.  No mass or hydronephrosis.  Abdominal aorta: No aneurysm visualized.  Other findings: None.  IMPRESSION: Gallbladder sludge. No sonographic findings to suggest acute cholecystitis.  Otherwise negative abdominal ultrasound.   Electronically Signed   By: Charline Bills M.D.   On: 03/31/2015 11:07   Ct Abdomen Pelvis W Contrast  03/15/2015   CLINICAL DATA:  Abdominal distention and upper abdominal pain.  EXAM: CT ABDOMEN AND PELVIS WITH CONTRAST  TECHNIQUE: Multidetector CT imaging of the abdomen and pelvis was performed using the standard protocol following bolus administration of intravenous contrast.  CONTRAST:  50mL OMNIPAQUE IOHEXOL 300 MG/ML SOLN, OMNIPAQUE IOHEXOL 300 MG/ML SOLN  COMPARISON:  None.  FINDINGS: BODY WALL: Fatty periumbilical hernia. There is a left inguinal hernia containing fat and ascitic fluid.  LOWER CHEST: No contributory findings.  ABDOMEN/PELVIS:  Liver: No focal abnormality.  Biliary: No evidence of biliary obstruction or stone.  Pancreas: Unremarkable.  Spleen: Unremarkable.  Adrenals: Unremarkable.  Kidneys and ureters: No hydronephrosis or stone. Small renal sinus cysts bilaterally.  Bladder: Unremarkable.  Reproductive: No pathologic findings.  Bowel: There is bowel distention and fluid levels leading to the deep left abdomen where there is transition point to decompressed ileum. 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 No evidence of bowel necrosis or perforation. Suspect appendectomy. No pericecal inflammation. Mild scattered colonic diverticulosis.  Retroperitoneum: No mass or adenopathy.  Peritoneum: Small reactive pelvic ascites.  Vascular: No acute abnormality.  OSSEOUS: No acute abnormalities.  IMPRESSION: 1. Distal small bowel obstruction near an enteroenteric anastomosis. 2. Periumbilical and left inguinal fatty  hernias.   Electronically Signed   By: Marnee Spring M.D.   On: 03/15/2015 02:23   Dg Chest [facility] [address]  03/31/2015   CLINICAL DATA:  Right lower chest pain for 2-3 days.  EXAM: PORTABLE CHEST - 1 VIEW  COMPARISON:  11/11/2012  FINDINGS: The cardiomediastinal silhouette is within normal limits. The lungs are well inflated and clear. There is no evidence of pleural effusion or pneumothorax. Lower cervical anterior fusion hardware is partially visualized.  IMPRESSION: No active disease.   Electronically Signed   By: Sebastian Ache   On: 03/31/2015 08:50   Dg Abd 2 Views  03/22/2015   CLINICAL DATA:  Evaluate small bowel obstruction  EXAM: ABDOMEN - 2 VIEW  COMPARISON:  03/21/2015; 03/18/2015; 03/17/2015; 08/04/2012; CT abdomen pelvis - 03/15/2015  FINDINGS: Post enteric tube placement with tip and side port projected over the gastric fundus.  Interval decrease in previously noted marked gas distention of multiple loops of small bowel with mildly dilated slightly amorphous appearing loop of small bowel within the left hand mid hemi abdomen now measuring approximately 4.1 cm in diameter, previously, 4.7 cm. Gas is seen within the splenic flexure and descending colon. No pneumoperitoneum, pneumatosis or portal venous gas.  Enteric suture line seen within the left mid hemi abdomen.  Limited visualization of lower thorax is normal.  Unchanged peripherally corticated ossicle adjacent to the left greater trochanter. Regional osseous structures appear otherwise normal.  IMPRESSION: Post enteric tube placement with findings suggestive of improved / resolved small-bowel obstruction.   Electronically Signed   By: Simonne Come M.D.   On: 03/22/2015 08:04   Dg Abd 2 Views  03/18/2015   CLINICAL DATA:  69 year old male with recent small bowel obstruction. Symptoms improving. Initial encounter.  EXAM: ABDOMEN - 2 VIEW  COMPARISON:  03/17/2015 and earlier. CT Abdomen and Pelvis 03/15/2015.  FINDINGS: Upright and supine views  of the abdomen. Enteric tube seen several days ago is been removed. Grossly negative lung bases. No free air. Staple line re - identified in the upper abdomen just to the left of midline related to the small bowel to small bowel anastomosis demonstrated by CT. The gas pattern has improved, but not completely normalized with residual small bowel loops measuring 3.5-4 cm diameter with air in fluid levels. Right colonic gas has improved since 03/15/2015. No acute osseous abnormality identified.  IMPRESSION: Improved but not completely resolved small bowel obstruction pattern. No free air.   Electronically Signed   By: Odessa Fleming M.D.   On: 03/18/2015 09:25   Dg Abd 2 Views  03/16/2015   CLINICAL DATA:  Small bowel obstruction.  EXAM: ABDOMEN - 2 VIEW  COMPARISON:  03/15/2015  FINDINGS: Nasogastric tube is present with tip over the stomach in the left upper quadrant with side port just below the edge soft should junction in the left upper quadrant. There is interval improvement with overall less number and dilatation of small bowel loops. There are a few air-fluid levels. There are surgical sutures over the midline and left mid abdomen as well as the right lower quadrant. No free peritoneal air. Mild air and contrast throughout the colon. Contrast within the bladder. Remainder of the exam is unchanged.  IMPRESSION: Mild interval improvement in patient's small bowel obstruction with overall less number and dilatation of small bowel loops. No free peritoneal air.  Nasogastric tube with tip and side-port over the stomach in the left upper quadrant.   Electronically Signed   By: Elberta Fortis M.D.   On: 03/16/2015 08:35   Dg Abd Portable 1v-small Bowel Obstruction Protocol-initial, 8 Hr Delay  03/15/2015  CLINICAL DATA:  8 hour delayed film for small bowel obstruction.  EXAM: PORTABLE ABDOMEN - 1 VIEW  COMPARISON:  03/15/2015 abdominal radiograph  FINDINGS: Re- demonstrated marked gaseous distention of multiple small  bowel loops. Decrease colonic gas identified. Small amount of oral contrast material is demonstrated projecting over the left and right lower quadrant. Given the appearance on current radiograph, contrast as not definitely appear to be located within the colon.  IMPRESSION: Contrast demonstrated within bowel within the right lower and left lower quadrant. Given the marked gaseous distension of the small bowel, it is difficult to determine the exact location however this is favored to be located within small bowel.  Persistent small bowel obstruction.   Electronically Signed   By: Annia Belt M.D.   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 Complains of nausea and vomiting. Mid abdominal pain  EXAM: PORTABLE ABDOMEN - 1 VIEW  COMPARISON:  None.  FINDINGS: The nasogastric tube tip is in the stomach. Again noted is gaseous distension of the small bowel loops. Decrease colonic gas noted. A small amount of the enteric contrast material can be seen in the right upper quadrant of the abdomen. It is unclear whether not this is enlarged or small bowel loops. No free intraperitoneal air identified.  IMPRESSION: 1. Persistent small bowel dilatation compatible with obstruction. 2. Difficult to localize enteric contrast material. This may be within the right upper quadrant of the abdomen either in large or small bowel loops.   Electronically Signed   By: Signa Kell M.D.   On: 03/15/2015 08:45    Microbiology: Recent Results (from the past 240 hour(s))  Culture, blood (routine x 2)     Status: None   Collection Time: 03/23/15 10:30 PM  Result Value Ref Range Status   Specimen Description BLOOD LEFT ARM  Final   Special Requests BOTTLES DRAWN AEROBIC AND ANAEROBIC 5CC  Final   Culture   Final    NO GROWTH 5 DAYS Performed at [facility]    Report Status 03/30/2015 FINAL  Final  Culture, blood (routine x  2)     Status: None   Collection Time: 03/23/15 10:35 PM  Result Value Ref Range Status   Specimen Description BLOOD LEFT HAND  Final   Special Requests BOTTLES DRAWN AEROBIC AND ANAEROBIC 2CC  Final   Culture   Final    NO GROWTH 5 DAYS Performed at [facility]    Report Status 03/30/2015 FINAL  Final  Culture, Urine     Status: None   Collection Time: 03/29/15 10:00 AM  Result Value Ref Range Status   Specimen Description URINE, RANDOM  Final   Special Requests Normal  Final   Colony Count NO GROWTH Performed at [facility]   Final   Culture NO GROWTH Performed at [facility]   Final   Report Status 03/30/2015 FINAL  Final     Labs: Basic Metabolic Panel:  Recent Labs Lab 03/27/15 0534 03/29/15 0911 03/30/15 0847 03/31/15 0525 04/02/15 0405  NA 135 137 135 138 136  K 3.9 3.8 3.8 3.8 3.6  CL 105 104 105 106 104  CO2 GLUCOSE 108* 135* 136* 137* 118*  BUN CREATININE 0.94 1.01 0.92 0.96 1.04  CALCIUM 8.3* 8.7 8.7 8.8 8.5   Liver Function Tests:  Recent Labs Lab 03/29/15 0911 03/30/15 0847 03/31/15 0525 04/01/15 1013 04/02/15 0405  AST 89* 72* 60* 50* 64*  ALT 150* 138* 123* 110* 116*  ALKPHOS 230* 221* 195* 189* 200*  BILITOT 1.5* 1.4* 1.1 1.1 1.1  PROT 6.7 6.8 6.7 6.6 6.1  ALBUMIN 3.0* 3.1* 2.9* 3.1* 2.9*   No results for input(s): LIPASE, AMYLASE in the last 168 hours. No results for input(s): AMMONIA in the last 168 hours. CBC:  Recent Labs Lab 03/27/15 0534 03/29/15 0911 03/31/15 0845 04/01/15 0225 04/02/15 0405  WBC 4.9 5.5 8.6 6.5 6.3  HGB 10.6* 11.1* 11.6* 11.0* 10.5*  HCT 31.7* 32.8* 35.3* 33.3* 31.5*  MCV 94.3 93.4 94.6 94.6 94.6  PLT 296 425* 493* 501* 494*   Cardiac Enzymes:  Recent Labs Lab 03/31/15 0845 03/31/15 1505 03/31/15 2051  CKTOTAL 43  --   --   CKMB 2.1  --   --   TROPONINI 0.07* 0.14* 0.12*   BNP: BNP (last 3 results) No results for input(s): BNP in  the last 8760 hours.  ProBNP (last 3 results) No results for input(s): PROBNP in the last 8760 hours.  CBG:  Recent Labs Lab 04/01/15 0755 04/01/15 1200 04/01/15 1715 04/01/15 2201 04/02/15 0756  GLUCAP 136* 133* 104* 96 128*    Principal Problem:   Recurrent SBO (small bowel obstruction) Active Problems:   Bilateral inguinal hernia (BIH), Left > Right   Incisional hernia, without obstruction or gangrene   Diabetes mellitus, type II   Elevated LFTs   Abnormal liver function tests   Time coordinating discharge: <30 mins  Signed:  Trino Higinbotham, ANP-BC

## 2015-04-06 ENCOUNTER — Telehealth: Payer: Self-pay

## 2015-04-06 NOTE — Telephone Encounter (Signed)
-----   Message from Lupita Leashouglas B McQuaid, MD sent at 04/06/2015  3:55 PM EDT ----- tammy ----- Message -----    From: Velvet BatheAshley L Larue Lightner, CMA    Sent: 04/02/2015   5:31 PM      To: Lupita Leashouglas B McQuaid, MD  [place].  Ok to double book or schedule with Tammy?   ----- Message -----    From: Lupita Leashouglas B McQuaid, MD    Sent: 04/02/2015  11:46 AM      To: Velvet BatheAshley L Johncarlos Holtsclaw, CMA  A,  Please call next week and schedule HFU with me in 2-4 weeks for PE.  Thanks B

## 2015-04-06 NOTE — Telephone Encounter (Signed)
Pt scheduled for 5/16 with TP.  Nothing further needed.

## 2015-04-19 ENCOUNTER — Other Ambulatory Visit (INDEPENDENT_AMBULATORY_CARE_PROVIDER_SITE_OTHER): Payer: Medicare Other

## 2015-04-19 ENCOUNTER — Encounter: Payer: Self-pay | Admitting: Gastroenterology

## 2015-04-19 ENCOUNTER — Encounter: Payer: Self-pay | Admitting: Adult Health

## 2015-04-19 ENCOUNTER — Ambulatory Visit (INDEPENDENT_AMBULATORY_CARE_PROVIDER_SITE_OTHER): Payer: Medicare Other | Admitting: Gastroenterology

## 2015-04-19 ENCOUNTER — Ambulatory Visit (INDEPENDENT_AMBULATORY_CARE_PROVIDER_SITE_OTHER): Payer: Medicare Other | Admitting: Adult Health

## 2015-04-19 VITALS — BP 134/78 | HR 88 | Ht 71.0 in | Wt 182.4 lb

## 2015-04-19 VITALS — BP 130/70 | HR 99 | Temp 97.4°F | Wt 185.8 lb

## 2015-04-19 DIAGNOSIS — I2699 Other pulmonary embolism without acute cor pulmonale: Secondary | ICD-10-CM

## 2015-04-19 DIAGNOSIS — R945 Abnormal results of liver function studies: Principal | ICD-10-CM

## 2015-04-19 DIAGNOSIS — D509 Iron deficiency anemia, unspecified: Secondary | ICD-10-CM | POA: Diagnosis not present

## 2015-04-19 DIAGNOSIS — R7989 Other specified abnormal findings of blood chemistry: Secondary | ICD-10-CM | POA: Diagnosis not present

## 2015-04-19 DIAGNOSIS — R21 Rash and other nonspecific skin eruption: Secondary | ICD-10-CM

## 2015-04-19 LAB — HEPATIC FUNCTION PANEL
ALBUMIN: 3.9 g/dL (ref 3.5–5.2)
ALK PHOS: 75 U/L (ref 39–117)
ALT: 18 U/L (ref 0–53)
AST: 15 U/L (ref 0–37)
BILIRUBIN DIRECT: 0.1 mg/dL (ref 0.0–0.3)
Total Bilirubin: 0.7 mg/dL (ref 0.2–1.2)
Total Protein: 6.9 g/dL (ref 6.0–8.3)

## 2015-04-19 LAB — IBC PANEL
Iron: 100 ug/dL (ref 42–165)
SATURATION RATIOS: 30.3 % (ref 20.0–50.0)
TRANSFERRIN: 236 mg/dL (ref 212.0–360.0)

## 2015-04-19 LAB — FERRITIN: FERRITIN: 102.6 ng/mL (ref 22.0–322.0)

## 2015-04-19 MED ORDER — RIVAROXABAN 20 MG PO TABS: 20.0000 mg | ORAL_TABLET | Freq: Every day | ORAL | Status: DC

## 2015-04-19 NOTE — Progress Notes (Signed)
_                                                                                                                History of Present Illness:  James Barber is returned following hospitalization for smallArnold Barber bowel obstruction requiring surgery, complicated by a pulmonary embolus, for abnormal liver tests.  Following surgery cholestatic liver tests were noted.  The patient has no history of liver disease.  Abdominal ultrasound, which I reviewed, was unrevealing except for some gallbladder sludge.  There is no history of liver disease or alcohol abuse..  Liver test in 2 weeks ago were unchanged.  He has felt well since discharge until yesterday when he developed a pruritic rash over his trunk and lower extremities.   Past Medical History  Diagnosis Date  . Hypertension   . GERD (gastroesophageal reflux disease)     takes  otc every  other day  . Bowel obstruction   . Diabetes mellitus without complication    Past Surgical History  Procedure Laterality Date  . Abdominal surgery    . Back surgery    . Cervical fusion      c 3-4, C 4-5, C5-6  . Hernia repair      right inguinal  . Anterior cervical decomp/discectomy fusion  11/12/2012    Procedure: ANTERIOR CERVICAL DECOMPRESSION/DISCECTOMY FUSION 2 LEVELS;  Surgeon: Barnett AbuHenry Elsner, MD;  Location: [facility];  Service: Neurosurgery;  Laterality: N/A;  Right Side approach Cervical six-seven,Cervical seven -thoracic one Anterior cervical decompression/diskectomy/fusion  . Laparotomy N/A 03/22/2015    Procedure: DIAGONOSTIC LAPAROSCOPY, LAPROSCOPIC LYSIS OF ADHESIONS FOR ONE HOUR, DISTAL SMALL BOWEL RESECTION;  Surgeon: Gaynelle AduEric Wilson, MD;  Location: [facility];  Service: General;  Laterality: N/A;   family history is not on file. Current Outpatient Prescriptions  Medication Sig Dispense Refill  . acetaminophen (TYLENOL) 325 MG tablet Take 2 tablets (650 mg total) by mouth every 6 (six) hours as needed for mild pain, moderate pain, fever or  headache.    Marland Kitchen. aspirin EC 81 MG tablet Take 81 mg by mouth daily.    . cyclobenzaprine (FLEXERIL) 10 MG tablet Take 10 mg by mouth daily as needed for muscle spasms.    . ferrous sulfate 325 (65 FE) MG tablet You can resume this after your post op visit as needed.  3  . gabapentin (NEURONTIN) 400 MG capsule Take 1,200 mg by mouth 3 (three) times daily.     Marland Kitchen. glyBURIDE (DIABETA) 2.5 MG tablet Take 1 tablet (2.5 mg total) by mouth daily with breakfast. 30 tablet 0  . levothyroxine (SYNTHROID, LEVOTHROID) 50 MCG tablet Take 50 mcg by mouth daily before breakfast.    . lisinopril (PRINIVIL,ZESTRIL) 20 MG tablet Resume one daily for systolic blood pressure 120 or greater.  If less call PCP and ask for recommendations.    . Multiple Vitamin (MULTIVITAMIN WITH MINERALS) TABS tablet Take 1 tablet by mouth daily.    . nabumetone (RELAFEN) 750 MG tablet Take 750 mg by  mouth 2 (two) times daily.    Marland Kitchen. omeprazole (PRILOSEC OTC) 20 MG tablet Take 20 mg by mouth every other day.    . oxyCODONE-acetaminophen (PERCOCET/ROXICET) 5-325 MG per tablet Take 1-2 tablets by mouth every 4 (four) hours as needed. (Patient taking differently: Take 1-2 tablets by mouth as needed. ) 40 tablet 0  . Polyethylene Glycol 400 0.25 % SOLN Apply 1 drop to eye 2 (two) times daily as needed (dry eyes).    . Rivaroxaban (XARELTO STARTER PACK) 15 & 20 MG TBPK Take as directed on package: Start with one 15mg  tablet by mouth twice a day with food. On Day 22, switch to one 20mg  tablet once a day with food. 51 each 0  . tamsulosin (FLOMAX) 0.4 MG CAPS capsule Take 1 capsule (0.4 mg total) by mouth daily. 30 capsule 0   No current facility-administered medications for this visit.   Allergies as of 04/19/2015  . (No Known Allergies)    reports that he has never smoked. He does not have any smokeless tobacco history on file. He reports that he does not drink alcohol or use illicit drugs.   Review of Systems: Pertinent positive and  negative review of systems were noted in the above HPI section. All other review of systems were otherwise negative.  Vital signs were reviewed in today's medical record Physical Exam: General: Well developed , well nourished, no acute distress Skin: anicteric.  There are the beginnings of a macular rash over his lower extremities in spots and on his abdomen Head: Normocephalic and atraumatic Eyes:  sclerae anicteric, EOMI Ears: Normal auditory acuity Mouth: No deformity or lesions Neck: Supple, no masses or thyromegaly Lymph Nodes: no lymphadenopathy Lungs: Clear throughout to auscultation Heart: Regular rate and rhythm; no murmurs, rubs or bruits Gastroinestinal: Soft, non tender and non distended. No masses, hepatosplenomegaly or hernias noted. Normal Bowel sounds Rectal:deferred Musculoskeletal: Symmetrical with no gross deformities  Skin: No lesions on visible extremities Pulses:  Normal pulses noted Extremities: No clubbing, cyanosis, edema or deformities noted Neurological: Alert oriented x 4, grossly nonfocal Cervical Nodes:  No significant cervical adenopathy Inguinal Nodes: No significant inguinal adenopathy Psychological:  Alert and cooperative. Normal mood and affect  See Assessment and Plan under Problem List

## 2015-04-19 NOTE — Patient Instructions (Signed)
Go to the basement today for labs  Follow up on 06/30/2015 at 9:15am with Dr Lindell SparKaplan You will need to go to the basement for labs before your appointment

## 2015-04-19 NOTE — Patient Instructions (Signed)
Continue on Xarelto 15mg  Twice daily  As directed , then transition to 20mg  daily .  Prescriptions sent to your pharmacy.  Cool compresses to rash  Hold flomax and glyburide until seen by primary MD on Wednesday  If rash worsens or develops breathing problems or swelling call immediately.  Follow up Dr. Kendrick FriesMcQuaid in 6 weeks and As needed   Please contact office for sooner follow up if symptoms do not improve or worsen or seek emergency care

## 2015-04-19 NOTE — Progress Notes (Signed)
Quick Note:  Please inform the patient that lab work was normal . Liver tests are now normal. He is due for colonoscopy what I would like to wait until he is off anticoagulants. Ask him to contact us when Xarelto is discontinued. ______

## 2015-04-19 NOTE — Assessment & Plan Note (Signed)
?   Etiology  ? Ptyriasis Rosea  follow up with PCP on Wed  Advised if worse call immediately   Plan  Cool compresses to rash  Hold flomax and glyburide until seen by primary MD on Wednesday  If rash worsens or develops breathing problems or swelling call immediately.

## 2015-04-19 NOTE — Assessment & Plan Note (Addendum)
Continue on Xarelto 15mg  Twice daily  As directed , then transition to 20mg  daily .  Plan for 3 months since provoked and first clot  Consider repeat echo in 3 months prior to d/c xarelto Prescriptions sent to your pharmacy.  Follow up Dr. Kendrick FriesMcQuaid in 6 weeks and As needed   Please contact office for sooner follow up if symptoms do not improve or worsen or seek emergency care

## 2015-04-19 NOTE — Progress Notes (Signed)
   Subjective:    Patient ID: James Barber, male    DOB: [birth year], 69 y.o.   MRN: [id number]  HPI 69 year old male seen for initial pulmonary consult for pulmonary embolus   04/19/2015 post hospital follow-up Patient was admitted April 17 through April 29. Patient was initially admitted with abdominal pain, found to have a small bowel obstruction. He underwent a laparoscopic lysis of adhesions, He was also seen by gastroenterology for abnormal LFTs, which he is following with Dr. Arlyce DiceKaplan in the outpatient setting. Unfortunately, patient developed sudden chest pain and shortness of breath CT chest showed a large right sided embolus and large left-sided pulmonary emboli involving all lobes with a high clot burden elevated heart pressures and suspected pulmonary hemorrhage 2-D echo showed mild LVH with an EF of 50%, right ventricle mild dilatation and mild dysfunction with pulmonary artery pressures at 53. Venous Dopplers were negative.  he was treated with heparin and transitioned over to Xarelto Since discharge he is feeling better. Does get winded at times, He complains over the last 2-3 days that he has developed a rash along the back, upper legs and groin area. Complains appear Pruritis .  Denies any tongue swelling difficulty swallowing, lip swelling. He was started on 2 other medications during his hospitalizations including Flomax and glyburide. Denies any new skin products.  Review of Systems Constitutional:   No  weight loss, night sweats,  Fevers, chills, fatigue, or  lassitude.  HEENT:   No headaches,  Difficulty swallowing,  Tooth/dental problems, or  Sore throat,                No sneezing, itching, ear ache, nasal congestion, post nasal drip,   CV:  No chest pain,  Orthopnea, PND, swelling in lower extremities, anasarca, dizziness, palpitations, syncope.   GI  No heartburn, indigestion, abdominal pain, nausea, vomiting, diarrhea, change in bowel habits, loss of  appetite, bloody stools.   Resp: No shortness of breath with exertion or at rest.  No excess mucus, no productive cough,  No non-productive cough,  No coughing up of blood.  No change in color of mucus.  No wheezing.  No chest wall deformity  Skin: + rash   GU: no dysuria, change in color of urine, no urgency or frequency.  No flank pain, no hematuria   MS:  No joint pain or swelling.  No decreased range of motion.  No back pain.  Psych:  No change in mood or affect. No depression or anxiety.  No memory loss.         Objective:   Physical Exam GEN: A/Ox3; pleasant , NAD, well nourished   HEENT:  [place]/AT,  EACs-clear, TMs-wnl, NOSE-clear, THROAT-clear, no lesions, no postnasal drip or exudate noted.   NECK:  Supple w/ fair ROM; no JVD; normal carotid impulses w/o bruits; no thyromegaly or nodules palpated; no lymphadenopathy.  RESP  Clear  P & A; w/o, wheezes/ rales/ or rhonchi.no accessory muscle use, no dullness to percussion  CARD:  RRR, no m/r/g  , no peripheral edema, pulses intact, no cyanosis or clubbing.  GI:   Soft & nt; nml bowel sounds; no organomegaly or masses detected.  Musco: Warm bil, no deformities or joint swelling noted.   Neuro: alert, no focal deficits noted.    Skin: Warm, diffuse papular rash along torso and proximal ext          Assessment & Plan:

## 2015-04-19 NOTE — Assessment & Plan Note (Signed)
Patient has a mild anemia although he is taking iron.  Last colonoscopy 2003 was unrevealing.  Plan follow-up colonoscopy once he is off anticoagulants

## 2015-04-19 NOTE — Assessment & Plan Note (Signed)
Patient has intrahepatic cholestasis of unclear etiology.  Time course is prolonged for postoperative cholestasis.  Drugs may be operative.  There is no evidence for obstruction.  Serologies for hepatitis A, B, and C were negative  Repetitions #1 repeat LFTs, check antimitochondrial antibody, antinuclear antibody, iron, TIBC, ferritin, ceruloplasmin level

## 2015-04-20 LAB — ANA: ANA: NEGATIVE

## 2015-04-20 LAB — MITOCHONDRIAL ANTIBODIES: Mitochondrial M2 Ab, IgG: 0.28 (ref <0.91)

## 2015-04-20 NOTE — Progress Notes (Signed)
I agree with this plan of care 

## 2015-05-31 ENCOUNTER — Ambulatory Visit (INDEPENDENT_AMBULATORY_CARE_PROVIDER_SITE_OTHER): Payer: Medicare Other | Admitting: Pulmonary Disease

## 2015-05-31 ENCOUNTER — Encounter: Payer: Self-pay | Admitting: Pulmonary Disease

## 2015-05-31 VITALS — BP 120/76 | HR 62 | Ht 72.0 in | Wt 184.0 lb

## 2015-05-31 DIAGNOSIS — I2699 Other pulmonary embolism without acute cor pulmonale: Secondary | ICD-10-CM

## 2015-05-31 NOTE — Assessment & Plan Note (Signed)
He had a provoked pulmonary embolism in April 2016 after small bowel obstruction. He has been doing well and has no symptoms at this time. He is tolerating the plug and are without evidence of bleeding.  Plan: Per guideline therapy we will have him complete 3 months total of treatment with Xarelto then stop He was instructed today to call me immediately if he has leg pain or swelling after stopping Xarelto Repeat echocardiogram in 6 months given the RV dilatation seen after his pulmonary embolism Follow-up with me after echocardiogram

## 2015-05-31 NOTE — Progress Notes (Signed)
Subjective:    Patient ID: James Barber, male    DOB: [birth year], 69 y.o.   MRN: [id number]  Synopsis: Had a provoked pulmonary bolus him after small bowel obstruction in April 2016. Mild RV dilatation. HPI Chief Complaint  Patient presents with  . Follow-up    Pt has no breathing complaints at this time.     He has been doing well since the last visit. He has not had any bleeding. No leg pain or swelling. No shortness of breath. No chest tightness. He is back to work and doing well. His bowels are functioning normally.  Past Medical History  Diagnosis Date  . Hypertension   . GERD (gastroesophageal reflux disease)     takes  otc every  other day  . Bowel obstruction   . Diabetes mellitus without complication       Review of Systems     Objective:   Physical Exam Filed Vitals:   05/31/15 1122  BP: 120/76  Pulse: 62  Height: 6' (1.829 m)  Weight: 184 lb (83.462 kg)  SpO2: 98%   RA  Gen: well appearing HENT: OP clear, TM's clear, neck supple PULM: CTA B, normal percussion CV: RRR, no mgr, trace edema GI: BS+, soft, nontender Derm: no cyanosis or rash Psyche: normal mood and affect        Assessment & Plan:  Acute pulmonary embolus He had a provoked pulmonary embolism in April 2016 after small bowel obstruction. He has been doing well and has no symptoms at this time. He is tolerating the plug and are without evidence of bleeding.  Plan: Per guideline therapy we will have him complete 3 months total of treatment with Xarelto then stop He was instructed today to call me immediately if he has leg pain or swelling after stopping Xarelto Repeat echocardiogram in 6 months given the RV dilatation seen after his pulmonary embolism Follow-up with me after echocardiogram    Current outpatient prescriptions:  .  acetaminophen (TYLENOL) 325 MG tablet, Take 2 tablets (650 mg total) by mouth every 6 (six) hours as needed for mild pain, moderate pain, fever or  headache., Disp: , Rfl:  .  aspirin EC 81 MG tablet, Take 81 mg by mouth daily., Disp: , Rfl:  .  cyclobenzaprine (FLEXERIL) 10 MG tablet, Take 10 mg by mouth daily as needed for muscle spasms., Disp: , Rfl:  .  ferrous sulfate 325 (65 FE) MG tablet, You can resume this after your post op visit as needed., Disp: , Rfl: 3 .  gabapentin (NEURONTIN) 400 MG capsule, Take 1,200 mg by mouth 3 (three) times daily. , Disp: , Rfl:  .  levothyroxine (SYNTHROID, LEVOTHROID) 50 MCG tablet, Take 50 mcg by mouth daily before breakfast., Disp: , Rfl:  .  lisinopril (PRINIVIL,ZESTRIL) 20 MG tablet, Resume one daily for systolic blood pressure 120 or greater.  If less call PCP and ask for recommendations., Disp: , Rfl:  .  Multiple Vitamin (MULTIVITAMIN WITH MINERALS) TABS tablet, Take 1 tablet by mouth daily., Disp: , Rfl:  .  nabumetone (RELAFEN) 750 MG tablet, Take 750 mg by mouth 2 (two) times daily., Disp: , Rfl:  .  omeprazole (PRILOSEC OTC) 20 MG tablet, Take 20 mg by mouth every other day., Disp: , Rfl:  .  oxyCODONE-acetaminophen (PERCOCET/ROXICET) 5-325 MG per tablet, Take 1-2 tablets by mouth every 4 (four) hours as needed. (Patient taking differently: Take 1-2 tablets by mouth as needed. ), Disp: 40 tablet,  Rfl: 0 .  Polyethylene Glycol 400 0.25 % SOLN, Apply 1 drop to eye 2 (two) times daily as needed (dry eyes)., Disp: , Rfl:  .  rivaroxaban (XARELTO) 20 MG TABS tablet, Take 1 tablet (20 mg total) by mouth daily with supper., Disp: 30 tablet, Rfl: 2

## 2015-05-31 NOTE — Patient Instructions (Signed)
Take 1 more month of the Xarelto then stop We will order an echocardiogram for October 2016 and see you after that

## 2015-06-29 ENCOUNTER — Other Ambulatory Visit: Payer: Self-pay | Admitting: *Deleted

## 2015-06-29 ENCOUNTER — Telehealth: Payer: Self-pay | Admitting: *Deleted

## 2015-06-29 DIAGNOSIS — R945 Abnormal results of liver function studies: Principal | ICD-10-CM

## 2015-06-29 DIAGNOSIS — R7989 Other specified abnormal findings of blood chemistry: Secondary | ICD-10-CM

## 2015-06-29 NOTE — Telephone Encounter (Signed)
PT AWARE TO COME IN TODAY TO HAVE LFT'S DRAWN BEFORE APPT TOMORROW

## 2015-06-30 ENCOUNTER — Ambulatory Visit (INDEPENDENT_AMBULATORY_CARE_PROVIDER_SITE_OTHER): Payer: Medicare Other | Admitting: Gastroenterology

## 2015-06-30 ENCOUNTER — Encounter: Payer: Self-pay | Admitting: Gastroenterology

## 2015-06-30 ENCOUNTER — Other Ambulatory Visit: Payer: Medicare Other

## 2015-06-30 VITALS — BP 102/60 | HR 88 | Ht 71.0 in | Wt 184.5 lb

## 2015-06-30 DIAGNOSIS — R7989 Other specified abnormal findings of blood chemistry: Secondary | ICD-10-CM

## 2015-06-30 DIAGNOSIS — R945 Abnormal results of liver function studies: Principal | ICD-10-CM

## 2015-06-30 DIAGNOSIS — D509 Iron deficiency anemia, unspecified: Secondary | ICD-10-CM

## 2015-06-30 LAB — HEPATIC FUNCTION PANEL
ALBUMIN: 4.1 g/dL (ref 3.5–5.2)
ALK PHOS: 50 U/L (ref 39–117)
ALT: 11 U/L (ref 0–53)
AST: 13 U/L (ref 0–37)
BILIRUBIN TOTAL: 0.9 mg/dL (ref 0.2–1.2)
Bilirubin, Direct: 0.1 mg/dL (ref 0.0–0.3)
Total Protein: 6.5 g/dL (ref 6.0–8.3)

## 2015-06-30 NOTE — Progress Notes (Signed)
      History of Present Illness:  James Barber has returned for follow-up of abnormal liver tests.  He developed cholestatic liver tests coincident with an acute pulmonary embolus.  Last set of LFTs in May were normal.  Another set is pending today.  He feels well and has no GI or pulmonary complaints.  He is about to complete a three-month course of Xarelto.    Review of Systems: Pertinent positive and negative review of systems were noted in the above HPI section. All other review of systems were otherwise negative.    Current Medications, Allergies, Past Medical History, Past Surgical History, Family History and Social History were reviewed in [facility] electronic medical record  Vital signs were reviewed in today's medical record. Physical Exam: General: Well developed , well nourished, no acute distress Skin: anicteric Head: Normocephalic and atraumatic Eyes:  sclerae anicteric, EOMI Ears: Normal auditory acuity Mouth: No deformity or lesions Lymph Nodes: no lymphadenopathy Lungs: Clear throughout to auscultation Heart: Regular rate and rhythm; no murmurs, rubs or brui: Gastroinestinal:  Soft, non tender and non distended. No masses, hepatosplenomegaly or hernias noted. Normal Bowel sounds Rectal:deferred Musculoskeletal: Symmetrical with no gross deformities  Pulses:  Normal pulses noted Extremities: No clubbing, cyanosis, edema or deformities noted Neurological: Alert oriented x 4, grossly nonfocal Psychological:  Alert and cooperative. Normal mood and affect  See Assessment and Plan under Problem List

## 2015-06-30 NOTE — Patient Instructions (Signed)
Follow up as needed

## 2015-06-30 NOTE — Assessment & Plan Note (Signed)
Patient takes supplementary iron.  He is due for follow-up colonoscopy.  He is due to stop Xarelto.  Will schedule in the next 3-6 months

## 2015-06-30 NOTE — Assessment & Plan Note (Signed)
LFT abnormalities have resolved as per his last set of liver tests in May.  I anticipate today's results will also be normal.  I would attribute LFT abnormalities to his pulmonary embolus.  Prior serologies were negative for any type of underlying liver disease.  Recommendations #1 provided today's LFTs are normal no further follow-up is required

## 2015-07-01 NOTE — Progress Notes (Signed)
Quick Note:  Please inform the patient that lab work was normal. No further GI follow-up ______

## 2015-09-06 ENCOUNTER — Other Ambulatory Visit (HOSPITAL_COMMUNITY): Payer: Medicare Other

## 2015-09-08 ENCOUNTER — Other Ambulatory Visit: Payer: Self-pay

## 2015-09-08 ENCOUNTER — Ambulatory Visit (HOSPITAL_COMMUNITY): Payer: Medicare Other | Attending: Cardiology

## 2015-09-08 DIAGNOSIS — I517 Cardiomegaly: Secondary | ICD-10-CM | POA: Diagnosis not present

## 2015-09-08 DIAGNOSIS — I2699 Other pulmonary embolism without acute cor pulmonale: Secondary | ICD-10-CM | POA: Insufficient documentation

## 2015-09-08 DIAGNOSIS — E119 Type 2 diabetes mellitus without complications: Secondary | ICD-10-CM | POA: Diagnosis not present

## 2015-09-08 DIAGNOSIS — E785 Hyperlipidemia, unspecified: Secondary | ICD-10-CM | POA: Diagnosis not present

## 2016-04-12 ENCOUNTER — Encounter: Payer: Self-pay | Admitting: Pulmonary Disease

## 2017-01-02 IMAGING — CR DG ABDOMEN 2V
2 series · 2 of 2 positions shown · non-contrast
Comparison: 03/17/2015 and earlier. CT Abdomen and Pelvis
03/15/2015.

CLINICAL DATA: 68-year-old male with recent small bowel
obstruction. Symptoms improving. Initial encounter.

EXAM:
ABDOMEN - 2 VIEW

[w abdomen upright]
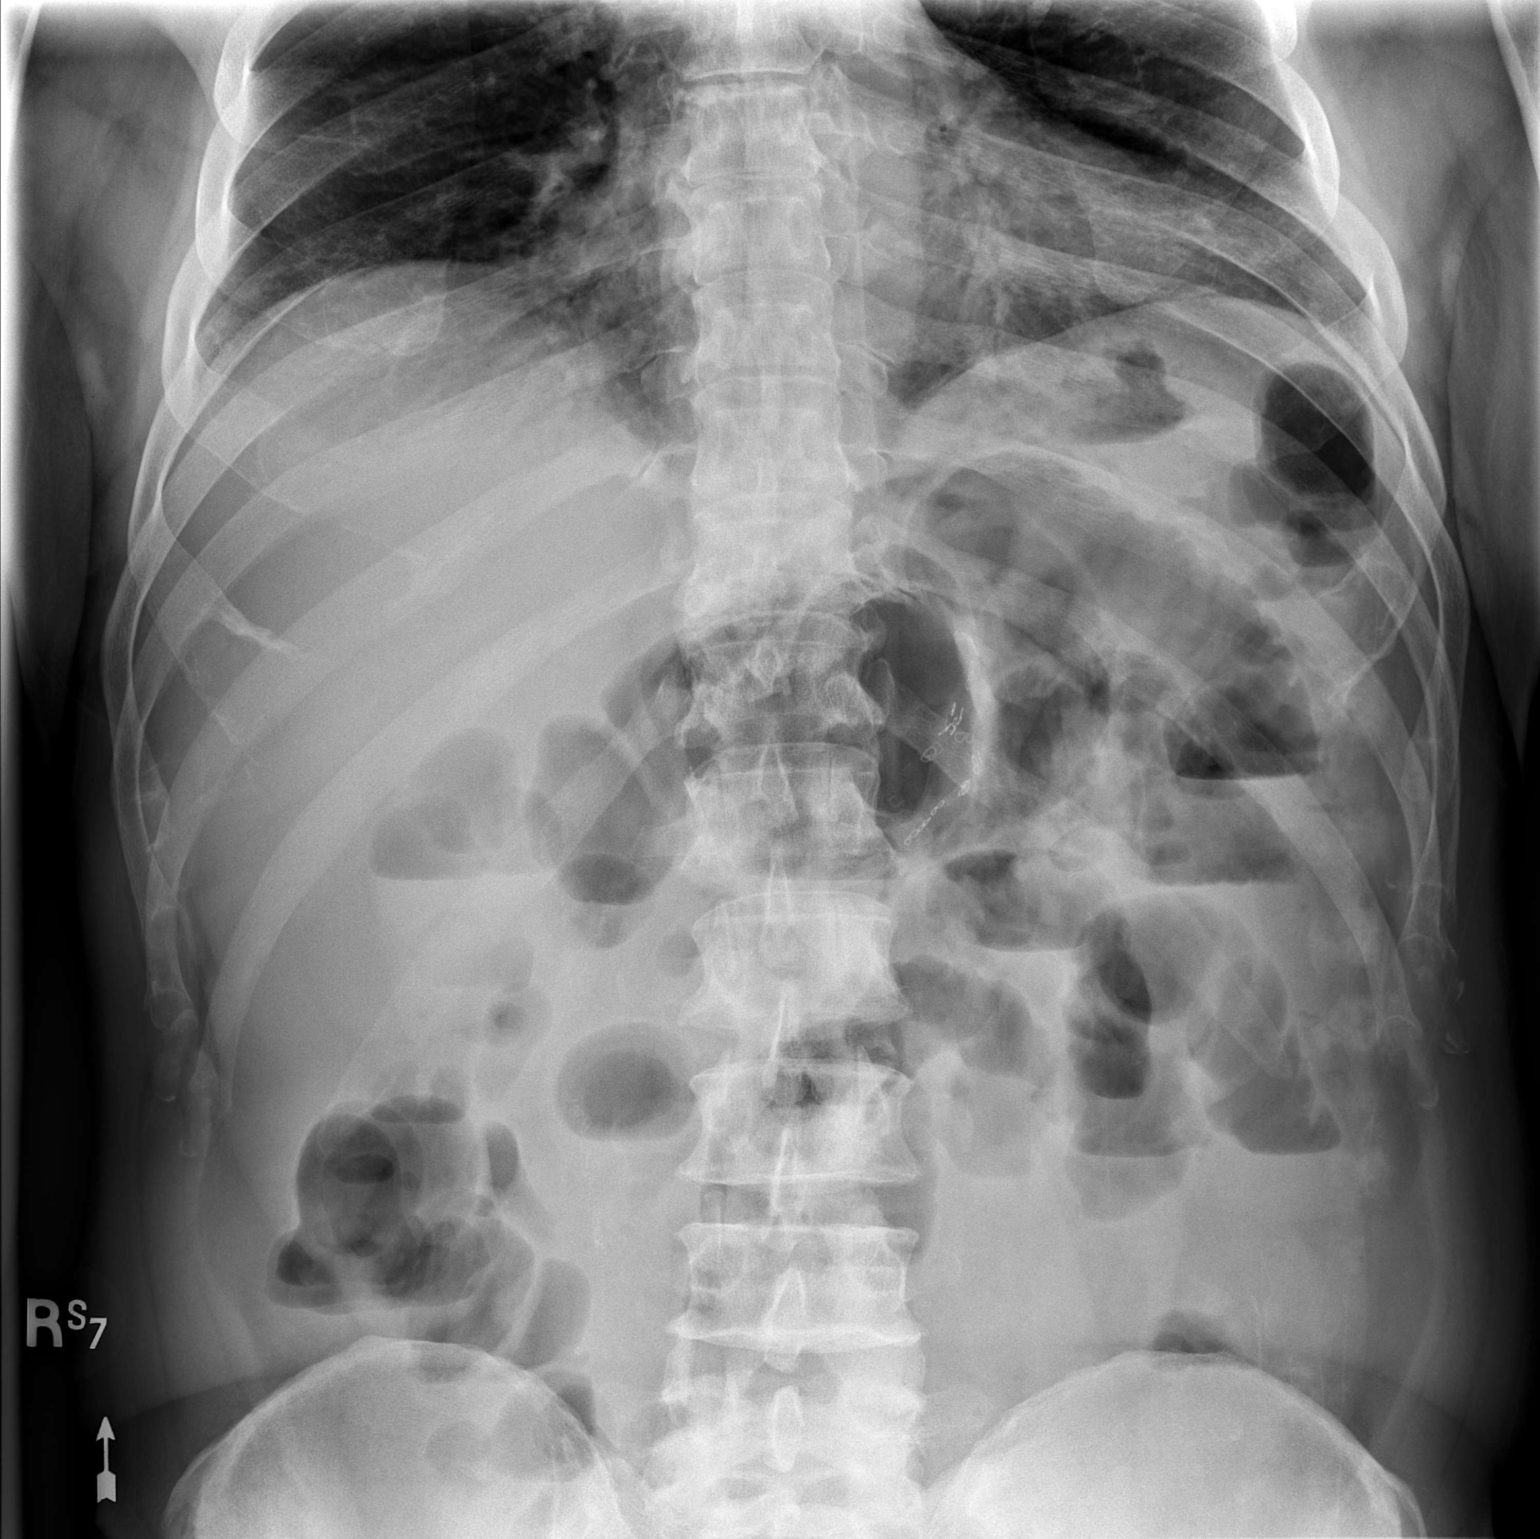

[t abdomen supine]
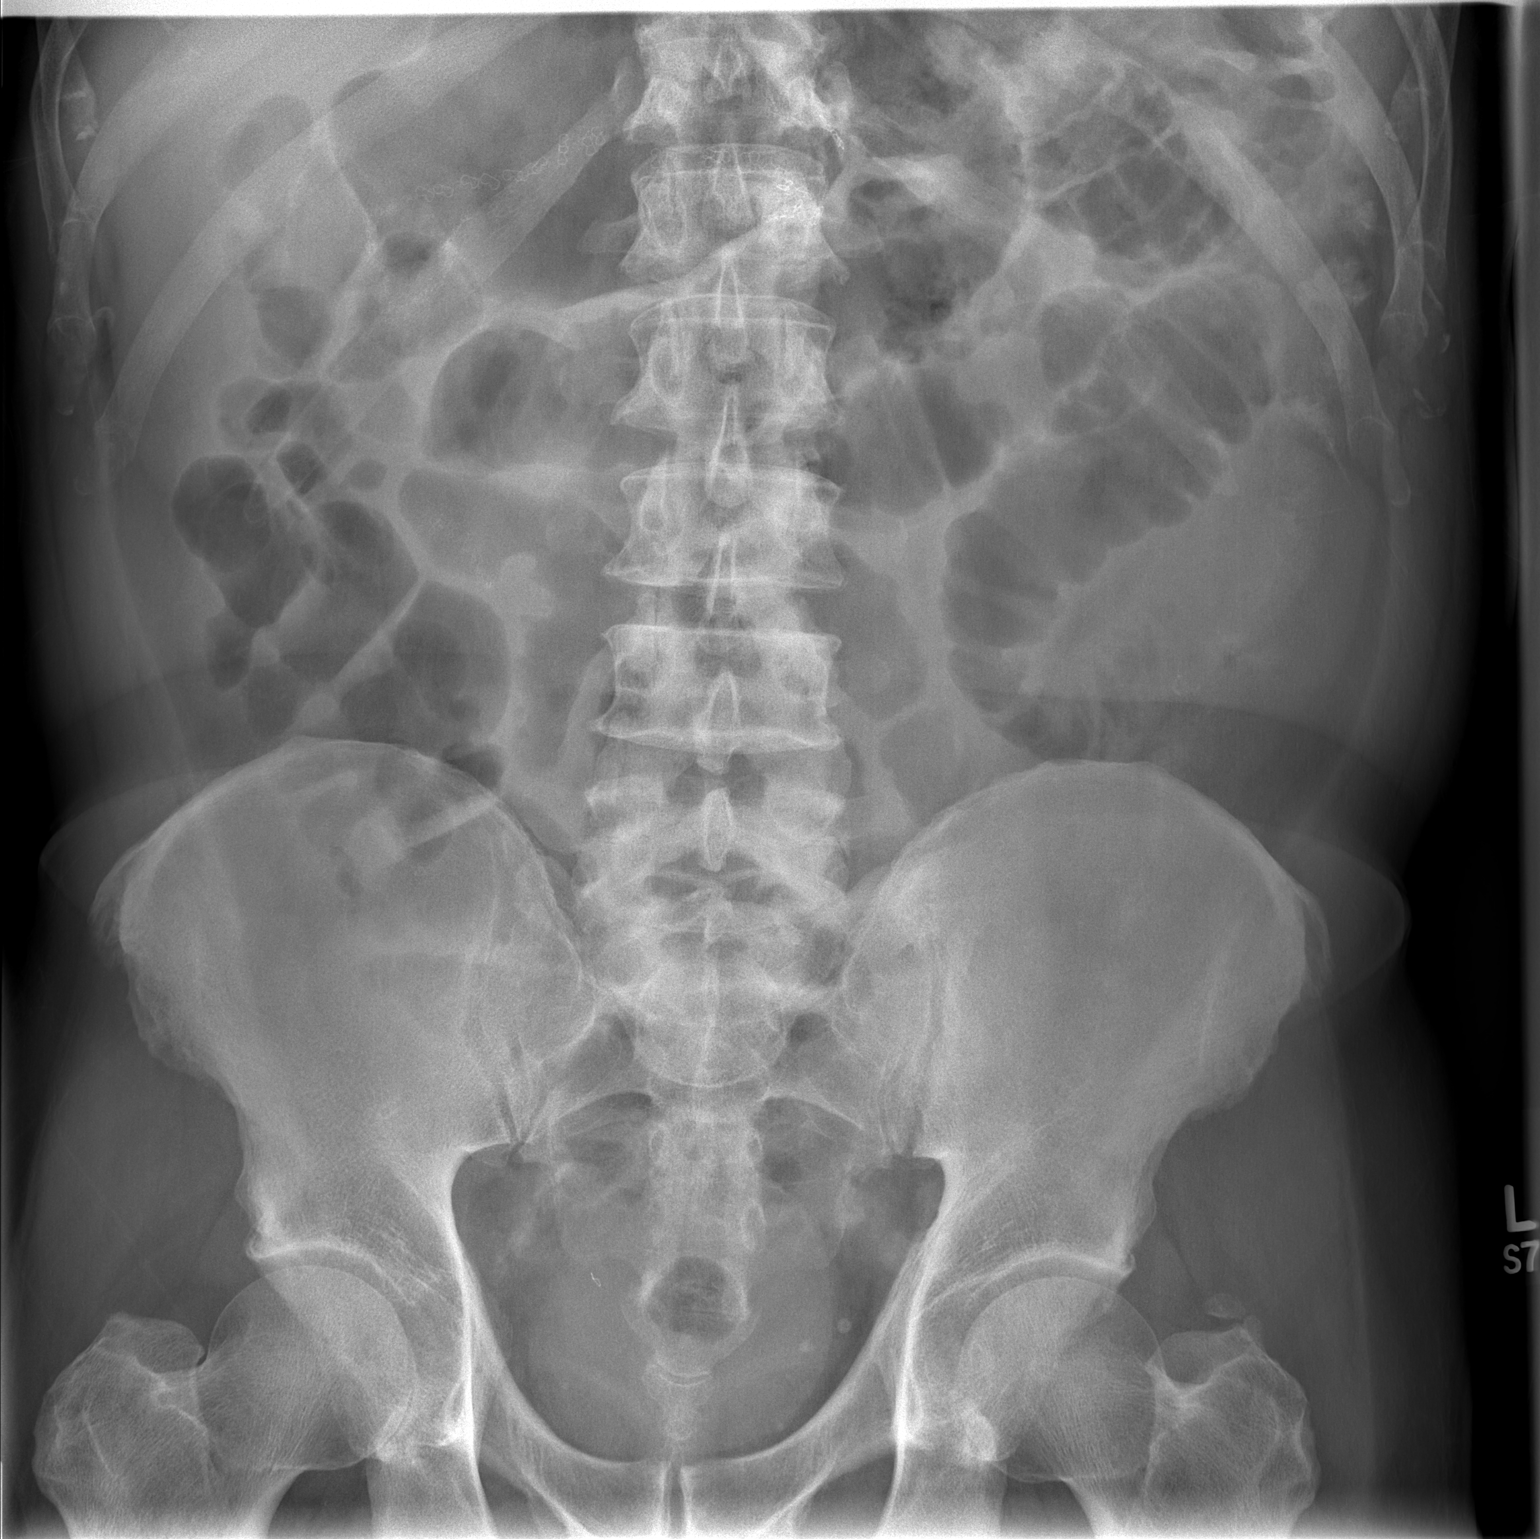

[2 of 2 positions shown; findings below may reference images not displayed]

FINDINGS: Upright and supine views of the abdomen. Enteric tube seen several
days ago is been removed. Grossly negative lung bases. No free air.
Staple line re - identified in the upper abdomen just to the left of
midline related to the small bowel to small bowel anastomosis
demonstrated by CT. The gas pattern has improved, but not completely
normalized with residual small bowel loops measuring 3.5-4 cm
diameter with air in fluid levels. Right colonic gas has improved
since 03/15/2015. No acute osseous abnormality identified.
IMPRESSION: Improved but not completely resolved small bowel obstruction
pattern. No free air.

## 2017-01-05 IMAGING — DX DG ABDOMEN 1V
2 series · 2 of 2 positions shown · non-contrast
Comparison: Radiographs 03/18/2015

CLINICAL DATA: Abdominal pain. Recent hospital admission, discharge
yesterday morning post bowel obstruction. Increasing abdominal pain
since hospital discharge.

EXAM:
ABDOMEN - 1 VIEW

[abdomen supine (1 of 2)]
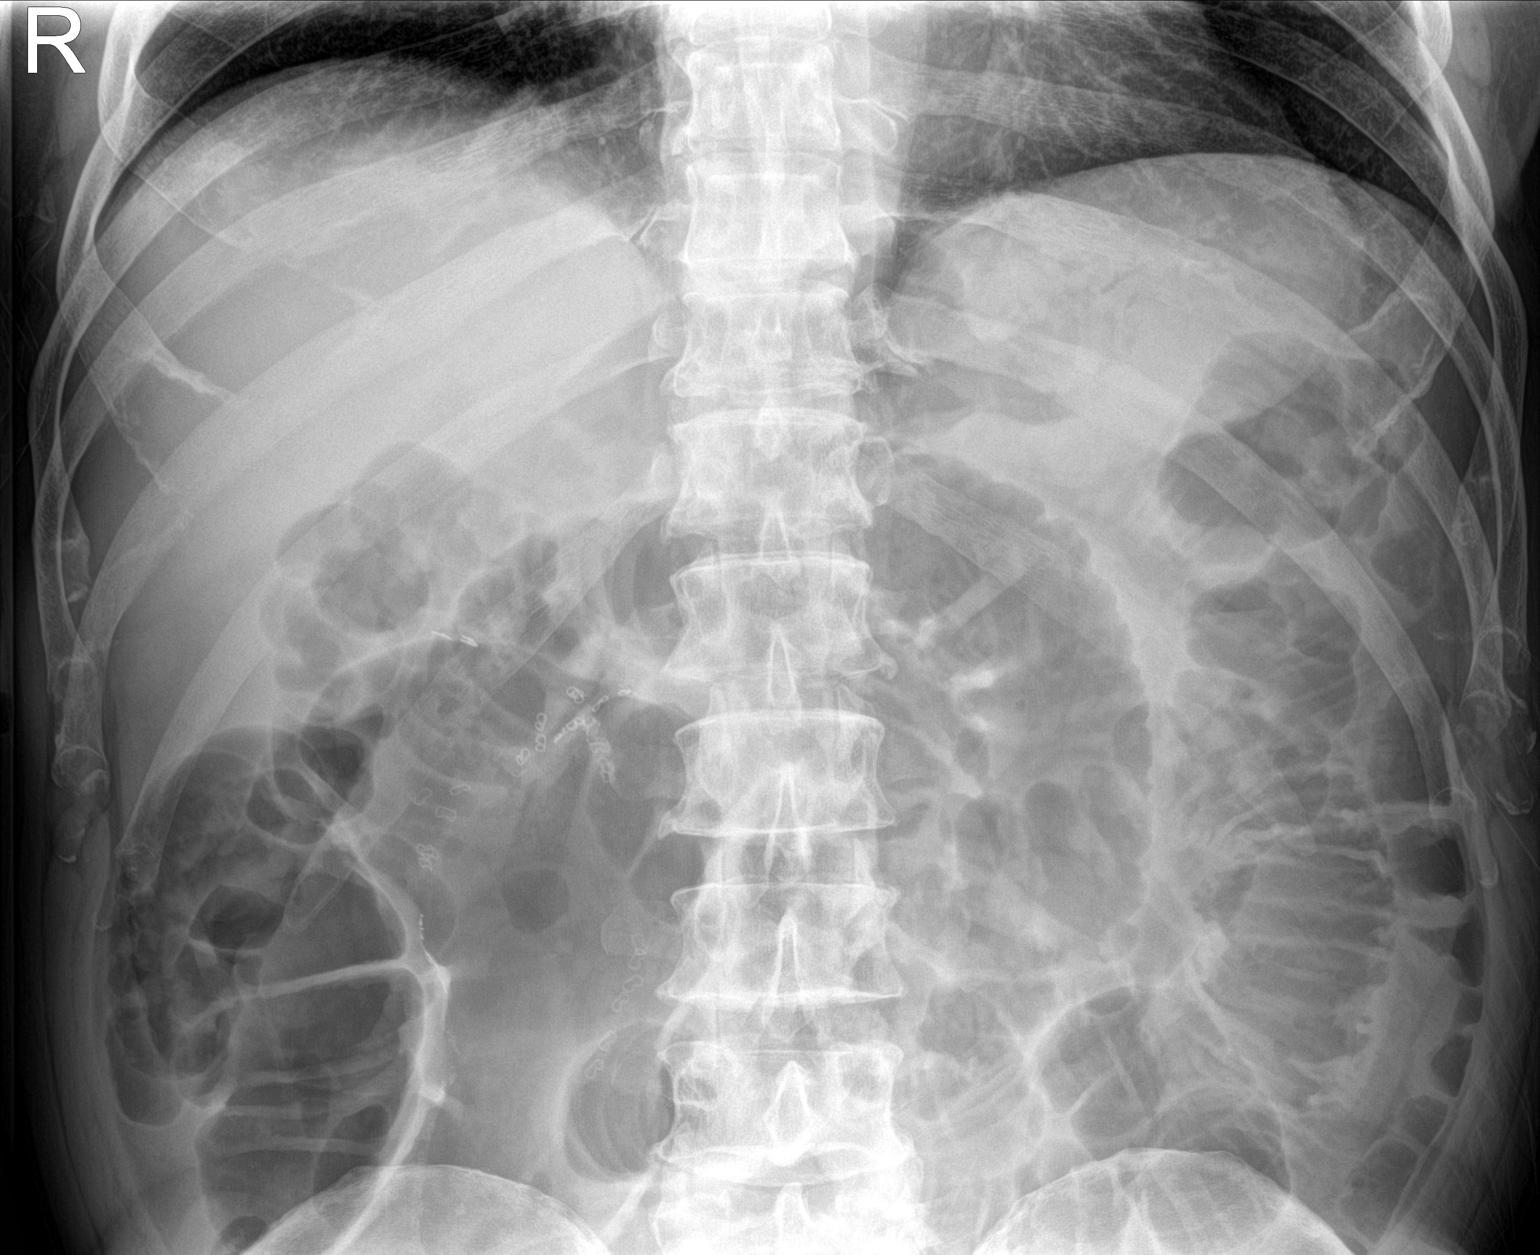

[abdomen supine (2 of 2)]
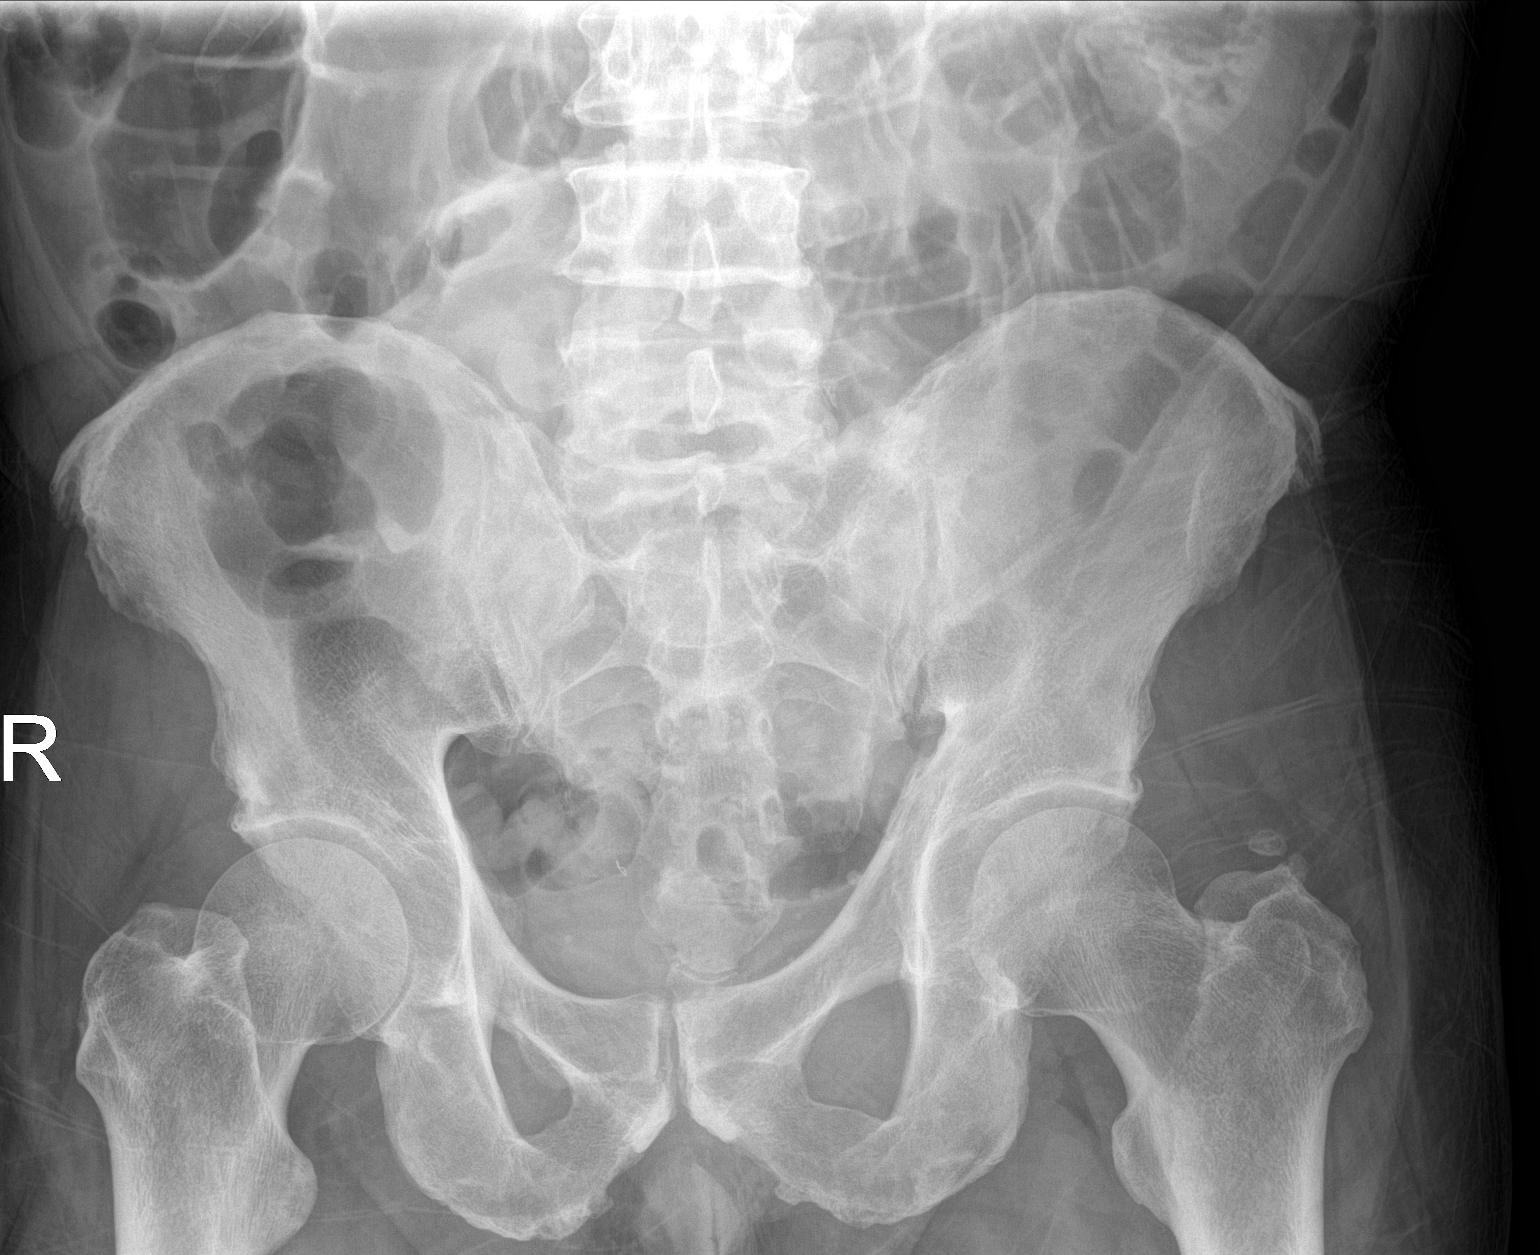

[2 of 2 positions shown; findings below may reference images not displayed]

FINDINGS: Progressive small bowel dilatation in the central abdomen compared
to prior exam. Enteric chain sutures noted in the right mid abdomen.
Air seen throughout the colon. No evidence of free intra-abdominal
air or pneumatosis.
IMPRESSION: Progressive gaseous distention of small bowel, concerning for
recurrent small bowel obstruction.

## 2017-01-06 IMAGING — CR DG ABDOMEN 2V
2 series · 2 of 2 positions shown · non-contrast
Comparison: 03/21/2015; 03/18/2015; 03/17/2015; 08/04/2012; CT
abdomen pelvis - 03/15/2015

CLINICAL DATA: Evaluate small bowel obstruction

EXAM:
ABDOMEN - 2 VIEW

[w abdomen upright]
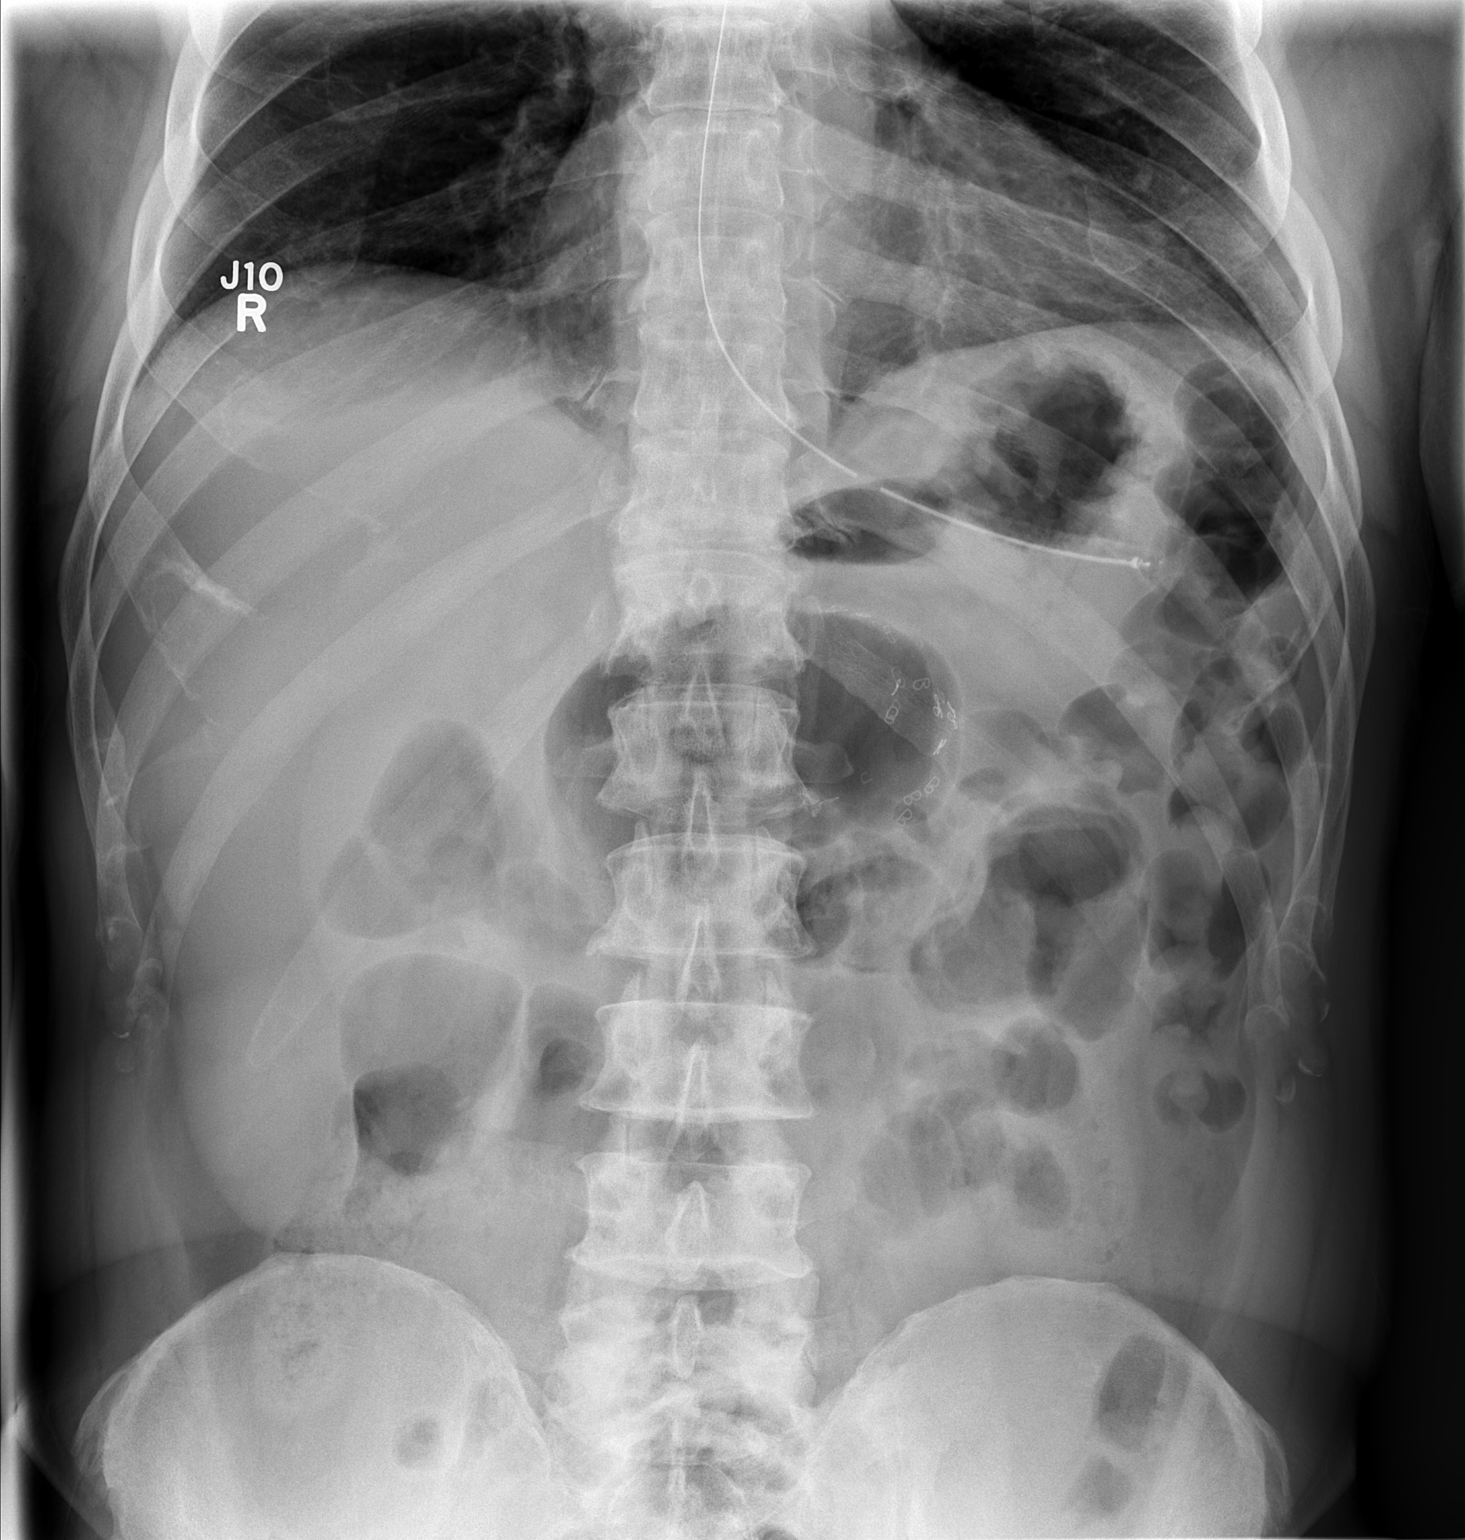

[t abdomen supine]
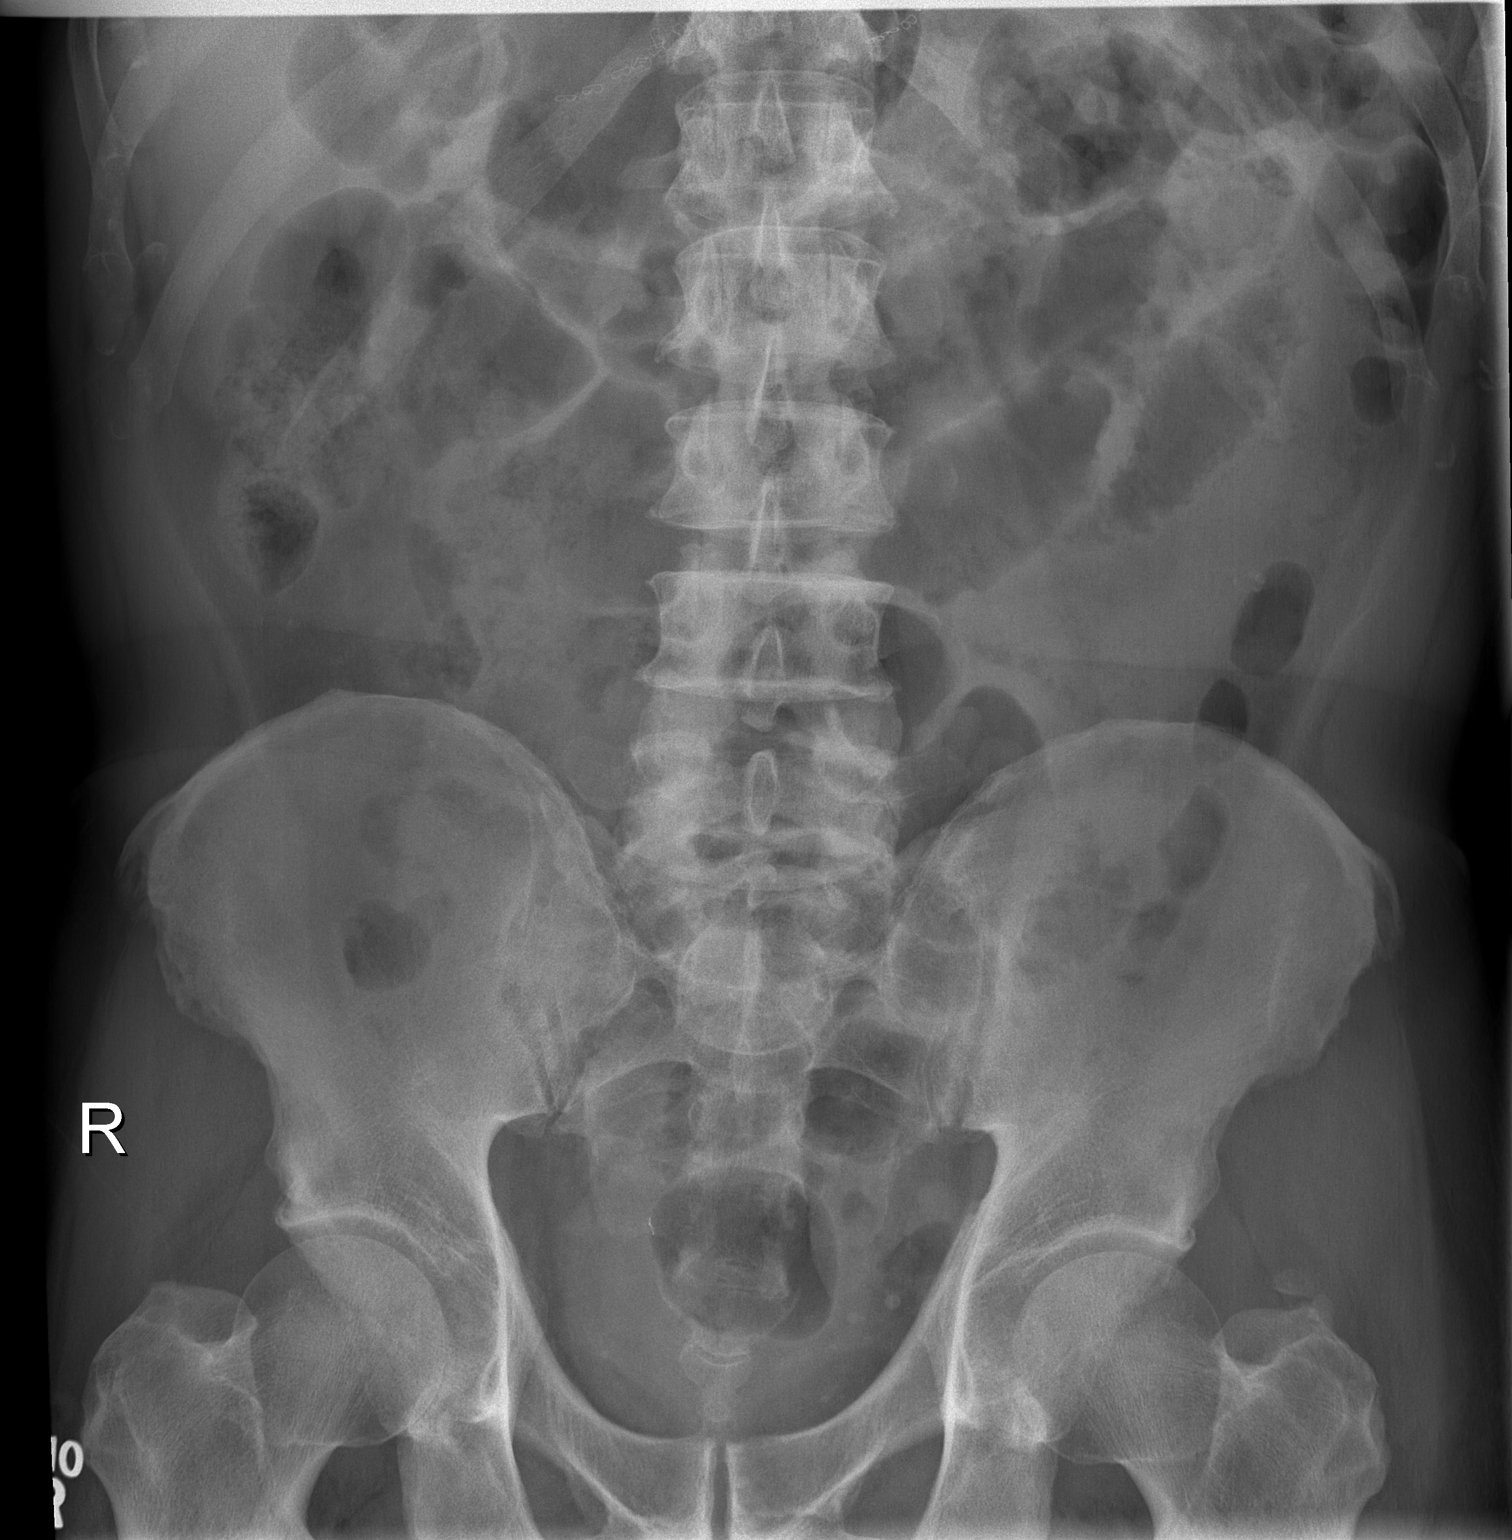

[2 of 2 positions shown; findings below may reference images not displayed]

FINDINGS: Post enteric tube placement with tip and side port projected over
the gastric fundus.

Interval decrease in previously noted marked gas distention of
multiple loops of small bowel with mildly dilated slightly amorphous
appearing loop of small bowel within the left hand mid hemi abdomen
now measuring approximately 4.1 cm in diameter, previously, 4.7 cm.
Gas is seen within the splenic flexure and descending colon. No
pneumoperitoneum, pneumatosis or portal venous gas.

Enteric suture line seen within the left mid hemi abdomen.

Limited visualization of lower thorax is normal.

Unchanged peripherally corticated ossicle adjacent to the left
greater trochanter. Regional osseous structures appear otherwise
normal.
IMPRESSION: Post enteric tube placement with findings suggestive of improved /
resolved small-bowel obstruction.

## 2017-09-14 ENCOUNTER — Other Ambulatory Visit: Payer: Self-pay | Admitting: Otolaryngology

## 2017-09-14 DIAGNOSIS — H903 Sensorineural hearing loss, bilateral: Secondary | ICD-10-CM

## 2017-09-25 ENCOUNTER — Ambulatory Visit
Admission: RE | Admit: 2017-09-25 | Discharge: 2017-09-25 | Disposition: A | Discharge status: Home / Self Care | Payer: Medicare Other | Source: Ambulatory Visit | Attending: Otolaryngology | Admitting: Otolaryngology

## 2017-09-25 DIAGNOSIS — H903 Sensorineural hearing loss, bilateral: Secondary | ICD-10-CM

## 2017-09-25 MED ORDER — GADOBENATE DIMEGLUMINE 529 MG/ML IV SOLN
17.0000 mL | Freq: Once | INTRAVENOUS | Status: AC | PRN
  Administered 2017-09-25: 17 mL via INTRAVENOUS

## 2017-11-21 ENCOUNTER — Other Ambulatory Visit: Payer: Self-pay | Admitting: Family Medicine

## 2017-11-21 DIAGNOSIS — M545 Low back pain: Secondary | ICD-10-CM

## 2017-11-28 ENCOUNTER — Ambulatory Visit
Admission: RE | Admit: 2017-11-28 | Discharge: 2017-11-28 | Disposition: A | Discharge status: Home / Self Care | Payer: Medicare Other | Source: Ambulatory Visit | Attending: Family Medicine | Admitting: Family Medicine

## 2017-11-28 DIAGNOSIS — M545 Low back pain: Secondary | ICD-10-CM

## 2021-12-04 HISTORY — PX: BACK SURGERY: SHX140

## 2022-10-27 ENCOUNTER — Emergency Department (HOSPITAL_BASED_OUTPATIENT_CLINIC_OR_DEPARTMENT_OTHER)
Admission: EM | Admit: 2022-10-27 | Discharge: 2022-10-27 | Disposition: A | Discharge status: Home / Self Care | Payer: Medicare Other | Attending: Emergency Medicine | Admitting: Emergency Medicine

## 2022-10-27 ENCOUNTER — Other Ambulatory Visit: Payer: Self-pay

## 2022-10-27 ENCOUNTER — Emergency Department (HOSPITAL_BASED_OUTPATIENT_CLINIC_OR_DEPARTMENT_OTHER): Payer: Medicare Other

## 2022-10-27 ENCOUNTER — Encounter (HOSPITAL_BASED_OUTPATIENT_CLINIC_OR_DEPARTMENT_OTHER): Payer: Self-pay | Admitting: Emergency Medicine

## 2022-10-27 DIAGNOSIS — S0003XA Contusion of scalp, initial encounter: Secondary | ICD-10-CM | POA: Diagnosis not present

## 2022-10-27 DIAGNOSIS — Z7901 Long term (current) use of anticoagulants: Secondary | ICD-10-CM | POA: Insufficient documentation

## 2022-10-27 DIAGNOSIS — Z7982 Long term (current) use of aspirin: Secondary | ICD-10-CM | POA: Insufficient documentation

## 2022-10-27 DIAGNOSIS — W01190A Fall on same level from slipping, tripping and stumbling with subsequent striking against furniture, initial encounter: Secondary | ICD-10-CM | POA: Insufficient documentation

## 2022-10-27 DIAGNOSIS — E119 Type 2 diabetes mellitus without complications: Secondary | ICD-10-CM | POA: Insufficient documentation

## 2022-10-27 DIAGNOSIS — S0990XA Unspecified injury of head, initial encounter: Secondary | ICD-10-CM | POA: Diagnosis present

## 2022-10-27 DIAGNOSIS — W19XXXA Unspecified fall, initial encounter: Secondary | ICD-10-CM

## 2022-10-27 HISTORY — DX: Personal history of pulmonary embolism: Z86.711

## 2022-10-27 NOTE — Discharge Instructions (Signed)
Your CT scan does not show any evidence of bleeding or fracture from the fall today.  Does show extensive postsurgical changes in your neck which appear to be stable since 2014.  As you have no new weakness, numbness or tingling you should follow-up with Dr. Danielle Dess this week.  Return to the ED with worsening pain, new numbness, new weakness, difficulty breathing, difficulty swallowing, chest pain, shortness of breath or other concerns.

## 2022-10-27 NOTE — ED Provider Notes (Signed)
[facility] EMERGENCY DEPT Provider Note   CSN: [id number] Arrival date & time: 10/27/22  1821     History  Chief Complaint  Patient presents with   James Barber is a 76 y.o. male.  Patient with a history of PE on Xarelto, diabetes, bowel obstruction, chronic back pain status post most recent surgery in September presenting with fall and head injury.  States he was stepping down 1 step approximately 8 inches onto a wooden surface when he slipped in his socks and fell striking his head on a coffee table.  Did not lose consciousness.  No broken glass.  No vomiting.  Behaving normally since.  Complains of pain to right head behind right ear.  Does take Xarelto and he is concerned about this.  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  He takes Percocet at home for this.  Denies injuring his hip or back in the fall today.  No new weakness, numbness or tingling.  No bowel or bladder incontinence.  No fever or vomiting.  No chest pain or shortness of breath.  He has chronic weakness in his right arm from previous neck surgery which is also unchanged.  Denies any preceding dizziness or lightheadedness.  The history is provided by the spouse.  Fall Associated symptoms include headaches. Pertinent negatives include no chest pain, no abdominal pain and no shortness of breath.       Home Medications Prior to Admission medications   Medication Sig Start Date End Date Taking? Authorizing Provider  acetaminophen (TYLENOL) 325 MG tablet Take 2 tablets (650 mg total) by mouth every 6 (six) hours as needed for mild pain, moderate pain, fever or headache. 03/29/15   Sherrie George, PA-C  aspirin EC 81 MG tablet Take 81 mg by mouth daily.    [provider]  cyclobenzaprine (FLEXERIL) 10 MG tablet Take 10 mg by mouth daily as needed for muscle spasms.    [provider]  ferrous sulfate 325 (65 FE) MG tablet You can resume this after your post op  visit as needed. 03/29/15   Sherrie George, PA-C  gabapentin (NEURONTIN) 400 MG capsule Take 1,200 mg by mouth 3 (three) times daily.     [provider]  levothyroxine (SYNTHROID, LEVOTHROID) 50 MCG tablet Take 50 mcg by mouth daily before breakfast.    [provider]  lisinopril (PRINIVIL,ZESTRIL) 20 MG tablet Resume one daily for systolic blood pressure 120 or greater.  If less call PCP and ask for recommendations. 03/29/15   Sherrie George, PA-C  Multiple Vitamin (MULTIVITAMIN WITH MINERALS) TABS tablet Take 1 tablet by mouth daily.    [provider]  nabumetone (RELAFEN) 750 MG tablet Take 750 mg by mouth 2 (two) times daily.    [provider]  omeprazole (PRILOSEC OTC) 20 MG tablet Take 20 mg by mouth every other day.    [provider]  oxyCODONE-acetaminophen (PERCOCET/ROXICET) 5-325 MG per tablet Take 1-2 tablets by mouth every 4 (four) hours as needed. Patient taking differently: Take 1-2 tablets by mouth as needed.  03/29/15   Sherrie George, PA-C  Polyethylene Glycol 400 0.25 % SOLN Apply 1 drop to eye 2 (two) times daily as needed (dry eyes).    [provider]  rivaroxaban (XARELTO) 20 MG TABS tablet Take 1 tablet (20 mg total) by mouth daily with supper. 04/19/15   Parrett, Virgel Bouquet, NP      Allergies    Patient has no  known allergies.    Review of Systems   Review of Systems  Constitutional:  Negative for activity change, appetite change and fever.  HENT:  Negative for congestion and rhinorrhea.   Respiratory:  Negative for cough, chest tightness and shortness of breath.   Cardiovascular:  Negative for chest pain.  Gastrointestinal:  Negative for abdominal pain, nausea and vomiting.  Genitourinary:  Negative for dysuria and testicular pain.  Musculoskeletal:  Positive for arthralgias, back pain and myalgias.  Neurological:  Positive for headaches. Negative for dizziness and weakness.   all other systems are  negative except as noted in the HPI and PMH.    Physical Exam Updated Vital Signs BP (!) 153/83 (BP Location: Right Arm)   Pulse 77   Temp 98.4 F (36.9 C) (Oral)   Resp 17   Ht 6' (1.829 m)   Wt 83.9 kg   SpO2 98%   BMI 25.09 kg/m  Physical Exam Vitals and nursing note reviewed.  Constitutional:      General: He is not in acute distress.    Appearance: He is well-developed.  HENT:     Head: Normocephalic.     Comments: Hematoma right parietal scalp  No septal hematoma or hemotympanum    Mouth/Throat:     Pharynx: No oropharyngeal exudate.  Eyes:     Conjunctiva/sclera: Conjunctivae normal.     Pupils: Pupils are equal, round, and reactive to light.  Neck:     Comments: No midline C-spine tenderness Cardiovascular:     Rate and Rhythm: Normal rate and regular rhythm.     Heart sounds: Normal heart sounds. No murmur heard. Pulmonary:     Effort: Pulmonary effort is normal. No respiratory distress.     Breath sounds: Normal breath sounds.  Abdominal:     Palpations: Abdomen is soft.     Tenderness: There is no abdominal tenderness. There is no guarding or rebound.  Musculoskeletal:        General: No tenderness. Normal range of motion.     Cervical back: Normal range of motion and neck supple.     Comments: 5/5 strength in bilateral lower extremities. Ankle plantar and dorsiflexion intact. Great toe extension intact bilaterally. +2 DP and PT pulses. +2 patellar reflexes bilaterally. Normal gait.  No midline T or L-spine tenderness  Skin:    General: Skin is warm.  Neurological:     Mental Status: He is alert and oriented to person, place, and time.     Cranial Nerves: No cranial nerve deficit.     Motor: No abnormal muscle tone.     Coordination: Coordination normal.     Comments: No ataxia on finger to nose bilaterally. No pronator drift. 5/5 strength throughout. CN 2-12 intact.Equal grip strength. Sensation intact.   Psychiatric:        Behavior: Behavior  normal.     ED Results / Procedures / Treatments   Labs (all labs ordered are listed, but only abnormal results are displayed) Labs Reviewed - No data to display  EKG None  Radiology CT Head Wo Contrast  Result Date: 10/27/2022 CLINICAL DATA:  Fall EXAM: CT HEAD WITHOUT CONTRAST CT CERVICAL SPINE WITHOUT CONTRAST TECHNIQUE: Multidetector CT imaging of the head and cervical spine was performed following the standard protocol without intravenous contrast. Multiplanar CT image reconstructions of the cervical spine were also generated. RADIATION DOSE REDUCTION: This exam was performed according to the departmental dose-optimization program which includes automated exposure control, adjustment of the mA  and/or kV according to patient size and/or use of iterative reconstruction technique. COMPARISON:  MR head 09/25/2017, CT cervical spine 05/20/2013 FINDINGS: CT HEAD FINDINGS Brain: There is no acute intracranial hemorrhage, extra-axial fluid collection, or acute infarct Parenchymal volume is within expected limits for age. The ventricles are normal in size. Gray-white differentiation is preserved. There is no mass lesion.  There is no mass effect or midline shift. Vascular: There is calcification of the bilateral carotid siphons and vertebral arteries. Skull: Normal. Negative for fracture or focal lesion. Sinuses/Orbits: The imaged paranasal sinuses are clear. The globes and orbits are unremarkable. None. Other: None. CT CERVICAL SPINE FINDINGS Alignment: There is trace anterolisthesis of C2 on C3 and C3 on C4. There is no jumped or perched facet or other evidence of traumatic malalignment. Skull base and vertebrae: Skull base alignment is maintained. Vertebral body heights are preserved. There is no evidence of acute fracture. There is no suspicious osseous lesion. Postsurgical changes reflecting C4-C5 and C6 through T1 ACDF are noted there is solid fusion across the C4-C5 and C7-T1 vertebral bodies but  no evidence of fusion across the C6 and C7 vertebral bodies. There is no perihardware lucency. Soft tissues and spinal canal: No prevertebral fluid or swelling. No visible canal hematoma. 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 The appearance is grossly similar to the prior myelogram from 2014. There is no other convincing evidence of high-grade osseous spinal canal stenosis. There is multilevel facet arthropathy, most advanced on the left at C2-C3 and bilaterally at C3-C4 resulting in multilevel neural foraminal stenosis. Upper chest: The imaged lung apices are clear. Other: None. IMPRESSION: 1. No acute intracranial pathology. 2. No acute fracture or traumatic malalignment of the cervical spine. 3. Postsurgical changes reflecting C4-C5 and C6 through T1 ACDF with solid fusion across the C4-C5 and C7-T1 vertebral bodies but no evidence of fusion across the C6 and C7 vertebral bodies. 4. 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 The appearance is grossly similar to the prior myelogram from 2014. Electronically Signed   By: Lesia Hausen M.D.   On: 10/27/2022 19:40   CT Cervical Spine Wo Contrast  Result Date: 10/27/2022 CLINICAL DATA:  Fall EXAM: CT HEAD WITHOUT CONTRAST CT CERVICAL SPINE WITHOUT CONTRAST TECHNIQUE: Multidetector CT imaging of the head and cervical spine was performed following the standard protocol without intravenous contrast. Multiplanar CT image reconstructions of the cervical spine were also generated. RADIATION DOSE REDUCTION: This exam was performed according to the departmental dose-optimization program which includes automated exposure control, adjustment of the mA and/or kV according to patient size and/or use of  iterative reconstruction technique. COMPARISON:  MR head 09/25/2017, CT cervical spine 05/20/2013 FINDINGS: CT HEAD FINDINGS Brain: There is no acute intracranial hemorrhage, extra-axial fluid collection, or acute infarct Parenchymal volume is within expected limits for age. The ventricles are normal in size. Gray-white differentiation is preserved. There is no mass lesion.  There is no mass effect or midline shift. Vascular: There is calcification of the bilateral carotid siphons and vertebral arteries. Skull: Normal. Negative for fracture or focal lesion. Sinuses/Orbits: The imaged paranasal sinuses are clear. The globes and orbits are unremarkable. None. Other: None. CT CERVICAL SPINE FINDINGS Alignment: There is trace anterolisthesis of C2 on C3 and C3 on C4.  There is no jumped or perched facet or other evidence of traumatic malalignment. Skull base and vertebrae: Skull base alignment is maintained. Vertebral body heights are preserved. There is no evidence of acute fracture. There is no suspicious osseous lesion. Postsurgical changes reflecting C4-C5 and C6 through T1 ACDF are noted there is solid fusion across the C4-C5 and C7-T1 vertebral bodies but no evidence of fusion across the C6 and C7 vertebral bodies. There is no perihardware lucency. Soft tissues and spinal canal: No prevertebral fluid or swelling. No visible canal hematoma. 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 The appearance is grossly similar to the prior myelogram from 2014. There is no other convincing evidence of high-grade osseous spinal canal stenosis. There is multilevel facet arthropathy, most advanced on the left at C2-C3 and bilaterally at C3-C4 resulting in multilevel neural foraminal stenosis. Upper chest: The imaged lung apices are clear. Other: None. IMPRESSION: 1. No acute intracranial  pathology. 2. No acute fracture or traumatic malalignment of the cervical spine. 3. Postsurgical changes reflecting C4-C5 and C6 through T1 ACDF with solid fusion across the C4-C5 and C7-T1 vertebral bodies but no evidence of fusion across the C6 and C7 vertebral bodies. 4. 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 The appearance is grossly similar to the prior myelogram from 2014. Electronically Signed   By: Lesia Hausen M.D.   On: 10/27/2022 19:40    Procedures Procedures    Medications Ordered in ED Medications - No data to display  ED Course/ Medical Decision Making/ A&P                           Medical Decision Making Amount and/or Complexity of Data Reviewed Labs: ordered. Decision-making details documented in ED Course. Radiology: ordered and independent interpretation performed. Decision-making details documented in ED Course. ECG/medicine tests: ordered and independent interpretation performed. Decision-making details documented in ED Course.   Fall with head injury on blood thinner.  No syncope.  Has chronic weakness to right arm which is unchanged.  Chronic sciatica as well.  No new weakness, numbness or tingling.  No bowel or bladder incontinence.  Low suspicion for cord compression or cauda equina.  Will obtain a CT head and C-spine given blood thinner use.  CT head shows no acute fracture or hemorrhage.  Does have postsurgical changes throughout cervical spine.  There is ossification of posterior longitudinal ligament causing severe canal stenosis with possible cord compression but appears similar to 2014  D/w Dr. Jake Samples neurosurgery.  As cord compression at C2 and C3 appear stable.  It is unclear why he does not have any fusion between C6 and C7 is not clear if he ever did. he feels with no new neurological deficits, patient stable for follow-up with  Dr. Danielle Dess.  No indication for further imaging or surgery today.  Patient tolerating p.o. and ambulatory.  Patient states his right arm weakness and numbness is unchanged from his baseline.  His head is negative for any acute traumatic injury due to his Xarelto use.  His sciatica is chronic and unchanged.  Discussed PCP and neurosurgery follow-up.  Return precautions given including worsening pain, weakness, numbness, tingling, bowel or bladder incontinence or other concerns.  Final Clinical Impression(s) / ED Diagnoses Final diagnoses:  Fall, initial encounter  Injury of head, initial encounter    Rx / DC Orders ED Discharge Orders     None         Ninoska Goswick, Jeannett SeniorStephen, MD 10/27/22 2038

## 2022-10-27 NOTE — ED Triage Notes (Signed)
Fall down 1 step and hit head on corner of end table. No lac  present On xarelto. For hx of PE No loc, gcs 15 Aox4   C/o sciatic pain which contributed to fall

## 2022-12-04 DIAGNOSIS — I639 Cerebral infarction, unspecified: Secondary | ICD-10-CM

## 2022-12-04 HISTORY — DX: Cerebral infarction, unspecified: I63.9

## 2023-02-17 ENCOUNTER — Other Ambulatory Visit: Payer: Self-pay | Admitting: Neurological Surgery

## 2023-02-19 ENCOUNTER — Other Ambulatory Visit (HOSPITAL_COMMUNITY): Payer: Self-pay | Admitting: Neurological Surgery

## 2023-02-19 DIAGNOSIS — M5416 Radiculopathy, lumbar region: Secondary | ICD-10-CM

## 2023-02-21 ENCOUNTER — Other Ambulatory Visit: Payer: Self-pay

## 2023-02-21 ENCOUNTER — Ambulatory Visit (HOSPITAL_COMMUNITY)
Admission: RE | Admit: 2023-02-21 | Discharge: 2023-02-21 | Disposition: A | Discharge status: Home / Self Care | Payer: Medicare Other | Source: Ambulatory Visit | Attending: Neurological Surgery | Admitting: Neurological Surgery

## 2023-02-21 DIAGNOSIS — M48061 Spinal stenosis, lumbar region without neurogenic claudication: Secondary | ICD-10-CM | POA: Diagnosis not present

## 2023-02-21 DIAGNOSIS — M5416 Radiculopathy, lumbar region: Secondary | ICD-10-CM

## 2023-02-21 DIAGNOSIS — M5116 Intervertebral disc disorders with radiculopathy, lumbar region: Secondary | ICD-10-CM | POA: Diagnosis not present

## 2023-02-21 DIAGNOSIS — M4726 Other spondylosis with radiculopathy, lumbar region: Secondary | ICD-10-CM | POA: Insufficient documentation

## 2023-02-21 DIAGNOSIS — G96198 Other disorders of meninges, not elsewhere classified: Secondary | ICD-10-CM | POA: Insufficient documentation

## 2023-02-21 MED ORDER — LIDOCAINE HCL (PF) 1 % IJ SOLN
5.0000 mL | Freq: Once | INTRAMUSCULAR | Status: AC
  Administered 2023-02-21: 2 mL via INTRADERMAL

## 2023-02-21 MED ORDER — ONDANSETRON HCL 4 MG/2ML IJ SOLN: 4.0000 mg | Freq: Four times a day (QID) | INTRAMUSCULAR | Status: DC | PRN

## 2023-02-21 MED ORDER — DIAZEPAM 5 MG PO TABS
ORAL_TABLET | ORAL | Status: AC
  Filled 2023-02-21: qty 1

## 2023-02-21 MED ORDER — ONDANSETRON HCL 4 MG/2ML IJ SOLN: 4.0000 mg | Freq: Once | INTRAMUSCULAR | Status: DC | PRN

## 2023-02-21 MED ORDER — IOHEXOL 180 MG/ML  SOLN
20.0000 mL | Freq: Once | INTRAMUSCULAR | Status: AC | PRN
  Administered 2023-02-21: 12 mL via INTRATHECAL

## 2023-02-21 MED ORDER — DIAZEPAM 5 MG PO TABS
5.0000 mg | ORAL_TABLET | Freq: Once | ORAL | Status: AC
  Administered 2023-02-21: 5 mg via ORAL

## 2023-02-21 MED ORDER — HYDROCODONE-ACETAMINOPHEN 5-325 MG PO TABS: 1.0000 | ORAL_TABLET | ORAL | Status: DC | PRN

## 2023-02-21 NOTE — Procedures (Addendum)
[name] is a 77 year old individual who has had a severe right lumbar radiculopathy for the past years time he had some foraminal stenosis and underwent laminotomy and decompression however despite this he has had persistent pain.  RI demonstrates that he appears to have a good decompression but continues to have substantial symptoms despite an MRI demonstrates what appears to be a good decompression.  Myelogram and postmyelogram CAT scan has been suggested and is now being performed to evaluate this process further particularly with motion films.  It does appear that motion of his back affects the degree and severity of the pain that he experiences.  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  Further evaluation with CT scanning.

## 2023-02-21 NOTE — Discharge Instructions (Signed)
Per wife/ pt told to restart xarelto this evening

## 2023-04-02 ENCOUNTER — Emergency Department (HOSPITAL_COMMUNITY)
Admission: EM | Admit: 2023-04-02 | Discharge: 2023-04-02 | Disposition: A | Discharge status: Home / Self Care | Payer: Medicare Other | Attending: Emergency Medicine | Admitting: Emergency Medicine

## 2023-04-02 ENCOUNTER — Other Ambulatory Visit: Payer: Self-pay

## 2023-04-02 ENCOUNTER — Emergency Department (HOSPITAL_COMMUNITY): Payer: Medicare Other

## 2023-04-02 ENCOUNTER — Encounter (HOSPITAL_COMMUNITY): Payer: Self-pay

## 2023-04-02 DIAGNOSIS — Z7982 Long term (current) use of aspirin: Secondary | ICD-10-CM | POA: Insufficient documentation

## 2023-04-02 DIAGNOSIS — I10 Essential (primary) hypertension: Secondary | ICD-10-CM | POA: Insufficient documentation

## 2023-04-02 DIAGNOSIS — Z7901 Long term (current) use of anticoagulants: Secondary | ICD-10-CM | POA: Insufficient documentation

## 2023-04-02 DIAGNOSIS — E119 Type 2 diabetes mellitus without complications: Secondary | ICD-10-CM | POA: Insufficient documentation

## 2023-04-02 DIAGNOSIS — S0990XA Unspecified injury of head, initial encounter: Secondary | ICD-10-CM

## 2023-04-02 DIAGNOSIS — W010XXA Fall on same level from slipping, tripping and stumbling without subsequent striking against object, initial encounter: Secondary | ICD-10-CM | POA: Insufficient documentation

## 2023-04-02 DIAGNOSIS — Z79899 Other long term (current) drug therapy: Secondary | ICD-10-CM | POA: Diagnosis not present

## 2023-04-02 DIAGNOSIS — S0101XA Laceration without foreign body of scalp, initial encounter: Secondary | ICD-10-CM | POA: Diagnosis not present

## 2023-04-02 LAB — I-STAT CHEM 8, ED
BUN: 14 mg/dL (ref 8–23)
Calcium, Ion: 1.21 mmol/L (ref 1.15–1.40)
Chloride: 102 mmol/L (ref 98–111)
Creatinine, Ser: 1.3 mg/dL — ABNORMAL HIGH (ref 0.61–1.24)
Glucose, Bld: 135 mg/dL — ABNORMAL HIGH (ref 70–99)
HCT: 41 % (ref 39.0–52.0)
Hemoglobin: 13.9 g/dL (ref 13.0–17.0)
Potassium: 3.6 mmol/L (ref 3.5–5.1)
Sodium: 138 mmol/L (ref 135–145)
TCO2: 25 mmol/L (ref 22–32)

## 2023-04-02 NOTE — ED Provider Notes (Signed)
[place] EMERGENCY DEPARTMENT AT [facility] Provider Note   CSN: [id number] Arrival date & time: 04/02/23  1635     History {Add pertinent medical, surgical, social history, OB history to HPI:1} Chief Complaint  Patient presents with   LEVEL 2    James Barber is a 77 y.o. male.  Patient has a history of hypertension and diabetes and a pulmonary embolus.  He is on Xarelto and fell over and hit his head.  No loss of consciousness   Fall       Home Medications Prior to Admission medications   Medication Sig Start Date End Date Taking? Authorizing Provider  acetaminophen (TYLENOL) 325 MG tablet Take 2 tablets (650 mg total) by mouth every 6 (six) hours as needed for mild pain, moderate pain, fever or headache. 03/29/15   Sherrie George, PA-C  aspirin EC 81 MG tablet Take 81 mg by mouth daily.    [provider]  cyclobenzaprine (FLEXERIL) 10 MG tablet Take 10 mg by mouth daily as needed for muscle spasms.    [provider]  ferrous sulfate 325 (65 FE) MG tablet You can resume this after your post op visit as needed. 03/29/15   Sherrie George, PA-C  gabapentin (NEURONTIN) 400 MG capsule Take 1,200 mg by mouth 3 (three) times daily.     [provider]  levothyroxine (SYNTHROID, LEVOTHROID) 50 MCG tablet Take 50 mcg by mouth daily before breakfast.    [provider]  lisinopril (PRINIVIL,ZESTRIL) 20 MG tablet Resume one daily for systolic blood pressure 120 or greater.  If less call PCP and ask for recommendations. 03/29/15   Sherrie George, PA-C  Multiple Vitamin (MULTIVITAMIN WITH MINERALS) TABS tablet Take 1 tablet by mouth daily.    [provider]  nabumetone (RELAFEN) 750 MG tablet Take 750 mg by mouth 2 (two) times daily.    [provider]  omeprazole (PRILOSEC OTC) 20 MG tablet Take 20 mg by mouth every other day.    [provider]  oxyCODONE-acetaminophen (PERCOCET/ROXICET) 5-325 MG per  tablet Take 1-2 tablets by mouth every 4 (four) hours as needed. Patient taking differently: Take 1-2 tablets by mouth as needed. 03/29/15   Sherrie George, PA-C  Polyethylene Glycol 400 0.25 % SOLN Apply 1 drop to eye 2 (two) times daily as needed (dry eyes).    [provider]  rivaroxaban (XARELTO) 20 MG TABS tablet Take 1 tablet (20 mg total) by mouth daily with supper. 04/19/15   Parrett, Virgel Bouquet, NP      Allergies    Patient has no known allergies.    Review of Systems   Review of Systems  Physical Exam Updated Vital Signs BP (!) 121/109   Pulse 64   Temp 98.5 F (36.9 C) (Oral)   Resp 13   Ht 6' (1.829 m)   Wt 83 kg   SpO2 96%   BMI 24.82 kg/m  Physical Exam  ED Results / Procedures / Treatments   Labs (all labs ordered are listed, but only abnormal results are displayed) Labs Reviewed  I-STAT CHEM 8, ED - Abnormal; Notable for the following components:      Result Value   Creatinine, Ser 1.30 (*)    Glucose, Bld 135 (*)    All other components within normal limits    EKG None  Radiology CT Head Wo Contrast  Result Date: 04/02/2023 CLINICAL DATA:  Head trauma, intracranial arterial injury suspected; Neck trauma (Age >=  65y) EXAM: CT HEAD WITHOUT CONTRAST CT CERVICAL SPINE WITHOUT CONTRAST TECHNIQUE: Multidetector CT imaging of the head and cervical spine was performed following the standard protocol without intravenous contrast. Multiplanar CT image reconstructions of the cervical spine were also generated. RADIATION DOSE REDUCTION: This exam was performed according to the departmental dose-optimization program which includes automated exposure control, adjustment of the mA and/or kV according to patient size and/or use of iterative reconstruction technique. COMPARISON:  MRI head 09/25/2017, CT C-spine 05/20/2013, MRI head 12/07/2022 FINDINGS: CT HEAD FINDINGS Brain: Patchy and confluent areas of decreased attenuation are noted throughout the deep and  periventricular white matter of the cerebral hemispheres bilaterally, compatible with chronic microvascular ischemic disease. 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 No evidence of large-territorial acute infarction. No parenchymal hemorrhage. No mass lesion. No extra-axial collection. No mass effect or midline shift. No hydrocephalus. Basilar cisterns are patent. Vascular: No hyperdense vessel. Atherosclerotic calcifications are present within the cavernous internal carotid and vertebral arteries. Skull: No acute fracture or focal lesion. Sinuses/Orbits: Paranasal sinuses and mastoid air cells are clear. The orbits are unremarkable. Other: None. CT CERVICAL SPINE FINDINGS Alignment: Normal. Skull base and vertebrae: Anterior cervical discectomy and fusion at the C4-L1 levels. Vertebral body osseous fusion complete at the C4-C5 and C5-C6 levels as well as C7-T1 levels. Posterior longitudinal ligament calcification. Multilevel bulky osteophyte formation as well as facet arthropathy. Associated severe osseous neural foraminal stenosis at the right C3-C4, bilateral C4-C5, bilateral C5-C6 levels. No acute fracture. No aggressive appearing focal osseous lesion or focal pathologic process. Soft tissues and spinal canal: No prevertebral fluid or swelling. No visible canal hematoma. Upper chest: Unremarkable. Other: None. IMPRESSION: 1. 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 2. No acute displaced fracture or traumatic listhesis of the cervical spine. 3. Anterior cervical discectomy and fusion at the C4-L1 levels. Severe osseous neural foraminal stenosis at the right C3-C4, bilateral C4-C5, bilateral C5-C6 levels. Electronically Signed   By: Tish Frederickson M.D.   On: 04/02/2023 17:34   CT Cervical Spine Wo  Contrast  Result Date: 04/02/2023 CLINICAL DATA:  Head trauma, intracranial arterial injury suspected; Neck trauma (Age >= 65y) EXAM: CT HEAD WITHOUT CONTRAST CT CERVICAL SPINE WITHOUT CONTRAST TECHNIQUE: Multidetector CT imaging of the head and cervical spine was performed following the standard protocol without intravenous contrast. Multiplanar CT image reconstructions of the cervical spine were also generated. RADIATION DOSE REDUCTION: This exam was performed according to the departmental dose-optimization program which includes automated exposure control, adjustment of the mA and/or kV according to patient size and/or use of iterative reconstruction technique. COMPARISON:  MRI head 09/25/2017, CT C-spine 05/20/2013, MRI head 12/07/2022 FINDINGS: CT HEAD FINDINGS Brain: Patchy and confluent areas of decreased attenuation are noted throughout the deep and periventricular white matter of the cerebral hemispheres bilaterally, compatible with chronic microvascular ischemic disease. 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 No evidence of large-territorial acute infarction. No parenchymal hemorrhage. No mass lesion. No extra-axial collection. No mass effect or midline shift. No hydrocephalus. Basilar cisterns are patent. Vascular: No hyperdense vessel. Atherosclerotic calcifications are present within the cavernous internal carotid and vertebral arteries. Skull: No acute fracture or focal lesion. Sinuses/Orbits: Paranasal sinuses and mastoid air cells are clear. The orbits are unremarkable. Other: None. CT CERVICAL SPINE FINDINGS Alignment:  Normal. Skull base and vertebrae: Anterior cervical discectomy and fusion at the C4-L1 levels. Vertebral body osseous fusion complete at the C4-C5 and C5-C6 levels as well as C7-T1 levels. Posterior longitudinal ligament calcification. Multilevel bulky osteophyte formation as well as facet arthropathy.  Associated severe osseous neural foraminal stenosis at the right C3-C4, bilateral C4-C5, bilateral C5-C6 levels. No acute fracture. No aggressive appearing focal osseous lesion or focal pathologic process. Soft tissues and spinal canal: No prevertebral fluid or swelling. No visible canal hematoma. Upper chest: Unremarkable. Other: None. IMPRESSION: 1. 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 2. No acute displaced fracture or traumatic listhesis of the cervical spine. 3. Anterior cervical discectomy and fusion at the C4-L1 levels. Severe osseous neural foraminal stenosis at the right C3-C4, bilateral C4-C5, bilateral C5-C6 levels. Electronically Signed   By: Tish Frederickson M.D.   On: 04/02/2023 17:34    Procedures Procedures  {Document cardiac monitor, telemetry assessment procedure when appropriate:1}  Medications Ordered in ED Medications - No data to display  ED Course/ Medical Decision Making/ A&P  Patient had a 5 cm laceration to his top of his head.  This was closed with 5 staples {   Click here for ABCD2, HEART and other calculatorsREFRESH Note before signing :1}                          Medical Decision Making Amount and/or Complexity of Data Reviewed Radiology: ordered.   Minor head injury with laceration to scalp.  Laceration was closed with staples and he will follow-up with his PCP  {Document critical care time when appropriate:1} {Document review of labs and clinical decision tools ie heart score, Chads2Vasc2 etc:1}  {Document your independent review of radiology images, and any outside records:1} {Document your discussion with family members, caretakers, and with consultants:1} {Document social determinants of health affecting pt's care:1} {Document your decision making why or why not admission, treatments were needed:1} Final Clinical Impression(s)  / ED Diagnoses Final diagnoses:  None    Rx / DC Orders ED Discharge Orders     None

## 2023-04-02 NOTE — ED Triage Notes (Signed)
Pt to the ed from home via ems with a CC fall on thinners. Pt was re adjusting in bed when he scooted to far over and fell out. Pt has a laceration to right side of head. Bleeding is controled. Pt relays alight pain in head, and right sided hip pain that he says is chronic. Pt denies loc, dizziness, cp, or any other s/s at this time.

## 2023-04-02 NOTE — Progress Notes (Signed)
   04/02/23 1655  Spiritual Encounters  Type of Visit Initial  Care provided to: Patient  Referral source Trauma page  Reason for visit Trauma  OnCall Visit No  Interventions  Spiritual Care Interventions Made Established relationship of care and support;Compassionate presence  Intervention Outcomes  Outcomes Connection to spiritual care;Awareness of support   Chaplain Tiburcio Pea responded to level 2 trauma page. Patient was grateful for the visit but declined further chaplain services at this time. Patient is an G[employer].   Note prepared by Arlyce Dice, Chaplain Resident

## 2023-04-02 NOTE — Discharge Instructions (Signed)
Clean your head with soap and water twice a day gently.  Have your doctor remove the staples and 10-14 days

## 2023-04-17 ENCOUNTER — Other Ambulatory Visit: Payer: Self-pay | Admitting: Neurological Surgery

## 2023-04-26 NOTE — Pre-Procedure Instructions (Signed)
Surgical Instructions    Your procedure is scheduled on May 07, 2023.  Report to [facility] Main Entrance "A" at 5:30 A.M., then check in with the Admitting office.  Call this number if you have problems the morning of surgery:  [phone]  If you have any questions prior to your surgery date call [phone]: Open Monday-Friday 8am-4pm If you experience any cold or flu symptoms such as cough, fever, chills, shortness of breath, etc. between now and your scheduled surgery, please notify us at the above number.     Remember:  Do not eat after midnight the night before your surgery  You may drink clear liquids until 4:30 AM the morning of your surgery.   Clear liquids allowed are: Water, Non-Citrus Juices (without pulp), Carbonated Beverages, Clear Tea, Black Coffee Only (NO MILK, CREAM OR POWDERED CREAMER of any kind), and Gatorade.     Take these medicines the morning of surgery with A SIP OF WATER:  brimonidine (ALPHAGAN) ophthalmic solution   gabapentin (NEURONTIN)    May take these medicines IF NEEDED:  acetaminophen (TYLENOL)   Oxycodone HCl   Polyethyl Glycol-Propyl Glycol (SYSTANE ULTRA) eye drops   Follow your surgeon's instructions on when to stop Aspirin and rivaroxaban (XARELTO).  If no instructions were given by your surgeon then you will need to call the office to get those instructions.     As of today, STOP taking any Aleve, Naproxen, Ibuprofen, Motrin, Advil, Goody's, BC's, all herbal medications, fish oil, and all vitamins.   WHAT DO I DO ABOUT MY DIABETES MEDICATION?   STOP taking your empagliflozin (JARDIANCE) three days prior to surgery. Your last dose of this medication will be May 30th.    DO NOT take your glipiZIDE (GLUCOTROL XL) the night before surgery OR the morning of surgery.   HOW TO MANAGE YOUR DIABETES BEFORE AND AFTER SURGERY  Why is it important to control my blood sugar before and after surgery? Improving blood sugar levels before and  after surgery helps healing and can limit problems. A way of improving blood sugar control is eating a healthy diet by:  Eating less sugar and carbohydrates  Increasing activity/exercise  Talking with your doctor about reaching your blood sugar goals High blood sugars (greater than 180 mg/dL) can raise your risk of infections and slow your recovery, so you will need to focus on controlling your diabetes during the weeks before surgery. Make sure that the doctor who takes care of your diabetes knows about your planned surgery including the date and location.  How do I manage my blood sugar before surgery? Check your blood sugar at least 4 times a day, starting 2 days before surgery, to make sure that the level is not too high or low.  Check your blood sugar the morning of your surgery when you wake up and every 2 hours until you get to the Short Stay unit.  If your blood sugar is less than 70 mg/dL, you will need to treat for low blood sugar: Do not take insulin. Treat a low blood sugar (less than 70 mg/dL) with  cup of clear juice (cranberry or apple), 4 glucose tablets, OR glucose gel. Recheck blood sugar in 15 minutes after treatment (to make sure it is greater than 70 mg/dL). If your blood sugar is not greater than 70 mg/dL on recheck, call [phone] for further instructions. Report your blood sugar to the short stay nurse when you get to Short Stay.  If you are  admitted to the hospital after surgery: Your blood sugar will be checked by the staff and you will probably be given insulin after surgery (instead of oral diabetes medicines) to make sure you have good blood sugar levels. The goal for blood sugar control after surgery is 80-180 mg/dL.                      Do NOT Smoke (Tobacco/Vaping) for 24 hours prior to your procedure.  If you use a CPAP at night, you may bring your mask/headgear for your overnight stay.   Contacts, glasses, piercing's, hearing aid's, dentures or  partials may not be worn into surgery, please bring cases for these belongings.    For patients admitted to the hospital, discharge time will be determined by your treatment team.   Patients discharged the day of surgery will not be allowed to drive home, and someone needs to stay with them for 24 hours.  SURGICAL WAITING ROOM VISITATION Patients having surgery or a procedure may have no more than 2 support people in the waiting area - these visitors may rotate.   Children under the age of 6 must have an adult with them who is not the patient. If the patient needs to stay at the hospital during part of their recovery, the visitor guidelines for inpatient rooms apply. Pre-op nurse will coordinate an appropriate time for 1 support person to accompany patient in pre-op.  This support person may not rotate.   Please refer to the [facility] website for the visitor guidelines for Inpatients (after your surgery is over and you are in a regular room).   If you received a COVID test during your pre-op visit  it is requested that you wear a mask when out in public, stay away from anyone that may not be feeling well and notify your surgeon if you develop symptoms. If you have been in contact with anyone that has tested positive in the last 10 days please notify you surgeon.     Pre-operative 5 CHG Bath Instructions   You can play a key role in reducing the risk of infection after surgery. Your skin needs to be as free of germs as possible. You can reduce the number of germs on your skin by washing with CHG (chlorhexidine gluconate) soap before surgery. CHG is an antiseptic soap that kills germs and continues to kill germs even after washing.   DO NOT use if you have an allergy to chlorhexidine/CHG or antibacterial soaps. If your skin becomes reddened or irritated, stop using the CHG and notify one of our RNs at [phone].   Please shower with the CHG soap starting 4 days before surgery using the  following schedule:     Please keep in mind the following:  DO NOT shave, including legs and underarms, starting the day of your first shower.   You may shave your face at any point before/day of surgery.  Place clean sheets on your bed the day you start using CHG soap. Use a clean washcloth (not used since being washed) for each shower. DO NOT sleep with pets once you start using the CHG.   CHG Shower Instructions:  If you choose to wash your hair and private area, wash first with your normal shampoo/soap.  After you use shampoo/soap, rinse your hair and body thoroughly to remove shampoo/soap residue.  Turn the water OFF and apply about 3 tablespoons (45 ml) of CHG soap to a CLEAN washcloth.  Apply CHG soap ONLY FROM YOUR NECK DOWN TO YOUR TOES (washing for 3-5 minutes)  DO NOT use CHG soap on face, private areas, open wounds, or sores.  Pay special attention to the area where your surgery is being performed.  If you are having back surgery, having someone wash your back for you may be helpful. Wait 2 minutes after CHG soap is applied, then you may rinse off the CHG soap.  Pat dry with a clean towel  Put on clean clothes/pajamas   If you choose to wear lotion, please use ONLY the CHG-compatible lotions on the back of this paper.     Additional instructions for the day of surgery: DO NOT APPLY any lotions, deodorants, cologne, or perfumes.   Do not wear jewelry or makeup Do not bring valuables to the hospital. [facility] is not responsible for any belongings or valuables. Do not wear nail polish, gel polish, artificial nails, or any other type of covering on natural nails (fingers and toes) Put on clean/comfortable clothes.  Brush your teeth.  Ask your nurse before applying any prescription medications to the skin.      CHG Compatible Lotions   Aveeno Moisturizing lotion  Cetaphil Moisturizing Cream  Cetaphil Moisturizing Lotion  Clairol Herbal Essence Moisturizing Lotion,  Dry Skin  Clairol Herbal Essence Moisturizing Lotion, Extra Dry Skin  Clairol Herbal Essence Moisturizing Lotion, Normal Skin  Curel Age Defying Therapeutic Moisturizing Lotion with Alpha Hydroxy  Curel Extreme Care Body Lotion  Curel Soothing Hands Moisturizing Hand Lotion  Curel Therapeutic Moisturizing Cream, Fragrance-Free  Curel Therapeutic Moisturizing Lotion, Fragrance-Free  Curel Therapeutic Moisturizing Lotion, Original Formula  Eucerin Daily Replenishing Lotion  Eucerin Dry Skin Therapy Plus Alpha Hydroxy Crme  Eucerin Dry Skin Therapy Plus Alpha Hydroxy Lotion  Eucerin Original Crme  Eucerin Original Lotion  Eucerin Plus Crme Eucerin Plus Lotion  Eucerin TriLipid Replenishing Lotion  Keri Anti-Bacterial Hand Lotion  Keri Deep Conditioning Original Lotion Dry Skin Formula Softly Scented  Keri Deep Conditioning Original Lotion, Fragrance Free Sensitive Skin Formula  Keri Lotion Fast Absorbing Fragrance Free Sensitive Skin Formula  Keri Lotion Fast Absorbing Softly Scented Dry Skin Formula  Keri Original Lotion  Keri Skin Renewal Lotion Keri Silky Smooth Lotion  Keri Silky Smooth Sensitive Skin Lotion  Nivea Body Creamy Conditioning Oil  Nivea Body Extra Enriched Lotion  Nivea Body Original Lotion  Nivea Body Sheer Moisturizing Lotion Nivea Crme  Nivea Skin Firming Lotion  NutraDerm 30 Skin Lotion  NutraDerm Skin Lotion  NutraDerm Therapeutic Skin Cream  NutraDerm Therapeutic Skin Lotion  ProShield Protective Hand Cream  Provon moisturizing lotion    Please read over the following fact sheets that you were given.

## 2023-04-27 ENCOUNTER — Other Ambulatory Visit: Payer: Self-pay

## 2023-04-27 ENCOUNTER — Encounter (HOSPITAL_COMMUNITY): Payer: Self-pay

## 2023-04-27 ENCOUNTER — Encounter (HOSPITAL_COMMUNITY)
Admission: RE | Admit: 2023-04-27 | Discharge: 2023-04-27 | Disposition: A | Discharge status: Home / Self Care | Payer: Medicare Other | Source: Ambulatory Visit | Attending: Neurological Surgery | Admitting: Neurological Surgery

## 2023-04-27 VITALS — BP 116/66 | HR 75 | Temp 98.2°F | Resp 18 | Ht 72.0 in | Wt 183.0 lb

## 2023-04-27 DIAGNOSIS — E119 Type 2 diabetes mellitus without complications: Secondary | ICD-10-CM | POA: Diagnosis not present

## 2023-04-27 DIAGNOSIS — Z01818 Encounter for other preprocedural examination: Secondary | ICD-10-CM | POA: Insufficient documentation

## 2023-04-27 HISTORY — DX: Sleep apnea, unspecified: G47.30

## 2023-04-27 HISTORY — DX: Malignant (primary) neoplasm, unspecified: C80.1

## 2023-04-27 HISTORY — DX: Hypothyroidism, unspecified: E03.9

## 2023-04-27 LAB — BASIC METABOLIC PANEL
Anion gap: 8 (ref 5–15)
BUN: 15 mg/dL (ref 8–23)
CO2: 22 mmol/L (ref 22–32)
Calcium: 9.3 mg/dL (ref 8.9–10.3)
Chloride: 104 mmol/L (ref 98–111)
Creatinine, Ser: 1.02 mg/dL (ref 0.61–1.24)
GFR, Estimated: >60 mL/min (ref >60)
Glucose, Bld: 98 mg/dL (ref 70–99)
Potassium: 4.1 mmol/L (ref 3.5–5.1)
Sodium: 134 mmol/L — ABNORMAL LOW (ref 135–145)

## 2023-04-27 LAB — CBC
HCT: 44.9 % (ref 39.0–52.0)
Hemoglobin: 14.9 g/dL (ref 13.0–17.0)
MCH: 32.5 pg (ref 26.0–34.0)
MCHC: 33.2 g/dL (ref 30.0–36.0)
MCV: 98 fL (ref 80.0–100.0)
Platelets: 252 10*3/uL (ref 150–400)
RBC: 4.58 MIL/uL (ref 4.22–5.81)
RDW: 13.8 % (ref 11.5–15.5)
WBC: 6.8 10*3/uL (ref 4.0–10.5)
nRBC: 0 % (ref 0.0–0.2)

## 2023-04-27 LAB — SURGICAL PCR SCREEN
MRSA, PCR: NEGATIVE
Staphylococcus aureus: NEGATIVE

## 2023-04-27 LAB — GLUCOSE, CAPILLARY: Glucose-Capillary: 93 mg/dL (ref 70–99)

## 2023-04-27 NOTE — Progress Notes (Addendum)
PCP - Dr. Antony Haste  Cardiologist - Dr. Arther Dames- CE- 01/09/23 (Pt states he followed up with cardiology regarding the Echo he had during hospitalization for stroke. Pt states he was cleared and no longer needs to see cardiology)  Neurologist- Lorelee Cover, MD - pt's wife stated that he has an upcoming appt with neurology next week.  PPM/ICD - denies   Chest x-ray - 01/22/19 EKG - 04/27/23 Stress Test - denies ECHO - 12/29/22 Cardiac Cath - denies  Sleep Study - OSA+ CPAP - nightly  DM- Pt does not check CBG at home and does not know his typical fasting levels  Last dose of GLP1 agonist-  n/a   Blood Thinner Instructions: Hold Xarelto 3 days prior to surgery. 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 Echo from that showed abnormal. Pt followed up with cards 01/09/23. Dr. Arther Dames said everything looked okay. Wore monitor. Results in CE. Pt states that he no longer needs to follow up with cardiology.  Patient denies shortness of breath, fever, cough and chest pain at PAT appointment   All instructions explained to the patient, with a verbal understanding of the material. Patient agrees to go over the instructions while at home for a better understanding. The opportunity to ask questions was provided.

## 2023-05-03 NOTE — Progress Notes (Signed)
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  Recently seen in the ED at [facility] on 12/25/2022 for abnormal CT.  Reportedly, he had started having vision changes or confusion 1 week prior saw PCP who ordered outpatient CT which showed moderate size subacute appearing left occipital lobe infarct and the patient was directed to the ED for further evaluation.  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  Per ED assessment, "I spoke with neurology (James Barber) and he stated there is nothing that they would do further regarding the time of onset. I spoke with the hospitalist here again providing what the neurologist said and the hospitalist, new ER attending James Barber) and I spoke regarding this patient's plan. The hospitalist stated that there was nothing further that he would do for admission wise and that he will be glad to see the patient but would not change much of the plan. After careful consideration and talking with the patient and family the hospitalist (Dr. Benjiman Barber) recommended D/C the patient w/a dose of aspirin and 80 mg Lipitor here in the ED. He will be discharged home with 81 aspirin, 80mg  lipitor and (Plavix for 3 weeks) and to continue Xarelto at current dose. The patient will stop the Plavix after 3 weeks. He will also follow-up with primary care provider. The family stated that the primary care provider already placed a referral to neuro. I do not see one in the chart and the wife stated that she will call them tomorrow to see if they spoke with neurology personally. Well-appearing and is stable to go home."  Patient was seen in follow-up by cardiology at [facility] on 01/09/2023.  Echo showed normal LV size and function, calcification anterior mitral  valve leaflet, negative bubble study.  30-Barber event monitor was ordered which subsequently showed underlying rhythm to be sinus, 3 episodes of SVT longest being 8 beats at a rate of 155, PVC burden <0.1%, P SVC burden 0.18%, no A-fib, no VT, no long pauses 3 seconds or more noted.  No patient triggered events.  Patient followed up with neurologist Dr. Langston Barber at [facility] on 05/03/2023.  Noted to be doing well at that time.  Per note, "James Barber is a 77 y.o. male turns to the office for follow-up in regards to his recent occipital stroke. At this point, the patient has had no change in his visual field deficits. His confusion and difficulty with word finding has mostly resolved. He has completed his course with speech-language pathology and Occupational Therapy. He has found both of these therapies to offer him good benefit. He has been driving some wife after working with occupational therapy who has taught him to scan to the right other techniques. He is planning on undergoing 3 level lumbar fixation on 05/07/2023. He continues on Xarelto due to history of PE but has stopped this on 05/02/2023 in preparation for surgery. Plan to resume once cleared by his neurosurgeon s/p lumbar fusion."  He was recommended to continue aspirin for stroke prevention as well as Xarelto for history of PE.  History of C3-C7 ACDF.  Preop labs reviewed, mild hyponatremia sodium 134, otherwise unremarkable.  EKG 04/27/2023: NSR.  Rate 70.  CT Head 12/25/2022 Moderate sized subacute appearing left occipital lobe infarct. No hemorrhagic conversion.  CTA Head and Neck  12/26/2022 Plaque within the P2 segment of the left posterior cerebral artery with minimal flow distally consistent with a high-grade stenosis. Calcified atherosclerotic plaque within the bilateral V4 segments of the vertebral arteries without significant stenosis. Sclerotic plaque within the distal internal carotid arteries without significant stenosis. No  evidence of aneurysm. No dissection.  MRI head W/ and W/O contrast 12/27/2022 Acute infarct involving the left occipital lobe extending throughout the posterior mid aspect of the left temporal lobe. Small focus of acute infarct in the left frontal lobe No intracranial hemorrhage. Mild edema without mass effect or midline shift.  Echocardiogram with bubble study 12/29/2022: Left Ventricle: Systolic function is normal. EF: 60-65%. Left Ventricle: Doppler parameters indicate normal diastolic function. Tricuspid Valve: The right ventricular systolic pressure is at upper limit of normal - about 31 mmHg. Left Atrium: Injection of agitated saline documents no interatrial shunt. Mitral Valve: Mitral valve structure is normal. The leaflets exhibit normal excursion. There is a small echodense structure at the tip of the anterior leaflet of mitral valve and attachment of MV chordae. It measures 6.5 mm x 2.5 mm. It more likely represents calcification but old calcified vegetation cannot be excluded. Other possible etiologies also cannot be excluded of the tip of the anterior leaflet. Otherwise no thickening or calcification of mitral valve leaflets noted. Cardiac MRI and the TEE could be considered for better evaluation of mitral valve if clinically indicated.  30 Barber Cardiac Event Monitor 01/02/2023: Underlying rhythm is Sinus. 3 supraventricular episodes were found. Patient was asymptomatic. No A. fib, no VT, no long pauses 3 seconds or more noted.     James Barber [facility] Stay [facility]/Anesthesiology Phone [phone] 05/04/2023 9:38 AM

## 2023-05-04 NOTE — Anesthesia Preprocedure Evaluation (Signed)
Anesthesia Evaluation  Patient identified by MRN, date of birth, ID band Patient awake    Reviewed: Allergy & Precautions, NPO status , Patient's Chart, lab work & pertinent test results  Airway Mallampati: II  TM Distance: >3 FB Neck ROM: Limited    Dental  (+) Teeth Intact, Dental Advisory Given   Pulmonary sleep apnea    Pulmonary exam normal breath sounds clear to auscultation       Cardiovascular hypertension, Pt. on medications Normal cardiovascular exam Rhythm:Regular Rate:Normal  Echo 12/29/2022 (CE): Left Ventricle: Systolic function is normal. EF: 60-65%. Left Ventricle: Doppler parameters indicate normal diastolic function. Tricuspid Valve: The right ventricular systolic pressure is at upper limit of normal - about 31 mmHg. Left Atrium: Injection of agitated saline documents no interatrial shunt. Mitral Valve: Mitral valve structure is normal. The leaflets exhibit normal excursion. There is a small echodense structure at the tip of the anterior leaflet of mitral valve and attachment of MV chordae. It measures 6.5 mm x 2.5 mm. It more likely represents calcification but old calcified vegetation cannot be excluded. Other possible etiologies also cannot be excluded of the tip of the anterior leaflet. Otherwise no thickening or calcification of mitral valve leaflets noted. Cardiac MRI and the TEE could be considered for better evaluation of mitral valve if clinically indicated.     Neuro/Psych CVA    GI/Hepatic Neg liver ROS,GERD  ,,  Endo/Other  diabetesHypothyroidism    Renal/GU negative Renal ROS     Musculoskeletal negative musculoskeletal ROS (+)    Abdominal   Peds  Hematology  (+) Blood dyscrasia, anemia   Anesthesia Other Findings   Reproductive/Obstetrics                             Anesthesia Physical Anesthesia Plan  ASA: 3  Anesthesia Plan: General   Post-op Pain  Management: Tylenol PO (pre-op)*   Induction: Intravenous  PONV Risk Score and Plan: Ondansetron, Dexamethasone and Treatment may vary due to age or medical condition  Airway Management Planned: Oral ETT  Additional Equipment:   Intra-op Plan:   Post-operative Plan: Extubation in OR  Informed Consent: I have reviewed the patients History and Physical, chart, labs and discussed the procedure including the risks, benefits and alternatives for the proposed anesthesia with the patient or authorized representative who has indicated his/her understanding and acceptance.     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  Recently seen in the ED at [facility] on 12/25/2022 for abnormal CT.  Reportedly, he had started having vision changes or confusion 1 week prior saw PCP who ordered outpatient CT which showed moderate size subacute appearing left occipital lobe infarct and the patient was directed to the ED for further evaluation.  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  Per ED assessment, "I spoke with neurology (Smigrodzki, Rafal) and he stated there is nothing that they would do further regarding the time of onset. I spoke with the hospitalist here again providing what the neurologist said and the hospitalist, new ER attending Ladona Ridgel Day) and I spoke regarding this patient's plan. The hospitalist stated that there was nothing further that he  would do for admission wise and that he will be glad to see the patient but would not change much of the plan. After careful consideration and talking with the patient and family the hospitalist (Dr. Benjiman Core) recommended D/C the  patient w/a dose of aspirin and 80 mg Lipitor here in the ED. He will be discharged home with 81 aspirin, 80mg  lipitor and (Plavix for 3 weeks) and to continue Xarelto at current dose. The patient will stop the Plavix after 3 weeks. He will also follow-up with primary care provider. The family stated that the primary care provider already placed a referral to neuro. I do not see one in the chart and the wife stated that she will call them tomorrow to see if they spoke with neurology personally. Well-appearing and is stable to go home."  Patient was seen in follow-up by cardiology at [facility] on 01/09/2023.  Echo showed normal LV size and function, calcification anterior mitral valve leaflet, negative bubble study.  30-day event monitor was ordered which subsequently showed underlying rhythm to be sinus, 3 episodes of SVT longest being 8 beats at a rate of 155, PVC burden <0.1%, P SVC burden 0.18%, no A-fib, no VT, no long pauses 3 seconds or more noted.  No patient triggered events.  Patient followed up with neurologist Dr. Langston Masker at [place] on 05/03/2023.  Noted to be doing well at that time.  Per note, "Mr. Temitope Fekete is a 77 y.o. male turns to the office for follow-up in regards to his recent occipital stroke. At this point, the patient has had no change in his visual field deficits. His confusion and difficulty with word finding has mostly resolved. He has completed his course with speech-language pathology and Occupational Therapy. He has found both of these therapies to offer him good benefit. He has been driving some wife after working with occupational therapy who has taught him to scan to the right other techniques. He is planning on undergoing 3 level lumbar fixation on 05/07/2023. He continues on Xarelto due to history of PE but has stopped this on 05/02/2023 in preparation for surgery. Plan to resume once cleared by his neurosurgeon s/p lumbar fusion."  He was recommended to continue aspirin for  stroke prevention as well as Xarelto for history of PE.  History of C3-C7 ACDF.  Preop labs reviewed, mild hyponatremia sodium 134, otherwise unremarkable.  EKG 04/27/2023: NSR.  Rate 70.  CT Head 12/25/2022 Moderate sized subacute appearing left occipital lobe infarct. No hemorrhagic conversion.  CTA Head and Neck 12/26/2022 Plaque within the P2 segment of the left posterior cerebral artery with minimal flow distally consistent with a high-grade stenosis. Calcified atherosclerotic plaque within the bilateral V4 segments of the vertebral arteries without significant stenosis. Sclerotic plaque within the distal internal carotid arteries without significant stenosis. No evidence of aneurysm. No dissection.  MRI head W/ and W/O contrast 12/27/2022 Acute infarct involving the left occipital lobe extending throughout the posterior mid aspect of the left temporal lobe. Small focus of acute infarct in the left frontal lobe No intracranial hemorrhage. Mild edema without mass effect or midline shift.  Echocardiogram with bubble study 12/29/2022: Left Ventricle: Systolic function is normal. EF: 60-65%. Left Ventricle: Doppler parameters indicate normal diastolic function. Tricuspid Valve: The right ventricular systolic pressure is at upper limit of normal - about 31 mmHg. Left Atrium: Injection of agitated saline documents no interatrial shunt. Mitral Valve: Mitral valve structure is normal. The leaflets exhibit normal excursion. There is a  small echodense structure at the tip of the anterior leaflet of mitral valve and attachment of MV chordae. It measures 6.5 mm x 2.5 mm. It more likely represents calcification but old calcified vegetation cannot be excluded. Other possible etiologies also cannot be excluded of the tip of the anterior leaflet. Otherwise no thickening or calcification of mitral valve leaflets noted. Cardiac MRI and the TEE could be considered for better evaluation of mitral valve if  clinically indicated.  30 Day Cardiac Event Monitor 01/02/2023: Underlying rhythm is Sinus. 3 supraventricular episodes were found. Patient was asymptomatic. No A. fib, no VT, no long pauses 3 seconds or more noted.   )        Anesthesia Quick Evaluation

## 2023-05-07 ENCOUNTER — Inpatient Hospital Stay (HOSPITAL_COMMUNITY): Payer: Medicare Other

## 2023-05-07 ENCOUNTER — Encounter (HOSPITAL_COMMUNITY): Payer: Self-pay | Admitting: Neurological Surgery

## 2023-05-07 ENCOUNTER — Inpatient Hospital Stay (HOSPITAL_COMMUNITY)
Admission: RE | Disposition: A | Discharge status: Skilled Nursing Facility | Payer: Self-pay | Source: Ambulatory Visit | Attending: Neurological Surgery

## 2023-05-07 ENCOUNTER — Inpatient Hospital Stay (HOSPITAL_COMMUNITY): Payer: Medicare Other | Admitting: Anesthesiology

## 2023-05-07 ENCOUNTER — Other Ambulatory Visit: Payer: Self-pay

## 2023-05-07 ENCOUNTER — Inpatient Hospital Stay (HOSPITAL_COMMUNITY)
Admission: RE | Admit: 2023-05-07 | Discharge: 2023-05-11 | DRG: 454 | Disposition: A | Discharge status: Skilled Nursing Facility | Payer: Medicare Other | Source: Ambulatory Visit | Attending: Neurological Surgery | Admitting: Neurological Surgery

## 2023-05-07 ENCOUNTER — Inpatient Hospital Stay (HOSPITAL_COMMUNITY): Payer: Medicare Other | Admitting: Physician Assistant

## 2023-05-07 DIAGNOSIS — E039 Hypothyroidism, unspecified: Secondary | ICD-10-CM

## 2023-05-07 DIAGNOSIS — Z79899 Other long term (current) drug therapy: Secondary | ICD-10-CM | POA: Diagnosis not present

## 2023-05-07 DIAGNOSIS — M5416 Radiculopathy, lumbar region: Secondary | ICD-10-CM

## 2023-05-07 DIAGNOSIS — M79604 Pain in right leg: Secondary | ICD-10-CM | POA: Diagnosis present

## 2023-05-07 DIAGNOSIS — M5117 Intervertebral disc disorders with radiculopathy, lumbosacral region: Secondary | ICD-10-CM | POA: Diagnosis present

## 2023-05-07 DIAGNOSIS — M5106 Intervertebral disc disorders with myelopathy, lumbar region: Secondary | ICD-10-CM | POA: Diagnosis present

## 2023-05-07 DIAGNOSIS — R531 Weakness: Secondary | ICD-10-CM | POA: Diagnosis present

## 2023-05-07 DIAGNOSIS — M4807 Spinal stenosis, lumbosacral region: Secondary | ICD-10-CM | POA: Diagnosis present

## 2023-05-07 DIAGNOSIS — Z79890 Hormone replacement therapy (postmenopausal): Secondary | ICD-10-CM

## 2023-05-07 DIAGNOSIS — Z86711 Personal history of pulmonary embolism: Secondary | ICD-10-CM

## 2023-05-07 DIAGNOSIS — Z7984 Long term (current) use of oral hypoglycemic drugs: Secondary | ICD-10-CM

## 2023-05-07 DIAGNOSIS — Z8619 Personal history of other infectious and parasitic diseases: Secondary | ICD-10-CM | POA: Diagnosis not present

## 2023-05-07 DIAGNOSIS — Z981 Arthrodesis status: Secondary | ICD-10-CM

## 2023-05-07 DIAGNOSIS — K219 Gastro-esophageal reflux disease without esophagitis: Secondary | ICD-10-CM | POA: Diagnosis present

## 2023-05-07 DIAGNOSIS — D638 Anemia in other chronic diseases classified elsewhere: Secondary | ICD-10-CM

## 2023-05-07 DIAGNOSIS — I10 Essential (primary) hypertension: Secondary | ICD-10-CM | POA: Diagnosis not present

## 2023-05-07 DIAGNOSIS — M4716 Other spondylosis with myelopathy, lumbar region: Secondary | ICD-10-CM | POA: Diagnosis present

## 2023-05-07 DIAGNOSIS — M48061 Spinal stenosis, lumbar region without neurogenic claudication: Principal | ICD-10-CM | POA: Diagnosis present

## 2023-05-07 DIAGNOSIS — Z8719 Personal history of other diseases of the digestive system: Secondary | ICD-10-CM | POA: Diagnosis not present

## 2023-05-07 DIAGNOSIS — Z8673 Personal history of transient ischemic attack (TIA), and cerebral infarction without residual deficits: Secondary | ICD-10-CM

## 2023-05-07 DIAGNOSIS — E119 Type 2 diabetes mellitus without complications: Secondary | ICD-10-CM | POA: Diagnosis present

## 2023-05-07 DIAGNOSIS — M4726 Other spondylosis with radiculopathy, lumbar region: Secondary | ICD-10-CM | POA: Diagnosis present

## 2023-05-07 DIAGNOSIS — G4730 Sleep apnea, unspecified: Secondary | ICD-10-CM | POA: Diagnosis present

## 2023-05-07 DIAGNOSIS — Z7982 Long term (current) use of aspirin: Secondary | ICD-10-CM | POA: Diagnosis not present

## 2023-05-07 DIAGNOSIS — M4316 Spondylolisthesis, lumbar region: Secondary | ICD-10-CM | POA: Diagnosis present

## 2023-05-07 DIAGNOSIS — M4727 Other spondylosis with radiculopathy, lumbosacral region: Secondary | ICD-10-CM | POA: Diagnosis present

## 2023-05-07 DIAGNOSIS — Z7901 Long term (current) use of anticoagulants: Secondary | ICD-10-CM

## 2023-05-07 HISTORY — DX: Other complications of anesthesia, initial encounter: T88.59XA

## 2023-05-07 LAB — TYPE AND SCREEN

## 2023-05-07 LAB — POCT I-STAT, CHEM 8
BUN: 17 mg/dL (ref 8–23)
Calcium, Ion: 1.2 mmol/L (ref 1.15–1.40)
Chloride: 101 mmol/L (ref 98–111)
Creatinine, Ser: 0.9 mg/dL (ref 0.61–1.24)
Glucose, Bld: 168 mg/dL — ABNORMAL HIGH (ref 70–99)
HCT: 38 % — ABNORMAL LOW (ref 39.0–52.0)
Hemoglobin: 12.9 g/dL — ABNORMAL LOW (ref 13.0–17.0)
Potassium: 4.5 mmol/L (ref 3.5–5.1)
Sodium: 137 mmol/L (ref 135–145)
TCO2: 28 mmol/L (ref 22–32)

## 2023-05-07 LAB — GLUCOSE, CAPILLARY
Glucose-Capillary: 121 mg/dL — ABNORMAL HIGH (ref 70–99)
Glucose-Capillary: 131 mg/dL — ABNORMAL HIGH (ref 70–99)
Glucose-Capillary: 164 mg/dL — ABNORMAL HIGH (ref 70–99)
Glucose-Capillary: 166 mg/dL — ABNORMAL HIGH (ref 70–99)
Glucose-Capillary: 173 mg/dL — ABNORMAL HIGH (ref 70–99)
Glucose-Capillary: 215 mg/dL — ABNORMAL HIGH (ref 70–99)

## 2023-05-07 LAB — CBC
HCT: 39.3 % (ref 39.0–52.0)
Hemoglobin: 12.7 g/dL — ABNORMAL LOW (ref 13.0–17.0)
MCH: 32.3 pg (ref 26.0–34.0)
MCHC: 32.3 g/dL (ref 30.0–36.0)
MCV: 100 fL (ref 80.0–100.0)
Platelets: 198 10*3/uL (ref 150–400)
RBC: 3.93 MIL/uL — ABNORMAL LOW (ref 4.22–5.81)
RDW: 13.7 % (ref 11.5–15.5)
WBC: 14.6 10*3/uL — ABNORMAL HIGH (ref 4.0–10.5)
nRBC: 0 % (ref 0.0–0.2)

## 2023-05-07 LAB — PREPARE RBC (CROSSMATCH)

## 2023-05-07 LAB — BPAM RBC

## 2023-05-07 SURGERY — POSTERIOR LUMBAR FUSION 2 LEVEL
Anesthesia: General | Site: Spine Lumbar

## 2023-05-07 MED ORDER — ATORVASTATIN CALCIUM 80 MG PO TABS
80.0000 mg | ORAL_TABLET | Freq: Every day | ORAL | Status: DC
  Administered 2023-05-07 – 2023-05-10 (×4): 80 mg via ORAL
  Filled 2023-05-07 (×4): qty 1

## 2023-05-07 MED ORDER — CEFAZOLIN SODIUM-DEXTROSE 2-4 GM/100ML-% IV SOLN
2.0000 g | Freq: Three times a day (TID) | INTRAVENOUS | Status: AC
  Administered 2023-05-07 – 2023-05-08 (×2): 2 g via INTRAVENOUS
  Filled 2023-05-07 (×2): qty 100

## 2023-05-07 MED ORDER — TIZANIDINE HCL 4 MG PO TABS
4.0000 mg | ORAL_TABLET | Freq: Every day | ORAL | Status: DC
  Administered 2023-05-07 – 2023-05-10 (×4): 4 mg via ORAL
  Filled 2023-05-07 (×4): qty 1

## 2023-05-07 MED ORDER — THROMBIN 5000 UNITS EX SOLR: OROMUCOSAL | Status: DC | PRN

## 2023-05-07 MED ORDER — SODIUM CHLORIDE 0.9% IV SOLUTION
Freq: Once | INTRAVENOUS | Status: DC
Start: 2023-05-07 — End: 2023-05-07

## 2023-05-07 MED ORDER — EPHEDRINE SULFATE-NACL 50-0.9 MG/10ML-% IV SOSY
PREFILLED_SYRINGE | INTRAVENOUS | Status: DC | PRN
  Administered 2023-05-07 (×2): 2.5 mg via INTRAVENOUS

## 2023-05-07 MED ORDER — PROPOFOL 10 MG/ML IV BOLUS
INTRAVENOUS | Status: AC
  Filled 2023-05-07: qty 20

## 2023-05-07 MED ORDER — HYDROMORPHONE HCL 1 MG/ML IJ SOLN
INTRAMUSCULAR | Status: AC
  Filled 2023-05-07: qty 0.5

## 2023-05-07 MED ORDER — EMPAGLIFLOZIN 25 MG PO TABS
25.0000 mg | ORAL_TABLET | Freq: Every morning | ORAL | Status: DC
  Administered 2023-05-08 – 2023-05-11 (×4): 25 mg via ORAL
  Filled 2023-05-07 (×5): qty 1

## 2023-05-07 MED ORDER — SENNA 8.6 MG PO TABS
1.0000 | ORAL_TABLET | Freq: Two times a day (BID) | ORAL | Status: DC
  Administered 2023-05-07 – 2023-05-11 (×8): 8.6 mg via ORAL
  Filled 2023-05-07 (×8): qty 1

## 2023-05-07 MED ORDER — ONDANSETRON HCL 4 MG/2ML IJ SOLN
INTRAMUSCULAR | Status: AC
  Filled 2023-05-07: qty 2

## 2023-05-07 MED ORDER — INSULIN ASPART 100 UNIT/ML IJ SOLN: 0.0000 [IU] | INTRAMUSCULAR | Status: DC | PRN

## 2023-05-07 MED ORDER — DEXAMETHASONE SODIUM PHOSPHATE 10 MG/ML IJ SOLN
INTRAMUSCULAR | Status: AC
  Filled 2023-05-07: qty 1

## 2023-05-07 MED ORDER — CHLORHEXIDINE GLUCONATE CLOTH 2 % EX PADS: 6.0000 | MEDICATED_PAD | Freq: Once | CUTANEOUS | Status: DC

## 2023-05-07 MED ORDER — LIDOCAINE 2% (20 MG/ML) 5 ML SYRINGE
INTRAMUSCULAR | Status: AC
  Filled 2023-05-07: qty 10

## 2023-05-07 MED ORDER — PHENOL 1.4 % MT LIQD: 1.0000 | OROMUCOSAL | Status: DC | PRN

## 2023-05-07 MED ORDER — 0.9 % SODIUM CHLORIDE (POUR BTL) OPTIME
TOPICAL | Status: DC | PRN
  Administered 2023-05-07: 1000 mL

## 2023-05-07 MED ORDER — LISINOPRIL 20 MG PO TABS
20.0000 mg | ORAL_TABLET | Freq: Every day | ORAL | Status: DC
  Administered 2023-05-07 – 2023-05-11 (×3): 20 mg via ORAL
  Filled 2023-05-07 (×5): qty 1

## 2023-05-07 MED ORDER — ACETAMINOPHEN 325 MG PO TABS
650.0000 mg | ORAL_TABLET | ORAL | Status: DC | PRN
  Administered 2023-05-08 – 2023-05-10 (×2): 650 mg via ORAL
  Filled 2023-05-07 (×2): qty 2

## 2023-05-07 MED ORDER — FENTANYL CITRATE (PF) 250 MCG/5ML IJ SOLN
INTRAMUSCULAR | Status: DC | PRN
  Administered 2023-05-07 (×2): 50 ug via INTRAVENOUS
  Administered 2023-05-07: 100 ug via INTRAVENOUS
  Administered 2023-05-07: 50 ug via INTRAVENOUS

## 2023-05-07 MED ORDER — SUGAMMADEX SODIUM 200 MG/2ML IV SOLN
INTRAVENOUS | Status: DC | PRN
  Administered 2023-05-07: 200 mg via INTRAVENOUS

## 2023-05-07 MED ORDER — BRIMONIDINE TARTRATE 0.2 % OP SOLN
1.0000 [drp] | Freq: Two times a day (BID) | OPHTHALMIC | Status: DC
  Administered 2023-05-07 – 2023-05-11 (×8): 1 [drp] via OPHTHALMIC
  Filled 2023-05-07: qty 5

## 2023-05-07 MED ORDER — BISACODYL 10 MG RE SUPP: 10.0000 mg | Freq: Every day | RECTAL | Status: DC | PRN

## 2023-05-07 MED ORDER — FENTANYL CITRATE (PF) 250 MCG/5ML IJ SOLN
INTRAMUSCULAR | Status: AC
  Filled 2023-05-07: qty 5

## 2023-05-07 MED ORDER — PHENYLEPHRINE HCL-NACL 20-0.9 MG/250ML-% IV SOLN
INTRAVENOUS | Status: DC | PRN
  Administered 2023-05-07: 45 ug/min via INTRAVENOUS

## 2023-05-07 MED ORDER — PHENYLEPHRINE 80 MCG/ML (10ML) SYRINGE FOR IV PUSH (FOR BLOOD PRESSURE SUPPORT)
PREFILLED_SYRINGE | INTRAVENOUS | Status: DC | PRN
  Administered 2023-05-07: 160 ug via INTRAVENOUS
  Administered 2023-05-07 (×2): 40 ug via INTRAVENOUS
  Administered 2023-05-07: 160 ug via INTRAVENOUS

## 2023-05-07 MED ORDER — SODIUM CHLORIDE 0.9% FLUSH: 3.0000 mL | INTRAVENOUS | Status: DC | PRN

## 2023-05-07 MED ORDER — GLIPIZIDE ER 10 MG PO TB24
10.0000 mg | ORAL_TABLET | Freq: Every day | ORAL | Status: DC
  Administered 2023-05-07 – 2023-05-10 (×4): 10 mg via ORAL
  Filled 2023-05-07 (×5): qty 1

## 2023-05-07 MED ORDER — LIDOCAINE-EPINEPHRINE 1 %-1:100000 IJ SOLN
INTRAMUSCULAR | Status: DC | PRN
  Administered 2023-05-07: 5 mL

## 2023-05-07 MED ORDER — SODIUM CHLORIDE 0.9 % IV SOLN
250.0000 mL | INTRAVENOUS | Status: DC
  Administered 2023-05-07: 250 mL via INTRAVENOUS

## 2023-05-07 MED ORDER — BUPIVACAINE HCL (PF) 0.5 % IJ SOLN
INTRAMUSCULAR | Status: DC | PRN
  Administered 2023-05-07: 20 mL
  Administered 2023-05-07: 5 mL

## 2023-05-07 MED ORDER — EPHEDRINE 5 MG/ML INJ
INTRAVENOUS | Status: AC
  Filled 2023-05-07: qty 5

## 2023-05-07 MED ORDER — PHENYLEPHRINE 80 MCG/ML (10ML) SYRINGE FOR IV PUSH (FOR BLOOD PRESSURE SUPPORT)
PREFILLED_SYRINGE | INTRAVENOUS | Status: AC
  Filled 2023-05-07: qty 20

## 2023-05-07 MED ORDER — DEXAMETHASONE SODIUM PHOSPHATE 10 MG/ML IJ SOLN
INTRAMUSCULAR | Status: DC | PRN
  Administered 2023-05-07: 5 mg via INTRAVENOUS

## 2023-05-07 MED ORDER — FLEET ENEMA 7-19 GM/118ML RE ENEM: 1.0000 | ENEMA | Freq: Once | RECTAL | Status: DC | PRN

## 2023-05-07 MED ORDER — GABAPENTIN 400 MG PO CAPS
800.0000 mg | ORAL_CAPSULE | Freq: Two times a day (BID) | ORAL | Status: DC
  Administered 2023-05-07 – 2023-05-11 (×8): 800 mg via ORAL
  Filled 2023-05-07 (×8): qty 2

## 2023-05-07 MED ORDER — OMEPRAZOLE MAGNESIUM 20 MG PO TBEC: 20.0000 mg | DELAYED_RELEASE_TABLET | ORAL | Status: DC

## 2023-05-07 MED ORDER — LEVOTHYROXINE SODIUM 100 MCG PO TABS
100.0000 ug | ORAL_TABLET | Freq: Every day | ORAL | Status: DC
  Administered 2023-05-07 – 2023-05-10 (×4): 100 ug via ORAL
  Filled 2023-05-07 (×4): qty 1

## 2023-05-07 MED ORDER — ALBUMIN HUMAN 5 % IV SOLN: INTRAVENOUS | Status: DC | PRN

## 2023-05-07 MED ORDER — MEPERIDINE HCL 25 MG/ML IJ SOLN: 6.2500 mg | INTRAMUSCULAR | Status: DC | PRN

## 2023-05-07 MED ORDER — CHLORHEXIDINE GLUCONATE 0.12 % MT SOLN
15.0000 mL | Freq: Once | OROMUCOSAL | Status: AC
  Administered 2023-05-07: 15 mL via OROMUCOSAL
  Filled 2023-05-07: qty 15

## 2023-05-07 MED ORDER — ROCURONIUM BROMIDE 10 MG/ML (PF) SYRINGE
PREFILLED_SYRINGE | INTRAVENOUS | Status: DC | PRN
  Administered 2023-05-07 (×2): 10 mg via INTRAVENOUS
  Administered 2023-05-07: 20 mg via INTRAVENOUS
  Administered 2023-05-07: 60 mg via INTRAVENOUS
  Administered 2023-05-07 (×2): 20 mg via INTRAVENOUS

## 2023-05-07 MED ORDER — HYDROMORPHONE HCL 1 MG/ML IJ SOLN
INTRAMUSCULAR | Status: DC | PRN
  Administered 2023-05-07: .5 mg via INTRAVENOUS

## 2023-05-07 MED ORDER — AMISULPRIDE (ANTIEMETIC) 5 MG/2ML IV SOLN
INTRAVENOUS | Status: AC
  Filled 2023-05-07: qty 4

## 2023-05-07 MED ORDER — ONDANSETRON HCL 4 MG PO TABS: 4.0000 mg | ORAL_TABLET | Freq: Four times a day (QID) | ORAL | Status: DC | PRN

## 2023-05-07 MED ORDER — LACTATED RINGERS IV SOLN: INTRAVENOUS | Status: DC

## 2023-05-07 MED ORDER — HYDROMORPHONE HCL 1 MG/ML IJ SOLN: 0.2500 mg | INTRAMUSCULAR | Status: DC | PRN

## 2023-05-07 MED ORDER — AMISULPRIDE (ANTIEMETIC) 5 MG/2ML IV SOLN
10.0000 mg | Freq: Once | INTRAVENOUS | Status: AC | PRN
  Administered 2023-05-07: 10 mg via INTRAVENOUS

## 2023-05-07 MED ORDER — ONDANSETRON HCL 4 MG/2ML IJ SOLN: 4.0000 mg | Freq: Four times a day (QID) | INTRAMUSCULAR | Status: DC | PRN

## 2023-05-07 MED ORDER — BUPIVACAINE HCL (PF) 0.5 % IJ SOLN
INTRAMUSCULAR | Status: AC
  Filled 2023-05-07: qty 30

## 2023-05-07 MED ORDER — OXYCODONE HCL 5 MG PO TABS
5.0000 mg | ORAL_TABLET | ORAL | Status: DC | PRN
  Administered 2023-05-07 – 2023-05-11 (×15): 10 mg via ORAL
  Filled 2023-05-07 (×20): qty 2

## 2023-05-07 MED ORDER — PROPOFOL 10 MG/ML IV BOLUS
INTRAVENOUS | Status: DC | PRN
  Administered 2023-05-07: 100 mg via INTRAVENOUS

## 2023-05-07 MED ORDER — POLYETHYL GLYCOL-PROPYL GLYCOL 0.4-0.3 % OP SOLN: 1.0000 [drp] | Freq: Three times a day (TID) | OPHTHALMIC | Status: DC | PRN

## 2023-05-07 MED ORDER — SODIUM CHLORIDE 0.9% FLUSH
3.0000 mL | Freq: Two times a day (BID) | INTRAVENOUS | Status: DC
  Administered 2023-05-07 – 2023-05-11 (×6): 3 mL via INTRAVENOUS

## 2023-05-07 MED ORDER — CEFAZOLIN SODIUM-DEXTROSE 2-4 GM/100ML-% IV SOLN
2.0000 g | INTRAVENOUS | Status: AC
  Administered 2023-05-07 (×2): 2 g via INTRAVENOUS
  Filled 2023-05-07: qty 100

## 2023-05-07 MED ORDER — MORPHINE SULFATE (PF) 2 MG/ML IV SOLN
2.0000 mg | INTRAVENOUS | Status: DC | PRN
  Filled 2023-05-07: qty 1

## 2023-05-07 MED ORDER — THROMBIN 5000 UNITS EX SOLR
CUTANEOUS | Status: AC
  Filled 2023-05-07: qty 5000

## 2023-05-07 MED ORDER — FERROUS SULFATE 325 (65 FE) MG PO TABS
324.0000 mg | ORAL_TABLET | Freq: Every day | ORAL | Status: DC
  Administered 2023-05-07 – 2023-05-10 (×4): 324 mg via ORAL
  Filled 2023-05-07 (×4): qty 1

## 2023-05-07 MED ORDER — ROCURONIUM BROMIDE 10 MG/ML (PF) SYRINGE
PREFILLED_SYRINGE | INTRAVENOUS | Status: AC
  Filled 2023-05-07: qty 40

## 2023-05-07 MED ORDER — METHOCARBAMOL 1000 MG/10ML IJ SOLN: 500.0000 mg | Freq: Four times a day (QID) | INTRAVENOUS | Status: DC | PRN

## 2023-05-07 MED ORDER — ONDANSETRON HCL 4 MG/2ML IJ SOLN
INTRAMUSCULAR | Status: DC | PRN
  Administered 2023-05-07: 4 mg via INTRAVENOUS

## 2023-05-07 MED ORDER — DOCUSATE SODIUM 100 MG PO CAPS
100.0000 mg | ORAL_CAPSULE | Freq: Two times a day (BID) | ORAL | Status: DC
  Administered 2023-05-07 – 2023-05-11 (×8): 100 mg via ORAL
  Filled 2023-05-07 (×8): qty 1

## 2023-05-07 MED ORDER — DULOXETINE HCL 20 MG PO CPEP
20.0000 mg | ORAL_CAPSULE | Freq: Every day | ORAL | Status: DC
  Administered 2023-05-07 – 2023-05-10 (×4): 20 mg via ORAL
  Filled 2023-05-07 (×4): qty 1

## 2023-05-07 MED ORDER — ACETAMINOPHEN 650 MG RE SUPP: 650.0000 mg | RECTAL | Status: DC | PRN

## 2023-05-07 MED ORDER — LIDOCAINE 2% (20 MG/ML) 5 ML SYRINGE
INTRAMUSCULAR | Status: DC | PRN
  Administered 2023-05-07: 80 mg via INTRAVENOUS

## 2023-05-07 MED ORDER — LIDOCAINE-EPINEPHRINE 1 %-1:100000 IJ SOLN
INTRAMUSCULAR | Status: AC
  Filled 2023-05-07: qty 1

## 2023-05-07 MED ORDER — POLYETHYLENE GLYCOL 3350 17 G PO PACK: 17.0000 g | PACK | Freq: Every day | ORAL | Status: DC | PRN

## 2023-05-07 MED ORDER — METHOCARBAMOL 500 MG PO TABS
500.0000 mg | ORAL_TABLET | Freq: Four times a day (QID) | ORAL | Status: DC | PRN
  Administered 2023-05-08 – 2023-05-11 (×7): 500 mg via ORAL
  Filled 2023-05-07 (×7): qty 1

## 2023-05-07 MED ORDER — ACETAMINOPHEN 500 MG PO TABS: 500.0000 mg | ORAL_TABLET | Freq: Four times a day (QID) | ORAL | Status: DC | PRN

## 2023-05-07 MED ORDER — ORAL CARE MOUTH RINSE: 15.0000 mL | Freq: Once | OROMUCOSAL | Status: AC

## 2023-05-07 MED ORDER — MENTHOL 3 MG MT LOZG: 1.0000 | LOZENGE | OROMUCOSAL | Status: DC | PRN

## 2023-05-07 MED ORDER — ACETAMINOPHEN 500 MG PO TABS
1000.0000 mg | ORAL_TABLET | Freq: Once | ORAL | Status: AC
  Administered 2023-05-07: 1000 mg via ORAL
  Filled 2023-05-07: qty 2

## 2023-05-07 SURGICAL SUPPLY — 64 items
ADH SKN CLS APL DERMABOND .7 (GAUZE/BANDAGES/DRESSINGS) ×1
BAG COUNTER SPONGE SURGICOUNT (BAG) ×1 IMPLANT
BAG SPNG CNTER NS LX DISP (BAG) ×2
BASKET BONE COLLECTION (BASKET) ×1 IMPLANT
BLADE BONE MILL MEDIUM (MISCELLANEOUS) ×1 IMPLANT
BONE CANC CHIPS 20CC PCAN1/4 (Bone Implant) ×1 IMPLANT
BUR MATCHSTICK NEURO 3.0 LAGG (BURR) ×1 IMPLANT
CAGE COROENT LG 10X9X23-12 (Cage) IMPLANT
CAGE COROENT PLIF 10X28-8 LUMB (Cage) IMPLANT
CANISTER SUCT 3000ML PPV (MISCELLANEOUS) ×1 IMPLANT
CHIPS CANC BONE 20CC PCAN1/4 (Bone Implant) ×1 IMPLANT
CNTNR URN SCR LID CUP LEK RST (MISCELLANEOUS) ×1 IMPLANT
CONT SPEC 4OZ STRL OR WHT (MISCELLANEOUS) ×1
COVER BACK TABLE 60X90IN (DRAPES) ×1 IMPLANT
DERMABOND ADVANCED .7 DNX12 (GAUZE/BANDAGES/DRESSINGS) ×1 IMPLANT
DEVICE DISSECT PLASMABLAD 3.0S (MISCELLANEOUS) ×1 IMPLANT
DRAPE C-ARM 42X72 X-RAY (DRAPES) ×2 IMPLANT
DRAPE HALF SHEET 40X57 (DRAPES) IMPLANT
DRAPE LAPAROTOMY 100X72X124 (DRAPES) ×1 IMPLANT
DURAPREP 26ML APPLICATOR (WOUND CARE) ×1 IMPLANT
ELECT REM PT RETURN 9FT ADLT (ELECTROSURGICAL) ×1
ELECTRODE REM PT RTRN 9FT ADLT (ELECTROSURGICAL) ×1 IMPLANT
GAUZE 4X4 16PLY ~~LOC~~+RFID DBL (SPONGE) IMPLANT
GAUZE SPONGE 4X4 12PLY STRL (GAUZE/BANDAGES/DRESSINGS) ×1 IMPLANT
GLOVE BIOGEL PI IND STRL 8.5 (GLOVE) ×2 IMPLANT
GLOVE ECLIPSE 8.5 STRL (GLOVE) ×2 IMPLANT
GOWN STRL REUS W/ TWL LRG LVL3 (GOWN DISPOSABLE) IMPLANT
GOWN STRL REUS W/ TWL XL LVL3 (GOWN DISPOSABLE) IMPLANT
GOWN STRL REUS W/TWL 2XL LVL3 (GOWN DISPOSABLE) ×2 IMPLANT
GOWN STRL REUS W/TWL LRG LVL3 (GOWN DISPOSABLE)
GOWN STRL REUS W/TWL XL LVL3 (GOWN DISPOSABLE)
GRAFT BNE CANC CHIPS 1-8 20CC (Bone Implant) IMPLANT
GRAFT BONE PROTEIOS XL 10CC (Orthopedic Implant) IMPLANT
HEMOSTAT POWDER KIT SURGIFOAM (HEMOSTASIS) ×1 IMPLANT
KIT BASIN OR (CUSTOM PROCEDURE TRAY) ×1 IMPLANT
KIT TURNOVER KIT B (KITS) ×1 IMPLANT
MILL BONE PREP (MISCELLANEOUS) ×1 IMPLANT
NDL SPNL 18GX3.5 QUINCKE PK (NEEDLE) IMPLANT
NEEDLE HYPO 22GX1.5 SAFETY (NEEDLE) ×1 IMPLANT
NEEDLE SPNL 18GX3.5 QUINCKE PK (NEEDLE) IMPLANT
NS IRRIG 1000ML POUR BTL (IV SOLUTION) ×1 IMPLANT
PACK LAMINECTOMY NEURO (CUSTOM PROCEDURE TRAY) ×1 IMPLANT
PAD ARMBOARD 7.5X6 YLW CONV (MISCELLANEOUS) ×3 IMPLANT
PATTIES SURGICAL .5 X1 (DISPOSABLE) ×1 IMPLANT
PLASMABLADE 3.0S (MISCELLANEOUS) ×1
ROD RELIN-O LORD 5.5X65MM (Rod) IMPLANT
SCREW LOCK RELINE 5.5 TULIP (Screw) IMPLANT
SCREW RELINE-O POLY 6.5X45 (Screw) IMPLANT
SCREW RELINE-O POLY 6.5X50MM (Screw) IMPLANT
SPIKE FLUID TRANSFER (MISCELLANEOUS) ×1 IMPLANT
SPONGE SURGIFOAM ABS GEL 100 (HEMOSTASIS) IMPLANT
SPONGE T-LAP 4X18 ~~LOC~~+RFID (SPONGE) IMPLANT
SUT VIC AB 1 CT1 18XBRD ANBCTR (SUTURE) ×1 IMPLANT
SUT VIC AB 1 CT1 8-18 (SUTURE) ×1
SUT VIC AB 2-0 CP2 18 (SUTURE) ×1 IMPLANT
SUT VIC AB 3-0 SH 8-18 (SUTURE) ×1 IMPLANT
SUT VIC AB 4-0 RB1 18 (SUTURE) ×1 IMPLANT
SYR 3ML LL SCALE MARK (SYRINGE) ×4 IMPLANT
SYR 5ML LL (SYRINGE) IMPLANT
TAPE CLOTH SURG 4X10 WHT LF (GAUZE/BANDAGES/DRESSINGS) IMPLANT
TOWEL GREEN STERILE (TOWEL DISPOSABLE) ×1 IMPLANT
TOWEL GREEN STERILE FF (TOWEL DISPOSABLE) ×1 IMPLANT
TRAY FOLEY MTR SLVR 16FR STAT (SET/KITS/TRAYS/PACK) ×1 IMPLANT
WATER STERILE IRR 1000ML POUR (IV SOLUTION) ×1 IMPLANT

## 2023-05-07 NOTE — Transfer of Care (Signed)
Immediate Anesthesia Transfer of Care Note  Patient: James Barber  Procedure(s) Performed: LUMBAR FOUR-FIVE, LUMBAR FIVE-SACRAL ONE POSTERIOR LUMBAR INTERBODY FUSION (Spine Lumbar)  Patient Location: PACU  Anesthesia Type:General  Level of Consciousness: drowsy and patient cooperative  Airway & Oxygen Therapy: Patient Spontanous Breathing and Patient connected to nasal cannula oxygen  Post-op Assessment: Report given to RN, Post -op Vital signs reviewed and stable, and Patient moving all extremities X 4  Post vital signs: Reviewed and stable  Last Vitals:  Vitals Value Taken Time  BP 113/63 05/07/23 1425  Temp    Pulse 88 05/07/23 1427  Resp 14   SpO2 96 % 05/07/23 1427  Vitals shown include unvalidated device data.  Last Pain:  Vitals:   05/07/23 1610  PainSc: 6          Complications: No notable events documented.

## 2023-05-07 NOTE — H&P (Addendum)
James Barber is an 77 y.o. male.   Chief Complaint: Right lower extremity pain HPI: Patient is a 77 year old individual who initially presented with a right lumbar radiculopathy at the L5-S1 level.  He underwent surgical decompression did not help his right lumbar radicular pain subsequent workup demonstrated right foraminal stenosis and extraforaminal decompression was performed.  Ultimately the patient has had a chronic right lumbar radiculopathy and he has been advised regarding the need for surgical decompression and arthrodesis of the L4-5 and L5-S1 segment.  Patient has a degenerative spondylolisthesis and has had previous laminectomies at L4-5.  This is now to be performed.  Past Medical History:  Diagnosis Date   Bowel obstruction (HCC)    Cancer (HCC)    prostate- "everything removed and clear"   Complication of anesthesia    Hard to wake and spikes fever   Diabetes mellitus without complication (HCC)    GERD (gastroesophageal reflux disease)    pt has not had issues with this in "a long time"   History of pulmonary embolus (PE)    Hypertension    Hypothyroidism    Ischemic stroke (HCC) 12/2022   Sleep apnea     Past Surgical History:  Procedure Laterality Date   ABDOMINAL SURGERY     "had ileostomy for about 4 months and had it reversed- due to salmonella infection- [place]   ANTERIOR CERVICAL DECOMP/DISCECTOMY FUSION  11/12/2012   Procedure: ANTERIOR CERVICAL DECOMPRESSION/DISCECTOMY FUSION 2 LEVELS;  Surgeon: Barnett Abu, MD;  Location: [facility];  Service: Neurosurgery;  Laterality: N/A;  Right Side approach Cervical six-seven,Cervical seven -thoracic one Anterior cervical decompression/diskectomy/fusion   BACK SURGERY  2023   x2 (outpatient with Dr. Danielle Dess)   CARPAL TUNNEL RELEASE Bilateral    CERVICAL FUSION     c 3-4, C 4-5, C5-6   HERNIA REPAIR     right inguinal   LAPAROTOMY N/A 03/22/2015   Procedure: DIAGONOSTIC LAPAROSCOPY, LAPROSCOPIC  LYSIS OF ADHESIONS FOR ONE HOUR, DISTAL SMALL BOWEL RESECTION;  Surgeon: [name], MD;  Location: [facility] ORS;  Service: General;  Laterality: N/A;    History reviewed. No pertinent family history. Social History:  reports that he has never smoked. He has never used smokeless tobacco. He reports that he does not drink alcohol and does not use drugs.  Allergies: No Known Allergies  Medications Prior to Admission  Medication Sig Dispense Refill   acetaminophen (TYLENOL) 500 MG tablet Take 500-1,000 mg by mouth every 6 (six) hours as needed (pain.).     aspirin EC 81 MG tablet Take 81 mg by mouth at bedtime.     atorvastatin (LIPITOR) 80 MG tablet Take 80 mg by mouth at bedtime.     brimonidine (ALPHAGAN) 0.2 % ophthalmic solution Place 1 drop into the left eye in the morning and at bedtime.     DULoxetine (CYMBALTA) 20 MG capsule Take 20 mg by mouth at bedtime.     empagliflozin (JARDIANCE) 25 MG TABS tablet Take 25 mg by mouth in the morning.     ferrous sulfate 324 MG TBEC Take 324 mg by mouth at bedtime.     gabapentin (NEURONTIN) 800 MG tablet Take 1,600 mg by mouth in the morning and at bedtime.     glipiZIDE (GLUCOTROL XL) 10 MG 24 hr tablet Take 10 mg by mouth at bedtime.     levothyroxine (SYNTHROID) 100 MCG tablet Take 100 mcg by mouth at bedtime.     lisinopril (PRINIVIL,ZESTRIL)  20 MG tablet Resume one daily for systolic blood pressure 120 or greater.  If less call PCP and ask for recommendations.     Oxycodone HCl 10 MG TABS Take 5-10 mg by mouth See admin instructions. Take 1 tablet (10 mg) by mouth scheduled every morning & may take 0.5 tablet (5 mg) by mouth in the afternoon/evening if needed for pain.     Polyethyl Glycol-Propyl Glycol (SYSTANE ULTRA) 0.4-0.3 % SOLN Place 1-2 drops into both eyes 3 (three) times daily as needed (dry/irritated eyes.).     rivaroxaban (XARELTO) 10 MG TABS tablet Take 10 mg by mouth every evening.     tiZANidine (ZANAFLEX) 4 MG tablet Take 4 mg by  mouth at bedtime.     omeprazole (PRILOSEC OTC) 20 MG tablet Take 20 mg by mouth 2 (two) times a week. Sundays & Wednesdays ONLY      Results for orders placed or performed during the hospital encounter of 05/07/23 (from the past 48 hour(s))  Glucose, capillary     Status: Abnormal   Collection Time: 05/07/23  6:35 AM  Result Value Ref Range   Glucose-Capillary 131 (H) 70 - 99 mg/dL    Comment: Glucose reference range applies only to samples taken after fasting for at least 8 hours.   No results found.  Review of Systems  Constitutional:  Positive for activity change.  Musculoskeletal:  Positive for back pain and gait problem.  Neurological:  Positive for weakness and numbness.  Psychiatric/Behavioral: Negative.    All other systems reviewed and are negative.   Blood pressure 128/72, pulse 61, temperature 98.2 F (36.8 C), resp. rate 17, height 6' (1.829 m), weight 83 kg, SpO2 92 %. Physical Exam Constitutional:      Appearance: Normal appearance. He is normal weight.  HENT:     Head: Normocephalic and atraumatic.     Right Ear: Tympanic membrane, ear canal and external ear normal.     Left Ear: Tympanic membrane, ear canal and external ear normal.     Nose: Nose normal.     Mouth/Throat:     Mouth: Mucous membranes are moist.     Pharynx: Oropharynx is clear.  Eyes:     Extraocular Movements: Extraocular movements intact.     Conjunctiva/sclera: Conjunctivae normal.     Pupils: Pupils are equal, round, and reactive to light.  Cardiovascular:     Rate and Rhythm: Normal rate and regular rhythm.     Pulses: Normal pulses.     Heart sounds: Normal heart sounds.  Pulmonary:     Effort: Pulmonary effort is normal.     Breath sounds: Normal breath sounds.  Abdominal:     General: Abdomen is flat. Bowel sounds are normal.     Palpations: Abdomen is soft.  Musculoskeletal:        General: Normal range of motion.     Cervical back: Normal range of motion and neck supple.   Skin:    General: Skin is warm and dry.     Capillary Refill: Capillary refill takes less than 2 seconds.  Neurological:     Mental Status: He is alert.     Comments: Mild weakness in the tibialis anterior on the right.  Gastroc on the right has absent reflex.  Otherwise motor strength is intact in the left lower extremity upper extremities and cranial nerves are normal.  Psychiatric:        Mood and Affect: Mood normal.  Behavior: Behavior normal.        Thought Content: Thought content normal.        Judgment: Judgment normal.      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  Plan posterior decompression arthrodesis L4-5 and L5-S1.  Stefani Dama, MD 05/07/2023, 7:40 AM

## 2023-05-07 NOTE — Op Note (Signed)
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 Postoperative diagnosis: Same Procedure: Bilateral decompression L4-5 L5-S1 with her work than required for simple interbody technique.  Posterior lumbar interbody arthrodesis L4-5 and L5-S1 with pedicle screw fixation L4 to sacrum posterolateral arthrodesis with local autograft allograft and Proteos. Surgeon: Barnett Abu First Assistant: Donalee Citrin MD Indications: [name] is a 77 year old individual has had previous disc herniation at the L5-S1 level he underwent surgical decompression and achieved brief relief but then the pain soon returned he was found to have a significant disc recurrence repeat decompression was performed and this did not yield substantial relief he underwent extraforaminal decompression as he lost height in the disc space was also noted that he was involving a spondylolisthesis at L4-L5 4 years ago he had a decompression.  Patient was having severe and none refractory right leg pain he was advised regarding the need for surgical decompression and stabilization L4-5 and L5-S1.  He is now taken to the operating room for this procedure.  Procedure: The patient was brought to the operating room supine on the stretcher.  After the smooth induction of general endotracheal anesthesia he was carefully turned prone.  The back was prepped with alcohol DuraPrep and draped in a sterile fashion.  Midline incision was created through the old incision from the laminectomy and dissection was carried down to the L4-5 and L5-S1 spaces.  A subperiosteal dissection was performed out over the facets at L4-5 and L5-S1.  Then the laminectomy was created removing the inferior margin lamina of L4 out to and including the entirety of the facets at L4-5.  The entire laminar arch of L5 was then removed and this allowed good access  to and decompression of the common dural tube the L4 nerve root superiorly the L5 nerve roots immediately inferiorly and the S1 nerve roots.  At L5-S1 there is noted to be significant recurrent disc herniation both centrally and foraminally this was decompressed to allow good decompression of the L5 and S1 particularly.  At L4-5 the disc was noted to be severely degenerated and removal of it yielded good decompression the L4 nerve roots.  Then total discectomies were performed at L4-5 and L5-S1 with the endplates being curettaged.  There is during this part of the procedure that Dr. Wynetta Emery provided invaluable help with retraction of the dura protection of the nerve roots and also facilitating the discectomy on either side.  Once the discectomies were completed the interbody spaces were sized for appropriate sized spacer and it was felt a 10 x 9 x 23 mm spacer with 12 degrees lordosis would fit best at L5-S1 and a 10 x 9 x 23 spacer with 8 degrees lordosis would fit best at L4-5 spacers were filled with combination of autograft allograft and Prody os.  A total of 18 cc of bone graft was packed into the L5-S1 total of 20 cc of bone graft was packed into L4-L5.  Once the interbody fusion was completed attention was turned to placing pedicle screws at L4-L5 and the sacrum.  This was done individually and sequentially using pedicle probes and fluoroscopic guidance to place a 6.5 x 45 mm screws at L4 and L5 and 6.5 x 50 mm screws and S1.  The screws were then connected with precontoured rods measuring 65 mm in length.  Lateral gutters were packed with the residual bone which only amounted to 6 cc in each  lateral gutter from L4 to the sacrum.  This hemostasis was obtained meticulously.  It should be noted that during the entirety of the case the patient oozed substantially as he had been on Xarelto.  Total blood loss for the procedure was estimated at 1600 cc patient received 800 cc of Cell Saver blood return.  Follow-up  hematocrit will be obtained postoperatively.  Patient was returned to recovery room after closing midline incision with #1 Vicryl and the lumbodorsal fascia 2-0 Vicryl in the subcutaneous tissues 3-0 Vicryl subcuticularly.  A dry sterile dressing was applied to the back.  25 cc of half percent Marcaine was injected into the paraspinous fascia and epidural space.

## 2023-05-07 NOTE — Anesthesia Procedure Notes (Signed)
Procedure Name: Intubation Date/Time: 05/07/2023 8:08 AM  Performed by: Alease Medina, CRNAPre-anesthesia Checklist: Patient identified, Emergency Drugs available, Suction available and Patient being monitored Patient Re-evaluated:Patient Re-evaluated prior to induction Oxygen Delivery Method: Circle system utilized Preoxygenation: Pre-oxygenation with 100% oxygen Induction Type: IV induction Ventilation: Mask ventilation without difficulty Laryngoscope Size: Mac and 4 Grade View: Grade I Tube type: Oral Tube size: 7.5 mm Number of attempts: 1 Airway Equipment and Method: Stylet and Oral airway Placement Confirmation: ETT inserted through vocal cords under direct vision, positive ETCO2 and breath sounds checked- equal and bilateral Secured at: 22 cm Tube secured with: Tape Dental Injury: Teeth and Oropharynx as per pre-operative assessment

## 2023-05-08 LAB — BASIC METABOLIC PANEL
Anion gap: 9 (ref 5–15)
Anion gap: 7 (ref 5–15)
BUN: 21 mg/dL (ref 8–23)
BUN: 21 mg/dL (ref 8–23)
CO2: 22 mmol/L (ref 22–32)
CO2: 26 mmol/L (ref 22–32)
Calcium: 7.7 mg/dL — ABNORMAL LOW (ref 8.9–10.3)
Calcium: 8.3 mg/dL — ABNORMAL LOW (ref 8.9–10.3)
Chloride: 100 mmol/L (ref 98–111)
Chloride: 101 mmol/L (ref 98–111)
Creatinine, Ser: 1.58 mg/dL — ABNORMAL HIGH (ref 0.61–1.24)
Creatinine, Ser: 1.28 mg/dL — ABNORMAL HIGH (ref 0.61–1.24)
GFR, Estimated: 46 mL/min — ABNORMAL LOW (ref >60)
GFR, Estimated: 59 mL/min — ABNORMAL LOW (ref >60)
Glucose, Bld: 212 mg/dL — ABNORMAL HIGH (ref 70–99)
Glucose, Bld: 133 mg/dL — ABNORMAL HIGH (ref 70–99)
Potassium: 4 mmol/L (ref 3.5–5.1)
Potassium: 3.6 mmol/L (ref 3.5–5.1)
Sodium: 131 mmol/L — ABNORMAL LOW (ref 135–145)
Sodium: 134 mmol/L — ABNORMAL LOW (ref 135–145)

## 2023-05-08 LAB — CBC
HCT: 27.4 % — ABNORMAL LOW (ref 39.0–52.0)
Hemoglobin: 9.2 g/dL — ABNORMAL LOW (ref 13.0–17.0)
MCH: 33.2 pg (ref 26.0–34.0)
MCHC: 33.6 g/dL (ref 30.0–36.0)
MCV: 98.9 fL (ref 80.0–100.0)
Platelets: 143 10*3/uL — ABNORMAL LOW (ref 150–400)
RBC: 2.77 MIL/uL — ABNORMAL LOW (ref 4.22–5.81)
RDW: 14.1 % (ref 11.5–15.5)
WBC: 8.9 10*3/uL (ref 4.0–10.5)
nRBC: 0 % (ref 0.0–0.2)

## 2023-05-08 LAB — GLUCOSE, CAPILLARY
Glucose-Capillary: 106 mg/dL — ABNORMAL HIGH (ref 70–99)
Glucose-Capillary: 122 mg/dL — ABNORMAL HIGH (ref 70–99)

## 2023-05-08 MED ORDER — HYDROCODONE-ACETAMINOPHEN 5-325 MG PO TABS
1.0000 | ORAL_TABLET | Freq: Once | ORAL | Status: AC
  Administered 2023-05-08: 1 via ORAL
  Filled 2023-05-08: qty 1

## 2023-05-08 MED ORDER — SODIUM CHLORIDE 0.9 % IV BOLUS
1000.0000 mL | Freq: Once | INTRAVENOUS | Status: AC
  Administered 2023-05-08: 1000 mL via INTRAVENOUS

## 2023-05-08 MED ORDER — INSULIN ASPART 100 UNIT/ML IJ SOLN
0.0000 [IU] | Freq: Three times a day (TID) | INTRAMUSCULAR | Status: DC
  Administered 2023-05-11: 3 [IU] via SUBCUTANEOUS

## 2023-05-08 MED FILL — Thrombin For Soln 5000 Unit: CUTANEOUS | Qty: 2 | Status: AC

## 2023-05-08 NOTE — Progress Notes (Signed)
Patient unable to void, got a verbal order for foley's catheterization and one time dose of hydrocodone 5-325 mg for pain from neurosurgery on call provider Dahlia Client.

## 2023-05-08 NOTE — Progress Notes (Signed)
No post op urine output within 6 hours. Bladder scanned with urine of 457  mL, intermittent catheterization done with output of 500 mL and 0 mL residual.

## 2023-05-08 NOTE — Progress Notes (Signed)
   05/08/23 0058  Assess: MEWS Score  Temp 98.4 F (36.9 C)  BP (!) 58/35  MAP (mmHg) (!) 44  Pulse Rate 73  Resp 18  Level of Consciousness Alert  SpO2 93 %  O2 Device Room Air  Assess: MEWS Score  MEWS Temp 0  MEWS Systolic 3  MEWS Pulse 0  MEWS RR 0  MEWS LOC 0  MEWS Score 3  MEWS Score Color Yellow  Assess: if the MEWS score is Yellow or Red  Were vital signs taken at a resting state? Yes  Focused Assessment Change from prior assessment (see assessment flowsheet)  Does the patient meet 2 or more of the SIRS criteria? No  MEWS guidelines implemented  Yes, yellow  Treat  MEWS Interventions Considered administering scheduled or prn medications/treatments as ordered  Take Vital Signs  Increase Vital Sign Frequency  Yellow: Q2hr x1, continue Q4hrs until patient remains green for 12hrs  Escalate  MEWS: Escalate Yellow: Discuss with charge nurse and consider notifying provider and/or RRT  Notify: Charge Nurse/RN  Name of Charge Nurse/RN Hulen Shouts, RN  Provider Notification  Provider Name/Title Dahlia Client  Date Provider Notified 05/08/23  Time Provider Notified 0104  Method of Notification Call  Notification Reason Other (Comment) (low blood pressure)  Provider response See new orders;Other (Comment) (verbal order)  Date of Provider Response 05/08/23  Time of Provider Response 0108  Assess: SIRS CRITERIA  SIRS Temperature  0  SIRS Pulse 0  SIRS Respirations  0  SIRS WBC 1  SIRS Score Sum  1

## 2023-05-08 NOTE — Evaluation (Signed)
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   Clinical Impression   Pt s/p above mentioned surgery. Pt has significant pain at rest, worse with activities, 8/10. Pt PLOF mod I at home with cane, lives with wife. Post surgery Pt requires significant assistance for all activities, max A sit to stand from standard height, min A from elevated surface, good overall safety awareness. Pt has 3 steps to enter home, standard height bed. Pt would benefit greatly from continued skilled therapy, and post acute follow up > 3hrs/day to maximize strength/function prior to return home, Pt open to AIR if needed, but if progresses with stairs during stay may prefer HH follow up.      Recommendations for follow up therapy are one component of a multi-disciplinary discharge planning process, led by the attending physician.  Recommendations may be updated based on patient status, additional functional criteria and insurance authorization.   Assistance Recommended at Discharge Frequent or constant Supervision/Assistance  Patient can return home with the following A lot of help with walking and/or transfers;A lot of help with bathing/dressing/bathroom;Assistance with cooking/housework;Assist for transportation;Help with stairs or ramp for entrance    Functional Status Assessment  Patient has had a recent decline in their functional status and demonstrates the ability to make significant improvements in function in a reasonable and predictable amount of time.  Equipment Recommendations  Other (comment) (RW)     Recommendations for Other Services       Precautions / Restrictions Precautions Precautions: Back Precaution Booklet Issued: No Precaution Comments: has lumbar corsett, able to go to toilet and shower without wearing Required Braces or Orthoses: Spinal Brace Spinal Brace: Lumbar corset Restrictions Weight Bearing Restrictions: No      Mobility Bed Mobility Overal bed mobility: Needs Assistance Bed Mobility: Supine to Sit, Sit to Supine     Supine to sit: Mod assist, HOB elevated Sit to supine: Mod assist, HOB elevated   General bed mobility comments: signfiicant assistance with LB in/out of bed, instructed on log rolling    Transfers Overall transfer level: Needs assistance Equipment used: Rolling walker (2 wheels) Transfers: Sit to/from Stand, Bed to chair/wheelchair/BSC Sit to Stand: Min assist, From elevated surface     Step pivot transfers: Min assist, From elevated surface     General transfer comment: requires elevated surfaces, unable to stand from standard height      Balance Overall balance assessment: Needs assistance Sitting-balance support: Feet supported, Single extremity supported Sitting balance-Leahy Scale: Fair Sitting balance - Comments: sitting EOB   Standing balance support: Bilateral upper extremity supported, During functional activity, Reliant on assistive device for balance Standing balance-Leahy Scale: Poor Standing balance comment: reliant on RW for balance                           ADL either performed or assessed with clinical judgement   ADL Overall ADL's : Needs assistance/impaired Eating/Feeding: Set up   Grooming: Set up;Sitting;Bed level   Upper Body Bathing: Minimal assistance   Lower Body Bathing: Maximal assistance;Sitting/lateral leans   Upper Body Dressing : Minimal assistance;Sitting   Lower Body Dressing: Maximal assistance  Toilet Transfer: Minimal assistance;Cueing for safety;Comfort height  toilet;Rolling walker (2 wheels)   Toileting- Clothing Manipulation and Hygiene: Total assistance;Sit to/from stand       Functional mobility during ADLs: Minimal assistance;Rolling walker (2 wheels) General ADL Comments: requires significant assistance for LB ADLs, unable  to stand from standard height, min A from elevated surfaces     Vision Baseline Vision/History: 1 Wears glasses;3 Glaucoma Ability to See in Adequate Light: 1 Impaired Patient Visual Report: Other (comment) (R side vision loss, L glaucoma, reading glasses)       Perception     Praxis      Pertinent Vitals/Pain Pain Assessment Pain Assessment: 0-10 Pain Score: 8  Pain Location: lower back Pain Descriptors / Indicators: Aching, Constant, Guarding Pain Intervention(s): Limited activity within patient's tolerance, Monitored during session     Hand Dominance Right   Extremity/Trunk Assessment Upper Extremity Assessment Upper Extremity Assessment: RUE deficits/detail RUE Deficits / Details: R handed weakness, R shoudler pain, decreased AROM, 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 RUE: Shoulder pain with ROM RUE Sensation: history of peripheral neuropathy RUE Coordination: decreased fine motor;decreased gross motor   Lower Extremity Assessment Lower Extremity Assessment: Defer to PT evaluation       Communication Communication Communication: No difficulties   Cognition Arousal/Alertness: Awake/alert Behavior During Therapy: WFL for tasks assessed/performed Overall Cognitive Status: Within Functional Limits for tasks assessed                                       General Comments       Exercises     Shoulder Instructions      Home Living Family/patient expects to be discharged to:: Private residence Living Arrangements: Spouse/significant other;Parent Available Help at Discharge: Family Type of Home: House Home Access: Stairs to enter Secretary/administrator of  Steps: 3 Entrance Stairs-Rails: Can reach both Home Layout: Able to live on main level with bedroom/bathroom     Bathroom Shower/Tub: Chief Strategy Officer: Handicapped height Bathroom Accessibility: Yes How Accessible: Accessible via walker Home Equipment: Cane - single point;Shower seat;Grab bars - toilet;Grab bars - tub/shower   Additional Comments: Pt lives at home with wife      Prior Functioning/Environment Prior Level of Function : Independent/Modified Independent             Mobility Comments: uses cane at baseline ADLs Comments: mod I        OT Problem List: Decreased strength;Decreased range of motion;Decreased activity tolerance;Impaired balance (sitting and/or standing);Impaired vision/perception;Impaired UE functional use;Pain      OT Treatment/Interventions: Self-care/ADL training;Therapeutic exercise;Neuromuscular education;Energy conservation;DME and/or AE instruction;Manual therapy;Therapeutic activities;Patient/family education    OT Goals(Current goals can be found in the care plan section) Acute Rehab OT Goals Patient Stated Goal: to return home and improve strength, decrease pain OT Goal Formulation: With patient/family Time For Goal Achievement: 05/22/23 Potential to Achieve Goals: Good  OT Frequency: Min 2X/week    Co-evaluation              AM-PAC OT "6 Clicks" Daily Activity     Outcome Measure Help from another person eating meals?: A Little Help from another person taking care of personal grooming?: A Little Help from another person toileting, which includes using toliet, bedpan, or urinal?: A Lot Help from another person bathing (including washing, rinsing, drying)?: A Lot Help from another person to put  on and taking off regular upper body clothing?: A Little Help from another person to put on and taking off regular lower body clothing?: A Lot 6 Click Score: 15   End of Session Equipment Utilized During Treatment: Gait  belt;Rolling walker (2 wheels) Nurse Communication: Mobility status  Activity Tolerance: Patient limited by pain Patient left: in bed;with call bell/phone within reach;with bed alarm set;with family/visitor present  OT Visit Diagnosis: Unsteadiness on feet (R26.81);Other abnormalities of gait and mobility (R26.89);Repeated falls (R29.6);Muscle weakness (generalized) (M62.81);Pain Pain - part of body:  (back)                Time: 1610-9604 OT Time Calculation (min): 38 min Charges:  OT General Charges $OT Visit: 1 Visit OT Evaluation $OT Eval Moderate Complexity: 1 Mod OT Treatments $Self Care/Home Management : 23-37 mins  [address], OTR/L   Alexis Goodell 05/08/2023, 1:02 PM

## 2023-05-08 NOTE — Progress Notes (Signed)
Orthopedic Tech Progress Note Patient Details:  James Barber Mar 11, 1946 [id number]  Delivered LSO to pt room. Ortho Devices Type of Ortho Device: Lumbar corsett Ortho Device/Splint Interventions: Floria Raveling 05/08/2023, 11:50 AM

## 2023-05-08 NOTE — Progress Notes (Signed)
PT Cancellation Note  Patient Details Name: James Barber MRN: [id number] DOB: 1946-01-16   Cancelled Treatment:    Reason Eval/Treat Not Completed: Patient at procedure or test/unavailable.  Pt is with another staff member and will reattempt at a later time.   Ivar Drape 05/08/2023, 12:09 PM  Samul Dada, PT PhD Acute Rehab Dept. Number: [facility] [id number] and [facility] [phone]

## 2023-05-08 NOTE — Anesthesia Postprocedure Evaluation (Signed)
Anesthesia Post Note  Patient: James Barber  Procedure(s) Performed: LUMBAR FOUR-FIVE, LUMBAR FIVE-SACRAL ONE POSTERIOR LUMBAR INTERBODY FUSION (Spine Lumbar)     Patient location during evaluation: PACU Anesthesia Type: General Level of consciousness: sedated and patient cooperative Pain management: pain level controlled Vital Signs Assessment: post-procedure vital signs reviewed and stable Respiratory status: spontaneous breathing Cardiovascular status: stable Anesthetic complications: no Comments: Pt had ? Vagal response upon standing prior to transfer to floor. Attempted to move patient again. BP check when sitting, and orthostatic. Moved back to stretcher. Pt given fluid bolus and CBC check. Decision made to send pt. to 4N instead of 3C. Pt was pale. PRBc ordered for presumptive drop in Hgb. CBC came back at 12.7 so not given. Color also improved.   No notable events documented.  Last Vitals:  Vitals:   05/08/23 0343 05/08/23 0500  BP: (!) 91/49 (!) 91/51  Pulse: 82 82  Resp: 18 16  Temp: 36.9 C 36.8 C  SpO2: 93% 91%    Last Pain:  Vitals:   05/08/23 0500  TempSrc: Oral  PainSc: 7                  Lewie Loron

## 2023-05-08 NOTE — Progress Notes (Signed)
  Inpatient Rehabilitation Admissions Coordinator   Per therapy recommendations patient was screened for CIR candidacy by Ottie Glazier RN MSN. Current payor trends with [facility] Medicare are unlikley to approve a CIR/AIR level rehab for this diagnosis. I will not place a Rehab Conuslt at this time. Recommend other rehab venues to be pursued.   Ottie Glazier, RN, MSN Rehab Admissions Coordinator [phone] 05/08/2023 5:18 PM

## 2023-05-08 NOTE — Progress Notes (Signed)
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  Clinical Impression  Pt has received lumbar fusion with new precautions, has demonstrated ability to be assisted to stand and walk a short trip in the room.  He is attended by wife who is in agreement with plan to encourage pt to go to [facility] for short stay, especially since he must climb stairs to return home and has recently fallen at home.  Pt is motivated to stand and walk but has significant issues of safety from weakness and stability to walk.  Will recommend he have acute therapy with focus on gait, balance and safety, as well as instructing him in back precautions and use of corset.  Follow along with pt for goals of PT.        Recommendations for follow up therapy are one component of a multi-disciplinary discharge planning process, led by the attending physician.  Recommendations may be updated based on patient status, additional functional criteria and insurance authorization.  Follow Up Recommendations       Assistance Recommended at Discharge Frequent or constant Supervision/Assistance  Patient can return home with the following  Two people to help with walking and/or transfers;A lot of help with bathing/dressing/bathroom;Assistance with cooking/housework;Assist for transportation;Help with stairs or ramp for entrance    Equipment Recommendations None recommended by PT (TBD at rehab)  Recommendations for Other Services  Rehab consult    Functional Status Assessment Patient has had a recent decline in their functional status and  demonstrates the ability to make significant improvements in function in a reasonable and predictable amount of time.     Precautions / Restrictions Precautions Precautions: Back;Cervical Precaution Comments: use lumbar corset with mobility Required Braces or Orthoses: Spinal Brace Spinal Brace: Lumbar corset Restrictions Weight Bearing Restrictions: No      Mobility  Bed Mobility Overal bed mobility: Needs Assistance Bed Mobility: Supine to Sit, Sit to Supine     Supine to sit: Mod assist Sit to supine: Mod assist   General bed mobility comments:  (reviewed body mechanics but requires a lot of help to sit up from sidelying and to get legs to bed from sitting)    Transfers Overall transfer level: Needs assistance Equipment used: Rolling walker (2 wheels) Transfers: Sit to/from Stand Sit to Stand: Min assist           General transfer comment: elevated bed, supported to stand with gait belt    Ambulation/Gait Ambulation/Gait assistance: Min assist, +2 safety/equipment Gait Distance (Feet): 36 Feet (18 x 2) Assistive device: Rolling walker (2 wheels), 1 person hand held assist Gait Pattern/deviations: Step-through pattern, Decreased stride length, Knees buckling, Wide base of support Gait velocity: reduced Gait velocity interpretation: <1.8 ft/sec, indicate of risk for recurrent falls Pre-gait activities: standing balance ck General Gait Details: Pt tends to need reminders to stand up straight and pull up through knees to walk to BR and back  Stairs            Wheelchair Mobility    Modified Rankin (Stroke Patients Only)       Balance Overall balance assessment: Needs assistance Sitting-balance support: Feet supported Sitting balance-Leahy Scale: Fair Sitting  balance - Comments: fair in back brace side of bed   Standing balance support: Bilateral upper extremity supported, During functional activity, Reliant on assistive device for balance Standing  balance-Leahy Scale: Poor Standing balance comment: RW brace and cues for safety and support                             Pertinent Vitals/Pain Pain Assessment Pain Assessment: Faces Faces Pain Scale: Hurts little more Pain Location: lower back Pain Descriptors / Indicators: Grimacing, Guarding Pain Intervention(s): Limited activity within patient's tolerance, Monitored during session, Premedicated before session, Repositioned    Home Living Family/patient expects to be discharged to:: Private residence Living Arrangements: Spouse/significant other;Parent Available Help at Discharge: Family Type of Home: House Home Access: Stairs to enter Entrance Stairs-Rails: Can reach both Entrance Stairs-Number of Steps: 3   Home Layout: Able to live on main level with bedroom/bathroom Home Equipment: Cane - single point;Shower seat;Grab bars - toilet;Grab bars - tub/shower Additional Comments: Pt lives at home with wife    Prior Function Prior Level of Function : Independent/Modified Independent             Mobility Comments: SPC previously but beginning to have instability and falls/near falls       Hand Dominance   Dominant Hand: Right    Extremity/Trunk Assessment   Upper Extremity Assessment Upper Extremity Assessment: Defer to OT evaluation    Lower Extremity Assessment Lower Extremity Assessment: Generalized weakness    Cervical / Trunk Assessment Cervical / Trunk Assessment: Neck Surgery;Back Surgery  Communication   Communication: No difficulties  Cognition Arousal/Alertness: Awake/alert Behavior During Therapy: WFL for tasks assessed/performed Overall Cognitive Status: Within Functional Limits for tasks assessed                                 General Comments: some memory issues post CVA        General Comments General comments (skin integrity, edema, etc.): Pt is fatigued to walk to BR initially, requires pulling on gait belt to  encourage upright posture and cues for extending knees, habitually starts to flex into legs.  Kept chair close to walk    Exercises     Assessment/Plan    PT Assessment Patient needs continued PT services  PT Problem List Decreased strength;Decreased activity tolerance;Decreased balance;Decreased mobility;Decreased coordination;Decreased safety awareness;Decreased skin integrity;Pain       PT Treatment Interventions DME instruction;Gait training;Stair training;Functional mobility training;Therapeutic activities;Therapeutic exercise;Balance training;Neuromuscular re-education;Patient/family education    PT Goals (Current goals can be found in the Care Plan section)  Acute Rehab PT Goals Patient Stated Goal: to get to bathroom PT Goal Formulation: With patient/family Time For Goal Achievement: 05/22/23 Potential to Achieve Goals: Good    Frequency Min 5X/week     Co-evaluation               AM-PAC PT "6 Clicks" Mobility  Outcome Measure Help needed turning from your back to your side while in a flat bed without using bedrails?: A Lot Help needed moving from lying on your back to sitting on the side of a flat bed without using bedrails?: A Lot Help needed moving to and from a bed to a chair (including a wheelchair)?: A Little Help needed standing up from a chair using your arms (e.g., wheelchair or bedside chair)?: A Little Help needed to walk in hospital room?:  A Lot Help needed climbing 3-5 steps with a railing? : Total 6 Click Score: 13    End of Session Equipment Utilized During Treatment: Gait belt Activity Tolerance: Patient tolerated treatment well;Patient limited by fatigue;Treatment limited secondary to medical complications (Comment) Patient left: in bed;with call bell/phone within reach;with bed alarm set;with family/visitor present Nurse Communication: Mobility status PT Visit Diagnosis: Unsteadiness on feet (R26.81);Muscle weakness (generalized)  (M62.81);History of falling (Z91.81);Difficulty in walking, not elsewhere classified (R26.2);Pain Pain - Right/Left:  (back) Pain - part of body:  (back)    Time: 1610-9604 PT Time Calculation (min) (ACUTE ONLY): 33 min   Charges:   PT Evaluation $PT Eval Moderate Complexity: 1 Mod PT Treatments $Gait Training: 8-22 mins       Ivar Drape 05/08/2023, 4:51 PM  Samul Dada, PT PhD Acute Rehab Dept. Number: [facility] [id number] and [facility] [phone]

## 2023-05-08 NOTE — Progress Notes (Signed)
Patient ID: James Barber, male   DOB: [birth year], 77 y.o.   MRN: [id number] Patient's vital signs are stable though blood pressure tends to be a little low Motor function appears good to confrontational testing in iliopsoas quad tibialis anterior and gastrocs Dressing remains clean and dry Postoperative hemoglobin down to 9.2.  Creatinine increased to 1.58.  Blood sugars 212.  Will write for sliding scale insulin coverage.

## 2023-05-08 NOTE — Plan of Care (Signed)
  Problem: Education: Goal: Knowledge of General Education information will improve Description: Including pain rating scale, medication(s)/side effects and non-pharmacologic comfort measures Outcome: Progressing   Problem: Health Behavior/Discharge Planning: Goal: Ability to manage health-related needs will improve Outcome: Progressing   Problem: Activity: Goal: Risk for activity intolerance will decrease Outcome: Progressing   

## 2023-05-09 LAB — GLUCOSE, CAPILLARY
Glucose-Capillary: 127 mg/dL — ABNORMAL HIGH (ref 70–99)
Glucose-Capillary: 70 mg/dL (ref 70–99)
Glucose-Capillary: 80 mg/dL (ref 70–99)
Glucose-Capillary: 81 mg/dL (ref 70–99)
Glucose-Capillary: 89 mg/dL (ref 70–99)

## 2023-05-09 LAB — CBC
HCT: 27.1 % — ABNORMAL LOW (ref 39.0–52.0)
Hemoglobin: 9.1 g/dL — ABNORMAL LOW (ref 13.0–17.0)
MCH: 33.1 pg (ref 26.0–34.0)
MCHC: 33.6 g/dL (ref 30.0–36.0)
MCV: 98.5 fL (ref 80.0–100.0)
Platelets: 139 10*3/uL — ABNORMAL LOW (ref 150–400)
RBC: 2.75 MIL/uL — ABNORMAL LOW (ref 4.22–5.81)
RDW: 14.4 % (ref 11.5–15.5)
WBC: 7.4 10*3/uL (ref 4.0–10.5)
nRBC: 0 % (ref 0.0–0.2)

## 2023-05-09 LAB — BASIC METABOLIC PANEL
Anion gap: 5 (ref 5–15)
BUN: 15 mg/dL (ref 8–23)
CO2: 26 mmol/L (ref 22–32)
Calcium: 8.3 mg/dL — ABNORMAL LOW (ref 8.9–10.3)
Chloride: 104 mmol/L (ref 98–111)
Creatinine, Ser: 0.99 mg/dL (ref 0.61–1.24)
GFR, Estimated: >60 mL/min (ref >60)
Glucose, Bld: 78 mg/dL (ref 70–99)
Potassium: 3.6 mmol/L (ref 3.5–5.1)
Sodium: 135 mmol/L (ref 135–145)

## 2023-05-09 LAB — HEMOGLOBIN A1C
Hgb A1c MFr Bld: 6.9 % — ABNORMAL HIGH (ref 4.8–5.6)
Mean Plasma Glucose: 151 mg/dL

## 2023-05-09 MED ORDER — CHLORHEXIDINE GLUCONATE CLOTH 2 % EX PADS
6.0000 | MEDICATED_PAD | Freq: Every day | CUTANEOUS | Status: DC
  Administered 2023-05-09 – 2023-05-10 (×2): 6 via TOPICAL

## 2023-05-09 MED ORDER — CEFAZOLIN SODIUM-DEXTROSE 1-4 GM/50ML-% IV SOLN
1.0000 g | Freq: Three times a day (TID) | INTRAVENOUS | Status: DC
  Administered 2023-05-09 – 2023-05-11 (×5): 1 g via INTRAVENOUS
  Filled 2023-05-09 (×5): qty 50

## 2023-05-09 MED ORDER — DEXAMETHASONE 2 MG PO TABS
2.0000 mg | ORAL_TABLET | Freq: Three times a day (TID) | ORAL | Status: DC
  Administered 2023-05-09 – 2023-05-10 (×4): 2 mg via ORAL
  Filled 2023-05-09 (×5): qty 1

## 2023-05-09 NOTE — Progress Notes (Signed)
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    PT Comments    Pt is assisted to get up to side of bed and to balance, then to steady for standing and progress to bathroom.  Pt is getting better knee control but is still quite weak after sitting on commode.  Pt is still a good candidate for rehab but noted [facility] is not likely to accept him due to his endurance and energy to move.  Follow along with him and plan for <3 hours a day care to be likely avenue of recovery.  Encourage pt to be up in chair more, to increase his ability to be up standing and walk for a longer trip.  Goals of acute PT are on POC.   Recommendations for follow up therapy are one component of a multi-disciplinary discharge planning process, led by the attending physician.  Recommendations may be updated based on patient status, additional functional criteria and insurance authorization.  Follow Up Recommendations       Assistance Recommended at Discharge Frequent or constant Supervision/Assistance  Patient can return home with the following Two people to help with walking and/or transfers;A lot of help with bathing/dressing/bathroom;Assistance with cooking/housework;Assist for transportation;Help with stairs or ramp for entrance   Equipment Recommendations  None recommended by PT    Recommendations for Other Services Rehab consult     Precautions / Restrictions Precautions Precautions: Back;Cervical Precaution Booklet Issued: No Precaution Comments: use lumbar corset with mobility Required Braces or Orthoses:  Spinal Brace Spinal Brace: Lumbar corset Restrictions Weight Bearing Restrictions: No     Mobility  Bed Mobility Overal bed mobility: Needs Assistance Bed Mobility: Supine to Sit, Sit to Supine     Supine to sit: Min assist, Mod assist Sit to supine: Min assist   General bed mobility comments: more assist needed to get up last session and now is regaining control of trunk on side of bed.    Transfers Overall transfer level: Needs assistance Equipment used: Rolling walker (2 wheels) Transfers: Sit to/from Stand Sit to Stand: Min assist                Ambulation/Gait Ambulation/Gait assistance: Min Chemical engineer (Feet): 36 Feet Assistive device: Rolling walker (2 wheels), 1 person hand held assist Gait Pattern/deviations: Step-through pattern, Decreased stride length, Knees buckling, Wide base of support Gait velocity: reduced Gait velocity interpretation: <1.31 ft/sec, indicative of household ambulator   General Gait Details: better control of knees but is clearly weak with support of walker needed   Stairs             Wheelchair Mobility    Modified Rankin (Stroke Patients Only)       Balance Overall balance assessment: Needs assistance Sitting-balance support: Feet supported Sitting balance-Leahy Scale: Fair (once set) Sitting balance - Comments: fair in back brace side of bed Postural control: Posterior lean Standing balance support: Bilateral upper extremity supported, During functional activity Standing balance-Leahy Scale: Poor                              Cognition Arousal/Alertness: Lethargic, Awake/alert  Behavior During Therapy: Flat affect Overall Cognitive Status: Within Functional Limits for tasks assessed                                          Exercises      General Comments General comments (skin integrity, edema, etc.): tired this PM and needed encouragement to assist with mobility       Pertinent Vitals/Pain Pain Assessment Pain Assessment: Faces Faces Pain Scale: Hurts little more Pain Location: lower back Pain Descriptors / Indicators: Guarding, Sore Pain Intervention(s): Limited activity within patient's tolerance, Monitored during session, Premedicated before session, Repositioned    Home Living                          Prior Function            PT Goals (current goals can now be found in the care plan section) Acute Rehab PT Goals Patient Stated Goal: bathroom    Frequency    Min 5X/week      PT Plan Current plan remains appropriate    Co-evaluation              AM-PAC PT "6 Clicks" Mobility   Outcome Measure  Help needed turning from your back to your side while in a flat bed without using bedrails?: A Little Help needed moving from lying on your back to sitting on the side of a flat bed without using bedrails?: A Lot Help needed moving to and from a bed to a chair (including a wheelchair)?: A Lot Help needed standing up from a chair using your arms (e.g., wheelchair or bedside chair)?: A Lot Help needed to walk in hospital room?: A Little Help needed climbing 3-5 steps with a railing? : Total 6 Click Score: 13    End of Session Equipment Utilized During Treatment: Gait belt Activity Tolerance: Patient tolerated treatment well;Patient limited by fatigue;Treatment limited secondary to medical complications (Comment) Patient left: in bed;with call bell/phone within reach;with bed alarm set;with family/visitor present Nurse Communication: Mobility status PT Visit Diagnosis: Unsteadiness on feet (R26.81);Muscle weakness (generalized) (M62.81);History of falling (Z91.81);Difficulty in walking, not elsewhere classified (R26.2);Pain     Time: 1610-9604 PT Time Calculation (min) (ACUTE ONLY): 37 min  Charges:  $Gait Training: 8-22 mins $Therapeutic Activity: 8-22 mins        Ivar Drape 05/09/2023, 7:16 PM  Samul Dada, PT  PhD Acute Rehab Dept. Number: [facility] [id number] and [facility] [phone]

## 2023-05-09 NOTE — Progress Notes (Signed)
Pharmacy Antibiotic Note  James Barber is a 77 y.o. 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 Patient is now s/p lumbar interbody fusion on 6/3. Exam today with  draining surgical site .  Pharmacy has been consulted for cefazolin dosing.    Plan: Cefazolin 1 gram IV every 8 hours Monitor clinical progress, cultures/sensitivities, renal function, abx plan   Height: 6' (182.9 cm) Weight: 83 kg (183 lb) IBW/kg (Calculated) : 77.6  Temp (24hrs), Avg:98.7 F (37.1 C), Min:98.2 F (36.8 C), Max:99.8 F (37.7 C)  Recent Labs  Lab 05/07/23 1358 05/07/23 1608 05/08/23 0206 05/08/23 1434  WBC  --  14.6* 8.9  --   CREATININE 0.90  --  1.58* 1.28*    Estimated Creatinine Clearance: 55.6 mL/min (A) (by C-G formula based on SCr of 1.28 mg/dL (H)).    No Known Allergies  Antimicrobials this admission: 6/3-4; 6/5 cefazolin >>   Dose adjustments this admission:  Microbiology results:   Thank you for allowing pharmacy to be a part of this patient's care.   Signe Colt, PharmD 05/09/2023 9:19 AM  **Pharmacist phone directory can be found on [email or web address] listed under [facility]**

## 2023-05-09 NOTE — Progress Notes (Signed)
Inpatient Rehab Admissions Coordinator:   Consult received and chart reviewed.  Agree with my colleague, Britta Mccreedy. [employer] Medicare unlikely to approve CIR for this diagnosis.  In any case, we will not have any open beds till middle of next week at the earliest.  Note TOC working on SNF and pt agreeable.  Will sign off.   Estill Dooms, PT, DPT Admissions Coordinator [phone] 05/09/23  4:28 PM

## 2023-05-09 NOTE — TOC CM/SW Note (Signed)
Transition of Care [facility]) - Inpatient Brief Assessment   Patient Details  Name: James Barber MRN: [id number] Date of Birth: [birth year]  Transition of Care [facility] [place] [facility]) CM/SW Contact:    Epifanio Lesches, RN Phone Number: 05/09/2023, 8:17 AM   Clinical Narrative:   - s/p  Bilateral decompression L4-5 L5-S1, lumbar interbody arthrodesis L4-5 and L5-S1 6/3 Pt screened by CIR liaison. Per liaison pt  unlikely to get approval for [facility] bed with dz from insurance.   TOC to f/u with pt regarding SNF vs home health services and assist with needs...  Transition of Care Asessment: Insurance and Status: Insurance coverage has been reviewed Patient has primary care physician: Yes Home environment has been reviewed: From home with wife Prior level of function:: indeoendent with ADL's Prior/Current Home Services: No current home services Social Determinants of Health Reivew: SDOH reviewed no interventions necessary Readmission risk has been reviewed: No Transition of care needs: no transition of care needs at this time

## 2023-05-09 NOTE — Progress Notes (Signed)
Patient ID: James Barber, male   DOB: 30-Jun-1946, 77 y.o.   MRN: [id number] Vital signs are stable.  Patient is having substantial pain while moving.  Difficult to move and ambulate.  Patient does have a history of a stroke in addition to cervical myelopathy which may reinforce lower extremity weakness.  I will ask rehabilitation medicine to see him.  Dressing changed today.  Some small areas of bleedthrough noted.  Incision painted with Betadine and dressed with dry sterile gauze.  Will add dressing changes daily.

## 2023-05-09 NOTE — [place] FL2 (Signed)
[place] MEDICAID FL2 LEVEL OF CARE FORM     IDENTIFICATION  Patient Name: James Barber Birthdate: [birth year] Sex: male Admission Date (Current Location): 05/07/2023  [facility] and [place] Number:  Producer, television/film/video and Address:  The . [facility], [address] [place], [place] [address]      Provider Number: [id number]  Attending Physician Name and Address:  Barnett Abu, MD  Relative Name and Phone Number:  Jahir, Kossman Spouse [phone]  [phone]    Current Level of Care: Hospital Recommended Level of Care: Skilled Nursing Facility Prior Approval Number:    Date Approved/Denied:   PASRR Number: [id number]  Discharge Plan: SNF    Current Diagnoses: Patient Active Problem List   Diagnosis Date Noted   Spondylolisthesis at L4-L5 level 05/07/2023   Acute pulmonary embolus (HCC) 04/19/2015   Rash and nonspecific skin eruption 04/19/2015   Diabetes mellitus, type II (HCC) 03/30/2015   Elevated LFTs 03/30/2015   Abnormal liver function tests 03/30/2015   Incisional hernia, without obstruction or gangrene 03/21/2015   Recurrent SBO (small bowel obstruction) 03/15/2015   HYPERLIPIDEMIA 03/09/2008   Iron deficiency anemia 03/09/2008   GERD 03/09/2008   Bilateral inguinal hernia (BIH), Left > Right 03/09/2008    Orientation RESPIRATION BLADDER Height & Weight     Self, Time, Situation, Place  Normal Indwelling catheter, Continent Weight: 183 lb (83 kg) Height:  6' (182.9 cm)  BEHAVIORAL SYMPTOMS/MOOD NEUROLOGICAL BOWEL NUTRITION STATUS      Continent Diet (see discharge summary)  AMBULATORY STATUS COMMUNICATION OF NEEDS Skin   Limited Assist Verbally Surgical wounds                       Personal Care Assistance Level of Assistance  Bathing, Feeding, Dressing Bathing Assistance: Maximum assistance Feeding assistance: Limited assistance Dressing Assistance: Maximum assistance     Functional Limitations Info  Sight,  Hearing, Speech Sight Info: Adequate Hearing Info: Adequate Speech Info: Adequate    SPECIAL CARE FACTORS FREQUENCY  PT (By licensed PT), OT (By licensed OT)     PT Frequency: 5x week OT Frequency: 5x week            Contractures Contractures Info: Not present    Additional Factors Info  Code Status, Allergies, Insulin Sliding Scale Code Status Info: full Allergies Info: NKA   Insulin Sliding Scale Info: Novolog: see discharge summary       Current Medications (05/09/2023):  This is the current hospital active medication list Current Facility-Administered Medications  Medication Dose Route Frequency Provider Last Rate Last Admin   0.9 %  sodium chloride infusion  250 mL Intravenous Continuous Barnett Abu, MD 1 mL/hr at 05/07/23 2139 250 mL at 05/07/23 2139   acetaminophen (TYLENOL) tablet 650 mg  650 mg Oral Q4H PRN Barnett Abu, MD   650 mg at 05/08/23 2110   Or   acetaminophen (TYLENOL) suppository 650 mg  650 mg Rectal Q4H PRN Barnett Abu, MD       atorvastatin (LIPITOR) tablet 80 mg  80 mg Oral Claris Gladden, MD   80 mg at 05/08/23 2110   bisacodyl (DULCOLAX) suppository 10 mg  10 mg Rectal Daily PRN Barnett Abu, MD       brimonidine (ALPHAGAN) 0.2 % ophthalmic solution 1 drop  1 drop Left Eye BID Barnett Abu, MD   1 drop at 05/09/23 0954   dexamethasone (DECADRON) tablet 2 mg  2 mg Oral Q8H Elsner,  Sherilyn Cooter, MD       docusate sodium (COLACE) capsule 100 mg  100 mg Oral BID Barnett Abu, MD   100 mg at 05/09/23 0954   DULoxetine (CYMBALTA) DR capsule 20 mg  20 mg Oral Claris Gladden, MD   20 mg at 05/08/23 2110   empagliflozin (JARDIANCE) tablet 25 mg  25 mg Oral q AM Barnett Abu, MD   25 mg at 05/09/23 1610   ferrous sulfate tablet 324 mg  324 mg Oral Claris Gladden, MD   324 mg at 05/08/23 2110   gabapentin (NEURONTIN) capsule 800 mg  800 mg Oral BID Barnett Abu, MD   800 mg at 05/09/23 9604   glipiZIDE (GLUCOTROL XL) 24 hr tablet 10 mg  10 mg Oral  Claris Gladden, MD   10 mg at 05/08/23 2110   insulin aspart (novoLOG) injection 0-20 Units  0-20 Units Subcutaneous TID WC Barnett Abu, MD       lactated ringers infusion   Intravenous Continuous Barnett Abu, MD 125 mL/hr at 05/08/23 1904 New Bag at 05/08/23 1904   levothyroxine (SYNTHROID) tablet 100 mcg  100 mcg Oral Claris Gladden, MD   100 mcg at 05/08/23 2110   lisinopril (ZESTRIL) tablet 20 mg  20 mg Oral Daily Barnett Abu, MD   20 mg at 05/09/23 5409   menthol-cetylpyridinium (CEPACOL) lozenge 3 mg  1 lozenge Oral PRN Barnett Abu, MD       Or   phenol (CHLORASEPTIC) mouth spray 1 spray  1 spray Mouth/Throat PRN Barnett Abu, MD       methocarbamol (ROBAXIN) tablet 500 mg  500 mg Oral Q6H PRN Barnett Abu, MD   500 mg at 05/09/23 8119   Or   methocarbamol (ROBAXIN) 500 mg in dextrose 5 % 50 mL IVPB  500 mg Intravenous Q6H PRN Barnett Abu, MD       morphine (PF) 2 MG/ML injection 2 mg  2 mg Intravenous Q2H PRN Barnett Abu, MD       ondansetron (ZOFRAN) tablet 4 mg  4 mg Oral Q6H PRN Barnett Abu, MD       Or   ondansetron (ZOFRAN) injection 4 mg  4 mg Intravenous Q6H PRN Barnett Abu, MD       oxyCODONE (Oxy IR/ROXICODONE) immediate release tablet 5-10 mg  5-10 mg Oral Q4H PRN Barnett Abu, MD   10 mg at 05/09/23 1042   polyethylene glycol (MIRALAX / GLYCOLAX) packet 17 g  17 g Oral Daily PRN Barnett Abu, MD       senna (SENOKOT) tablet 8.6 mg  1 tablet Oral BID Barnett Abu, MD   8.6 mg at 05/09/23 1478   sodium chloride flush (NS) 0.9 % injection 3 mL  3 mL Intravenous Q12H Barnett Abu, MD   3 mL at 05/09/23 0955   sodium chloride flush (NS) 0.9 % injection 3 mL  3 mL Intravenous PRN Barnett Abu, MD       sodium phosphate (FLEET) 7-19 GM/118ML enema 1 enema  1 enema Rectal Once PRN Barnett Abu, MD       tiZANidine (ZANAFLEX) tablet 4 mg  4 mg Oral Claris Gladden, MD   4 mg at 05/08/23 2111     Discharge Medications: Please see discharge summary for  a list of discharge medications.  Relevant Imaging Results:  Relevant Lab Results:   Additional Information SSN: [id number]  Lorri Frederick, LCSW

## 2023-05-09 NOTE — TOC Initial Note (Signed)
Transition of Care [facility]) - Initial/Assessment Note    Patient Details  Name: James Barber MRN: [id number] Date of Birth: 04-23-1946  Transition of Care [facility]) CM/SW Contact:    Lorri Frederick, LCSW Phone Number: 05/09/2023, 11:09 AM  Clinical Narrative:   CSW met with pt and wife James Barber regarding DC recommendation.  Permission given to speak with wife.  Discussed initial CIR recommendation, they are aware CIR not moving forward, agreeable to SNF, medicare choice document provided, permission given to send out referral in hub.  Pt is from home with wife and mother in law, no current services.  Referral sent out in hub for SNF.                Expected Discharge Plan: Skilled Nursing Facility Barriers to Discharge: Continued Medical Work up, SNF Pending bed offer   Patient Goals and CMS Choice Patient states their goals for this hospitalization and ongoing recovery are:: normal life CMS Medicare.gov Compare Post Acute Care list provided to:: Patient Represenative (must comment) (wife James Barber) Choice offered to / list presented to : Patient, Spouse      Expected Discharge Plan and Services In-house Referral: Clinical Social Work   Post Acute Care Choice: Skilled Nursing Facility Living arrangements for the past 2 months: Single Family Home                                      Prior Living Arrangements/Services Living arrangements for the past 2 months: Single Family Home Lives with:: Spouse, Relatives (mother in law)   Do you feel safe going back to the place where you live?: Yes      Need for Family Participation in Patient Care: No (Comment) Care giver support system in place?: Yes (comment) Current home services: Other (comment) (none) Criminal Activity/Legal Involvement Pertinent to Current Situation/Hospitalization: No - Comment as needed  Activities of Daily Living      Permission Sought/Granted Permission sought to share information with : Family  Supports Permission granted to share information with : Yes, Verbal Permission Granted  Share Information with NAME: wife James Barber  Permission granted to share info w AGENCY: SNF        Emotional Assessment Appearance:: Appears stated age Attitude/Demeanor/Rapport: Engaged Affect (typically observed): Appropriate Orientation: : Oriented to Self, Oriented to Place, Oriented to  Time, Oriented to Situation      Admission diagnosis:  Spondylolisthesis at L4-L5 level [M43.16] Patient Active Problem List   Diagnosis Date Noted   Spondylolisthesis at L4-L5 level 05/07/2023   Acute pulmonary embolus (HCC) 04/19/2015   Rash and nonspecific skin eruption 04/19/2015   Diabetes mellitus, type II (HCC) 03/30/2015   Elevated LFTs 03/30/2015   Abnormal liver function tests 03/30/2015   Incisional hernia, without obstruction or gangrene 03/21/2015   Recurrent SBO (small bowel obstruction) 03/15/2015   HYPERLIPIDEMIA 03/09/2008   Iron deficiency anemia 03/09/2008   GERD 03/09/2008   Bilateral inguinal hernia (BIH), Left > Right 03/09/2008   PCP:  Eartha Inch, MD Pharmacy:   [facility] [phone] - [place], [place] - [address] [facility] [address] [place] [address] Phone: [phone] Fax: [phone]     Social Determinants of Health (SDOH) Social History: SDOH Screenings   Tobacco Use: Low Risk  (05/07/2023)   SDOH Interventions:     Readmission Risk Interventions  No data to display

## 2023-05-10 LAB — GLUCOSE, CAPILLARY
Glucose-Capillary: 64 mg/dL — ABNORMAL LOW (ref 70–99)
Glucose-Capillary: 91 mg/dL (ref 70–99)
Glucose-Capillary: 106 mg/dL — ABNORMAL HIGH (ref 70–99)
Glucose-Capillary: 118 mg/dL — ABNORMAL HIGH (ref 70–99)
Glucose-Capillary: 246 mg/dL — ABNORMAL HIGH (ref 70–99)

## 2023-05-10 MED ORDER — DEXAMETHASONE 2 MG PO TABS
2.0000 mg | ORAL_TABLET | Freq: Two times a day (BID) | ORAL | Status: DC
  Administered 2023-05-10 – 2023-05-11 (×2): 2 mg via ORAL
  Filled 2023-05-10 (×3): qty 1

## 2023-05-10 MED ORDER — RIVAROXABAN 10 MG PO TABS
10.0000 mg | ORAL_TABLET | Freq: Every day | ORAL | Status: DC
  Administered 2023-05-10: 10 mg via ORAL
  Filled 2023-05-10: qty 1

## 2023-05-10 MED ORDER — TAMSULOSIN HCL 0.4 MG PO CAPS
0.4000 mg | ORAL_CAPSULE | Freq: Every day | ORAL | Status: DC
  Administered 2023-05-11: 0.4 mg via ORAL
  Filled 2023-05-10: qty 1

## 2023-05-10 NOTE — Inpatient Diabetes Management (Signed)
Inpatient Diabetes Program Recommendations  AACE/ADA: New Consensus Statement on Inpatient Glycemic Control Target Ranges:  Prepandial:   less than 140 mg/dL      Peak postprandial:   less than 180 mg/dL (1-2 hours)      Critically ill patients:  140 - 180 mg/dL    Latest Reference Range & Units 05/10/23 07:38 05/10/23 08:13 05/10/23 11:49  Glucose-Capillary 70 - 99 mg/dL 64 (L) 91 098 (H)    Latest Reference Range & Units 05/09/23 00:30 05/09/23 09:59 05/09/23 11:33 05/09/23 15:50 05/09/23 20:55  Glucose-Capillary 70 - 99 mg/dL 119 (H) 70 81 80 89   Review of Glycemic Control  Diabetes history: DM2 Outpatient Diabetes medications: Jardiance 25 mg daily, Glipizide 10 mg QHS Current orders for Inpatient glycemic control: Jardiance 25 mg daily, Glipizide XL 10 mg QHS, Novolog 0-20 units TID with meals; Decadron 2 mg Q8H  Inpatient Diabetes Program Recommendations:    Oral DM medication: Fasting CBG 64 mg/dl today.  May want to consider discontinuing Glipizide.  Thanks, Orlando Penner, RN, MSN, CDCES Diabetes Coordinator Inpatient Diabetes Program [phone] (Team Pager from 8am to 5pm)

## 2023-05-10 NOTE — Progress Notes (Signed)
Patient ID: James Barber, male   DOB: [birth year], 77 y.o.   MRN: [id number] Vital signs are stable Patient is feeling bit more comfortable I do believe the Decadron is helping him Will cut this down to twice daily At this time I believe would be wise to remove his Foley He continues on IV Ancef for now Arrangements are being made for SNF placement Overall he is improving steadily

## 2023-05-10 NOTE — Progress Notes (Signed)
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   OT comments  Pt has minimal pain today, 1/10 at rest, 3/10 during functional activities. Pt motivated to participate and improve function. Pt instructed on RUE exercises, limited with exercises due to IV in L hand/wrist, would benefit from further instruction if IV is relocated or removed. Pt displays decreased ability to navigate obstacles due to R side vision loss, difficulty following verbal commands to locate objects in room, difficulty with scanning for objects and with critical thinking skills. Pt able to perform ambulation up to 20 feet before needing rest breaks. Pt would benefit from continued skilled therapy to improve functional strength, activity tolerance, improve with visual scanning/navigation, and improve RUE strength and GM/FM skills. Pt would benefit with DC to post acute <3hrs/day, will continue to see acutely   Recommendations for follow up therapy are one component of a multi-disciplinary discharge planning process, led by the attending physician.  Recommendations may be updated based on patient status, additional functional criteria and insurance authorization.    Assistance Recommended at Discharge Frequent or constant Supervision/Assistance  Patient can return home with the following  A little help with walking and/or transfers;A lot of help with bathing/dressing/bathroom;Assistance with cooking/housework;Assist for transportation;Help with stairs or ramp for entrance   Equipment Recommendations   (defer to next venue)     Recommendations for Other Services      Precautions / Restrictions Precautions Precautions: Back;Cervical Precaution Comments: use lumbar corset with mobility Required Braces or Orthoses: Spinal Brace Spinal Brace: Lumbar corset Restrictions Weight Bearing Restrictions: No       Mobility Bed Mobility Overal bed mobility: Needs Assistance Bed Mobility: Supine to Sit     Supine to sit: Mod assist, HOB elevated     General bed mobility comments: verbal cues for technique, mod A for supine to sit    Transfers Overall transfer level: Needs assistance Equipment used: Rolling walker (2 wheels) Transfers: Sit to/from Stand, Bed to chair/wheelchair/BSC Sit to Stand: Mod assist     Step pivot transfers: Min guard     General transfer comment: mod A to power from standard height, min A from elevated surface. Pt min guard for ambulation     Balance Overall balance assessment: Needs assistance Sitting-balance support: Feet supported Sitting balance-Leahy Scale: Fair Sitting balance - Comments: fair in back brace side of bed   Standing balance support: Bilateral upper extremity supported, During functional activity, Reliant on assistive device for balance Standing balance-Leahy Scale: Poor Standing balance comment: RW brace and cues for safety and support                           ADL either performed or assessed with clinical judgement   ADL Overall ADL's : Needs assistance/impaired Eating/Feeding: Set up   Grooming: Set up;Sitting;Bed level           Upper Body Dressing : Minimal assistance;Sitting       Toilet Transfer: Minimal assistance;Cueing for safety;Comfort height toilet;Rolling walker (2 wheels)   Toileting- Clothing Manipulation and Hygiene: Total assistance;Sit to/from stand       Functional mobility during ADLs:  Minimal assistance;Rolling walker (2 wheels) General ADL Comments: continues to require assistance with LB ADLs, toileting     Extremity/Trunk Assessment Upper Extremity Assessment Upper Extremity Assessment: RUE deficits/detail RUE Deficits / Details: R handed weakness, R shoudler pain, decreased AROM, decreased FM, hx of stroke RUE Sensation: history of peripheral neuropathy RUE Coordination: decreased fine motor;decreased gross motor            Vision       Perception     Praxis      Cognition Arousal/Alertness: Awake/alert Behavior During Therapy: WFL for tasks assessed/performed Overall Cognitive Status: History of cognitive impairments - at baseline                                 General Comments: alert today, motivated to participate, some decreased critical thinking when performing obstacle navigation around room, scanning for objects, requires verbal cues for scanning or locating objects.        Exercises Exercises: General Upper Extremity    Shoulder Instructions       General Comments      Pertinent Vitals/ Pain       Pain Assessment Pain Assessment: 0-10 Pain Score: 1  Faces Pain Scale: Hurts a little bit Pain Location: lower back Pain Descriptors / Indicators: Guarding, Sore Pain Intervention(s): Monitored during session  Home Living                                          Prior Functioning/Environment              Frequency  Min 2X/week        Progress Toward Goals  OT Goals(current goals can now be found in the care plan section)  Progress towards OT goals: Progressing toward goals  Acute Rehab OT Goals Patient Stated Goal: to improve functional strength and balance OT Goal Formulation: With patient/family Time For Goal Achievement: 05/22/23 Potential to Achieve Goals: Good ADL Goals Pt Will Perform Lower Body Dressing: with min assist;with adaptive equipment Pt Will Transfer to Toilet: with supervision;ambulating Pt Will Perform Toileting - Clothing Manipulation and hygiene: with min guard assist;sitting/lateral  leans Pt Will Perform Tub/Shower Transfer: with supervision;shower seat;rolling walker  Plan Discharge plan needs to be updated    Co-evaluation                 AM-PAC OT "6 Clicks" Daily Activity     Outcome Measure   Help from another person eating meals?: A Little Help from another person taking care of personal grooming?: A Little Help from another person toileting, which includes using toliet, bedpan, or urinal?: A Lot Help from another person bathing (including washing, rinsing, drying)?: A Lot Help from another person to put on and taking off regular upper body clothing?: A Little Help from another person to put on and taking off regular lower body clothing?: A Lot 6 Click Score: 15    End of Session Equipment Utilized During Treatment: Gait belt;Rolling walker (2 wheels)  OT Visit Diagnosis: Unsteadiness on feet (R26.81);Other abnormalities of gait and mobility (R26.89);Repeated falls (R29.6);Muscle weakness (generalized) (M62.81);Pain Pain - part of body:  (back)   Activity Tolerance Patient tolerated treatment well   Patient Left in chair;with call bell/phone within reach;with family/visitor present   Nurse Communication Mobility status  Time: 6578-4696 OT Time Calculation (min): 27 min  Charges: OT General Charges $OT Visit: 1 Visit OT Treatments $Self Care/Home Management : 8-22 mins $Therapeutic Activity: 8-22 mins  Shariq Puig, OTR/L   Alexis Goodell 05/10/2023, 4:34 PM

## 2023-05-10 NOTE — Progress Notes (Signed)
PT Cancellation Note  Patient Details Name: James Barber MRN: [id number] DOB: [birth year]   Cancelled Treatment:    Reason Eval/Treat Not Completed: Fatigue/lethargy limiting ability to participate.  Had just finished OT and declined, follow up another time.   Ivar Drape 05/10/2023, 5:22 PM  Samul Dada, PT PhD Acute Rehab Dept. Number: [facility] [id number] and [facility] [phone]

## 2023-05-10 NOTE — Care Management Important Message (Signed)
Important Message  Patient Details  Name: ASANTI VILLA MRN: [id number] Date of Birth: 1946-10-13   Medicare Important Message Given:  Yes     Sherilyn Banker 05/10/2023, 12:33 PM

## 2023-05-10 NOTE — TOC Progression Note (Addendum)
Transition of Care [facility]) - Progression Note    Patient Details  Name: James Barber MRN: [id number] Date of Birth: Feb 14, 1946  Transition of Care [facility]) CM/SW Contact  Lorri Frederick, LCSW Phone Number: 05/10/2023, 10:08 AM  Clinical Narrative:   Bed offers provided to pt and wife, they are requesting response from [facility].  CSW reached out to that facility.  1145: [facility] is full, pt/wife updated. No decision yet.  1400: Wife reports they would like to accept offer at [facility].  Confirmed with [facility].  Auth request submitted in [place] and approved: [id number], 5 days: 6/7-6/11.    Expected Discharge Plan: Skilled Nursing Facility Barriers to Discharge: Continued Medical Work up, SNF Pending bed offer  Expected Discharge Plan and Services In-house Referral: Clinical Social Work   Post Acute Care Choice: Skilled Nursing Facility Living arrangements for the past 2 months: Single Family Home                                       Social Determinants of Health (SDOH) Interventions SDOH Screenings   Tobacco Use: Low Risk  (05/07/2023)    Readmission Risk Interventions     No data to display

## 2023-05-11 LAB — BPAM RBC
Blood Product Expiration Date: 202407012359
Blood Product Expiration Date: 202407022359
ISSUE DATE / TIME: 202406031633
Unit Type and Rh: 5100
Unit Type and Rh: 5100

## 2023-05-11 LAB — TYPE AND SCREEN
ABO/RH(D): O POS
Antibody Screen: NEGATIVE
Unit division: 0
Unit division: 0

## 2023-05-11 LAB — GLUCOSE, CAPILLARY: Glucose-Capillary: 135 mg/dL — ABNORMAL HIGH (ref 70–99)

## 2023-05-11 MED ORDER — OXYCODONE HCL 5 MG PO TABS: 5.0000 mg | ORAL_TABLET | ORAL | 0 refills | Status: DC | PRN

## 2023-05-11 MED ORDER — CEPHALEXIN 500 MG PO CAPS: 500.0000 mg | ORAL_CAPSULE | Freq: Three times a day (TID) | ORAL | 0 refills | Status: AC

## 2023-05-11 MED ORDER — METHOCARBAMOL 500 MG PO TABS: 500.0000 mg | ORAL_TABLET | Freq: Four times a day (QID) | ORAL | 3 refills | Status: DC | PRN

## 2023-05-11 MED ORDER — DEXAMETHASONE 1 MG PO TABS: ORAL_TABLET | ORAL | 0 refills | Status: DC

## 2023-05-11 MED FILL — Sodium Chloride IV Soln 0.9%: INTRAVENOUS | Qty: 1000 | Status: AC

## 2023-05-11 MED FILL — Sodium Chloride Irrigation Soln 0.9%: Qty: 3000 | Status: AC

## 2023-05-11 MED FILL — Heparin Sodium (Porcine) Inj 1000 Unit/ML: INTRAMUSCULAR | Qty: 30 | Status: AC

## 2023-05-11 NOTE — Progress Notes (Signed)
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    PT Comments    Pt was seen for mobility on RW to get down the hall, and to maneuver with transfers for various heights.  Pt was assisted to don back brace, and will continue to benefit from teaching for transfers and back precautions.  Follow along with him as his stay permits for strengthening, balance and back care.   Recommendations for follow up therapy are one component of a multi-disciplinary discharge planning process, led by the attending physician.  Recommendations may be updated based on patient status, additional functional criteria and insurance authorization.  Follow Up Recommendations  Can patient physically be transported by private vehicle: No    Assistance Recommended at Discharge Frequent or constant Supervision/Assistance  Patient can return home with the following A lot of help with walking and/or transfers;A lot of help with bathing/dressing/bathroom;Assist for transportation;Help with stairs or ramp for entrance   Equipment Recommendations  None recommended by PT    Recommendations for Other Services       Precautions / Restrictions Precautions Precautions: Back;Cervical Precaution Comments: use lumbar corset with mobility Required Braces or Orthoses: Spinal Brace Spinal Brace: Lumbar corset;Applied in sitting position Restrictions Weight Bearing Restrictions: No Other Position/Activity Restrictions: monitor neck posture     Mobility  Bed Mobility Overal bed mobility: Needs Assistance              General bed mobility comments: up to side of bed when PT enters    Transfers Overall transfer level: Needs assistance Equipment used: Rolling walker (2 wheels) Transfers: Sit to/from Stand Sit to Stand: Min guard, Min assist           General transfer comment: min guard to min assist with hand placement cues    Ambulation/Gait Ambulation/Gait assistance: Min guard Gait Distance (Feet): 160 Feet (sitting rest 1/2 way through) Assistive device: Rolling walker (2 wheels), 1 person hand held assist Gait Pattern/deviations: Step-through pattern, Wide base of support Gait velocity: reduced Gait velocity interpretation: <1.31 ft/sec, indicative of household ambulator Pre-gait activities: standing balance correction General Gait Details: better posture and maintenance of knee extension during gait, no buckling detected   Stairs             Wheelchair Mobility    Modified Rankin (Stroke Patients Only)       Balance     Sitting balance-Leahy Scale: Good       Standing balance-Leahy Scale: Poor                              Cognition Arousal/Alertness: Awake/alert Behavior During Therapy: WFL for tasks assessed/performed Overall Cognitive Status: History of cognitive impairments - at baseline                                 General Comments: Pt is attentive to instructions and requires cues for R side movement        Exercises      General Comments General comments (skin integrity, edema, etc.): Pt is more  controlled with walking and management of balance, has good stability of knees to walk and using RW with better direction.  Does tend to push himself a bit      Pertinent Vitals/Pain Pain Assessment Pain Assessment: 0-10 Pain Score: 7  Pain Location: lower back Pain Descriptors / Indicators: Guarding, Spasm Pain Intervention(s): Limited activity within patient's tolerance, Monitored during session, Premedicated before  session, Repositioned    Home Living                          Prior Function            PT Goals (current goals can now be found in the care plan section) Acute Rehab PT Goals Patient Stated Goal: get walking Progress towards PT goals: Progressing toward goals    Frequency    Min 5X/week      PT Plan Discharge plan needs to be updated    Co-evaluation              AM-PAC PT "6 Clicks" Mobility   Outcome Measure  Help needed turning from your back to your side while in a flat bed without using bedrails?: A Little Help needed moving from lying on your back to sitting on the side of a flat bed without using bedrails?: A Little Help needed moving to and from a bed to a chair (including a wheelchair)?: A Little Help needed standing up from a chair using your arms (e.g., wheelchair or bedside chair)?: A Little Help needed to walk in hospital room?: A Little Help needed climbing 3-5 steps with a railing? : Total 6 Click Score: 16    End of Session Equipment Utilized During Treatment: Gait belt Activity Tolerance: Patient tolerated treatment well;Patient limited by fatigue;Treatment limited secondary to medical complications (Comment) Patient left: with call bell/phone within reach;with family/visitor present;in chair Nurse Communication: Mobility status PT Visit Diagnosis: Unsteadiness on feet (R26.81);Muscle weakness (generalized) (M62.81);History of falling (Z91.81);Difficulty in walking, not elsewhere classified (R26.2);Pain Pain - part of body:  (back)     Time: 4782-9562 PT Time Calculation (min) (ACUTE ONLY): 24 min  Charges:  $Gait Training: 8-22 mins $Therapeutic Activity: 8-22 mins        Ivar Drape 05/11/2023, 1:16 PM  Samul Dada, PT PhD Acute Rehab Dept. Number: [facility] [id number] and [facility] [phone]

## 2023-05-11 NOTE — TOC Transition Note (Signed)
Transition of Care [facility]) - CM/SW Discharge Note   Patient Details  Name: James Barber MRN: [id number] Date of Birth: [birth year]  [facility]) CM/SW Contact:  Lorri Frederick, LCSW Phone Number: 05/11/2023, 11:16 AM   Clinical Narrative:   Pt [facility], room [facility], [facility] [place].  RN call report to [phone].      1000: CSW confirmed with [facility]--wife is signing paperwork, they can receive pt today.   Final next level of care: Skilled Nursing Facility Barriers to Discharge: Barriers Resolved   Patient Goals and CMS Choice CMS Medicare.gov Compare Post Acute Care list provided to:: Patient Represenative (must comment) (wife [name]) Choice offered to / list presented to : Patient, Spouse  Discharge Placement                [facility] at[facility]) Patient to be transferred to facility by: [facility] Name of family member notified: left message for wife [name] Patient and family notified of of transfer: 05/11/23  Discharge Plan and Services Additional resources added to the After Visit Summary for   In-house Referral: Clinical Social Work   Post Acute Care Choice: Skilled Nursing Facility                               Social Determinants of Health (SDOH) Interventions SDOH Screenings   Tobacco Use: Low Risk  (05/07/2023)     Readmission Risk Interventions     No data to display

## 2023-05-11 NOTE — Discharge Summary (Signed)
Physician Discharge Summary  Patient ID: James Barber MRN: [id number] DOB/AGE: [birth year] 77 y.o.  Admit date: 05/07/2023 Discharge date: 05/11/2023  Admission Diagnoses: Lumbar spondylosis and stenosis L4-L5 L5-S1 with severe right lumbar radiculopathy.  Spondylolisthesis L4-L5  Discharge Diagnoses: Lumbar spondylosis and stenosis L4-L5 L5-S1 with severe right lumbar radiculopathy.  Spondylolisthesis L4-L5.  Cervical myelopathy.  History of stroke. Principal Problem:   Spondylolisthesis at L4-L5 level   Discharged Condition: fair  Hospital Course: He was admitted to undergo surgical decompression and fusion L4-5 and L5-S1.  He tolerated surgery well.  He has significant weakness from previous myelopathy and will require an extended period of recuperation.  Consults: None  Significant Diagnostic Studies: None  Treatments: surgery: See op note  Discharge Exam: Blood pressure (!) 141/71, pulse 81, temperature 97.9 F (36.6 C), temperature source Oral, resp. rate 18, height 6' (1.829 m), weight 83 kg, SpO2 100 %. Incision is clean and dry.  Motor function reveals moderate spasticity in the lower extremities.  Tibialis anterior strength is 4 out of 5 on the right 5 out of 5 on the left.  Gastroc strength is intact.  Disposition: Discharge disposition: 03-Skilled Nursing Facility       Discharge Instructions     Call MD for:  redness, tenderness, or signs of infection (pain, swelling, redness, odor or green/yellow discharge around incision site)   Complete by: As directed    Call MD for:  severe uncontrolled pain   Complete by: As directed    Call MD for:  temperature >100.4   Complete by: As directed    Diet - low sodium heart healthy   Complete by: As directed    Discharge wound care:   Complete by: As directed    Change dressing daily.  Paint incision with Betadine.  Use dry gauze.  Dressing changes for 5 days.  Patient may shower.   Incentive spirometry RT   Complete  by: As directed    Increase activity slowly   Complete by: As directed       Allergies as of 05/11/2023   No Known Allergies      Medication List     TAKE these medications    acetaminophen 500 MG tablet Commonly known as: TYLENOL Take 500-1,000 mg by mouth every 6 (six) hours as needed (pain.).   aspirin EC 81 MG tablet Take 81 mg by mouth at bedtime.   atorvastatin 80 MG tablet Commonly known as: LIPITOR Take 80 mg by mouth at bedtime.   brimonidine 0.2 % ophthalmic solution Commonly known as: ALPHAGAN Place 1 drop into the left eye in the morning and at bedtime.   cephALEXin 500 MG capsule Commonly known as: KEFLEX Take 1 capsule (500 mg total) by mouth 3 (three) times daily for 10 days.   dexamethasone 1 MG tablet Commonly known as: DECADRON 2 tablets twice daily for 2 days, one tablet twice daily for 2 days, one tablet daily for 2 days.   DULoxetine 20 MG capsule Commonly known as: CYMBALTA Take 20 mg by mouth at bedtime.   ferrous sulfate 324 MG Tbec Take 324 mg by mouth at bedtime.   gabapentin 800 MG tablet Commonly known as: NEURONTIN Take 1,600 mg by mouth in the morning and at bedtime.   glipiZIDE 10 MG 24 hr tablet Commonly known as: GLUCOTROL XL Take 10 mg by mouth at bedtime.   Jardiance 25 MG Tabs tablet Generic drug: empagliflozin Take 25 mg by mouth in the morning.  levothyroxine 100 MCG tablet Commonly known as: SYNTHROID Take 100 mcg by mouth at bedtime.   lisinopril 20 MG tablet Commonly known as: ZESTRIL Resume one daily for systolic blood pressure 120 or greater.  If less call PCP and ask for recommendations.   methocarbamol 500 MG tablet Commonly known as: ROBAXIN Take 1 tablet (500 mg total) by mouth every 6 (six) hours as needed for muscle spasms.   omeprazole 20 MG tablet Commonly known as: PRILOSEC OTC Take 20 mg by mouth 2 (two) times a week. Sundays & Wednesdays ONLY   oxyCODONE 5 MG immediate release  tablet Commonly known as: Oxy IR/ROXICODONE Take 1-2 tablets (5-10 mg total) by mouth every 4 (four) hours as needed (Moderate and Severe pain). What changed:  medication strength when to take this reasons to take this additional instructions   rivaroxaban 10 MG Tabs tablet Commonly known as: XARELTO Take 10 mg by mouth every evening.   Systane Ultra 0.4-0.3 % Soln Generic drug: Polyethyl Glycol-Propyl Glycol Place 1-2 drops into both eyes 3 (three) times daily as needed (dry/irritated eyes.).   tiZANidine 4 MG tablet Commonly known as: ZANAFLEX Take 4 mg by mouth at bedtime.               Discharge Care Instructions  (From admission, onward)           Start     Ordered   05/11/23 0000  Discharge wound care:       Comments: Change dressing daily.  Paint incision with Betadine.  Use dry gauze.  Dressing changes for 5 days.  Patient may shower.   05/11/23 9528             Signed: Shary Key Tinsley Everman 05/11/2023, 7:29 AM

## 2023-07-30 NOTE — Therapy (Signed)
OUTPATIENT PHYSICAL THERAPY THORACOLUMBAR EVALUATION   Patient Name: James Barber MRN: [id number] DOB:Jan 29, 1946, 77 y.o., male Today's Date: 08/02/2023  END OF SESSION:  PT End of Session - 08/01/23 2213     Visit Number 1    Number of Visits 16    Date for PT Re-Evaluation 09/25/23    Authorization Type BCBS MCR    PT Start Time 1450    PT Stop Time 1544    PT Time Calculation (min) 54 min    Activity Tolerance Patient tolerated treatment well    Behavior During Therapy WFL for tasks assessed/performed             Past Medical History:  Diagnosis Date   Bowel obstruction (HCC)    Cancer (HCC)    prostate- "everything removed and clear"   Complication of anesthesia    Hard to wake and spikes fever   Diabetes mellitus without complication (HCC)    GERD (gastroesophageal reflux disease)    pt has not had issues with this in "a long time"   History of pulmonary embolus (PE)    Hypertension    Hypothyroidism    Ischemic stroke (HCC) 12/2022   Sleep apnea    Past Surgical History:  Procedure Laterality Date   ABDOMINAL SURGERY     "had ileostomy for about 4 months and had it reversed- due to salmonella infection- [place]   ANTERIOR CERVICAL DECOMP/DISCECTOMY FUSION  11/12/2012   Procedure: ANTERIOR CERVICAL DECOMPRESSION/DISCECTOMY FUSION 2 LEVELS;  Surgeon: Barnett Abu, MD;  Location: [facility];  Service: Neurosurgery;  Laterality: N/A;  Right Side approach Cervical six-seven,Cervical seven -thoracic one Anterior cervical decompression/diskectomy/fusion   BACK SURGERY  2023   x2 (outpatient with Dr. Danielle Dess)   CARPAL TUNNEL RELEASE Bilateral    CERVICAL FUSION     c 3-4, C 4-5, C5-6   HERNIA REPAIR     right inguinal   LAPAROTOMY N/A 03/22/2015   Procedure: DIAGONOSTIC LAPAROSCOPY, LAPROSCOPIC LYSIS OF ADHESIONS FOR ONE HOUR, DISTAL SMALL BOWEL RESECTION;  Surgeon: [name], MD;  Location: [facility];  Service: General;  Laterality: N/A;    Patient Active Problem List   Diagnosis Date Noted   Spondylolisthesis at L4-L5 level 05/07/2023   Acute pulmonary embolus (HCC) 04/19/2015   Rash and nonspecific skin eruption 04/19/2015   Diabetes mellitus, type II (HCC) 03/30/2015   Elevated LFTs 03/30/2015   Abnormal liver function tests 03/30/2015   Incisional hernia, without obstruction or gangrene 03/21/2015   Recurrent SBO (small bowel obstruction) 03/15/2015   HYPERLIPIDEMIA 03/09/2008   Iron deficiency anemia 03/09/2008   GERD 03/09/2008   Bilateral inguinal hernia (BIH), Left > Right 03/09/2008    REFERRING PROVIDER: Barnett Abu, MD  REFERRING DIAG: M54.16 (ICD-10-CM) - Radiculopathy, lumbar region  Rationale for Evaluation and Treatment: Rehabilitation  THERAPY DIAG:  Other low back pain  Muscle weakness (generalized)  ONSET DATE:  DOS 05/07/2023   SUBJECTIVE:  SUBJECTIVE STATEMENT: -Pt had injections for almost 2 years.  Pt had 2 prior surgeries in Sept and December 2023 including prior surgical decompression via laminotmoy, foraminotomy, and discectomy at L5-S1.  Pt  continued to have pain.     -Pt underwent bilat decompression L4-S1, posterior lumbar interbody arthrodesis L4-5 and L5-S1, with pedicle screw fixation L4 to sacrum posterolateral arthrodesis on 05/07/2023.  MD note on 6/26 indicated fusion is in good position.  Pt's wife states he has not lifted his B/L/T precautions.  Pt wore a corset after surgery which MD informed him he could stop wearing that 06/18/2023.    -Pt was having pain in bilat LE's to knees.  He reports having improved LE pain after surgery.  He is not having pain in distal LE's now.  He denies any N/T shooting down legs.  Pt does have numbness in foot from neuropathy.   -Pt began sleeping better last  week.  Pt has difficulty with performing stairs.  He has increased pain with performing transfers.  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  He has difficulty with his mobility at church.  Pt's wife states he has some difficulty with balance and pt states the pain throws his balance off.  Pt unable to woodwork and hasn't performed that in years due to back pain.   -PT order indicated postural exercises and LE strength.  MD note indicated back stabilization exercises also.     PERTINENT HISTORY:  L4-S1 fusion on 05/07/2023.  Pt has B/L/T restrictions.   Pt has had 2 prior lumbar surgeries.  Hx of 5 cervical surgeries including cervical fusion C2-T1.  DM type 2, peripheral neuropathy in bilat feet  CVA 12/2022--R sided visual deficits, some recall/processing deficits   Hx of PE in 2016 and 2021 HTN  PAIN:  NPRS:  3/10 current, 7-8/10 worst, 3/10 best Worst pain when standing up after sitting up. Location:  central and bilat lumbar flank pain.  Bilat posterior hips.  PRECAUTIONS: Back and Other: Lumbar fusion, B/L/T restrictions, peripheral neuropathy, hx of cervical fusion, CVA    WEIGHT BEARING RESTRICTIONS: No  FALLS:  Has patient fallen in last 6 months? Yes. Number of falls 1  LIVING ENVIRONMENT: Lives with: lives with their family Lives in: 2 story home, but stays on 1st floor mainly Stairs: yes  OCCUPATION:  Pt is retired  PLOF: Independent  PATIENT GOALS: improve mobility, balance, strength, and tolerance with household chores.  To be able to perform woodworking.    OBJECTIVE:   DIAGNOSTIC FINDINGS:  Pt is post op.  PATIENT SURVEYS:  FOTO 50 with a goal of 55 at visit 12    COGNITION: Overall cognitive status: Within functional limits for tasks assessed.  Pt has some recall/processing deficits and plans to return to speech.      INSPECTION: Clean, dry healed incision in lower back.    LOWER EXTREMITY MMT:    MMT  Right eval Left eval  Hip flexion    Hip extension    Hip abduction    Hip adduction    Hip internal rotation    Hip external rotation    Knee flexion 4/5 seated 4/5 seated  Knee extension 4/5 4+/5  Ankle dorsiflexion 5/5 5/5  Ankle plantarflexion WFL seated WFL seated  Ankle inversion    Ankle eversion     (Blank rows = not tested)   FUNCTIONAL TESTS:  5x STS:  17.75 sec without  using UE's  GAIT: Assistive device utilized: None Comments: slow gait, increased R foot ER, limited R arm swing.  Pt had 1 LOB, used the wall for assist.   TODAY'S TREATMENT:                                                                                                                                Pt received instruction in correct performance and palpation of TrA contraction.  Pt performed supine TrA contractions with cuing for form.  PT instructed pt in performing log rolling with bed mobility.  Pt performed log rolling on table with verbal and tactile cues for correct positioning and form.   PATIENT EDUCATION:  Education details: dx, objective findings, relevant anatomy, HEP, POC, and B/L/T restrictions.  Person educated: Patient Education method: Explanation, Demonstration, Tactile cues, Verbal cues, and Handouts Education comprehension: verbalized understanding, returned demonstration, verbal cues required, tactile cues required, and needs further education  HOME EXERCISE PROGRAM: Access Code: [email or web address] URL: [email or web address] Date: 07/31/2023 Prepared by: Aaron Edelman  Exercises - Supine Transversus Abdominis Bracing - Hands on Stomach  - 2 x daily - 7 x weekly - 2 sets - 10 reps - Log Roll  - 1 x daily - 1 x weekly - 1 sets - 1 reps  ASSESSMENT:  CLINICAL IMPRESSION: Patient is a 77 y.o. male 12 weeks and 1 day s/p L4-5 and L5-S1 fusion presenting to the clinic with expected post op findings of back pain and core and LE muscle weakness.  Pt has pain and is limited  with functional mobility including transfers and stairs.  Pt does a lot of walking at church on Sunday mornings and has difficulty with his mobility at church.  He requires multiple seated rest breaks.  Pt's states the pain throws his balance off at times.  Pt should benefit from skilled PT services per protocol to address impairments and to improve overall function.     OBJECTIVE IMPAIRMENTS: decreased activity tolerance, decreased balance, decreased endurance, decreased mobility, difficulty walking, decreased ROM, decreased strength, and pain.   ACTIVITY LIMITATIONS: carrying, lifting, bending, standing, squatting, stairs, transfers, and locomotion level  PARTICIPATION LIMITATIONS: cleaning, community activity, and church  PERSONAL FACTORS: 3+ comorbidities: prior back surgeries, cervical fusion, CVA, DM Type, peripheral neuropathy  are also affecting patient's functional outcome.   REHAB POTENTIAL: Good  CLINICAL DECISION MAKING: Stable/uncomplicated  EVALUATION COMPLEXITY: Low   GOALS:   SHORT TERM GOALS: Target date: 08/28/2023   Pt will be independent and compliant with HEP for improved pain, strength, and function. Baseline: Goal status: INITIAL  2.  Pt will progress with core exercises per protocol without adverse effects for improved strength and performance of functional mobility. Baseline:  Goal status: INITIAL  3.  Pt will demo correct form and be independent with log rolling. Baseline:  Goal status: INITIAL Target date:  08/14/2023  4.  Pt will demo increased gait speed with good stability.  Baseline:  Goal status: INITIAL Target date:  09/04/2023    LONG TERM GOALS: Target date: 09/25/2023  Pt will report he is able to perform transfers without increased pain.  Baseline:  Goal status: INITIAL  2.  Pt will report at least a 70% improvement in pain, sx's, and mobility overall  Baseline:  Goal status: INITIAL  3.  Pt will  report improved tolerance with  walking/duties at church on Sunday morning and be able to perform those duties without significant difficulty. Baseline:  Goal status: INITIAL  4.  Pt will demo improved core strength as evidenced by performance/progression of core exercises and improved bilat LE strength to 5/5 in hip flexion and knee flexion/extension for improved performance of and tolerance with functional mobility skills.  Baseline:  Goal status: INITIAL  5.  Pt will demo good control on stairs and report increased ease with performing stairs. Baseline:  Goal status: INITIAL   PLAN:  PT FREQUENCY: 2x/week  PT DURATION: 8 weeks  PLANNED INTERVENTIONS: Therapeutic exercises, Therapeutic activity, Neuromuscular re-education, Balance training, Gait training, Patient/Family education, Self Care, Joint mobilization, Stair training, Aquatic Therapy, Dry Needling, Electrical stimulation, Cryotherapy, Moist heat, scar mobilization, Taping, Manual therapy, and Re-evaluation.  PLAN FOR NEXT SESSION: Cont per lumbar fusion protocol.  Core strengthening per lumbar fusion protocol.    Audie Clear III PT, DPT 08/02/23 10:35 AM

## 2023-07-31 ENCOUNTER — Other Ambulatory Visit: Payer: Self-pay

## 2023-07-31 ENCOUNTER — Ambulatory Visit (HOSPITAL_BASED_OUTPATIENT_CLINIC_OR_DEPARTMENT_OTHER): Payer: Medicare Other | Attending: Neurological Surgery | Admitting: Physical Therapy

## 2023-07-31 DIAGNOSIS — M5459 Other low back pain: Secondary | ICD-10-CM | POA: Diagnosis present

## 2023-07-31 DIAGNOSIS — M6281 Muscle weakness (generalized): Secondary | ICD-10-CM | POA: Insufficient documentation

## 2023-08-01 ENCOUNTER — Encounter (HOSPITAL_BASED_OUTPATIENT_CLINIC_OR_DEPARTMENT_OTHER): Payer: Self-pay | Admitting: Physical Therapy

## 2023-08-16 ENCOUNTER — Ambulatory Visit (HOSPITAL_BASED_OUTPATIENT_CLINIC_OR_DEPARTMENT_OTHER): Payer: Medicare Other | Admitting: Physical Therapy

## 2023-08-17 ENCOUNTER — Ambulatory Visit (HOSPITAL_BASED_OUTPATIENT_CLINIC_OR_DEPARTMENT_OTHER): Payer: Medicare Other | Attending: Neurological Surgery | Admitting: Physical Therapy

## 2023-08-17 ENCOUNTER — Encounter (HOSPITAL_BASED_OUTPATIENT_CLINIC_OR_DEPARTMENT_OTHER): Payer: Self-pay | Admitting: Physical Therapy

## 2023-08-17 ENCOUNTER — Ambulatory Visit (HOSPITAL_BASED_OUTPATIENT_CLINIC_OR_DEPARTMENT_OTHER): Payer: Medicare Other | Admitting: Physical Therapy

## 2023-08-17 DIAGNOSIS — M5459 Other low back pain: Secondary | ICD-10-CM

## 2023-08-17 DIAGNOSIS — M6281 Muscle weakness (generalized): Secondary | ICD-10-CM | POA: Diagnosis present

## 2023-08-17 NOTE — Therapy (Signed)
OUTPATIENT PHYSICAL THERAPY THORACOLUMBAR EVALUATION   Patient Name: James Barber MRN: [id number] DOB:[birth year], 77 y.o., male Today's Date: 08/17/2023  END OF SESSION:  PT End of Session - 08/17/23 2319     Visit Number 2    Number of Visits 16    Date for PT Re-Evaluation 09/25/23    Authorization Type [employer] MCR    PT Start Time 1153    PT Stop Time 1233    PT Time Calculation (min) 40 min    Activity Tolerance Patient tolerated treatment well;No increased pain    Behavior During Therapy WFL for tasks assessed/performed              Past Medical History:  Diagnosis Date   Bowel obstruction (HCC)    Cancer (HCC)    prostate- "everything removed and clear"   Complication of anesthesia    Hard to wake and spikes fever   Diabetes mellitus without complication (HCC)    GERD (gastroesophageal reflux disease)    pt has not had issues with this in "a long time"   History of pulmonary embolus (PE)    Hypertension    Hypothyroidism    Ischemic stroke (HCC) 12/2022   Sleep apnea    Past Surgical History:  Procedure Laterality Date   ABDOMINAL SURGERY     "had ileostomy for about 4 months and had it reversed- due to salmonella infection- [place]   ANTERIOR CERVICAL DECOMP/DISCECTOMY FUSION  11/12/2012   Procedure: ANTERIOR CERVICAL DECOMPRESSION/DISCECTOMY FUSION 2 LEVELS;  Surgeon: Barnett Abu, MD;  Location: [facility];  Service: Neurosurgery;  Laterality: N/A;  Right Side approach Cervical six-seven,Cervical seven -thoracic one Anterior cervical decompression/diskectomy/fusion   BACK SURGERY  2023   x2 (outpatient with Dr. Danielle Dess)   CARPAL TUNNEL RELEASE Bilateral    CERVICAL FUSION     c 3-4, C 4-5, C5-6   HERNIA REPAIR     right inguinal   LAPAROTOMY N/A 03/22/2015   Procedure: DIAGONOSTIC LAPAROSCOPY, LAPROSCOPIC LYSIS OF ADHESIONS FOR ONE HOUR, DISTAL SMALL BOWEL RESECTION;  Surgeon: [name], MD;  Location: [facility];  Service: General;   Laterality: N/A;   Patient Active Problem List   Diagnosis Date Noted   Spondylolisthesis at L4-L5 level 05/07/2023   Acute pulmonary embolus (HCC) 04/19/2015   Rash and nonspecific skin eruption 04/19/2015   Diabetes mellitus, type II (HCC) 03/30/2015   Elevated LFTs 03/30/2015   Abnormal liver function tests 03/30/2015   Incisional hernia, without obstruction or gangrene 03/21/2015   Recurrent SBO (small bowel obstruction) 03/15/2015   HYPERLIPIDEMIA 03/09/2008   Iron deficiency anemia 03/09/2008   GERD 03/09/2008   Bilateral inguinal hernia (BIH), Left > Right 03/09/2008    REFERRING PROVIDER: Barnett Abu, MD  REFERRING DIAG: M54.16 (ICD-10-CM) - Radiculopathy, lumbar region  Rationale for Evaluation and Treatment: Rehabilitation  THERAPY DIAG:  Other low back pain  Muscle weakness (generalized)  ONSET DATE:  DOS 05/07/2023   SUBJECTIVE:  SUBJECTIVE STATEMENT: Pt is 14 weeks and 4 days s/p L4-S1 fusion.  Pt denies any adverse effects after prior Rx.  Pt has been performing his HEP.  Pt has pain in bilat lower lumbar flanks, post hips, and post thighs with mobility.  Pt states he has balance issues.        PERTINENT HISTORY:  L4-S1 fusion on 05/07/2023.  Pt has B/L/T restrictions.   Pt has had 2 prior lumbar surgeries.  Hx of 5 cervical surgeries including cervical fusion C2-T1.  DM type 2, peripheral neuropathy in bilat feet  CVA 12/2022--R sided visual deficits, some recall/processing deficits   Hx of PE in 2016 and 2021 HTN  PAIN:  NPRS:  3/10 current, 7-8/10 worst, 3/10 best Worst pain when standing up after sitting up. Location:  L sided lumbar.     PRECAUTIONS: Back and Other: Lumbar fusion, B/L/T restrictions, peripheral neuropathy, hx of cervical fusion, CVA    WEIGHT  BEARING RESTRICTIONS: No  FALLS:  Has patient fallen in last 6 months? Yes. Number of falls 1  LIVING ENVIRONMENT: Lives with: lives with their family Lives in: 2 story home, but stays on 1st floor mainly Stairs: yes  OCCUPATION:  Pt is retired  PLOF: Independent  PATIENT GOALS: improve mobility, balance, strength, and tolerance with household chores.  To be able to perform woodworking.    OBJECTIVE:   DIAGNOSTIC FINDINGS:  Pt is post op.      TODAY'S TREATMENT:                                                                                                                                PT reviewed log roll.  Pt received instruction in correct performance and palpation of TrA contraction.    Pt performed:   supine TrA contractions    Supine marching with TrA contraction 2x10   Supine SLR with TrA approx 9 reps on R and 6 reps on L    Supine clams with YTB and RTB with TrA x 10 reps each   Supine manual HS stretch 3x20 sec bilat    LAQ 2x10   Standing on airex with FA 2x35-40 sec and with NBOS 1x30 sec   PT updated HEP and gave pt a HEP handout.  Pt was educated in correct form and appropriate frequency.  PT instructed pt he should not have pain with HEP.   PATIENT EDUCATION:  Education details: dx, exercise form, relevant anatomy, HEP, POC, and rationale of interventions Person educated: Patient Education method: Explanation, Demonstration, Tactile cues, Verbal cues, and Handouts Education comprehension: verbalized understanding, returned demonstration, verbal cues required, tactile cues required, and needs further education  HOME EXERCISE PROGRAM: Access Code: [email or web address] URL: [email or web address] Date: 07/31/2023 Prepared by: Aaron Edelman  Exercises - Supine Transversus Abdominis Bracing - Hands on Stomach  - 2 x daily - 7 x weekly - 2 sets - 10 reps - Log Roll  - 1  x daily - 1 x weekly - 1 sets - 1 reps  Updated HEP: - Supine March  - 1 x daily  - 7 x weekly - 2 sets - 10 reps - Hooklying Clamshell with Resistance  - 1 x daily - 4-5 x weekly - 2 sets - 10 reps - Seated Long Arc Quad  - 1 x daily - 7 x weekly - 2 sets - 10 reps  ASSESSMENT:  CLINICAL IMPRESSION: Patient demonstrates good form with log rolling.  Pt performed exercises per protocol well with cuing and instruction in correct form.  He required instruction for correct form with TrA contractions and demonstrated good form with cuing/instruction and increased repetitions.  Pt was fatigued with supine SLR.  PT updated HEP and gave pt a HEP handout.  Pt demonstrates good understanding of HEP.  He tolerated treatment well and had no increased pain after Rx.  Pt should benefit from continued skilled PT services per protocol to address goals and impairments and to improve overall function.     OBJECTIVE IMPAIRMENTS: decreased activity tolerance, decreased balance, decreased endurance, decreased mobility, difficulty walking, decreased ROM, decreased strength, and pain.   ACTIVITY LIMITATIONS: carrying, lifting, bending, standing, squatting, stairs, transfers, and locomotion level  PARTICIPATION LIMITATIONS: cleaning, community activity, and church  PERSONAL FACTORS: 3+ comorbidities: prior back surgeries, cervical fusion, CVA, DM Type, peripheral neuropathy  are also affecting patient's functional outcome.   REHAB POTENTIAL: Good  CLINICAL DECISION MAKING: Stable/uncomplicated  EVALUATION COMPLEXITY: Low   GOALS:   SHORT TERM GOALS: Target date: 08/28/2023   Pt will be independent and compliant with HEP for improved pain, strength, and function. Baseline: Goal status: INITIAL  2.  Pt will progress with core exercises per protocol without adverse effects for improved strength and performance of functional mobility. Baseline:  Goal status: INITIAL  3.  Pt will demo correct form and be independent with log rolling. Baseline:  Goal status: INITIAL Target date:   08/14/2023  4.  Pt will demo increased gait speed with good stability.   Baseline:  Goal status: INITIAL Target date:  09/04/2023    LONG TERM GOALS: Target date: 09/25/2023  Pt will report he is able to perform transfers without increased pain.  Baseline:  Goal status: INITIAL  2.  Pt will report at least a 70% improvement in pain, sx's, and mobility overall  Baseline:  Goal status: INITIAL  3.  Pt will  report improved tolerance with walking/duties at church on Sunday morning and be able to perform those duties without significant difficulty. Baseline:  Goal status: INITIAL  4.  Pt will demo improved core strength as evidenced by performance/progression of core exercises and improved bilat LE strength to 5/5 in hip flexion and knee flexion/extension for improved performance of and tolerance with functional mobility skills.  Baseline:  Goal status: INITIAL  5.  Pt will demo good control on stairs and report increased ease with performing stairs. Baseline:  Goal status: INITIAL   PLAN:  PT FREQUENCY: 2x/week  PT DURATION: 8 weeks  PLANNED INTERVENTIONS: Therapeutic exercises, Therapeutic activity, Neuromuscular re-education, Balance training, Gait training, Patient/Family education, Self Care, Joint mobilization, Stair training, Aquatic Therapy, Dry Needling, Electrical stimulation, Cryotherapy, Moist heat, scar mobilization, Taping, Manual therapy, and Re-evaluation.  PLAN FOR NEXT SESSION: Cont per lumbar fusion protocol.  Core strengthening per lumbar fusion protocol.    Audie Clear III PT, DPT 08/17/23 11:38 PM

## 2023-08-22 ENCOUNTER — Encounter (HOSPITAL_BASED_OUTPATIENT_CLINIC_OR_DEPARTMENT_OTHER): Payer: Self-pay | Admitting: Physical Therapy

## 2023-08-22 ENCOUNTER — Ambulatory Visit (HOSPITAL_BASED_OUTPATIENT_CLINIC_OR_DEPARTMENT_OTHER): Payer: Medicare Other | Admitting: Physical Therapy

## 2023-08-22 DIAGNOSIS — M5459 Other low back pain: Secondary | ICD-10-CM | POA: Diagnosis not present

## 2023-08-22 DIAGNOSIS — M6281 Muscle weakness (generalized): Secondary | ICD-10-CM

## 2023-08-22 NOTE — Therapy (Signed)
OUTPATIENT PHYSICAL THERAPY TREATMENT   Patient Name: James Barber MRN: [id number] DOB:[birth year], 77 y.o., male Today's Date: 08/23/2023  END OF SESSION:  PT End of Session - 08/22/23 1102     Visit Number 3    Number of Visits 16    Date for PT Re-Evaluation 09/25/23    Authorization Type [employer]BS MCR    PT Start Time 1025    PT Stop Time 1107    PT Time Calculation (min) 42 min    Activity Tolerance Patient tolerated treatment well;No increased pain    Behavior During Therapy WFL for tasks assessed/performed               Past Medical History:  Diagnosis Date   Bowel obstruction (HCC)    Cancer (HCC)    prostate- "everything removed and clear"   Complication of anesthesia    Hard to wake and spikes fever   Diabetes mellitus without complication (HCC)    GERD (gastroesophageal reflux disease)    pt has not had issues with this in "a long time"   History of pulmonary embolus (PE)    Hypertension    Hypothyroidism    Ischemic stroke (HCC) 12/2022   Sleep apnea    Past Surgical History:  Procedure Laterality Date   ABDOMINAL SURGERY     "had ileostomy for about 4 months and had it reversed- due to salmonella infection- this happened in [place]"   ANTERIOR CERVICAL DECOMP/DISCECTOMY FUSION  11/12/2012   Procedure: ANTERIOR CERVICAL DECOMPRESSION/DISCECTOMY FUSION 2 LEVELS;  Surgeon: Barnett Abu, MD;  Location: [facility];  Service: Neurosurgery;  Laterality: N/A;  Right Side approach Cervical six-seven,Cervical seven -thoracic one Anterior cervical decompression/diskectomy/fusion   BACK SURGERY  2023   x2 (outpatient with Dr. Danielle Dess)   CARPAL TUNNEL RELEASE Bilateral    CERVICAL FUSION     c 3-4, C 4-5, C5-6   HERNIA REPAIR     right inguinal   LAPAROTOMY N/A 03/22/2015   Procedure: DIAGONOSTIC LAPAROSCOPY, LAPROSCOPIC LYSIS OF ADHESIONS FOR ONE HOUR, DISTAL SMALL BOWEL RESECTION;  Surgeon: [name], MD;  Location: [facility];  Service: General;  Laterality:  N/A;   Patient Active Problem List   Diagnosis Date Noted   Spondylolisthesis at L4-L5 level 05/07/2023   Acute pulmonary embolus (HCC) 04/19/2015   Rash and nonspecific skin eruption 04/19/2015   Diabetes mellitus, type II (HCC) 03/30/2015   Elevated LFTs 03/30/2015   Abnormal liver function tests 03/30/2015   Incisional hernia, without obstruction or gangrene 03/21/2015   Recurrent SBO (small bowel obstruction) 03/15/2015   HYPERLIPIDEMIA 03/09/2008   Iron deficiency anemia 03/09/2008   GERD 03/09/2008   Bilateral inguinal hernia (BIH), Left > Right 03/09/2008    REFERRING PROVIDER: Barnett Abu, MD  REFERRING DIAG: M54.16 (ICD-10-CM) - Radiculopathy, lumbar region  Rationale for Evaluation and Treatment: Rehabilitation  THERAPY DIAG:  Other low back pain  Muscle weakness (generalized)  ONSET DATE:  DOS 05/07/2023   SUBJECTIVE:  SUBJECTIVE STATEMENT: Pt is 15 weeks and 2 days s/p L4-S1 fusion.  Pt denies any adverse effects after prior Rx.  Pt is more active on Sundays due to walking through the Sunday school classes.  He has an aching pain while walking to the Sunday school classes and states it wears him out.  Pt has been performing his HEP.  Pt has pain in bilat lower lumbar flanks, post hips, and post thighs with mobility.  Pt states he has balance issues.        PERTINENT HISTORY:  L4-S1 fusion on 05/07/2023.  Pt has B/L/T restrictions.   Pt has had 2 prior lumbar surgeries.  Hx of 5 cervical surgeries including cervical fusion C2-T1.  DM type 2, peripheral neuropathy in bilat feet  CVA 12/2022--R sided visual deficits, some recall/processing deficits   Hx of PE in 2016 and 2021 HTN  PAIN:  NPRS:  3/10 current, 7-8/10 worst, 3/10 best Worst pain when standing up after sitting  up. Location:  L sided lumbar.    Pt states his R shoulder is bothering him too.  He's had shoulder pain for awhile and is seeing MD soon.   PRECAUTIONS: Back and Other: Lumbar fusion, B/L/T restrictions, peripheral neuropathy, hx of cervical fusion, CVA    WEIGHT BEARING RESTRICTIONS: No  FALLS:  Has patient fallen in last 6 months? Yes. Number of falls 1  LIVING ENVIRONMENT: Lives with: lives with their family Lives in: 2 story home, but stays on 1st floor mainly Stairs: yes  OCCUPATION:  Pt is retired  PLOF: Independent  PATIENT GOALS: improve mobility, balance, strength, and tolerance with household chores.  To be able to perform woodworking.    OBJECTIVE:   DIAGNOSTIC FINDINGS:  Pt is post op.      TODAY'S TREATMENT:                                                                                                                                  Pt performed:   Supine marching with TrA contraction x10   Supine alt UE/LE approx 10-12 reps   Supine SLR with TrA x 10 reps bilat    Supine clams with RTB with TrA 2 x 10 reps each   S/L clams 2x10 with TrA   Supine manual HS stretch 2x20 sec bilat    supine HS stretch with strap x 20 sec bilat   LAQ x10   LAQ with ankle pumps for nn flossing 2 sets of approx 8-10 reps   Standing on airex with NBOS 2x30 sec    PT updated HEP and gave pt a HEP handout.  Pt was educated in correct form and appropriate frequency.  PT instructed pt he should not have pain with HEP.   PATIENT EDUCATION:  Education details: dx, exercise form, relevant anatomy, HEP, POC, and rationale of interventions Person educated: Patient Education method: Explanation, Demonstration, Tactile cues, Verbal cues, and Handouts  Education comprehension: verbalized understanding, returned demonstration, verbal cues required, tactile cues required, and needs further education  HOME EXERCISE PROGRAM: Access Code: [email or web address] URL:  [email or web address] Date: 07/31/2023 Prepared by: Aaron Edelman  Exercises - Supine Transversus Abdominis Bracing - Hands on Stomach  - 2 x daily - 7 x weekly - 2 sets - 10 reps - Log Roll  - 1 x daily - 1 x weekly - 1 sets - 1 reps  Updated HEP: - Supine March  - 1 x daily - 7 x weekly - 2 sets - 10 reps - Hooklying Clamshell with Resistance  - 1 x daily - 4-5 x weekly - 2 sets - 10 reps - Seated Long Arc Quad  - 1 x daily - 7 x weekly - 2 sets - 10 reps  ASSESSMENT:  CLINICAL IMPRESSION: PT progressed exercises per protocol and pt had good tolerance with exercises.  Pt reports having ongoing R shoulder pain which he could feel with supine alt UE/LE and supine HS stretch with strap.  PT instructed pt to stop exercise if he is having increased R shoulder pain.  Pt demonstrates improved performance of supine SLR.   He tolerated treatment well and had no increased pain after Rx.  Pt should benefit from continued skilled PT services per protocol to address goals and impairments and to improve overall function.     OBJECTIVE IMPAIRMENTS: decreased activity tolerance, decreased balance, decreased endurance, decreased mobility, difficulty walking, decreased ROM, decreased strength, and pain.   ACTIVITY LIMITATIONS: carrying, lifting, bending, standing, squatting, stairs, transfers, and locomotion level  PARTICIPATION LIMITATIONS: cleaning, community activity, and church  PERSONAL FACTORS: 3+ comorbidities: prior back surgeries, cervical fusion, CVA, DM Type, peripheral neuropathy  are also affecting patient's functional outcome.   REHAB POTENTIAL: Good  CLINICAL DECISION MAKING: Stable/uncomplicated  EVALUATION COMPLEXITY: Low   GOALS:   SHORT TERM GOALS: Target date: 08/28/2023   Pt will be independent and compliant with HEP for improved pain, strength, and function. Baseline: Goal status: INITIAL  2.  Pt will progress with core exercises per protocol without  adverse effects for improved strength and performance of functional mobility. Baseline:  Goal status: INITIAL  3.  Pt will demo correct form and be independent with log rolling. Baseline:  Goal status: INITIAL Target date:  08/14/2023  4.  Pt will demo increased gait speed with good stability.   Baseline:  Goal status: INITIAL Target date:  09/04/2023    LONG TERM GOALS: Target date: 09/25/2023  Pt will report he is able to perform transfers without increased pain.  Baseline:  Goal status: INITIAL  2.  Pt will report at least a 70% improvement in pain, sx's, and mobility overall  Baseline:  Goal status: INITIAL  3.  Pt will  report improved tolerance with walking/duties at church on Sunday morning and be able to perform those duties without significant difficulty. Baseline:  Goal status: INITIAL  4.  Pt will demo improved core strength as evidenced by performance/progression of core exercises and improved bilat LE strength to 5/5 in hip flexion and knee flexion/extension for improved performance of and tolerance with functional mobility skills.  Baseline:  Goal status: INITIAL  5.  Pt will demo good control on stairs and report increased ease with performing stairs. Baseline:  Goal status: INITIAL   PLAN:  PT FREQUENCY: 2x/week  PT DURATION: 8 weeks  PLANNED INTERVENTIONS: Therapeutic exercises, Therapeutic activity, Neuromuscular re-education, Balance training, Gait training, Patient/Family education, Self Care, Joint mobilization, Stair  training, Aquatic Therapy, Dry Needling, Electrical stimulation, Cryotherapy, Moist heat, scar mobilization, Taping, Manual therapy, and Re-evaluation.  PLAN FOR NEXT SESSION: Cont per lumbar fusion protocol.  Core strengthening per lumbar fusion protocol.    Audie Clear III PT, DPT 08/23/23 1:37 PM

## 2023-08-24 ENCOUNTER — Ambulatory Visit (HOSPITAL_BASED_OUTPATIENT_CLINIC_OR_DEPARTMENT_OTHER): Payer: Medicare Other | Admitting: Physical Therapy

## 2023-08-24 ENCOUNTER — Encounter (HOSPITAL_BASED_OUTPATIENT_CLINIC_OR_DEPARTMENT_OTHER): Payer: Self-pay | Admitting: Physical Therapy

## 2023-08-24 DIAGNOSIS — M5459 Other low back pain: Secondary | ICD-10-CM

## 2023-08-24 DIAGNOSIS — M6281 Muscle weakness (generalized): Secondary | ICD-10-CM

## 2023-08-24 NOTE — Therapy (Signed)
OUTPATIENT PHYSICAL THERAPY TREATMENT   Patient Name: James Barber MRN: [id number] DOB:[birth year], 77 y.o., male Today's Date: 08/24/2023  END OF SESSION:  PT End of Session - 08/24/23 1035     Visit Number 4    Number of Visits 16    Date for PT Re-Evaluation 09/25/23    Authorization Type BC[employer] MCR    PT Start Time 0940    PT Stop Time 1023    PT Time Calculation (min) 43 min    Activity Tolerance Patient tolerated treatment well;No increased pain    Behavior During Therapy WFL for tasks assessed/performed                Past Medical History:  Diagnosis Date   Bowel obstruction (HCC)    Cancer (HCC)    prostate- "everything removed and clear"   Complication of anesthesia    Hard to wake and spikes fever   Diabetes mellitus without complication (HCC)    GERD (gastroesophageal reflux disease)    pt has not had issues with this in "a long time"   History of pulmonary embolus (PE)    Hypertension    Hypothyroidism    Ischemic stroke (HCC) 12/2022   Sleep apnea    Past Surgical History:  Procedure Laterality Date   ABDOMINAL SURGERY     "had ileostomy for about 4 months and had it reversed- due to salmonella infection- [place]   ANTERIOR CERVICAL DECOMP/DISCECTOMY FUSION  11/12/2012   Procedure: ANTERIOR CERVICAL DECOMPRESSION/DISCECTOMY FUSION 2 LEVELS;  Surgeon: Barnett Abu, MD;  Location: [facility];  Service: Neurosurgery;  Laterality: N/A;  Right Side approach Cervical six-seven,Cervical seven -thoracic one Anterior cervical decompression/diskectomy/fusion   BACK SURGERY  2023   x2 (outpatient with Dr. Danielle Dess)   CARPAL TUNNEL RELEASE Bilateral    CERVICAL FUSION     c 3-4, C 4-5, C5-6   HERNIA REPAIR     right inguinal   LAPAROTOMY N/A 03/22/2015   Procedure: DIAGONOSTIC LAPAROSCOPY, LAPROSCOPIC LYSIS OF ADHESIONS FOR ONE HOUR, DISTAL SMALL BOWEL RESECTION;  Surgeon: [name], MD;  Location: [facility];  Service: General;   Laterality: N/A;   Patient Active Problem List   Diagnosis Date Noted   Spondylolisthesis at L4-L5 level 05/07/2023   Acute pulmonary embolus (HCC) 04/19/2015   Rash and nonspecific skin eruption 04/19/2015   Diabetes mellitus, type II (HCC) 03/30/2015   Elevated LFTs 03/30/2015   Abnormal liver function tests 03/30/2015   Incisional hernia, without obstruction or gangrene 03/21/2015   Recurrent SBO (small bowel obstruction) 03/15/2015   HYPERLIPIDEMIA 03/09/2008   Iron deficiency anemia 03/09/2008   GERD 03/09/2008   Bilateral inguinal hernia (BIH), Left > Right 03/09/2008    REFERRING PROVIDER: Barnett Abu, MD  REFERRING DIAG: M54.16 (ICD-10-CM) - Radiculopathy, lumbar region  Rationale for Evaluation and Treatment: Rehabilitation  THERAPY DIAG:  Other low back pain  Muscle weakness (generalized)  ONSET DATE:  DOS 05/07/2023   SUBJECTIVE:  SUBJECTIVE STATEMENT: Pt is 15 weeks and 4 days s/p L4-S1 fusion.  Pt states he did too much after prior Rx and was pretty sore.  He performed a lot of errands after PT.  He continues to have soreness.  Pt reports increased pain with ambulation.  He reports pain with movement.          PERTINENT HISTORY:  L4-S1 fusion on 05/07/2023.  Pt has B/L/T restrictions.   Pt has had 2 prior lumbar surgeries.  Hx of 5 cervical surgeries including cervical fusion C2-T1.  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 Location:  bilat sides of lumbar worse on L side.    Pt states his R shoulder is bothering him too.  He's had shoulder pain for awhile  and is seeing MD soon.   PRECAUTIONS: Back and Other: Lumbar fusion, B/L/T restrictions, peripheral neuropathy, hx of cervical fusion, CVA    WEIGHT BEARING RESTRICTIONS: No  FALLS:  Has patient fallen in last 6 months? Yes. Number of falls 1  LIVING ENVIRONMENT: Lives with: lives with their family Lives in: 2 story home, but stays on 1st floor mainly Stairs: yes  OCCUPATION:  Pt is retired  PLOF: Independent  PATIENT GOALS: improve mobility, balance, strength, and tolerance with household chores.  To be able to perform woodworking.    OBJECTIVE:   DIAGNOSTIC FINDINGS:  Pt is post op.      TODAY'S TREATMENT:                                                                                                                                  Pt performed:   Supine marching with TrA contraction x10   Supine alt UE/LE x 10 reps   Supine alt LE ext with TrA 2x10   Supine SLR with TrA 2 x 10 reps bilat    S/L clams 2x10 with TrA   Supine manual HS stretch 3x20 sec bilat    LAQ x10   LAQ with ankle pumps for nn flossing 2 sets of approx 8-10 reps   Mini squats with TrA with Ue's on rail     PATIENT EDUCATION:  Education details: dx, exercise form, relevant anatomy, HEP, POC, and rationale of interventions Person educated: Patient Education method: Explanation, Demonstration, Tactile cues, Verbal cues, and Handouts Education comprehension: verbalized understanding, returned demonstration, verbal cues required, tactile cues required, and needs further education  HOME EXERCISE PROGRAM: Access Code: [email or web address] URL: [email or web address] Date: 07/31/2023 Prepared by: Aaron Edelman  Exercises - Supine Transversus Abdominis Bracing - Hands on Stomach  - 2 x daily - 7 x weekly - 2 sets - 10 reps - Log Roll  - 1 x daily -  1 x weekly - 1 sets - 1 reps - Supine March  - 1 x daily - 7 x weekly - 2 sets - 10 reps - Hooklying Clamshell with Resistance  - 1 x daily - 4-5  x weekly - 2 sets - 10 reps - Seated Long Arc Quad  - 1 x daily - 7 x weekly - 2 sets - 10 reps  ASSESSMENT:  CLINICAL IMPRESSION: PT progressed exercises per protocol and pt had good tolerance with exercises.  Pt performed exercises well with cuing and instruction in correct form.  Pt has pain in R shoulder which he states was present before lumbar fusion.  He was able to perform supine alt UE/LE though did have some shoulder pain.  Pt did require instruction in correct form with squats and he has no c/o's with squats.  Pt responded well to Rx having no change in pain after Rx.  Pt should benefit from continued skilled PT services per protocol to address goals and impairments and to improve overall function.     OBJECTIVE IMPAIRMENTS: decreased activity tolerance, decreased balance, decreased endurance, decreased mobility, difficulty walking, decreased ROM, decreased strength, and pain.   ACTIVITY LIMITATIONS: carrying, lifting, bending, standing, squatting, stairs, transfers, and locomotion level  PARTICIPATION LIMITATIONS: cleaning, community activity, and church  PERSONAL FACTORS: 3+ comorbidities: prior back surgeries, cervical fusion, CVA, DM Type, peripheral neuropathy  are also affecting patient's functional outcome.   REHAB POTENTIAL: Good  CLINICAL DECISION MAKING: Stable/uncomplicated  EVALUATION COMPLEXITY: Low   GOALS:   SHORT TERM GOALS: Target date: 08/28/2023   Pt will be independent and compliant with HEP for improved pain, strength, and function. Baseline: Goal status: INITIAL  2.  Pt will progress with core exercises per protocol without adverse effects for improved strength and performance of functional mobility. Baseline:  Goal status: INITIAL  3.  Pt will demo correct form and be independent with log rolling. Baseline:  Goal status: INITIAL Target date:  08/14/2023  4.  Pt will demo increased gait speed with good stability.   Baseline:  Goal status:  INITIAL Target date:  09/04/2023    LONG TERM GOALS: Target date: 09/25/2023  Pt will report he is able to perform transfers without increased pain.  Baseline:  Goal status: INITIAL  2.  Pt will report at least a 70% improvement in pain, sx's, and mobility overall  Baseline:  Goal status: INITIAL  3.  Pt will  report improved tolerance with walking/duties at church on Sunday morning and be able to perform those duties without significant difficulty. Baseline:  Goal status: INITIAL  4.  Pt will demo improved core strength as evidenced by performance/progression of core exercises and improved bilat LE strength to 5/5 in hip flexion and knee flexion/extension for improved performance of and tolerance with functional mobility skills.  Baseline:  Goal status: INITIAL  5.  Pt will demo good control on stairs and report increased ease with performing stairs. Baseline:  Goal status: INITIAL   PLAN:  PT FREQUENCY: 2x/week  PT DURATION: 8 weeks  PLANNED INTERVENTIONS: Therapeutic exercises, Therapeutic activity, Neuromuscular re-education, Balance training, Gait training, Patient/Family education, Self Care, Joint mobilization, Stair training, Aquatic Therapy, Dry Needling, Electrical stimulation, Cryotherapy, Moist heat, scar mobilization, Taping, Manual therapy, and Re-evaluation.  PLAN FOR NEXT SESSION: Cont per lumbar fusion protocol.  Core strengthening per lumbar fusion protocol.    Audie Clear III PT, DPT 08/24/23 4:27 PM

## 2023-08-28 ENCOUNTER — Ambulatory Visit (HOSPITAL_BASED_OUTPATIENT_CLINIC_OR_DEPARTMENT_OTHER): Payer: Medicare Other | Admitting: Physical Therapy

## 2023-08-28 DIAGNOSIS — M5459 Other low back pain: Secondary | ICD-10-CM | POA: Diagnosis not present

## 2023-08-28 DIAGNOSIS — M6281 Muscle weakness (generalized): Secondary | ICD-10-CM

## 2023-08-28 NOTE — Therapy (Unsigned)
OUTPATIENT PHYSICAL THERAPY TREATMENT   Patient Name: James Barber MRN: [id number] DOB:1946-06-26, 77 y.o., male Today's Date: 08/29/2023  END OF SESSION:  PT End of Session - 08/28/23        Visit Number 5    Number of Visits 16     Date for PT Re-Evaluation 09/25/23     Authorization Type [employer] MCR     PT Start Time 1027     PT Stop Time 1113     PT Time Calculation (min) 46 min     Activity Tolerance Patient tolerated treatment well;No increased pain     Behavior During Therapy WFL for tasks assessed/performed         Past Medical History:  Diagnosis Date   Bowel obstruction (HCC)    Cancer (HCC)    prostate- "everything removed and clear"   Complication of anesthesia    Hard to wake and spikes fever   Diabetes mellitus without complication (HCC)    GERD (gastroesophageal reflux disease)    pt has not had issues with this in "a long time"   History of pulmonary embolus (PE)    Hypertension    Hypothyroidism    Ischemic stroke (HCC) 12/2022   Sleep apnea    Past Surgical History:  Procedure Laterality Date   ABDOMINAL SURGERY     "had ileostomy for about 4 months and had it reversed- due to salmonella infection- this happened in [place]"   ANTERIOR CERVICAL DECOMP/DISCECTOMY FUSION  11/12/2012   Procedure: ANTERIOR CERVICAL DECOMPRESSION/DISCECTOMY FUSION 2 LEVELS;  Surgeon: Barnett Abu, MD;  Location: [facility];  Service: Neurosurgery;  Laterality: N/A;  Right Side approach Cervical six-seven,Cervical seven -thoracic one Anterior cervical decompression/diskectomy/fusion   BACK SURGERY  2023   x2 (outpatient with Dr. Danielle Dess)   CARPAL TUNNEL RELEASE Bilateral    CERVICAL FUSION     c 3-4, C 4-5, C5-6   HERNIA REPAIR     right inguinal   LAPAROTOMY N/A 03/22/2015   Procedure: DIAGONOSTIC LAPAROSCOPY, LAPROSCOPIC LYSIS OF ADHESIONS FOR ONE HOUR, DISTAL SMALL BOWEL RESECTION;  Surgeon: [name], MD;  Location: [facility];  Service: General;  Laterality:  N/A;   Patient Active Problem List   Diagnosis Date Noted   Spondylolisthesis at L4-L5 level 05/07/2023   Acute pulmonary embolus (HCC) 04/19/2015   Rash and nonspecific skin eruption 04/19/2015   Diabetes mellitus, type II (HCC) 03/30/2015   Elevated LFTs 03/30/2015   Abnormal liver function tests 03/30/2015   Incisional hernia, without obstruction or gangrene 03/21/2015   Recurrent SBO (small bowel obstruction) 03/15/2015   HYPERLIPIDEMIA 03/09/2008   Iron deficiency anemia 03/09/2008   GERD 03/09/2008   Bilateral inguinal hernia (BIH), Left > Right 03/09/2008    REFERRING PROVIDER: Barnett Abu, MD  REFERRING DIAG: M54.16 (ICD-10-CM) - Radiculopathy, lumbar region  Rationale for Evaluation and Treatment: Rehabilitation  THERAPY DIAG:  Other low back pain  Muscle weakness (generalized)  ONSET DATE:  DOS 05/07/2023   SUBJECTIVE:  SUBJECTIVE STATEMENT: Pt is 16 weeks and 1 day s/p L4-S1 fusion.  Pt reports he is in much less pain than before he had surgery.  Pt reports he was very fatigued with his Sunday activities at church.  He has discomfort/pain with movement.  Pt states he is challenged with trying to stand up straight after sitting down.  Pt denies any adverse effects after prior Rx.       PERTINENT HISTORY:  L4-S1 fusion on 05/07/2023.  Pt has B/L/T restrictions.   Pt has had 2 prior lumbar surgeries.  Hx of 5 cervical surgeries including cervical fusion C2-T1.  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 Location:  bilat sides of lumbar worse on L side.    Pt states  his R shoulder is bothering him too.  He's had shoulder pain for awhile and is seeing MD soon.   PRECAUTIONS: Back and Other: Lumbar fusion, B/L/T restrictions, peripheral neuropathy, hx of cervical fusion, CVA    WEIGHT BEARING RESTRICTIONS: No  FALLS:  Has patient fallen in last 6 months? Yes. Number of falls 1  LIVING ENVIRONMENT: Lives with: lives with their family Lives in: 2 story home, but stays on 1st floor mainly Stairs: yes  OCCUPATION:  Pt is retired  PLOF: Independent  PATIENT GOALS: improve mobility, balance, strength, and tolerance with household chores.  To be able to perform woodworking.    OBJECTIVE:   DIAGNOSTIC FINDINGS:  Pt is post op.      TODAY'S TREATMENT:                                                                                                                                  Pt performed:   Supine marching with TrA contraction x15   Supine alt LE ext with TrA 2x10   Supine SLR with TrA 2 x 10 reps bilat    S/L clams 2x10 with TrA   Supine manual HS stretch 3x20 sec bilat    LAQ 2x10 with RTB   Mini squats with TrA with Ue's on rail 2x10     PATIENT EDUCATION:  Education details: dx, exercise form, relevant anatomy, HEP, POC, and rationale of interventions.  Cont with HEP as instructed. Person educated: Patient Education method: Explanation, Demonstration, Tactile cues, Verbal cues, and Handouts Education comprehension: verbalized understanding, returned demonstration, verbal cues required, tactile cues required, and needs further education  HOME EXERCISE PROGRAM: Access Code: [email or web address] URL: [email or web address] Date: 07/31/2023 Prepared by: Aaron Edelman  Exercises - Supine Transversus Abdominis Bracing - Hands on Stomach  - 2 x daily - 7 x weekly - 2 sets - 10 reps - Log Roll  - 1 x daily - 1 x  weekly - 1 sets - 1 reps - Supine March  - 1 x daily - 7 x weekly - 2 sets - 10 reps - Hooklying Clamshell with  Resistance  - 1 x daily - 4-5 x weekly - 2 sets - 10 reps - Seated Long Arc Quad  - 1 x daily - 7 x weekly - 2 sets - 10 reps  ASSESSMENT:  CLINICAL IMPRESSION: Pt performed exercises per protocol well with cuing and instruction in correct form.  Pt requires instruction in correct form with squats including appropriate squatting depth.  He had good tolerance with exercises.  Pt continues to have increased fatigue with increased ambulation on Sunday when he is performing Sunday School activities.  Pt responded well to Rx having no change in pain after Rx.  Pt should benefit from continued skilled PT services per protocol to address goals and impairments and to improve overall function.     OBJECTIVE IMPAIRMENTS: decreased activity tolerance, decreased balance, decreased endurance, decreased mobility, difficulty walking, decreased ROM, decreased strength, and pain.   ACTIVITY LIMITATIONS: carrying, lifting, bending, standing, squatting, stairs, transfers, and locomotion level  PARTICIPATION LIMITATIONS: cleaning, community activity, and church  PERSONAL FACTORS: 3+ comorbidities: prior back surgeries, cervical fusion, CVA, DM Type, peripheral neuropathy  are also affecting patient's functional outcome.   REHAB POTENTIAL: Good  CLINICAL DECISION MAKING: Stable/uncomplicated  EVALUATION COMPLEXITY: Low   GOALS:   SHORT TERM GOALS: Target date: 08/28/2023   Pt will be independent and compliant with HEP for improved pain, strength, and function. Baseline: Goal status: INITIAL  2.  Pt will progress with core exercises per protocol without adverse effects for improved strength and performance of functional mobility. Baseline:  Goal status: INITIAL  3.  Pt will demo correct form and be independent with log rolling. Baseline:  Goal status: INITIAL Target date:  08/14/2023  4.  Pt will demo increased gait speed with good stability.   Baseline:  Goal status: INITIAL Target date:   09/04/2023    LONG TERM GOALS: Target date: 09/25/2023  Pt will report he is able to perform transfers without increased pain.  Baseline:  Goal status: INITIAL  2.  Pt will report at least a 70% improvement in pain, sx's, and mobility overall  Baseline:  Goal status: INITIAL  3.  Pt will  report improved tolerance with walking/duties at church on Sunday morning and be able to perform those duties without significant difficulty. Baseline:  Goal status: INITIAL  4.  Pt will demo improved core strength as evidenced by performance/progression of core exercises and improved bilat LE strength to 5/5 in hip flexion and knee flexion/extension for improved performance of and tolerance with functional mobility skills.  Baseline:  Goal status: INITIAL  5.  Pt will demo good control on stairs and report increased ease with performing stairs. Baseline:  Goal status: INITIAL   PLAN:  PT FREQUENCY: 2x/week  PT DURATION: 8 weeks  PLANNED INTERVENTIONS: Therapeutic exercises, Therapeutic activity, Neuromuscular re-education, Balance training, Gait training, Patient/Family education, Self Care, Joint mobilization, Stair training, Aquatic Therapy, Dry Needling, Electrical stimulation, Cryotherapy, Moist heat, scar mobilization, Taping, Manual therapy, and Re-evaluation.  PLAN FOR NEXT SESSION: Cont per lumbar fusion protocol.  Core strengthening per lumbar fusion protocol.    Audie Clear III PT, DPT 08/29/23 9:21 PM

## 2023-08-29 ENCOUNTER — Encounter (HOSPITAL_BASED_OUTPATIENT_CLINIC_OR_DEPARTMENT_OTHER): Payer: Self-pay | Admitting: Physical Therapy

## 2023-08-30 ENCOUNTER — Ambulatory Visit (HOSPITAL_BASED_OUTPATIENT_CLINIC_OR_DEPARTMENT_OTHER): Payer: Medicare Other | Admitting: Physical Therapy

## 2023-08-30 ENCOUNTER — Encounter (HOSPITAL_BASED_OUTPATIENT_CLINIC_OR_DEPARTMENT_OTHER): Payer: Self-pay | Admitting: Physical Therapy

## 2023-08-30 DIAGNOSIS — M5459 Other low back pain: Secondary | ICD-10-CM | POA: Diagnosis not present

## 2023-08-30 DIAGNOSIS — M6281 Muscle weakness (generalized): Secondary | ICD-10-CM

## 2023-09-04 ENCOUNTER — Ambulatory Visit (HOSPITAL_BASED_OUTPATIENT_CLINIC_OR_DEPARTMENT_OTHER): Payer: Medicare Other | Attending: Neurological Surgery | Admitting: Physical Therapy

## 2023-09-04 DIAGNOSIS — G8929 Other chronic pain: Secondary | ICD-10-CM | POA: Insufficient documentation

## 2023-09-04 DIAGNOSIS — M25611 Stiffness of right shoulder, not elsewhere classified: Secondary | ICD-10-CM | POA: Diagnosis present

## 2023-09-04 DIAGNOSIS — M5459 Other low back pain: Secondary | ICD-10-CM | POA: Insufficient documentation

## 2023-09-04 DIAGNOSIS — M25511 Pain in right shoulder: Secondary | ICD-10-CM | POA: Diagnosis present

## 2023-09-04 DIAGNOSIS — M6281 Muscle weakness (generalized): Secondary | ICD-10-CM | POA: Diagnosis present

## 2023-09-04 NOTE — Therapy (Signed)
OUTPATIENT PHYSICAL THERAPY TREATMENT   Patient Name: James Barber MRN: [id number] DOB:1946-09-11, 77 y.o., male Today's Date: 09/05/2023  END OF SESSION:   PT End of Session - 09/04/23        Visit Number 7     Number of Visits 16     Date for PT Re-Evaluation 09/25/23     Authorization Type BCBS MCR     PT Start Time 1025     PT Stop Time 1108     PT Time Calculation (min) 43 min     Activity Tolerance Patient tolerated treatment well;No increased pain     Behavior During Therapy WFL for tasks assessed/performed           Past Medical History:  Diagnosis Date   Bowel obstruction (HCC)    Cancer (HCC)    prostate- "everything removed and clear"   Complication of anesthesia    Hard to wake and spikes fever   Diabetes mellitus without complication (HCC)    GERD (gastroesophageal reflux disease)    pt has not had issues with this in "a long time"   History of pulmonary embolus (PE)    Hypertension    Hypothyroidism    Ischemic stroke (HCC) 12/2022   Sleep apnea    Past Surgical History:  Procedure Laterality Date   ABDOMINAL SURGERY     "had ileostomy for about 4 months and had it reversed- due to salmonella infection- [place]   ANTERIOR CERVICAL DECOMP/DISCECTOMY FUSION  11/12/2012   Procedure: ANTERIOR CERVICAL DECOMPRESSION/DISCECTOMY FUSION 2 LEVELS;  Surgeon: Barnett Abu, MD;  Location: [facility];  Service: Neurosurgery;  Laterality: N/A;  Right Side approach Cervical six-seven,Cervical seven -thoracic one Anterior cervical decompression/diskectomy/fusion   BACK SURGERY  2023   x2 (outpatient with Dr. Danielle Dess)   CARPAL TUNNEL RELEASE Bilateral    CERVICAL FUSION     c 3-4, C 4-5, C5-6   HERNIA REPAIR     right inguinal   LAPAROTOMY N/A 03/22/2015   Procedure: DIAGONOSTIC LAPAROSCOPY, LAPROSCOPIC LYSIS OF ADHESIONS FOR ONE HOUR, DISTAL SMALL BOWEL RESECTION;  Surgeon: [name], MD;  Location: [facility];  Service: General;   Laterality: N/A;   Patient Active Problem List   Diagnosis Date Noted   Spondylolisthesis at L4-L5 level 05/07/2023   Acute pulmonary embolus (HCC) 04/19/2015   Rash and nonspecific skin eruption 04/19/2015   Diabetes mellitus, type II (HCC) 03/30/2015   Elevated LFTs 03/30/2015   Abnormal liver function tests 03/30/2015   Incisional hernia, without obstruction or gangrene 03/21/2015   Recurrent SBO (small bowel obstruction) 03/15/2015   HYPERLIPIDEMIA 03/09/2008   Iron deficiency anemia 03/09/2008   GERD 03/09/2008   Bilateral inguinal hernia (BIH), Left > Right 03/09/2008    REFERRING PROVIDER: Barnett Abu, MD  REFERRING DIAG: M54.16 (ICD-10-CM) - Radiculopathy, lumbar region  Rationale for Evaluation and Treatment: Rehabilitation  THERAPY DIAG:  Other low back pain  Muscle weakness (generalized)  ONSET DATE:  DOS 05/07/2023   SUBJECTIVE:  SUBJECTIVE STATEMENT: Pt is 17 weeks and 1 day s/p L4-S1 fusion.  Pt reports no increased pain or soreness after prior Rx though could tell he had a workout.  Pt saw MD yesterday for his R shoulder.  He states he was dx'd with bursitis and received a cortisone shot.  He states his shoulder is feeling better.  MD ordered PT for shoulder.  Pt states "Balance is my big issue".  Pt has been performing his HEP though didn't perform S/L clamshells.    PERTINENT HISTORY:  L4-S1 fusion on 05/07/2023.  Pt has B/L/T restrictions.   Pt has had 2 prior lumbar surgeries.  Hx of 5 cervical surgeries including cervical fusion C2-T1.  DM type 2, peripheral neuropathy in bilat feet  CVA 12/2022--R sided visual deficits, some recall/processing deficits   Hx of PE in 2016 and 2021 HTN  PAIN:  NPRS:  2/10 current at rest, 7-8/10 worst, 3/10 best Worst pain when  standing up after sitting up. Location:  R post hip > lumbar.    Pt states his R shoulder is bothering him too.  He's had shoulder pain for awhile and is seeing MD soon.   PRECAUTIONS: Back and Other: Lumbar fusion, B/L/T restrictions, peripheral neuropathy, hx of cervical fusion, CVA    WEIGHT BEARING RESTRICTIONS: No  FALLS:  Has patient fallen in last 6 months? Yes. Number of falls 1  LIVING ENVIRONMENT: Lives with: lives with their family Lives in: 2 story home, but stays on 1st floor mainly Stairs: yes  OCCUPATION:  Pt is retired  PLOF: Independent  PATIENT GOALS: improve mobility, balance, strength, and tolerance with household chores.  To be able to perform woodworking.    OBJECTIVE:   DIAGNOSTIC FINDINGS:  Pt is post op.      TODAY'S TREATMENT:                                                                                                                                 Therapeutic Exercise:  Pt performed:   Supine marching with TrA contraction x15   Supine alt LE ext with TrA 2x10   Supine SLR with TrA 2 x 10 reps bilat   S/L clams 2x10 bilat with RTB with TrA   Supine manual HS stretch 3x20 sec bilat    LAQ 2x10 with RTB   LAQ with AP's x10    Neuro re-ed Activities:   Marching on airex 2x10   Tandem stance 2x20 sec bilat with UE support   Standing on airex with EC with min assist for LOB 2x20 sec      PATIENT EDUCATION:  Education details: dx, exercise form, relevant anatomy, HEP, POC, and rationale of interventions.  Cont with HEP as instructed. Person educated: Patient Education method: Explanation, Demonstration, Tactile cues, Verbal cues Education comprehension: verbalized understanding, returned demonstration, verbal cues required, tactile cues required, and needs further education  HOME EXERCISE PROGRAM: Access Code: [email or web address]  URL: [email or web address] Date: 07/31/2023 Prepared by: Aaron Edelman  Exercises - Supine  Transversus Abdominis Bracing - Hands on Stomach  - 2 x daily - 7 x weekly - 2 sets - 10 reps - Log Roll  - 1 x daily - 1 x weekly - 1 sets - 1 reps - Supine March  - 1 x daily - 7 x weekly - 2 sets - 10 reps - Hooklying Clamshell with Resistance  - 1 x daily - 4-5 x weekly - 2 sets - 10 reps - Seated Long Arc Quad  - 1 x daily - 7 x weekly - 2 sets - 10 reps  UPDATED HEP: - Knee extension with Ankle Pumps  - 1 x daily - 7 x weekly - 2 sets - Clamshell  - 1 x daily - 5 x weekly - 2 sets - 10 reps   ASSESSMENT:  CLINICAL IMPRESSION: Pt is progressing well with tolerance to exercises and core strength per protocol.  He performed exercises per protocol well with cuing and instruction in correct form.  PT didn't perform exercises with UE involvement due to shoulder pain and recent cortisone shot.  PT included balance exercises and pt was challenged with balance exercises.  Pt has neuropathy which affects his proprioception.  He responded well to Rx having no change in pain though was fatigued after Rx.  Pt should benefit from continued skilled PT services per protocol to address goals and impairments and to improve overall function.   OBJECTIVE IMPAIRMENTS: decreased activity tolerance, decreased balance, decreased endurance, decreased mobility, difficulty walking, decreased ROM, decreased strength, and pain.   ACTIVITY LIMITATIONS: carrying, lifting, bending, standing, squatting, stairs, transfers, and locomotion level  PARTICIPATION LIMITATIONS: cleaning, community activity, and church  PERSONAL FACTORS: 3+ comorbidities: prior back surgeries, cervical fusion, CVA, DM Type, peripheral neuropathy  are also affecting patient's functional outcome.   REHAB POTENTIAL: Good  CLINICAL DECISION MAKING: Stable/uncomplicated  EVALUATION COMPLEXITY: Low   GOALS:   SHORT TERM GOALS: Target date: 08/28/2023   Pt will be independent and compliant with HEP for improved pain, strength, and  function. Baseline: Goal status: PROGRESSING  2.  Pt will progress with core exercises per protocol without adverse effects for improved strength and performance of functional mobility. Baseline:  Goal status: GOAL MET  3.  Pt will demo correct form and be independent with log rolling. Baseline:  Goal status: GOAL MET Target date:  08/14/2023  4.  Pt will demo increased gait speed with good stability.   Baseline:  Goal status: INITIAL Target date:  09/04/2023    LONG TERM GOALS: Target date: 09/25/2023  Pt will report he is able to perform transfers without increased pain.  Baseline:  Goal status: INITIAL  2.  Pt will report at least a 70% improvement in pain, sx's, and mobility overall  Baseline:  Goal status: INITIAL  3.  Pt will  report improved tolerance with walking/duties at church on Sunday morning and be able to perform those duties without significant difficulty. Baseline:  Goal status: INITIAL  4.  Pt will demo improved core strength as evidenced by performance/progression of core exercises and improved bilat LE strength to 5/5 in hip flexion and knee flexion/extension for improved performance of and tolerance with functional mobility skills.  Baseline:  Goal status: INITIAL  5.  Pt will demo good control on stairs and report increased ease with performing stairs. Baseline:  Goal status: INITIAL   PLAN:  PT FREQUENCY: 2x/week  PT DURATION:  8 weeks  PLANNED INTERVENTIONS: Therapeutic exercises, Therapeutic activity, Neuromuscular re-education, Balance training, Gait training, Patient/Family education, Self Care, Joint mobilization, Stair training, Aquatic Therapy, Dry Needling, Electrical stimulation, Cryotherapy, Moist heat, scar mobilization, Taping, Manual therapy, and Re-evaluation.  PLAN FOR NEXT SESSION: Cont per lumbar fusion protocol.  Core strengthening per lumbar fusion protocol.    Audie Clear III PT, DPT 09/05/23 8:23 PM

## 2023-09-05 ENCOUNTER — Encounter (HOSPITAL_BASED_OUTPATIENT_CLINIC_OR_DEPARTMENT_OTHER): Payer: Self-pay | Admitting: Physical Therapy

## 2023-09-06 ENCOUNTER — Ambulatory Visit (HOSPITAL_BASED_OUTPATIENT_CLINIC_OR_DEPARTMENT_OTHER): Payer: Medicare Other | Admitting: Physical Therapy

## 2023-09-06 DIAGNOSIS — M5459 Other low back pain: Secondary | ICD-10-CM

## 2023-09-06 DIAGNOSIS — M6281 Muscle weakness (generalized): Secondary | ICD-10-CM

## 2023-09-06 NOTE — Therapy (Signed)
OUTPATIENT PHYSICAL THERAPY TREATMENT   Patient Name: James Barber MRN: [id number] DOB:May 27, 1946, 77 y.o., male Today's Date: 09/07/2023  END OF SESSION:  PT End of Session - 09/06/23 1150     Visit Number 8    Number of Visits 16    Date for PT Re-Evaluation 09/25/23    Authorization Type BCBS MCR    PT Start Time 1106    PT Stop Time 1148    PT Time Calculation (min) 42 min    Activity Tolerance Patient tolerated treatment well;No increased pain    Behavior During Therapy WFL for tasks assessed/performed                   Past Medical History:  Diagnosis Date   Bowel obstruction (HCC)    Cancer (HCC)    prostate- "everything removed and clear"   Complication of anesthesia    Hard to wake and spikes fever   Diabetes mellitus without complication (HCC)    GERD (gastroesophageal reflux disease)    pt has not had issues with this in "a long time"   History of pulmonary embolus (PE)    Hypertension    Hypothyroidism    Ischemic stroke (HCC) 12/2022   Sleep apnea    Past Surgical History:  Procedure Laterality Date   ABDOMINAL SURGERY     "had ileostomy for about 4 months and had it reversed- due to salmonella infection- this happened in [place]"   ANTERIOR CERVICAL DECOMP/DISCECTOMY FUSION  11/12/2012   Procedure: ANTERIOR CERVICAL DECOMPRESSION/DISCECTOMY FUSION 2 LEVELS;  Surgeon: Barnett Abu, MD;  Location: [facility];  Service: Neurosurgery;  Laterality: N/A;  Right Side approach Cervical six-seven,Cervical seven -thoracic one Anterior cervical decompression/diskectomy/fusion   BACK SURGERY  2023   x2 (outpatient with Dr. Danielle Dess)   CARPAL TUNNEL RELEASE Bilateral    CERVICAL FUSION     c 3-4, C 4-5, C5-6   HERNIA REPAIR     right inguinal   LAPAROTOMY N/A 03/22/2015   Procedure: DIAGONOSTIC LAPAROSCOPY, LAPROSCOPIC LYSIS OF ADHESIONS FOR ONE HOUR, DISTAL SMALL BOWEL RESECTION;  Surgeon: [name], MD;  Location: [facility];  Service: General;   Laterality: N/A;   Patient Active Problem List   Diagnosis Date Noted   Spondylolisthesis at L4-L5 level 05/07/2023   Acute pulmonary embolus (HCC) 04/19/2015   Rash and nonspecific skin eruption 04/19/2015   Diabetes mellitus, type II (HCC) 03/30/2015   Elevated LFTs 03/30/2015   Abnormal liver function tests 03/30/2015   Incisional hernia, without obstruction or gangrene 03/21/2015   Recurrent SBO (small bowel obstruction) 03/15/2015   HYPERLIPIDEMIA 03/09/2008   Iron deficiency anemia 03/09/2008   GERD 03/09/2008   Bilateral inguinal hernia (BIH), Left > Right 03/09/2008    REFERRING PROVIDER: Barnett Abu, MD  REFERRING DIAG: M54.16 (ICD-10-CM) - Radiculopathy, lumbar region  Rationale for Evaluation and Treatment: Rehabilitation  THERAPY DIAG:  Other low back pain  Muscle weakness (generalized)  ONSET DATE:  DOS 05/07/2023   SUBJECTIVE:  SUBJECTIVE STATEMENT: Pt is 17 weeks and 1 day s/p L4-S1 fusion.  Pt states he can see some minor things that are going the right direction.  Pt reports improved ability to stand up and walk a straight line.  "a bit more stability".  Pt denies any adverse effects after prior Rx.   PERTINENT HISTORY:  L4-S1 fusion on 05/07/2023.  Pt has B/L/T restrictions.   Pt has had 2 prior lumbar surgeries.  Hx of 5 cervical surgeries including cervical fusion C2-T1.  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 Location:  lumbar.    Pt states his R shoulder is bothering him too.  He's had shoulder pain for awhile and is seeing MD soon.    PRECAUTIONS: Back and Other: Lumbar fusion, B/L/T restrictions, peripheral neuropathy, hx of cervical fusion, CVA    WEIGHT BEARING RESTRICTIONS: No  FALLS:  Has patient fallen in last 6 months? Yes. Number of falls 1  LIVING ENVIRONMENT: Lives with: lives with their family Lives in: 2 story home, but stays on 1st floor mainly Stairs: yes  OCCUPATION:  Pt is retired  PLOF: Independent  PATIENT GOALS: improve mobility, balance, strength, and tolerance with household chores.  To be able to perform woodworking.    OBJECTIVE:   DIAGNOSTIC FINDINGS:  Pt is post op.      TODAY'S TREATMENT:                                                                                                                                 Therapeutic Exercise:  Pt performed:   Supine marching with TrA contraction x15   Supine alt LE ext with TrA 2x10   Supine SLR with TrA 2 x 10 reps bilat   S/L clams 2x10 bilat with RTB with TrA   Supine manual HS stretch x20 sec bilat, supine HS stretch with strap 2x20 sec bilat    LAQ 2x10 with RTB   LAQ with 10 AP's for 2 sets bilat    Neuro re-ed Activities:   Marching on airex 2x10 with UE support   Tandem stance 2x20 sec bilat with UE support   Tandem gait x 2 laps at rail with SBA and with UE support on rail         PATIENT EDUCATION:  Education details: dx, exercise form, relevant anatomy, HEP, POC, and rationale of interventions.  Cont with HEP as instructed. Person educated: Patient Education method: Explanation, Demonstration, Tactile cues, Verbal cues Education comprehension: verbalized understanding, returned demonstration, verbal cues required, tactile cues required, and needs further education  HOME EXERCISE PROGRAM: Access Code: [email or web address] URL: [email or web address] Date: 07/31/2023 Prepared by: Aaron Edelman  Exercises - Supine Transversus Abdominis Bracing -  Hands on Stomach  - 2 x daily - 7 x weekly - 2 sets - 10  reps - Log Roll  - 1 x daily - 1 x weekly - 1 sets - 1 reps - Supine March  - 1 x daily - 7 x weekly - 2 sets - 10 reps - Hooklying Clamshell with Resistance  - 1 x daily - 4-5 x weekly - 2 sets - 10 reps - Seated Long Arc Quad  - 1 x daily - 7 x weekly - 2 sets - 10 reps  UPDATED HEP: - Knee extension with Ankle Pumps  - 1 x daily - 7 x weekly - 2 sets - Clamshell  - 1 x daily - 5 x weekly - 2 sets - 10 reps   ASSESSMENT:  CLINICAL IMPRESSION: Pt is noticing some improvement as evidenced by subjective reports.  PT is progressing exercises per protocol and pt is tolerating progression well.  He performed exercises per protocol well with cuing and instruction in correct form.  Pt able to perform supine HS stretch with strap well without c/o's.  PT added tandem gait to improve balance and pt was challenged.  He requires UE support on rail to perform tandem gait.  He responded well to Rx having no c/o's and no increased pain after Rx.  Pt should benefit from continued skilled PT services per protocol to address goals and impairments and to improve overall function.   OBJECTIVE IMPAIRMENTS: decreased activity tolerance, decreased balance, decreased endurance, decreased mobility, difficulty walking, decreased ROM, decreased strength, and pain.   ACTIVITY LIMITATIONS: carrying, lifting, bending, standing, squatting, stairs, transfers, and locomotion level  PARTICIPATION LIMITATIONS: cleaning, community activity, and church  PERSONAL FACTORS: 3+ comorbidities: prior back surgeries, cervical fusion, CVA, DM Type, peripheral neuropathy  are also affecting patient's functional outcome.   REHAB POTENTIAL: Good  CLINICAL DECISION MAKING: Stable/uncomplicated  EVALUATION COMPLEXITY: Low   GOALS:   SHORT TERM GOALS: Target date: 08/28/2023   Pt will be independent and compliant with HEP for improved pain, strength, and function. Baseline: Goal status: PROGRESSING  2.  Pt will progress with  core exercises per protocol without adverse effects for improved strength and performance of functional mobility. Baseline:  Goal status: GOAL MET  3.  Pt will demo correct form and be independent with log rolling. Baseline:  Goal status: GOAL MET Target date:  08/14/2023  4.  Pt will demo increased gait speed with good stability.   Baseline:  Goal status: INITIAL Target date:  09/04/2023    LONG TERM GOALS: Target date: 09/25/2023  Pt will report he is able to perform transfers without increased pain.  Baseline:  Goal status: INITIAL  2.  Pt will report at least a 70% improvement in pain, sx's, and mobility overall  Baseline:  Goal status: INITIAL  3.  Pt will  report improved tolerance with walking/duties at church on Sunday morning and be able to perform those duties without significant difficulty. Baseline:  Goal status: INITIAL  4.  Pt will demo improved core strength as evidenced by performance/progression of core exercises and improved bilat LE strength to 5/5 in hip flexion and knee flexion/extension for improved performance of and tolerance with functional mobility skills.  Baseline:  Goal status: INITIAL  5.  Pt will demo good control on stairs and report increased ease with performing stairs. Baseline:  Goal status: INITIAL   PLAN:  PT FREQUENCY: 2x/week  PT DURATION: 8 weeks  PLANNED INTERVENTIONS: Therapeutic exercises,  Therapeutic activity, Neuromuscular re-education, Balance training, Gait training, Patient/Family education, Self Care, Joint mobilization, Stair training, Aquatic Therapy, Dry Needling, Electrical stimulation, Cryotherapy, Moist heat, scar mobilization, Taping, Manual therapy, and Re-evaluation.  PLAN FOR NEXT SESSION: Cont per lumbar fusion protocol.  Core strengthening per lumbar fusion protocol.    Audie Clear III PT, DPT 09/07/23 5:10 PM

## 2023-09-07 ENCOUNTER — Encounter (HOSPITAL_BASED_OUTPATIENT_CLINIC_OR_DEPARTMENT_OTHER): Payer: Self-pay | Admitting: Physical Therapy

## 2023-09-11 ENCOUNTER — Ambulatory Visit (HOSPITAL_BASED_OUTPATIENT_CLINIC_OR_DEPARTMENT_OTHER): Payer: Medicare Other | Admitting: Physical Therapy

## 2023-09-11 DIAGNOSIS — M6281 Muscle weakness (generalized): Secondary | ICD-10-CM

## 2023-09-11 DIAGNOSIS — M5459 Other low back pain: Secondary | ICD-10-CM

## 2023-09-11 NOTE — Therapy (Signed)
OUTPATIENT PHYSICAL THERAPY TREATMENT   Patient Name: James Barber MRN: [id number] DOB:04-10-1946, 77 y.o., male Today's Date: 09/12/2023  END OF SESSION:  PT End of Session - 09/11/23 1033     Visit Number 9    Number of Visits 16    Date for PT Re-Evaluation 09/25/23    Authorization Type BCBS MCR    PT Start Time 1028    PT Stop Time 1108    PT Time Calculation (min) 40 min    Activity Tolerance Patient tolerated treatment well;No increased pain    Behavior During Therapy WFL for tasks assessed/performed                   Past Medical History:  Diagnosis Date   Bowel obstruction (HCC)    Cancer (HCC)    prostate- "everything removed and clear"   Complication of anesthesia    Hard to wake and spikes fever   Diabetes mellitus without complication (HCC)    GERD (gastroesophageal reflux disease)    pt has not had issues with this in "a long time"   History of pulmonary embolus (PE)    Hypertension    Hypothyroidism    Ischemic stroke (HCC) 12/2022   Sleep apnea    Past Surgical History:  Procedure Laterality Date   ABDOMINAL SURGERY     "had ileostomy for about 4 months and had it reversed- due to salmonella infection- [place]   ANTERIOR CERVICAL DECOMP/DISCECTOMY FUSION  11/12/2012   Procedure: ANTERIOR CERVICAL DECOMPRESSION/DISCECTOMY FUSION 2 LEVELS;  Surgeon: Barnett Abu, MD;  Location: [facility];  Service: Neurosurgery;  Laterality: N/A;  Right Side approach Cervical six-seven,Cervical seven -thoracic one Anterior cervical decompression/diskectomy/fusion   BACK SURGERY  2023   x2 (outpatient with Dr. Danielle Dess)   CARPAL TUNNEL RELEASE Bilateral    CERVICAL FUSION     c 3-4, C 4-5, C5-6   HERNIA REPAIR     right inguinal   LAPAROTOMY N/A 03/22/2015   Procedure: DIAGONOSTIC LAPAROSCOPY, LAPROSCOPIC LYSIS OF ADHESIONS FOR ONE HOUR, DISTAL SMALL BOWEL RESECTION;  Surgeon: [name], MD;  Location: [facility];  Service: General;   Laterality: N/A;   Patient Active Problem List   Diagnosis Date Noted   Spondylolisthesis at L4-L5 level 05/07/2023   Acute pulmonary embolus (HCC) 04/19/2015   Rash and nonspecific skin eruption 04/19/2015   Diabetes mellitus, type II (HCC) 03/30/2015   Elevated LFTs 03/30/2015   Abnormal liver function tests 03/30/2015   Incisional hernia, without obstruction or gangrene 03/21/2015   Recurrent SBO (small bowel obstruction) 03/15/2015   HYPERLIPIDEMIA 03/09/2008   Iron deficiency anemia 03/09/2008   GERD 03/09/2008   Bilateral inguinal hernia (BIH), Left > Right 03/09/2008    REFERRING PROVIDER: Barnett Abu, MD  REFERRING DIAG: M54.16 (ICD-10-CM) - Radiculopathy, lumbar region  Rationale for Evaluation and Treatment: Rehabilitation  THERAPY DIAG:  Other low back pain  Muscle weakness (generalized)  ONSET DATE:  DOS 05/07/2023   SUBJECTIVE:  SUBJECTIVE STATEMENT: Pt is 17 weeks and 1 day s/p L4-S1 fusion.  Pt denies any adverse effects after prior Rx.  Pt states he has been performing his HEP, though not regularly.  Pt reports improved core strength.  Pt states she does have some soreness in lumbar and post proximal LE's at HS origin.  PERTINENT HISTORY:  L4-S1 fusion on 05/07/2023.  Pt has B/L/T restrictions.   Pt has had 2 prior lumbar surgeries.  Hx of 5 cervical surgeries including cervical fusion C2-T1.  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 Location:  lumbar.    Pt states his R shoulder is bothering him too.  He's had shoulder pain for awhile and is seeing MD  soon.   PRECAUTIONS: Back and Other: Lumbar fusion, B/L/T restrictions, peripheral neuropathy, hx of cervical fusion, CVA    WEIGHT BEARING RESTRICTIONS: No  FALLS:  Has patient fallen in last 6 months? Yes. Number of falls 1  LIVING ENVIRONMENT: Lives with: lives with their family Lives in: 2 story home, but stays on 1st floor mainly Stairs: yes  OCCUPATION:  Pt is retired  PLOF: Independent  PATIENT GOALS: improve mobility, balance, strength, and tolerance with household chores.  To be able to perform woodworking.    OBJECTIVE:   DIAGNOSTIC FINDINGS:  Pt is post op.      TODAY'S TREATMENT:                                                                                                                                 Therapeutic Exercise:  Pt performed:   Supine alt LE ext with TrA 2x10   Supine SLR with TrA 2 x 10 reps bilat   S/L clams 2x10 bilat with RTB with TrA   supine HS stretch with strap 2x20 sec bilat    LAQ 2x10 with RTB   Supine manual HS stretch with passive AP's for nn flossing 2 sets of approx 20 AP's      Neuro re-ed Activities:   Marching on airex 2x10 with UE support   Tandem stance 2x20 sec bilat with UE support   Tandem gait x 2 laps at rail with SBA and with UE support on rail         PATIENT EDUCATION:  Education details: dx, exercise form, relevant anatomy, HEP, POC, and rationale of interventions.  Cont with HEP as instructed. Person educated: Patient Education method: Explanation, Demonstration, Tactile cues, Verbal cues Education comprehension: verbalized understanding, returned demonstration, verbal cues required, tactile cues required, and needs further education  HOME EXERCISE PROGRAM: Access Code: [email or web address] URL: [email or web address] Date: 07/31/2023 Prepared by: Aaron Edelman  Exercises - Supine Transversus Abdominis Bracing - Hands on Stomach  -  2 x daily - 7 x weekly - 2 sets - 10 reps - Log Roll  - 1 x daily  - 1 x weekly - 1 sets - 1 reps - Supine March  - 1 x daily - 7 x weekly - 2 sets - 10 reps - Hooklying Clamshell with Resistance  - 1 x daily - 4-5 x weekly - 2 sets - 10 reps - Seated Long Arc Quad  - 1 x daily - 7 x weekly - 2 sets - 10 reps  UPDATED HEP: - Knee extension with Ankle Pumps  - 1 x daily - 7 x weekly - 2 sets - Clamshell  - 1 x daily - 5 x weekly - 2 sets - 10 reps   ASSESSMENT:  CLINICAL IMPRESSION: PT is progressing exercises per protocol and pt is tolerating progression well.  He performed exercises per protocol well with cuing and instruction in correct form.  Pt required cues to stand up straight with airex marches.  Pt states he could feel it in his lumbar toward the end of airex marches.  He requires UE support on rail to perform tandem gait.  He responded well to Rx reporting no change in pain though was fatigued after Rx.  Pt should benefit from continued skilled PT services per protocol to address goals and impairments and to improve overall function.    OBJECTIVE IMPAIRMENTS: decreased activity tolerance, decreased balance, decreased endurance, decreased mobility, difficulty walking, decreased ROM, decreased strength, and pain.   ACTIVITY LIMITATIONS: carrying, lifting, bending, standing, squatting, stairs, transfers, and locomotion level  PARTICIPATION LIMITATIONS: cleaning, community activity, and church  PERSONAL FACTORS: 3+ comorbidities: prior back surgeries, cervical fusion, CVA, DM Type, peripheral neuropathy  are also affecting patient's functional outcome.   REHAB POTENTIAL: Good  CLINICAL DECISION MAKING: Stable/uncomplicated  EVALUATION COMPLEXITY: Low   GOALS:   SHORT TERM GOALS: Target date: 08/28/2023   Pt will be independent and compliant with HEP for improved pain, strength, and function. Baseline: Goal status: PROGRESSING  2.  Pt will progress with core exercises per protocol without adverse effects for improved strength and  performance of functional mobility. Baseline:  Goal status: GOAL MET  3.  Pt will demo correct form and be independent with log rolling. Baseline:  Goal status: GOAL MET Target date:  08/14/2023  4.  Pt will demo increased gait speed with good stability.   Baseline:  Goal status: INITIAL Target date:  09/04/2023    LONG TERM GOALS: Target date: 09/25/2023  Pt will report he is able to perform transfers without increased pain.  Baseline:  Goal status: INITIAL  2.  Pt will report at least a 70% improvement in pain, sx's, and mobility overall  Baseline:  Goal status: INITIAL  3.  Pt will  report improved tolerance with walking/duties at church on Sunday morning and be able to perform those duties without significant difficulty. Baseline:  Goal status: INITIAL  4.  Pt will demo improved core strength as evidenced by performance/progression of core exercises and improved bilat LE strength to 5/5 in hip flexion and knee flexion/extension for improved performance of and tolerance with functional mobility skills.  Baseline:  Goal status: INITIAL  5.  Pt will demo good control on stairs and report increased ease with performing stairs. Baseline:  Goal status: INITIAL   PLAN:  PT FREQUENCY: 2x/week  PT DURATION: 8 weeks  PLANNED INTERVENTIONS: Therapeutic exercises, Therapeutic activity, Neuromuscular re-education, Balance training, Gait training, Patient/Family education, Self Care,  Joint mobilization, Stair training, Aquatic Therapy, Dry Needling, Electrical stimulation, Cryotherapy, Moist heat, scar mobilization, Taping, Manual therapy, and Re-evaluation.  PLAN FOR NEXT SESSION: Cont per lumbar fusion protocol.  Core strengthening per lumbar fusion protocol.  PN next visit.   Audie Clear III PT, DPT 09/12/23 4:45 PM

## 2023-09-12 ENCOUNTER — Encounter (HOSPITAL_BASED_OUTPATIENT_CLINIC_OR_DEPARTMENT_OTHER): Payer: Self-pay | Admitting: Physical Therapy

## 2023-09-13 ENCOUNTER — Encounter (HOSPITAL_BASED_OUTPATIENT_CLINIC_OR_DEPARTMENT_OTHER): Payer: Medicare Other | Admitting: Physical Therapy

## 2023-09-14 ENCOUNTER — Ambulatory Visit (HOSPITAL_BASED_OUTPATIENT_CLINIC_OR_DEPARTMENT_OTHER): Payer: Medicare Other | Admitting: Physical Therapy

## 2023-09-14 DIAGNOSIS — M5459 Other low back pain: Secondary | ICD-10-CM | POA: Diagnosis not present

## 2023-09-14 DIAGNOSIS — M6281 Muscle weakness (generalized): Secondary | ICD-10-CM

## 2023-09-14 NOTE — Therapy (Signed)
OUTPATIENT PHYSICAL THERAPY TREATMENT / PROGRESS NOTE  Progress Note Reporting Period 07/31/2023 to 09/14/2023  See note below for Objective Data and Assessment of Progress/Goals.       Patient Name: James Barber MRN: [id number] DOB:[birth year], 77 y.o., male Today's Date: 09/15/2023  END OF SESSION:  PT End of Session - 09/14/23 1158     Visit Number 10    Number of Visits 18    Date for PT Re-Evaluation 10/12/23    Authorization Type BCBS MCR    PT Start Time 1150    PT Stop Time 1238    PT Time Calculation (min) 48 min    Activity Tolerance Patient tolerated treatment well;No increased pain    Behavior During Therapy WFL for tasks assessed/performed                   Past Medical History:  Diagnosis Date   Bowel obstruction (HCC)    Cancer (HCC)    prostate- "everything removed and clear"   Complication of anesthesia    Hard to wake and spikes fever   Diabetes mellitus without complication (HCC)    GERD (gastroesophageal reflux disease)    pt has not had issues with this in "a long time"   History of pulmonary embolus (PE)    Hypertension    Hypothyroidism    Ischemic stroke (HCC) 12/2022   Sleep apnea    Past Surgical History:  Procedure Laterality Date   ABDOMINAL SURGERY     "had ileostomy for about 4 months and had it reversed- due to salmonella infection- this happened in [place]"   ANTERIOR CERVICAL DECOMP/DISCECTOMY FUSION  11/12/2012   Procedure: ANTERIOR CERVICAL DECOMPRESSION/DISCECTOMY FUSION 2 LEVELS;  Surgeon: Barnett Abu, MD;  Location: [facility];  Service: Neurosurgery;  Laterality: N/A;  Right Side approach Cervical six-seven,Cervical seven -thoracic one Anterior cervical decompression/diskectomy/fusion   BACK SURGERY  2023   x2 (outpatient with Dr. Danielle Dess)   CARPAL TUNNEL RELEASE Bilateral    CERVICAL FUSION     c 3-4, C 4-5, C5-6   HERNIA REPAIR     right inguinal   LAPAROTOMY N/A 03/22/2015   Procedure: DIAGONOSTIC  LAPAROSCOPY, LAPROSCOPIC LYSIS OF ADHESIONS FOR ONE HOUR, DISTAL SMALL BOWEL RESECTION;  Surgeon: [name], MD;  Location: [facility];  Service: General;  Laterality: N/A;   Patient Active Problem List   Diagnosis Date Noted   Spondylolisthesis at L4-L5 level 05/07/2023   Acute pulmonary embolus (HCC) 04/19/2015   Rash and nonspecific skin eruption 04/19/2015   Diabetes mellitus, type II (HCC) 03/30/2015   Elevated LFTs 03/30/2015   Abnormal liver function tests 03/30/2015   Incisional hernia, without obstruction or gangrene 03/21/2015   Recurrent SBO (small bowel obstruction) 03/15/2015   HYPERLIPIDEMIA 03/09/2008   Iron deficiency anemia 03/09/2008   GERD 03/09/2008   Bilateral inguinal hernia (BIH), Left > Right 03/09/2008    REFERRING PROVIDER: Barnett Abu, MD  REFERRING DIAG: M54.16 (ICD-10-CM) - Radiculopathy, lumbar region  Rationale for Evaluation and Treatment: Rehabilitation  THERAPY DIAG:  Other low back pain  Muscle weakness (generalized)  ONSET DATE:  DOS 05/07/2023   SUBJECTIVE:  SUBJECTIVE STATEMENT: Pt is 17 weeks and 1 day s/p L4-S1 fusion.  Pt states his pain is not much "compared to what it used to be".  Pt states he is getting stronger.  His walking has improved though is not where he wants it to be.  Pt states his fatigue with his "Sunday School activities is about the same.  Pt reports no improvement in stairs though has improved with performing transfers.  Pt states he has not consistently performed his HEP.  Pt denies any adverse effects after prior Rx.    PERTINENT HISTORY:  L4-S1 fusion on 05/07/2023.  Pt has B/L/T restrictions.   Pt has had 2 prior lumbar surgeries.  Hx of 5 cervical surgeries including cervical fusion C2-T1.  DM type 2, peripheral neuropathy in bilat  feet  CVA 12/2022--R sided visual deficits, some recall/processing deficits   Hx of PE in 2016 and 2021 HTN  PAIN:  NPRS:  2-3/10 current at rest, 4/10 worst, 2-3/10 best Worst pain when standing up after sitting up. Location:  lumbar.    Pt states his R shoulder is bothering him too.  He's had shoulder pain for awhile and is seeing MD soon.   PRECAUTIONS: Back and Other: Lumbar fusion, B/L/T restrictions, peripheral neuropathy, hx of cervical fusion, CVA    WEIGHT BEARING RESTRICTIONS: No  FALLS:  Has patient fallen in last 6 months? Yes. Number of falls 1  LIVING ENVIRONMENT: Lives with: lives with their family Lives in: 2 story home, but stays on 1st floor mainly Stairs: yes  OCCUPATION:  Pt is retired  PLOF: Independent  PATIENT GOALS: improve mobility, balance, strength, and tolerance with household chores.  To be able to perform woodworking.    OBJECTIVE:   DIAGNOSTIC FINDINGS:  Pt is post op.      TODAY'S TREATMENT:                                                                                                                                 LOWER EXTREMITY MMT:     MMT Right eval Left eval Right 10/11 Left 10/11  Hip flexion     5/5 4/5  Hip extension        Hip abduction     23" .8 22.3  Hip adduction        Hip internal rotation        Hip external rotation        Knee flexion 4/5 seated 4/5 seated 5/5 seated 4+/5 seated  Knee extension 4/5 4+/5 5/5 5/5  Ankle dorsiflexion 5/5 5/5    Ankle plantarflexion WFL seated WFL seated    Ankle inversion        Ankle eversion         (Blank rows = not tested)  Gait: very slow gait, increased R foot ER, limited arm swing, decreased pelvic rotation.  Pt did not use the wall for assistance.  5x STS test:  16.45 sec  FOTO:  Initial/Current:  50 / 56.  Goal of 55.  Therapeutic Exercise:  Pt performed:   Nustep at L2-3 x mins, just LE's   Supine alt LE ext with TrA 2x10   Supine SLR with TrA 2 x 10  reps L LE, 1x5 R LE   Supine manual HS stretch with passive AP's for nn flossing 2 sets of approx 20 AP's      Neuro re-ed Activities:   Marching on airex 2x10 with UE support   Tandem gait x 2 laps at rail with SBA and with UE support on rail         PATIENT EDUCATION:  Education details: dx, exercise form, relevant anatomy, objective findings, goal progress, HEP, POC, and rationale of interventions.  Cont with HEP as instructed. Person educated: Patient Education method: Explanation, Demonstration, Tactile cues, Verbal cues Education comprehension: verbalized understanding, returned demonstration, verbal cues required, tactile cues required, and needs further education  HOME EXERCISE PROGRAM: Access Code: [email or web address] URL: [email or web address] Date: 07/31/2023 Prepared by: Aaron Edelman  Exercises - Supine Transversus Abdominis Bracing - Hands on Stomach  - 2 x daily - 7 x weekly - 2 sets - 10 reps - Log Roll  - 1 x daily - 1 x weekly - 1 sets - 1 reps - Supine March  - 1 x daily - 7 x weekly - 2 sets - 10 reps - Hooklying Clamshell with Resistance  - 1 x daily - 4-5 x weekly - 2 sets - 10 reps - Seated Long Arc Quad  - 1 x daily - 7 x weekly - 2 sets - 10 reps - Knee extension with Ankle Pumps  - 1 x daily - 7 x weekly - 2 sets - Clamshell  - 1 x daily - 5 x weekly - 2 sets - 10 reps   ASSESSMENT:  CLINICAL IMPRESSION: PT is progressing exercises per protocol and pt is tolerating progression well.  He performed exercises per protocol well with cuing and instruction in correct form.  Pt required cues to stand up straight with airex marches.  Pt states he could feel it in his lumbar toward the end of airex marches.  He requires UE support on rail to perform tandem gait.  He responded well to Rx reporting no change in pain though was fatigued after Rx.  Pt should benefit from continued skilled PT services per protocol to address goals and impairments and to improve  overall function.  Pt is improving with core and LE strength, pain, and functional mobility.  He reports improved ambulation though continues to become fatigued with increased ambulation distance such as Sunday [employer] activities.  Pt reports no change in tolerance with Sunday School activities.  He continues to be very slow with gait.  He reports improved performance of transfers though no improvement with stairs.  Pt's worst pain has decreased.  He demonstrates improved bilat knee strength as evidenced by MMT testing.  Pt improved performance on 5x STS test by 1.3 seconds.  Pt demonstrates improved self perceived disability with FOTO score improving from 50 to 56.  She met her FOTO goal.  Pt has met STG's #2,3 and partially met STG #1 and LTG#4.  Pt should continue to benefit from cont skilled PT services per protocol to address ongoing goals and to assist in restoring desired level of function.   OBJECTIVE IMPAIRMENTS: decreased activity tolerance, decreased balance, decreased endurance, decreased mobility, difficulty walking, decreased  ROM, decreased strength, and pain.   ACTIVITY LIMITATIONS: carrying, lifting, bending, standing, squatting, stairs, transfers, and locomotion level  PARTICIPATION LIMITATIONS: cleaning, community activity, and church  PERSONAL FACTORS: 3+ comorbidities: prior back surgeries, cervical fusion, CVA, DM Type, peripheral neuropathy  are also affecting patient's functional outcome.   REHAB POTENTIAL: Good  CLINICAL DECISION MAKING: Stable/uncomplicated  EVALUATION COMPLEXITY: Low   GOALS:   SHORT TERM GOALS: Target date: 08/28/2023   Pt will be independent and compliant with HEP for improved pain, strength, and function. Baseline: Goal status: PARTIALLY MET  2.  Pt will progress with core exercises per protocol without adverse effects for improved strength and performance of functional mobility. Baseline:  Goal status: GOAL MET  3.  Pt will demo correct  form and be independent with log rolling. Baseline:  Goal status: GOAL MET Target date:  08/14/2023  4.  Pt will demo increased gait speed with good stability.   Baseline:  Goal status:  ONGOING Target date:  09/04/2023    LONG TERM GOALS: Target date: 10/12/2023  Pt will report he is able to perform transfers without increased pain.  Baseline:  Goal status: ONGOING  2.  Pt will report at least a 70% improvement in pain, sx's, and mobility overall  Baseline:  Goal status: ONGOING  3.  Pt will report improved tolerance with walking/duties at church on Sunday morning and be able to perform those duties without significant difficulty. Baseline:  Goal status: ONGOING  4.  Pt will demo improved core strength as evidenced by performance/progression of core exercises and improved bilat LE strength to 5/5 in hip flexion and knee flexion/extension and a 5# increase in bilat hip abd for improved performance of and tolerance with functional mobility skills.  Baseline:  Goal status:  PARTIALLY MET  5.  Pt will demo good control on stairs and report increased ease with performing stairs. Baseline:  Goal status: ONGOING   PLAN:  PT FREQUENCY: 2x/week  PT DURATION: 4 weeks  PLANNED INTERVENTIONS: Therapeutic exercises, Therapeutic activity, Neuromuscular re-education, Balance training, Gait training, Patient/Family education, Self Care, Joint mobilization, Stair training, Aquatic Therapy, Dry Needling, Electrical stimulation, Cryotherapy, Moist heat, scar mobilization, Taping, Manual therapy, and Re-evaluation.  PLAN FOR NEXT SESSION: Cont per lumbar fusion protocol.  Core strengthening per lumbar fusion protocol.     Audie Clear III PT, DPT 09/15/23 11:53 AM

## 2023-09-15 ENCOUNTER — Encounter (HOSPITAL_BASED_OUTPATIENT_CLINIC_OR_DEPARTMENT_OTHER): Payer: Self-pay | Admitting: Physical Therapy

## 2023-09-18 ENCOUNTER — Ambulatory Visit (HOSPITAL_BASED_OUTPATIENT_CLINIC_OR_DEPARTMENT_OTHER): Payer: Medicare Other | Admitting: Physical Therapy

## 2023-09-18 DIAGNOSIS — M25611 Stiffness of right shoulder, not elsewhere classified: Secondary | ICD-10-CM

## 2023-09-18 DIAGNOSIS — M5459 Other low back pain: Secondary | ICD-10-CM | POA: Diagnosis not present

## 2023-09-18 DIAGNOSIS — G8929 Other chronic pain: Secondary | ICD-10-CM

## 2023-09-18 DIAGNOSIS — M6281 Muscle weakness (generalized): Secondary | ICD-10-CM

## 2023-09-18 NOTE — Therapy (Signed)
OUTPATIENT PHYSICAL THERAPY TREATMENT / PROGRESS NOTE  Progress Note Reporting Period 07/31/2023 to 09/14/2023  See note below for Objective Data and Assessment of Progress/Goals.       Patient Name: James Barber MRN: [id number] DOB:06/07/1946, 77 y.o., male Today's Date: 09/18/2023  END OF SESSION:          Past Medical History:  Diagnosis Date   Bowel obstruction (HCC)    Cancer (HCC)    prostate- "everything removed and clear"   Complication of anesthesia    Hard to wake and spikes fever   Diabetes mellitus without complication (HCC)    GERD (gastroesophageal reflux disease)    pt has not had issues with this in "a long time"   History of pulmonary embolus (PE)    Hypertension    Hypothyroidism    Ischemic stroke (HCC) 12/2022   Sleep apnea    Past Surgical History:  Procedure Laterality Date   ABDOMINAL SURGERY     "had ileostomy for about 4 months and had it reversed- due to salmonella infection- [place]   ANTERIOR CERVICAL DECOMP/DISCECTOMY FUSION  11/12/2012   Procedure: ANTERIOR CERVICAL DECOMPRESSION/DISCECTOMY FUSION 2 LEVELS;  Surgeon: Barnett Abu, MD;  Location: [facility];  Service: Neurosurgery;  Laterality: N/A;  Right Side approach Cervical six-seven,Cervical seven -thoracic one Anterior cervical decompression/diskectomy/fusion   BACK SURGERY  2023   x2 (outpatient with Dr. Danielle Dess)   CARPAL TUNNEL RELEASE Bilateral    CERVICAL FUSION     c 3-4, C 4-5, C5-6   HERNIA REPAIR     right inguinal   LAPAROTOMY N/A 03/22/2015   Procedure: DIAGONOSTIC LAPAROSCOPY, LAPROSCOPIC LYSIS OF ADHESIONS FOR ONE HOUR, DISTAL SMALL BOWEL RESECTION;  Surgeon: [name], MD;  Location: [facility];  Service: General;  Laterality: N/A;   Patient Active Problem List   Diagnosis Date Noted   Spondylolisthesis at L4-L5 level 05/07/2023   Acute pulmonary embolus (HCC) 04/19/2015   Rash and nonspecific skin eruption 04/19/2015   Diabetes  mellitus, type II (HCC) 03/30/2015   Elevated LFTs 03/30/2015   Abnormal liver function tests 03/30/2015   Incisional hernia, without obstruction or gangrene 03/21/2015   Recurrent SBO (small bowel obstruction) 03/15/2015   HYPERLIPIDEMIA 03/09/2008   Iron deficiency anemia 03/09/2008   GERD 03/09/2008   Bilateral inguinal hernia (BIH), Left > Right 03/09/2008    REFERRING PROVIDER: Barnett Abu, MD  REFERRING DIAG: M54.16 (ICD-10-CM) - Radiculopathy, lumbar region     M75.41  Impingement syndrome of right shoulder  Rationale for Evaluation and Treatment: Rehabilitation  THERAPY DIAG:  No diagnosis found.  ONSET DATE:  DOS 05/07/2023   SUBJECTIVE:  SUBJECTIVE STATEMENT: Pt is 17 weeks and 1 day s/p L4-S1 fusion.   Pt states he's hurting some today.  He did exercises yesterday and had some soreness and stiffness with the exercises.  Pt states his R shoulder began hurting 2-3 years ago with no specific MOI.  Pt saw Dr. Rennis Chris and had an injection on 9/30.  He had x rays.  He reports pain relief lasted only 1-2 days.   He has had no prior PT.  MD referral indicated R shoulder impingement.  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     Pt has pain and is limited with overhead reaching/activities.  Pt has increased pain with self care activities including bathing and dressing.  He avoids using his R UE with activity due to pain.   Pt states his pain is not much "compared to what it used to be".  Pt states he is getting stronger.  His walking has improved though is not where he wants it to be.  Pt states his fatigue with his Sunday School activities is about the same.  Pt reports no improvement in stairs though has improved with performing transfers.  Pt states he has  not consistently performed his HEP.  Pt denies any adverse effects after prior Rx.    PERTINENT HISTORY:  L4-S1 fusion on 05/07/2023.  Pt has B/L/T restrictions.   Pt has had 2 prior lumbar surgeries.  Hx of 5 cervical surgeries including cervical fusion C2-T1.  DM type 2, peripheral neuropathy in bilat feet  CVA 12/2022--R sided visual deficits, some recall/processing deficits   Hx of PE in 2016 and 2021 HTN  PAIN:  NPRS:  2/10 current, 8/10 worst, 2/10 Location:  R shoulder primarily anterior though feels deep  NPRS:  2-3/10 current at rest, 4/10 worst, 2-3/10 best Worst pain when standing up after sitting up. Location:  L sided lumbar.    Pt states his R shoulder is bothering him too.  He's had shoulder pain for awhile and is seeing MD soon.   PRECAUTIONS: Back and Other: Lumbar fusion, B/L/T restrictions, peripheral neuropathy, hx of cervical fusion, CVA    WEIGHT BEARING RESTRICTIONS: No  FALLS:  Has patient fallen in last 6 months? Yes. Number of falls 1  LIVING ENVIRONMENT: Lives with: lives with their family Lives in: 2 story home, but stays on 1st floor mainly Stairs: yes  OCCUPATION:  Pt is retired  PLOF: Independent  PATIENT GOALS: improve mobility, balance, strength, and tolerance with household chores.  To be able to perform woodworking.    OBJECTIVE:   DIAGNOSTIC FINDINGS:  Pt is post op.      TODAY'S TREATMENT:                                                                                                                                 LOWER EXTREMITY MMT:     MMT Right eval  Left eval Right 10/11 Left 10/11  Hip flexion     5/5 4/5  Hip extension        Hip abduction     23.8 22.3  Hip adduction        Hip internal rotation        Hip external rotation        Knee flexion 4/5 seated 4/5 seated 5/5 seated 4+/5 seated  Knee extension 4/5 4+/5 5/5 5/5  Ankle dorsiflexion 5/5 5/5    Ankle plantarflexion WFL seated WFL seated    Ankle  inversion        Ankle eversion         (Blank rows = not tested)  Gait: very slow gait, increased R foot ER, limited arm swing, decreased pelvic rotation.  Pt did not use the wall for assistance.    5x STS test:  16.45 sec  FOTO:  Initial/Current:  50 / 56.  Goal of 55.  Therapeutic Exercise:  Pt performed:   Nustep at L2-3 x mins, just LE's   Supine alt LE ext with TrA 2x10   Supine SLR with TrA 2 x 10 reps L LE, 1x5 R LE   Supine manual HS stretch with passive AP's for nn flossing 2 sets of approx 20 AP's      Neuro re-ed Activities:   Marching on airex 2x10 with UE support   Tandem gait x 2 laps at rail with SBA and with UE support on rail  AROM/PROM Right 10/15  Left 10/15  Shoulder flexion 121/133 with pain at end range 164  Shoulder scaption 130 with pain at end range 155  Shoulder abduction 81 with pain 154  Shoulder adduction    Shoulder internal rotation 54 53  Shoulder external rotation 58/62 74  Elbow flexion    Elbow extension    Wrist flexion    Wrist extension    Wrist ulnar deviation    Wrist radial deviation    Wrist pronation    Wrist supination    (Blank rows = not tested)  Shoulder Strength:  R / L Flex:  tolerated slight resistance with pain / L:  5/5 ER:  5/5   /  4+/5 IR:    4/5  /  5/5  Special Tests: Neer's Impingement test:  R:  positive though limited with ROM,  L: negative Hawkins Kennedy Impingement test:  R: positive, L:  negative    UEFI:  37/80  PT educated pt concerning dx and anatomy and biomechanics of GH jt and scapulothoracic.  Used an anatomic model   PATIENT EDUCATION:  Education details: dx, exercise form, relevant anatomy, objective findings, goal progress, HEP, POC, and rationale of interventions.  Cont with HEP as instructed. Person educated: Patient Education method: Explanation, Demonstration, Tactile cues, Verbal cues Education comprehension: verbalized understanding, returned demonstration, verbal cues required,  tactile cues required, and needs further education  HOME EXERCISE PROGRAM: Access Code: [email or web address] URL: [email or web address] Date: 07/31/2023 Prepared by: Aaron Edelman  Exercises - Supine Transversus Abdominis Bracing - Hands on Stomach  - 2 x daily - 7 x weekly - 2 sets - 10 reps - Log Roll  - 1 x daily - 1 x weekly - 1 sets - 1 reps - Supine March  - 1 x daily - 7 x weekly - 2 sets - 10 reps - Hooklying Clamshell with Resistance  - 1 x daily - 4-5 x weekly - 2 sets - 10 reps - Seated  Long Arc Quad  - 1 x daily - 7 x weekly - 2 sets - 10 reps - Knee extension with Ankle Pumps  - 1 x daily - 7 x weekly - 2 sets - Clamshell  - 1 x daily - 5 x weekly - 2 sets - 10 reps   ASSESSMENT:  CLINICAL IMPRESSION: PT is progressing exercises per protocol and pt is tolerating progression well.  He performed exercises per protocol well with cuing and instruction in correct form.  Pt required cues to stand up straight with airex marches.  Pt states he could feel it in his lumbar toward the end of airex marches.  He requires UE support on rail to perform tandem gait.  He responded well to Rx reporting no change in pain though was fatigued after Rx.  Pt should benefit from continued skilled PT services per protocol to address goals and impairments and to improve overall function.  Pt is improving with core and LE strength, pain, and functional mobility.  He reports improved ambulation though continues to become fatigued with increased ambulation distance such as Sunday School activities.  Pt reports no change in tolerance with Sunday School activities.  He continues to be very slow with gait.  He reports improved performance of transfers though no improvement with stairs.  Pt's worst pain has decreased.  He demonstrates improved bilat knee strength as evidenced by MMT testing.  Pt improved performance on 5x STS test by 1.3 seconds.  Pt demonstrates improved self perceived disability with FOTO  score improving from 50 to 56.  She met her FOTO goal.  Pt has met STG's #2,3 and partially met STG #1 and LTG#4.  Pt should continue to benefit from cont skilled PT services per protocol to address ongoing goals and to assist in restoring desired level of function.   OBJECTIVE IMPAIRMENTS: decreased activity tolerance, decreased balance, decreased endurance, decreased mobility, difficulty walking, decreased ROM, decreased strength, and pain.   ACTIVITY LIMITATIONS: carrying, lifting, bending, standing, squatting, stairs, transfers, and locomotion level  PARTICIPATION LIMITATIONS: cleaning, community activity, and church  PERSONAL FACTORS: 3+ comorbidities: prior back surgeries, cervical fusion, CVA, DM Type, peripheral neuropathy  are also affecting patient's functional outcome.   REHAB POTENTIAL: Good  CLINICAL DECISION MAKING: Stable/uncomplicated  EVALUATION COMPLEXITY: Low   GOALS:   SHORT TERM GOALS: Target date: 08/28/2023   Pt will be independent and compliant with HEP for improved pain, strength, and function. Baseline: Goal status: PARTIALLY MET  2.  Pt will progress with core exercises per protocol without adverse effects for improved strength and performance of functional mobility. Baseline:  Goal status: GOAL MET  3.  Pt will demo correct form and be independent with log rolling. Baseline:  Goal status: GOAL MET Target date:  08/14/2023  4.  Pt will demo increased gait speed with good stability.   Baseline:  Goal status:  ONGOING Target date:  09/04/2023    LONG TERM GOALS: Target date: 10/12/2023  Pt will report he is able to perform transfers without increased pain.  Baseline:  Goal status: ONGOING  2.  Pt will report at least a 70% improvement in pain, sx's, and mobility overall  Baseline:  Goal status: ONGOING  3.  Pt will report improved tolerance with walking/duties at church on Sunday morning and be able to perform those duties without significant  difficulty. Baseline:  Goal status: ONGOING  4.  Pt will demo improved core strength as evidenced by performance/progression of core exercises and  improved bilat LE strength to 5/5 in hip flexion and knee flexion/extension and a 5# increase in bilat hip abd for improved performance of and tolerance with functional mobility skills.  Baseline:  Goal status:  PARTIALLY MET  5.  Pt will demo good control on stairs and report increased ease with performing stairs. Baseline:  Goal status: ONGOING   PLAN:  PT FREQUENCY: 2x/week  PT DURATION: 4 weeks  PLANNED INTERVENTIONS: Therapeutic exercises, Therapeutic activity, Neuromuscular re-education, Balance training, Gait training, Patient/Family education, Self Care, Joint mobilization, Stair training, Aquatic Therapy, Dry Needling, Electrical stimulation, Cryotherapy, Moist heat, scar mobilization, Taping, Manual therapy, and Re-evaluation.  PLAN FOR NEXT SESSION: Cont per lumbar fusion protocol.  Core strengthening per lumbar fusion protocol.     Audie Clear III PT, DPT 09/18/23 10:33 AM

## 2023-09-19 ENCOUNTER — Encounter (HOSPITAL_BASED_OUTPATIENT_CLINIC_OR_DEPARTMENT_OTHER): Payer: Self-pay | Admitting: Physical Therapy

## 2023-09-20 ENCOUNTER — Ambulatory Visit (HOSPITAL_BASED_OUTPATIENT_CLINIC_OR_DEPARTMENT_OTHER): Payer: Medicare Other | Admitting: Physical Therapy

## 2023-09-20 DIAGNOSIS — M25611 Stiffness of right shoulder, not elsewhere classified: Secondary | ICD-10-CM

## 2023-09-20 DIAGNOSIS — M6281 Muscle weakness (generalized): Secondary | ICD-10-CM

## 2023-09-20 DIAGNOSIS — G8929 Other chronic pain: Secondary | ICD-10-CM

## 2023-09-20 DIAGNOSIS — M5459 Other low back pain: Secondary | ICD-10-CM | POA: Diagnosis not present

## 2023-09-20 NOTE — Therapy (Signed)
OUTPATIENT PHYSICAL THERAPY TREATMENT NOTE      Patient Name: James Barber MRN: [id number] DOB:May 01, 1946, 77 y.o., male Today's Date: 09/21/2023  END OF SESSION:  PT End of Session - 09/20/23 1151     Visit Number 12    Number of Visits 18    Date for PT Re-Evaluation 10/12/23    Authorization Type [employer] MCR    PT Start Time 1108    PT Stop Time 1145    PT Time Calculation (min) 37 min    Activity Tolerance Patient tolerated treatment well    Behavior During Therapy WFL for tasks assessed/performed                     Past Medical History:  Diagnosis Date   Bowel obstruction (HCC)    Cancer (HCC)    prostate- "everything removed and clear"   Complication of anesthesia    Hard to wake and spikes fever   Diabetes mellitus without complication (HCC)    GERD (gastroesophageal reflux disease)    pt has not had issues with this in "a long time"   History of pulmonary embolus (PE)    Hypertension    Hypothyroidism    Ischemic stroke (HCC) 12/2022   Sleep apnea    Past Surgical History:  Procedure Laterality Date   ABDOMINAL SURGERY     "had ileostomy for about 4 months and had it reversed- due to salmonella infection- this happened in [place]"   ANTERIOR CERVICAL DECOMP/DISCECTOMY FUSION  11/12/2012   Procedure: ANTERIOR CERVICAL DECOMPRESSION/DISCECTOMY FUSION 2 LEVELS;  Surgeon: Barnett Abu, MD;  Location: [facility];  Service: Neurosurgery;  Laterality: N/A;  Right Side approach Cervical six-seven,Cervical seven -thoracic one Anterior cervical decompression/diskectomy/fusion   BACK SURGERY  2023   x2 (outpatient with Dr. Danielle Dess)   CARPAL TUNNEL RELEASE Bilateral    CERVICAL FUSION     c 3-4, C 4-5, C5-6   HERNIA REPAIR     right inguinal   LAPAROTOMY N/A 03/22/2015   Procedure: DIAGONOSTIC LAPAROSCOPY, LAPROSCOPIC LYSIS OF ADHESIONS FOR ONE HOUR, DISTAL SMALL BOWEL RESECTION;  Surgeon: [name], MD;  Location: [facility];  Service: General;   Laterality: N/A;   Patient Active Problem List   Diagnosis Date Noted   Spondylolisthesis at L4-L5 level 05/07/2023   Acute pulmonary embolus (HCC) 04/19/2015   Rash and nonspecific skin eruption 04/19/2015   Diabetes mellitus, type II (HCC) 03/30/2015   Elevated LFTs 03/30/2015   Abnormal liver function tests 03/30/2015   Incisional hernia, without obstruction or gangrene 03/21/2015   Recurrent SBO (small bowel obstruction) 03/15/2015   HYPERLIPIDEMIA 03/09/2008   Iron deficiency anemia 03/09/2008   GERD 03/09/2008   Bilateral inguinal hernia (BIH), Left > Right 03/09/2008    REFERRING PROVIDER: Barnett Abu, MD REFERRING PROVIDER (shoulder):  Marlon Pel, MD    REFERRING DIAG: M54.16 (ICD-10-CM) - Radiculopathy, lumbar region     M75.41  Impingement syndrome of right shoulder  Rationale for Evaluation and Treatment: Rehabilitation  THERAPY DIAG:  Other low back pain  Muscle weakness (generalized)  Chronic right shoulder pain  Stiffness of right shoulder, not elsewhere classified  ONSET DATE:  DOS 05/07/2023   SUBJECTIVE:  SUBJECTIVE STATEMENT: Pt states he's hurting some today.  He did exercises yesterday and had some soreness and stiffness with the exercises.  Pt denies any adverse effects after prior Rx.  Pt has pain and is limited with overhead reaching/activities.  Pt has increased pain with self care activities including bathing and dressing.  He avoids using his R UE with activity due to pain.    PERTINENT HISTORY:  L4-S1 fusion on 05/07/2023.  Pt has B/L/T restrictions.   Pt has had 2 prior lumbar surgeries.  Hx of 5 cervical surgeries including cervical fusion C2-T1.  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   Location:  R shoulder primarily anterior though feels deep  NPRS:  2-3/10 current at rest, 4/10 worst, 2-3/10 best Worst pain when standing up after sitting up. Location:  L sided lumbar.       PRECAUTIONS: Back and Other: Lumbar fusion, B/L/T restrictions, peripheral neuropathy, hx of cervical fusion, CVA    WEIGHT BEARING RESTRICTIONS: No  FALLS:  Has patient fallen in last 6 months? Yes. Number of falls 1  LIVING ENVIRONMENT: Lives with: lives with their family Lives in: 2 story home, but stays on 1st floor mainly Stairs: yes  OCCUPATION:  Pt is retired  PLOF: Independent  PATIENT GOALS: improve mobility, balance, strength, and tolerance with household chores.  To be able to perform woodworking.    OBJECTIVE:   DIAGNOSTIC FINDINGS:  Pt is post op.      TODAY'S TREATMENT:                                                                                                                                 Therapeutic Exercise:               Pt performed:                        Nustep at L3 x 5 mins, Ue's/LE's                        Supine SLR with TrA 2 x 10 bilat   Standing rows with TrA with RTB 2x10   Standing shoulder extension with TrA with RTB 2x10                         Supine manual HS stretch with passive AP's for nn flossing 2 sets of approx 20 AP's   Mini squats with UE support on counter with TrA 2x10                       Tandem gait x 2 laps at rail with  SBA and with UE support on rail   PATIENT EDUCATION:  Education details: dx, exercise form, relevant anatomy, HEP, POC, and rationale of interventions.  Cont with HEP as instructed. Person educated: Patient Education method: Explanation, Demonstration, Tactile cues, Verbal cues Education comprehension: verbalized understanding, returned demonstration, verbal cues  required, tactile cues required, and needs further education  HOME EXERCISE PROGRAM: Access Code: [email or web address] URL: [email or web address] Date: 07/31/2023 Prepared by: Aaron Edelman  Exercises - Supine Transversus Abdominis Bracing - Hands on Stomach  - 2 x daily - 7 x weekly - 2 sets - 10 reps - Log Roll  - 1 x daily - 1 x weekly - 1 sets - 1 reps - Supine March  - 1 x daily - 7 x weekly - 2 sets - 10 reps - Hooklying Clamshell with Resistance  - 1 x daily - 4-5 x weekly - 2 sets - 10 reps - Seated Long Arc Quad  - 1 x daily - 7 x weekly - 2 sets - 10 reps - Knee extension with Ankle Pumps  - 1 x daily - 7 x weekly - 2 sets - Clamshell  - 1 x daily - 5 x weekly - 2 sets - 10 reps   ASSESSMENT:  CLINICAL IMPRESSION: Pt presents to Rx reporting he is having lumbar pain.  He had increased lumbar pain on one occasion of L supine SLR which increased to 5-6/10.  PT added rows and shoulder extensions with T band for improved core, postural, and scapular strength and for improved performance of reaching.  Pt performed exercises well with cuing and instruction in correct form and positioning.  Pt demonstrates improved form with squatting.  He responded well to Rx having no increased pain after Rx.  He should benefit from continued skilled PT services per protocol to address impairments and goals and improve pain and function.    OBJECTIVE IMPAIRMENTS: decreased activity tolerance, decreased balance, decreased endurance, decreased mobility, difficulty walking, decreased ROM, decreased strength, and pain.   ACTIVITY LIMITATIONS: carrying, lifting, bending, standing, squatting, stairs, transfers, and locomotion level  PARTICIPATION LIMITATIONS: cleaning, community activity, and church  PERSONAL FACTORS: 3+ comorbidities: prior back surgeries, cervical fusion, CVA, DM Type, peripheral neuropathy  are also affecting patient's functional outcome.   REHAB POTENTIAL: Good  CLINICAL  DECISION MAKING: Stable/uncomplicated  EVALUATION COMPLEXITY: Low   GOALS:   SHORT TERM GOALS: Target date: 08/28/2023   Pt will be independent and compliant with HEP for improved pain, strength, and function. Baseline: Goal status: PARTIALLY MET  2.  Pt will progress with core exercises per protocol without adverse effects for improved strength and performance of functional mobility. Baseline:  Goal status: GOAL MET  3.  Pt will demo correct form and be independent with log rolling. Baseline:  Goal status: GOAL MET Target date:  08/14/2023  4.  Pt will demo increased gait speed with good stability.   Baseline:  Goal status:  ONGOING Target date:  09/04/2023  5.  Pt will report increased usage of R UE with daily activities.  Goal status:  INITIAL  Target date:   10/02/2023     LONG TERM GOALS: Target date: 10/12/2023  Pt will report he is able to perform transfers without increased pain.  Baseline:  Goal status: ONGOING  2.  Pt will report at least a 70% improvement in pain, sx's, and mobility overall  Baseline:  Goal status: ONGOING  3.  Pt will report improved tolerance with walking/duties at church on  Sunday morning and be able to perform those duties without significant difficulty. Baseline:  Goal status: ONGOING  4.  Pt will demo improved core strength as evidenced by performance/progression of core exercises and improved bilat LE strength to 5/5 in hip flexion and knee flexion/extension and a 5# increase in bilat hip abd for improved performance of and tolerance with functional mobility skills.  Baseline:  Goal status:  PARTIALLY MET  5.  Pt will demo good control on stairs and report increased ease with performing stairs. Baseline:  Goal status: ONGOING  6.  Pt will be able to perform self care activities with R UE including bathing and dressing without significant pain and difficulty.   Goal status:  INITIAL   7.  Pt will report at least a 70%  improvement in performing reaching including reaching overhead.   Goal status:  INITIAL  8.  Pt will demo at least a 15-20 deg improvement in R shoulder flexion, scaption, and abd AROM for improved mobility and reaching.   Goal status: INITIAL   PLAN:  PT FREQUENCY: 2x/week  PT DURATION: 4 weeks  PLANNED INTERVENTIONS: Therapeutic exercises, Therapeutic activity, Neuromuscular re-education, Balance training, Gait training, Patient/Family education, Self Care, Joint mobilization, Stair training, Aquatic Therapy, Dry Needling, Electrical stimulation, Cryotherapy, Moist heat, scar mobilization, Taping, Manual therapy, and Re-evaluation.  PLAN FOR NEXT SESSION: Cont per lumbar fusion protocol.  Core strengthening per lumbar fusion protocol.  Cont with shoulder ROM and shoulder/scapular strengthening and stabilization.    Audie Clear III PT, DPT 09/21/23 11:58 AM

## 2023-09-21 ENCOUNTER — Encounter (HOSPITAL_BASED_OUTPATIENT_CLINIC_OR_DEPARTMENT_OTHER): Payer: Self-pay | Admitting: Physical Therapy

## 2023-09-25 ENCOUNTER — Ambulatory Visit (HOSPITAL_BASED_OUTPATIENT_CLINIC_OR_DEPARTMENT_OTHER): Payer: Medicare Other | Admitting: Physical Therapy

## 2023-09-25 DIAGNOSIS — G8929 Other chronic pain: Secondary | ICD-10-CM

## 2023-09-25 DIAGNOSIS — M5459 Other low back pain: Secondary | ICD-10-CM

## 2023-09-25 DIAGNOSIS — M25611 Stiffness of right shoulder, not elsewhere classified: Secondary | ICD-10-CM

## 2023-09-25 DIAGNOSIS — M6281 Muscle weakness (generalized): Secondary | ICD-10-CM

## 2023-09-25 NOTE — Therapy (Signed)
OUTPATIENT PHYSICAL THERAPY TREATMENT NOTE      Patient Name: James Barber MRN: [id number] DOB:22-Nov-1946, 77 y.o., male Today's Date: 09/26/2023  END OF SESSION:  PT End of Session - 09/25/23 1039     Visit Number 13    Number of Visits 18    Date for PT Re-Evaluation 10/12/23    Authorization Type BCBS MCR    PT Start Time 1025    PT Stop Time 1107    PT Time Calculation (min) 42 min    Activity Tolerance Patient tolerated treatment well    Behavior During Therapy WFL for tasks assessed/performed                      Past Medical History:  Diagnosis Date   Bowel obstruction (HCC)    Cancer (HCC)    prostate- "everything removed and clear"   Complication of anesthesia    Hard to wake and spikes fever   Diabetes mellitus without complication (HCC)    GERD (gastroesophageal reflux disease)    pt has not had issues with this in "a long time"   History of pulmonary embolus (PE)    Hypertension    Hypothyroidism    Ischemic stroke (HCC) 12/2022   Sleep apnea    Past Surgical History:  Procedure Laterality Date   ABDOMINAL SURGERY     "had ileostomy for about 4 months and had it reversed- due to salmonella infection- [place]   ANTERIOR CERVICAL DECOMP/DISCECTOMY FUSION  11/12/2012   Procedure: ANTERIOR CERVICAL DECOMPRESSION/DISCECTOMY FUSION 2 LEVELS;  Surgeon: Barnett Abu, MD;  Location: [facility];  Service: Neurosurgery;  Laterality: N/A;  Right Side approach Cervical six-seven,Cervical seven -thoracic one Anterior cervical decompression/diskectomy/fusion   BACK SURGERY  2023   x2 (outpatient with Dr. Danielle Dess)   CARPAL TUNNEL RELEASE Bilateral    CERVICAL FUSION     c 3-4, C 4-5, C5-6   HERNIA REPAIR     right inguinal   LAPAROTOMY N/A 03/22/2015   Procedure: DIAGONOSTIC LAPAROSCOPY, LAPROSCOPIC LYSIS OF ADHESIONS FOR ONE HOUR, DISTAL SMALL BOWEL RESECTION;  Surgeon: [name], MD;  Location: [facility];  Service: General;   Laterality: N/A;   Patient Active Problem List   Diagnosis Date Noted   Spondylolisthesis at L4-L5 level 05/07/2023   Acute pulmonary embolus (HCC) 04/19/2015   Rash and nonspecific skin eruption 04/19/2015   Diabetes mellitus, type II (HCC) 03/30/2015   Elevated LFTs 03/30/2015   Abnormal liver function tests 03/30/2015   Incisional hernia, without obstruction or gangrene 03/21/2015   Recurrent SBO (small bowel obstruction) 03/15/2015   HYPERLIPIDEMIA 03/09/2008   Iron deficiency anemia 03/09/2008   GERD 03/09/2008   Bilateral inguinal hernia (BIH), Left > Right 03/09/2008    REFERRING PROVIDER: Barnett Abu, MD REFERRING PROVIDER (shoulder):  Marlon Pel, MD    REFERRING DIAG: M54.16 (ICD-10-CM) - Radiculopathy, lumbar region     M75.41  Impingement syndrome of right shoulder  Rationale for Evaluation and Treatment: Rehabilitation  THERAPY DIAG:  Other low back pain  Muscle weakness (generalized)  Chronic right shoulder pain  Stiffness of right shoulder, not elsewhere classified  ONSET DATE:  DOS 05/07/2023   SUBJECTIVE:  SUBJECTIVE STATEMENT: "As long as I don't use it (my shoulder), I'm fine".  Pt denies shoulder pain at rest, though has pain with movement.  Pt denies any adverse effects after prior Rx. He avoids using his R UE with activity due to pain.  He continues to have limited tolerance and fatigue with Sunday [employer] activities though states it is better compared to 1 month ago.     PERTINENT HISTORY:  L4-S1 fusion on 05/07/2023.  Pt has B/L/T restrictions.   Pt has had 2 prior lumbar surgeries.  Hx of 5 cervical surgeries including cervical fusion C2-T1.  DM type 2, peripheral neuropathy in bilat feet  CVA 12/2022--R sided visual deficits, some recall/processing deficits    Hx of PE in 2016 and 2021 HTN  PAIN:  NPRS:  Pt states he is not noticing any shoulder pain currently.   Location:  R shoulder primarily anterior though feels deep  NPRS:  3/10 current at rest, 4/10 worst, 2-3/10 best Worst pain when standing up after sitting up. Location:  L sided lumbar.       PRECAUTIONS: Back and Other: Lumbar fusion, B/L/T restrictions, peripheral neuropathy, hx of cervical fusion, CVA    WEIGHT BEARING RESTRICTIONS: No  FALLS:  Has patient fallen in last 6 months? Yes. Number of falls 1  LIVING ENVIRONMENT: Lives with: lives with their family Lives in: 2 story home, but stays on 1st floor mainly Stairs: yes  OCCUPATION:  Pt is retired  PLOF: Independent  PATIENT GOALS: improve mobility, balance, strength, and tolerance with household chores.  To be able to perform woodworking.    OBJECTIVE:   DIAGNOSTIC FINDINGS:  Pt is post op.      TODAY'S TREATMENT:                                                                                                                                 Therapeutic Exercise:               Pt performed:                        Nustep at L3 x 5 mins, Ue's/LE's                        Supine SLR with TrA 2 x 10 bilat   Supine alt UE/LE 2x10   Supine HS stretch with strap 2x20-30 sec   Standing rows with TrA with RTB and GTB x10-12 each   Standing shoulder extension with TrA with RTB x15, GTB x10   Mini squats with UE support on counter with TrA 2x12   Supine wand flexion 2x10                         Supine manual HS stretch with passive AP's for nn flossing 2 sets of approx 20 AP's  PATIENT EDUCATION:  Education details: dx, exercise form, relevant anatomy, HEP, POC, and rationale of interventions.  Cont with HEP as instructed. Person educated: Patient Education method: Explanation, Demonstration, Tactile cues, Verbal cues Education comprehension: verbalized understanding, returned demonstration, verbal  cues required, tactile cues required, and needs further education  HOME EXERCISE PROGRAM: Access Code: [email or web address] URL: [email or web address] Date: 07/31/2023 Prepared by: Aaron Edelman  Exercises - Supine Transversus Abdominis Bracing - Hands on Stomach  - 2 x daily - 7 x weekly - 2 sets - 10 reps - Log Roll  - 1 x daily - 1 x weekly - 1 sets - 1 reps - Supine March  - 1 x daily - 7 x weekly - 2 sets - 10 reps - Hooklying Clamshell with Resistance  - 1 x daily - 4-5 x weekly - 2 sets - 10 reps - Seated Long Arc Quad  - 1 x daily - 7 x weekly - 2 sets - 10 reps - Knee extension with Ankle Pumps  - 1 x daily - 7 x weekly - 2 sets - Clamshell  - 1 x daily - 5 x weekly - 2 sets - 10 reps   ASSESSMENT:  CLINICAL IMPRESSION: Pt continues to have limited tolerance and fatigue with his activities for Sunday [employer] including the amount of walking.  He does however report he has improved compared to 1 month ago.  Pt performs the log roll with bed mobility and requires reminders occasionally.  Pt performed exercises well with cuing and instruction in correct form and positioning.  Pt is squatting with much better form.  Pt able to perform supine alt UE/LE without R shoulder c/o's.  He responded well to Rx having no increased pain after Rx.  He should benefit from continued skilled PT services per protocol to address impairments and goals and improve pain and function.    OBJECTIVE IMPAIRMENTS: decreased activity tolerance, decreased balance, decreased endurance, decreased mobility, difficulty walking, decreased ROM, decreased strength, and pain.   ACTIVITY LIMITATIONS: carrying, lifting, bending, standing, squatting, stairs, transfers, and locomotion level  PARTICIPATION LIMITATIONS: cleaning, community activity, and church  PERSONAL FACTORS: 3+ comorbidities: prior back surgeries, cervical fusion, CVA, DM Type, peripheral neuropathy  are also affecting patient's functional outcome.    REHAB POTENTIAL: Good  CLINICAL DECISION MAKING: Stable/uncomplicated  EVALUATION COMPLEXITY: Low   GOALS:   SHORT TERM GOALS: Target date: 08/28/2023   Pt will be independent and compliant with HEP for improved pain, strength, and function. Baseline: Goal status: PARTIALLY MET  2.  Pt will progress with core exercises per protocol without adverse effects for improved strength and performance of functional mobility. Baseline:  Goal status: GOAL MET  3.  Pt will demo correct form and be independent with log rolling. Baseline:  Goal status: GOAL MET Target date:  08/14/2023  4.  Pt will demo increased gait speed with good stability.   Baseline:  Goal status:  ONGOING Target date:  09/04/2023  5.  Pt will report increased usage of R UE with daily activities.  Goal status:  INITIAL  Target date:   10/02/2023     LONG TERM GOALS: Target date: 10/12/2023  Pt will report he is able to perform transfers without increased pain.  Baseline:  Goal status: ONGOING  2.  Pt will report at least a 70% improvement in pain, sx's, and mobility overall  Baseline:  Goal status: ONGOING  3.  Pt will report improved tolerance with walking/duties at church on Sunday  morning and be able to perform those duties without significant difficulty. Baseline:  Goal status: ONGOING  4.  Pt will demo improved core strength as evidenced by performance/progression of core exercises and improved bilat LE strength to 5/5 in hip flexion and knee flexion/extension and a 5# increase in bilat hip abd for improved performance of and tolerance with functional mobility skills.  Baseline:  Goal status:  PARTIALLY MET  5.  Pt will demo good control on stairs and report increased ease with performing stairs. Baseline:  Goal status: ONGOING  6.  Pt will be able to perform self care activities with R UE including bathing and dressing without significant pain and difficulty.   Goal status:  INITIAL   7.   Pt will report at least a 70% improvement in performing reaching including reaching overhead.   Goal status:  INITIAL  8.  Pt will demo at least a 15-20 deg improvement in R shoulder flexion, scaption, and abd AROM for improved mobility and reaching.   Goal status: INITIAL   PLAN:  PT FREQUENCY: 2x/week  PT DURATION: 4 weeks  PLANNED INTERVENTIONS: Therapeutic exercises, Therapeutic activity, Neuromuscular re-education, Balance training, Gait training, Patient/Family education, Self Care, Joint mobilization, Stair training, Aquatic Therapy, Dry Needling, Electrical stimulation, Cryotherapy, Moist heat, scar mobilization, Taping, Manual therapy, and Re-evaluation.  PLAN FOR NEXT SESSION: Cont per lumbar fusion protocol.  Core strengthening per lumbar fusion protocol.  Cont with shoulder ROM and shoulder/scapular strengthening and stabilization.   Audie Clear III PT, DPT 09/26/23 5:19 PM

## 2023-09-26 ENCOUNTER — Encounter (HOSPITAL_BASED_OUTPATIENT_CLINIC_OR_DEPARTMENT_OTHER): Payer: Self-pay | Admitting: Physical Therapy

## 2023-09-27 ENCOUNTER — Ambulatory Visit (HOSPITAL_BASED_OUTPATIENT_CLINIC_OR_DEPARTMENT_OTHER): Payer: Medicare Other | Admitting: Physical Therapy

## 2023-09-27 DIAGNOSIS — G8929 Other chronic pain: Secondary | ICD-10-CM

## 2023-09-27 DIAGNOSIS — M25611 Stiffness of right shoulder, not elsewhere classified: Secondary | ICD-10-CM

## 2023-09-27 DIAGNOSIS — M6281 Muscle weakness (generalized): Secondary | ICD-10-CM

## 2023-09-27 DIAGNOSIS — M5459 Other low back pain: Secondary | ICD-10-CM

## 2023-09-27 NOTE — Therapy (Signed)
OUTPATIENT PHYSICAL THERAPY TREATMENT NOTE      Patient Name: James Barber MRN: [id number] DOB:20-Jul-1946, 77 y.o., male Today's Date: 09/28/2023  END OF SESSION:  PT End of Session - 09/27/23 1106     Visit Number 14    Number of Visits 18    Date for PT Re-Evaluation 10/12/23    Authorization Type BCBS MCR    PT Start Time 1018    PT Stop Time 1103    PT Time Calculation (min) 45 min    Activity Tolerance Patient tolerated treatment well    Behavior During Therapy WFL for tasks assessed/performed                       Past Medical History:  Diagnosis Date   Bowel obstruction (HCC)    Cancer (HCC)    prostate- "everything removed and clear"   Complication of anesthesia    Hard to wake and spikes fever   Diabetes mellitus without complication (HCC)    GERD (gastroesophageal reflux disease)    pt has not had issues with this in "a long time"   History of pulmonary embolus (PE)    Hypertension    Hypothyroidism    Ischemic stroke (HCC) 12/2022   Sleep apnea    Past Surgical History:  Procedure Laterality Date   ABDOMINAL SURGERY     "had ileostomy for about 4 months and had it reversed- due to salmonella infection- this happened in [place]"   ANTERIOR CERVICAL DECOMP/DISCECTOMY FUSION  11/12/2012   Procedure: ANTERIOR CERVICAL DECOMPRESSION/DISCECTOMY FUSION 2 LEVELS;  Surgeon: Barnett Abu, MD;  Location: [facility];  Service: Neurosurgery;  Laterality: N/A;  Right Side approach Cervical six-seven,Cervical seven -thoracic one Anterior cervical decompression/diskectomy/fusion   BACK SURGERY  2023   x2 (outpatient with Dr. Danielle Dess)   CARPAL TUNNEL RELEASE Bilateral    CERVICAL FUSION     c 3-4, C 4-5, C5-6   HERNIA REPAIR     right inguinal   LAPAROTOMY N/A 03/22/2015   Procedure: DIAGONOSTIC LAPAROSCOPY, LAPROSCOPIC LYSIS OF ADHESIONS FOR ONE HOUR, DISTAL SMALL BOWEL RESECTION;  Surgeon: [name], MD;  Location: [facility];  Service: General;   Laterality: N/A;   Patient Active Problem List   Diagnosis Date Noted   Spondylolisthesis at L4-L5 level 05/07/2023   Acute pulmonary embolus (HCC) 04/19/2015   Rash and nonspecific skin eruption 04/19/2015   Diabetes mellitus, type II (HCC) 03/30/2015   Elevated LFTs 03/30/2015   Abnormal liver function tests 03/30/2015   Incisional hernia, without obstruction or gangrene 03/21/2015   Recurrent SBO (small bowel obstruction) 03/15/2015   HYPERLIPIDEMIA 03/09/2008   Iron deficiency anemia 03/09/2008   GERD 03/09/2008   Bilateral inguinal hernia (BIH), Left > Right 03/09/2008    REFERRING PROVIDER: Barnett Abu, MD REFERRING PROVIDER (shoulder):  Marlon Pel, MD    REFERRING DIAG: M54.16 (ICD-10-CM) - Radiculopathy, lumbar region     M75.41  Impingement syndrome of right shoulder  Rationale for Evaluation and Treatment: Rehabilitation  THERAPY DIAG:  Other low back pain  Muscle weakness (generalized)  Chronic right shoulder pain  Stiffness of right shoulder, not elsewhere classified  ONSET DATE:  DOS 05/07/2023   SUBJECTIVE:  SUBJECTIVE STATEMENT: "I can feel that I have more strength, balance, and endurance."   Pt states he is making progress.  "As long as I don't use it (my shoulder), I'm fine".  Pt denies shoulder pain at rest, though has pain with movement.  Pt denies any adverse effects after prior Rx.    PERTINENT HISTORY:  L4-S1 fusion on 05/07/2023.  Pt has B/L/T restrictions.   Pt has had 2 prior lumbar surgeries.  Hx of 5 cervical surgeries including cervical fusion C2-T1.  DM type 2, peripheral neuropathy in bilat feet  CVA 12/2022--R sided visual deficits, some recall/processing deficits   Hx of PE in 2016 and 2021 HTN  PAIN:  NPRS:  Pt states he is not noticing any  shoulder pain currently.   Location:  R shoulder primarily anterior though feels deep  NPRS:  3/10 current at rest, 4/10 worst, 2-3/10 best Worst pain when standing up after sitting up. Location:  L sided lumbar.       PRECAUTIONS: Back and Other: Lumbar fusion, B/L/T restrictions, peripheral neuropathy, hx of cervical fusion, CVA    WEIGHT BEARING RESTRICTIONS: No  FALLS:  Has patient fallen in last 6 months? Yes. Number of falls 1  LIVING ENVIRONMENT: Lives with: lives with their family Lives in: 2 story home, but stays on 1st floor mainly Stairs: yes  OCCUPATION:  Pt is retired  PLOF: Independent  PATIENT GOALS: improve mobility, balance, strength, and tolerance with household chores.  To be able to perform woodworking.    OBJECTIVE:   DIAGNOSTIC FINDINGS:  Pt is post op.      TODAY'S TREATMENT:                                                                                                                                 Therapeutic Exercise:               Pt performed:                        Nustep at L3 x 6 mins, Ue's/LE's   Mini squats with UE support on counter with TrA 2x12   Sidestepping with TrA x 2 laps with light UE support on rail   Step ups with TrA x10 each on 6 inch step with rail   Heel raises with Tra 2x10   Standing rows with TrA with GTB 2x10 each   Standing shoulder extension with TrA with GTB 2x10   Supine wand flexion 2x10                    Neuro Re-ed activities:   Tandem gait with rail and SBA x 3 laps   Standing on airex with NBOS 2x30 sec   Marching on airex with UE support on rail 2x10      PATIENT EDUCATION:  Education details: dx, exercise form, relevant anatomy, HEP, POC, and  rationale of interventions.  Cont with HEP as instructed. Person educated: Patient Education method: Explanation, Demonstration, Tactile cues, Verbal cues Education comprehension: verbalized understanding, returned demonstration, verbal cues required,  tactile cues required, and needs further education  HOME EXERCISE PROGRAM: Access Code: [email or web address] URL: [email or web address] Date: 07/31/2023 Prepared by: Aaron Edelman  Exercises - Supine Transversus Abdominis Bracing - Hands on Stomach  - 2 x daily - 7 x weekly - 2 sets - 10 reps - Log Roll  - 1 x daily - 1 x weekly - 1 sets - 1 reps - Supine March  - 1 x daily - 7 x weekly - 2 sets - 10 reps - Hooklying Clamshell with Resistance  - 1 x daily - 4-5 x weekly - 2 sets - 10 reps - Seated Long Arc Quad  - 1 x daily - 7 x weekly - 2 sets - 10 reps - Knee extension with Ankle Pumps  - 1 x daily - 7 x weekly - 2 sets - Clamshell  - 1 x daily - 5 x weekly - 2 sets - 10 reps   ASSESSMENT:  CLINICAL IMPRESSION: Pt is making progress as evidenced by subjective reports.  PT progressed exercises with incresing intensity by increasing Wb'ing exercises.  Pt performed exercises well with instruction in correct form though was fatigued.  Pt requires UE support for tandem gait and marching on airex.  He responded well to Rx having no increased pain after Rx.  He should benefit from continued skilled PT services per protocol to address impairments and goals and improve pain and function.     OBJECTIVE IMPAIRMENTS: decreased activity tolerance, decreased balance, decreased endurance, decreased mobility, difficulty walking, decreased ROM, decreased strength, and pain.   ACTIVITY LIMITATIONS: carrying, lifting, bending, standing, squatting, stairs, transfers, and locomotion level  PARTICIPATION LIMITATIONS: cleaning, community activity, and church  PERSONAL FACTORS: 3+ comorbidities: prior back surgeries, cervical fusion, CVA, DM Type, peripheral neuropathy  are also affecting patient's functional outcome.   REHAB POTENTIAL: Good  CLINICAL DECISION MAKING: Stable/uncomplicated  EVALUATION COMPLEXITY: Low   GOALS:   SHORT TERM GOALS: Target date: 08/28/2023   Pt will be  independent and compliant with HEP for improved pain, strength, and function. Baseline: Goal status: PARTIALLY MET  2.  Pt will progress with core exercises per protocol without adverse effects for improved strength and performance of functional mobility. Baseline:  Goal status: GOAL MET  3.  Pt will demo correct form and be independent with log rolling. Baseline:  Goal status: GOAL MET Target date:  08/14/2023  4.  Pt will demo increased gait speed with good stability.   Baseline:  Goal status:  ONGOING Target date:  09/04/2023  5.  Pt will report increased usage of R UE with daily activities.  Goal status:  INITIAL  Target date:   10/02/2023     LONG TERM GOALS: Target date: 10/12/2023  Pt will report he is able to perform transfers without increased pain.  Baseline:  Goal status: ONGOING  2.  Pt will report at least a 70% improvement in pain, sx's, and mobility overall  Baseline:  Goal status: ONGOING  3.  Pt will report improved tolerance with walking/duties at church on Sunday morning and be able to perform those duties without significant difficulty. Baseline:  Goal status: ONGOING  4.  Pt will demo improved core strength as evidenced by performance/progression of core exercises and improved bilat LE strength to 5/5 in hip flexion and knee flexion/extension and  a 5# increase in bilat hip abd for improved performance of and tolerance with functional mobility skills.  Baseline:  Goal status:  PARTIALLY MET  5.  Pt will demo good control on stairs and report increased ease with performing stairs. Baseline:  Goal status: ONGOING  6.  Pt will be able to perform self care activities with R UE including bathing and dressing without significant pain and difficulty.   Goal status:  INITIAL   7.  Pt will report at least a 70% improvement in performing reaching including reaching overhead.   Goal status:  INITIAL  8.  Pt will demo at least a 15-20 deg improvement in R  shoulder flexion, scaption, and abd AROM for improved mobility and reaching.   Goal status: INITIAL   PLAN:  PT FREQUENCY: 2x/week  PT DURATION: 4 weeks  PLANNED INTERVENTIONS: Therapeutic exercises, Therapeutic activity, Neuromuscular re-education, Balance training, Gait training, Patient/Family education, Self Care, Joint mobilization, Stair training, Aquatic Therapy, Dry Needling, Electrical stimulation, Cryotherapy, Moist heat, scar mobilization, Taping, Manual therapy, and Re-evaluation.  PLAN FOR NEXT SESSION: Cont per lumbar fusion protocol.  Core strengthening per lumbar fusion protocol.  Cont with shoulder ROM and shoulder/scapular strengthening and stabilization.   Audie Clear III PT, DPT 09/28/23 2:00 PM

## 2023-09-28 ENCOUNTER — Encounter (HOSPITAL_BASED_OUTPATIENT_CLINIC_OR_DEPARTMENT_OTHER): Payer: Self-pay | Admitting: Physical Therapy

## 2023-10-02 ENCOUNTER — Ambulatory Visit (HOSPITAL_BASED_OUTPATIENT_CLINIC_OR_DEPARTMENT_OTHER): Payer: Medicare Other | Admitting: Physical Therapy

## 2023-10-02 DIAGNOSIS — M25611 Stiffness of right shoulder, not elsewhere classified: Secondary | ICD-10-CM

## 2023-10-02 DIAGNOSIS — M6281 Muscle weakness (generalized): Secondary | ICD-10-CM

## 2023-10-02 DIAGNOSIS — G8929 Other chronic pain: Secondary | ICD-10-CM

## 2023-10-02 DIAGNOSIS — M5459 Other low back pain: Secondary | ICD-10-CM

## 2023-10-02 NOTE — Therapy (Signed)
OUTPATIENT PHYSICAL THERAPY TREATMENT NOTE      Patient Name: James Barber MRN: [id number] DOB:1946/11/04, 77 y.o., male Today's Date: 10/03/2023  END OF SESSION:  PT End of Session - 10/02/23 1037     Visit Number 15    Number of Visits 18    Date for PT Re-Evaluation 10/12/23    Authorization Type BCBS MCR    PT Start Time 1028    PT Stop Time 1108    PT Time Calculation (min) 40 min    Activity Tolerance Patient tolerated treatment well    Behavior During Therapy WFL for tasks assessed/performed                       Past Medical History:  Diagnosis Date   Bowel obstruction (HCC)    Cancer (HCC)    prostate- "everything removed and clear"   Complication of anesthesia    Hard to wake and spikes fever   Diabetes mellitus without complication (HCC)    GERD (gastroesophageal reflux disease)    pt has not had issues with this in "a long time"   History of pulmonary embolus (PE)    Hypertension    Hypothyroidism    Ischemic stroke (HCC) 12/2022   Sleep apnea    Past Surgical History:  Procedure Laterality Date   ABDOMINAL SURGERY     "had ileostomy for about 4 months and had it reversed- due to salmonella infection- [place]   ANTERIOR CERVICAL DECOMP/DISCECTOMY FUSION  11/12/2012   Procedure: ANTERIOR CERVICAL DECOMPRESSION/DISCECTOMY FUSION 2 LEVELS;  Surgeon: Barnett Abu, MD;  Location: [facility];  Service: Neurosurgery;  Laterality: N/A;  Right Side approach Cervical six-seven,Cervical seven -thoracic one Anterior cervical decompression/diskectomy/fusion   BACK SURGERY  2023   x2 (outpatient with Dr. Danielle Dess)   CARPAL TUNNEL RELEASE Bilateral    CERVICAL FUSION     c 3-4, C 4-5, C5-6   HERNIA REPAIR     right inguinal   LAPAROTOMY N/A 03/22/2015   Procedure: DIAGONOSTIC LAPAROSCOPY, LAPROSCOPIC LYSIS OF ADHESIONS FOR ONE HOUR, DISTAL SMALL BOWEL RESECTION;  Surgeon: [name], MD;  Location: [facility];  Service: General;   Laterality: N/A;   Patient Active Problem List   Diagnosis Date Noted   Spondylolisthesis at L4-L5 level 05/07/2023   Acute pulmonary embolus (HCC) 04/19/2015   Rash and nonspecific skin eruption 04/19/2015   Diabetes mellitus, type II (HCC) 03/30/2015   Elevated LFTs 03/30/2015   Abnormal liver function tests 03/30/2015   Incisional hernia, without obstruction or gangrene 03/21/2015   Recurrent SBO (small bowel obstruction) 03/15/2015   HYPERLIPIDEMIA 03/09/2008   Iron deficiency anemia 03/09/2008   GERD 03/09/2008   Bilateral inguinal hernia (BIH), Left > Right 03/09/2008    REFERRING PROVIDER: Barnett Abu, MD REFERRING PROVIDER (shoulder):  Marlon Pel, MD    REFERRING DIAG: M54.16 (ICD-10-CM) - Radiculopathy, lumbar region     M75.41  Impingement syndrome of right shoulder  Rationale for Evaluation and Treatment: Rehabilitation  THERAPY DIAG:  Other low back pain  Muscle weakness (generalized)  Chronic right shoulder pain  Stiffness of right shoulder, not elsewhere classified  ONSET DATE:  DOS 05/07/2023   SUBJECTIVE:  SUBJECTIVE STATEMENT: Pt states he is better, just has slow progress.  "We are on the right road."  He is not having the back pain he was having prior to surgery.  He states he is having the post surgical pain.  Pt denies any adverse effects after prior Rx.    PERTINENT HISTORY:  L4-S1 fusion on 05/07/2023.  Pt has B/L/T restrictions.   Pt has had 2 prior lumbar surgeries.  Hx of 5 cervical surgeries including cervical fusion C2-T1.  DM type 2, peripheral neuropathy in bilat feet  CVA 12/2022--R sided visual deficits, some recall/processing deficits   Hx of PE in 2016 and 2021 HTN  PAIN:  NPRS:  Pt states he is not noticing any shoulder pain currently.    Location:  R shoulder primarily anterior though feels deep  NPRS:  3/10 current at rest, 4/10 worst, 2-3/10 best Worst pain when standing up after sitting up. Location:  bilat sides of lumbar and central lumbar       PRECAUTIONS: Back and Other: Lumbar fusion, B/L/T restrictions, peripheral neuropathy, hx of cervical fusion, CVA    WEIGHT BEARING RESTRICTIONS: No  FALLS:  Has patient fallen in last 6 months? Yes. Number of falls 1  LIVING ENVIRONMENT: Lives with: lives with their family Lives in: 2 story home, but stays on 1st floor mainly Stairs: yes  OCCUPATION:  Pt is retired  PLOF: Independent  PATIENT GOALS: improve mobility, balance, strength, and tolerance with household chores.  To be able to perform woodworking.    OBJECTIVE:   DIAGNOSTIC FINDINGS:  Pt is post op.      TODAY'S TREATMENT:                                                                                                                                 Therapeutic Exercise:               Pt performed:                       Nustep at L4 x 5 mins, Ue's/LE's   Mini squats with UE support on counter with TrA 2x12   Sidestepping with TrA x 2 laps with light UE support on rail   Step ups with TrA x10 each on 6 inch step with rail   Heel raises with Tra 2x10   Standing rows with TrA with GTB 2x10 each   Standing shoulder extension with TrA with GTB 2x10   LAQ # 2x10 bilat   Seated bilat ER with retraction with RTB 3x10                    Neuro Re-ed activities:   Tandem gait with rail and SBA x 3 laps   Standing on airex with NBOS 2x30 sec   Standing on floor x20 sec and on airex x20 sec      PATIENT EDUCATION:  Education details: dx, exercise form, relevant anatomy, HEP, POC, and rationale of interventions.  Cont with HEP as instructed. Person educated: Patient Education method: Explanation, Demonstration, Tactile cues, Verbal cues Education comprehension: verbalized understanding, returned  demonstration, verbal cues required, tactile cues required, and needs further education  HOME EXERCISE PROGRAM: Access Code: [email or web address] URL: [email or web address] Date: 07/31/2023 Prepared by: Aaron Edelman  Exercises - Supine Transversus Abdominis Bracing - Hands on Stomach  - 2 x daily - 7 x weekly - 2 sets - 10 reps - Log Roll  - 1 x daily - 1 x weekly - 1 sets - 1 reps - Supine March  - 1 x daily - 7 x weekly - 2 sets - 10 reps - Hooklying Clamshell with Resistance  - 1 x daily - 4-5 x weekly - 2 sets - 10 reps - Seated Long Arc Quad  - 1 x daily - 7 x weekly - 2 sets - 10 reps - Knee extension with Ankle Pumps  - 1 x daily - 7 x weekly - 2 sets - Clamshell  - 1 x daily - 5 x weekly - 2 sets - 10 reps   ASSESSMENT:  CLINICAL IMPRESSION: Pt is making progress as evidenced by subjective reports.  Pt performed exercises well with cuing for correct form and gives good effort with all exercises.  Pt is performing increased closed chain activities and is fatigued with exercises.  Pt requires UE support for tandem gait.  Pt demonstrates good form with squats.  He responded well to Rx having no increased pain after Rx.  He should benefit from continued skilled PT services per protocol to address impairments and goals and improve pain and function.     OBJECTIVE IMPAIRMENTS: decreased activity tolerance, decreased balance, decreased endurance, decreased mobility, difficulty walking, decreased ROM, decreased strength, and pain.   ACTIVITY LIMITATIONS: carrying, lifting, bending, standing, squatting, stairs, transfers, and locomotion level  PARTICIPATION LIMITATIONS: cleaning, community activity, and church  PERSONAL FACTORS: 3+ comorbidities: prior back surgeries, cervical fusion, CVA, DM Type, peripheral neuropathy  are also affecting patient's functional outcome.   REHAB POTENTIAL: Good  CLINICAL DECISION MAKING: Stable/uncomplicated  EVALUATION COMPLEXITY:  Low   GOALS:   SHORT TERM GOALS: Target date: 08/28/2023   Pt will be independent and compliant with HEP for improved pain, strength, and function. Baseline: Goal status: PARTIALLY MET  2.  Pt will progress with core exercises per protocol without adverse effects for improved strength and performance of functional mobility. Baseline:  Goal status: GOAL MET  3.  Pt will demo correct form and be independent with log rolling. Baseline:  Goal status: GOAL MET Target date:  08/14/2023  4.  Pt will demo increased gait speed with good stability.   Baseline:  Goal status:  ONGOING Target date:  09/04/2023  5.  Pt will report increased usage of R UE with daily activities.  Goal status:  INITIAL  Target date:   10/02/2023     LONG TERM GOALS: Target date: 10/12/2023  Pt will report he is able to perform transfers without increased pain.  Baseline:  Goal status: ONGOING  2.  Pt will report at least a 70% improvement in pain, sx's, and mobility overall  Baseline:  Goal status: ONGOING  3.  Pt will report improved tolerance with walking/duties at church on Sunday morning and be able to perform those duties without significant difficulty. Baseline:  Goal status: ONGOING  4.  Pt will demo improved core strength as evidenced  by performance/progression of core exercises and improved bilat LE strength to 5/5 in hip flexion and knee flexion/extension and a 5# increase in bilat hip abd for improved performance of and tolerance with functional mobility skills.  Baseline:  Goal status:  PARTIALLY MET  5.  Pt will demo good control on stairs and report increased ease with performing stairs. Baseline:  Goal status: ONGOING  6.  Pt will be able to perform self care activities with R UE including bathing and dressing without significant pain and difficulty.   Goal status:  INITIAL   7.  Pt will report at least a 70% improvement in performing reaching including reaching overhead.   Goal  status:  INITIAL  8.  Pt will demo at least a 15-20 deg improvement in R shoulder flexion, scaption, and abd AROM for improved mobility and reaching.   Goal status: INITIAL   PLAN:  PT FREQUENCY: 2x/week  PT DURATION: 4 weeks  PLANNED INTERVENTIONS: Therapeutic exercises, Therapeutic activity, Neuromuscular re-education, Balance training, Gait training, Patient/Family education, Self Care, Joint mobilization, Stair training, Aquatic Therapy, Dry Needling, Electrical stimulation, Cryotherapy, Moist heat, scar mobilization, Taping, Manual therapy, and Re-evaluation.  PLAN FOR NEXT SESSION: Cont per lumbar fusion protocol.  Core strengthening per lumbar fusion protocol.  Cont with shoulder ROM and shoulder/scapular strengthening and stabilization.   Audie Clear III PT, DPT 10/03/23 5:51 PM

## 2023-10-03 ENCOUNTER — Encounter (HOSPITAL_BASED_OUTPATIENT_CLINIC_OR_DEPARTMENT_OTHER): Payer: Self-pay | Admitting: Physical Therapy

## 2023-10-04 ENCOUNTER — Encounter (HOSPITAL_BASED_OUTPATIENT_CLINIC_OR_DEPARTMENT_OTHER): Payer: Self-pay | Admitting: Physical Therapy

## 2023-10-04 ENCOUNTER — Ambulatory Visit (HOSPITAL_BASED_OUTPATIENT_CLINIC_OR_DEPARTMENT_OTHER): Payer: Medicare Other | Admitting: Physical Therapy

## 2023-10-04 DIAGNOSIS — M6281 Muscle weakness (generalized): Secondary | ICD-10-CM

## 2023-10-04 DIAGNOSIS — G8929 Other chronic pain: Secondary | ICD-10-CM

## 2023-10-04 DIAGNOSIS — M25611 Stiffness of right shoulder, not elsewhere classified: Secondary | ICD-10-CM

## 2023-10-04 DIAGNOSIS — M5459 Other low back pain: Secondary | ICD-10-CM

## 2023-10-04 NOTE — Therapy (Signed)
OUTPATIENT PHYSICAL THERAPY TREATMENT NOTE      Patient Name: James Barber MRN: [id number] DOB:[birth year], 77 y.o., male Today's Date: 10/04/2023  END OF SESSION:  PT End of Session - 10/04/23 1036     Visit Number 16    Number of Visits 18    Date for PT Re-Evaluation 10/12/23    Authorization Type BCBS MCR    PT Start Time 1027    PT Stop Time 1109    PT Time Calculation (min) 42 min    Activity Tolerance Patient tolerated treatment well    Behavior During Therapy WFL for tasks assessed/performed                       Past Medical History:  Diagnosis Date   Bowel obstruction (HCC)    Cancer (HCC)    prostate- "everything removed and clear"   Complication of anesthesia    Hard to wake and spikes fever   Diabetes mellitus without complication (HCC)    GERD (gastroesophageal reflux disease)    pt has not had issues with this in "a long time"   History of pulmonary embolus (PE)    Hypertension    Hypothyroidism    Ischemic stroke (HCC) 12/2022   Sleep apnea    Past Surgical History:  Procedure Laterality Date   ABDOMINAL SURGERY     "had ileostomy for about 4 months and had it reversed- due to salmonella infection- this happened in [place]"   ANTERIOR CERVICAL DECOMP/DISCECTOMY FUSION  11/12/2012   Procedure: ANTERIOR CERVICAL DECOMPRESSION/DISCECTOMY FUSION 2 LEVELS;  Surgeon: Barnett Abu, MD;  Location: [facility];  Service: Neurosurgery;  Laterality: N/A;  Right Side approach Cervical six-seven,Cervical seven -thoracic one Anterior cervical decompression/diskectomy/fusion   BACK SURGERY  2023   x2 (outpatient with Dr. Danielle Dess)   CARPAL TUNNEL RELEASE Bilateral    CERVICAL FUSION     c 3-4, C 4-5, C5-6   HERNIA REPAIR     right inguinal   LAPAROTOMY N/A 03/22/2015   Procedure: DIAGONOSTIC LAPAROSCOPY, LAPROSCOPIC LYSIS OF ADHESIONS FOR ONE HOUR, DISTAL SMALL BOWEL RESECTION;  Surgeon: [name], MD;  Location: [facility];  Service: General;   Laterality: N/A;   Patient Active Problem List   Diagnosis Date Noted   Spondylolisthesis at L4-L5 level 05/07/2023   Acute pulmonary embolus (HCC) 04/19/2015   Rash and nonspecific skin eruption 04/19/2015   Diabetes mellitus, type II (HCC) 03/30/2015   Elevated LFTs 03/30/2015   Abnormal liver function tests 03/30/2015   Incisional hernia, without obstruction or gangrene 03/21/2015   Recurrent SBO (small bowel obstruction) 03/15/2015   HYPERLIPIDEMIA 03/09/2008   Iron deficiency anemia 03/09/2008   GERD 03/09/2008   Bilateral inguinal hernia (BIH), Left > Right 03/09/2008    REFERRING PROVIDER: Barnett Abu, MD REFERRING PROVIDER (shoulder):  Marlon Pel, MD    REFERRING DIAG: M54.16 (ICD-10-CM) - Radiculopathy, lumbar region     M75.41  Impingement syndrome of right shoulder  Rationale for Evaluation and Treatment: Rehabilitation  THERAPY DIAG:  Other low back pain  Muscle weakness (generalized)  Chronic right shoulder pain  Stiffness of right shoulder, not elsewhere classified  ONSET DATE:  DOS 05/07/2023   SUBJECTIVE:  SUBJECTIVE STATEMENT: Pt denies any adverse effects after prior Rx.vvPt feels like he has a catch in his L post hip with gait.  Pt sees Dr. Danielle Dess tomorrow.       PERTINENT HISTORY:  L4-S1 fusion on 05/07/2023.  Pt has B/L/T restrictions.   Pt has had 2 prior lumbar surgeries.  Hx of 5 cervical surgeries including cervical fusion C2-T1.  DM type 2, peripheral neuropathy in bilat feet  CVA 12/2022--R sided visual deficits, some recall/processing deficits   Hx of PE in 2016 and 2021 HTN  PAIN:  NPRS:  "As long as I don't move it, it is fine"   Location:  R shoulder primarily anterior though feels deep  NPRS:  3/10 current and 4/10 in L post hip when it  catches Worst pain when standing up after sitting up. Location:  bilat sides of lumbar and central lumbar       PRECAUTIONS: Back and Other: Lumbar fusion, B/L/T restrictions, peripheral neuropathy, hx of cervical fusion, CVA    WEIGHT BEARING RESTRICTIONS: No  FALLS:  Has patient fallen in last 6 months? Yes. Number of falls 1  LIVING ENVIRONMENT: Lives with: lives with their family Lives in: 2 story home, but stays on 1st floor mainly Stairs: yes  OCCUPATION:  Pt is retired  PLOF: Independent  PATIENT GOALS: improve mobility, balance, strength, and tolerance with household chores.  To be able to perform woodworking.    OBJECTIVE:   DIAGNOSTIC FINDINGS:  Pt is post op.      TODAY'S TREATMENT:                                                                                                                                 Therapeutic Exercise:               Pt performed:                       Nustep at L4 x 5 mins, Ue's/LE's   Mini squats with UE support on counter with TrA 2x12   Step ups with TrA x10 each on 6 inch step with rail   Lateral step ups with TrA with rail x 10 bilat   Heel raises with Tra 2x10   Standing rows with TrA with GTB 2x10 each   Standing shoulder extension with TrA with GTB 2x10   Seated bilat ER with retraction with RTB 2x10                    Neuro Re-ed activities:   Tandem gait with rail and SBA x 2 laps   Standing on airex with NBOS 2x30 sec   Standing on airex 2x20 sec   Marching on airex 2x10 with UE support      PATIENT EDUCATION:  Education details: dx, exercise form, relevant anatomy, HEP, POC, and rationale of interventions.  Cont with HEP as instructed.  Person educated: Patient Education method: Explanation, Demonstration, Tactile cues, Verbal cues Education comprehension: verbalized understanding, returned demonstration, verbal cues required, tactile cues required, and needs further education  HOME EXERCISE  PROGRAM: Access Code: [email or web address] URL: [email or web address] Date: 07/31/2023 Prepared by: Aaron Edelman  Exercises - Supine Transversus Abdominis Bracing - Hands on Stomach  - 2 x daily - 7 x weekly - 2 sets - 10 reps - Log Roll  - 1 x daily - 1 x weekly - 1 sets - 1 reps - Supine March  - 1 x daily - 7 x weekly - 2 sets - 10 reps - Hooklying Clamshell with Resistance  - 1 x daily - 4-5 x weekly - 2 sets - 10 reps - Seated Long Arc Quad  - 1 x daily - 7 x weekly - 2 sets - 10 reps - Knee extension with Ankle Pumps  - 1 x daily - 7 x weekly - 2 sets - Clamshell  - 1 x daily - 5 x weekly - 2 sets - 10 reps   ASSESSMENT:  CLINICAL IMPRESSION: PT is advancing exercise intensity by increasing closed chain activities.  Pt tolerates exercises well and is progressing appropriately with protocol.  Pt performed exercises well with cuing for correct form and gives good effort with all exercises.  Pt demonstrates good form with squats.  He responded well to Rx having no increased pain after Rx though was fatigued.  He should benefit from continued skilled PT services per protocol to address impairments and goals and improve pain and function.     OBJECTIVE IMPAIRMENTS: decreased activity tolerance, decreased balance, decreased endurance, decreased mobility, difficulty walking, decreased ROM, decreased strength, and pain.   ACTIVITY LIMITATIONS: carrying, lifting, bending, standing, squatting, stairs, transfers, and locomotion level  PARTICIPATION LIMITATIONS: cleaning, community activity, and church  PERSONAL FACTORS: 3+ comorbidities: prior back surgeries, cervical fusion, CVA, DM Type, peripheral neuropathy  are also affecting patient's functional outcome.   REHAB POTENTIAL: Good  CLINICAL DECISION MAKING: Stable/uncomplicated  EVALUATION COMPLEXITY: Low   GOALS:   SHORT TERM GOALS: Target date: 08/28/2023   Pt will be independent and compliant with HEP for improved pain,  strength, and function. Baseline: Goal status: PARTIALLY MET  2.  Pt will progress with core exercises per protocol without adverse effects for improved strength and performance of functional mobility. Baseline:  Goal status: GOAL MET  3.  Pt will demo correct form and be independent with log rolling. Baseline:  Goal status: GOAL MET Target date:  08/14/2023  4.  Pt will demo increased gait speed with good stability.   Baseline:  Goal status:  ONGOING Target date:  09/04/2023  5.  Pt will report increased usage of R UE with daily activities.  Goal status:  INITIAL  Target date:   10/02/2023     LONG TERM GOALS: Target date: 10/12/2023  Pt will report he is able to perform transfers without increased pain.  Baseline:  Goal status: ONGOING  2.  Pt will report at least a 70% improvement in pain, sx's, and mobility overall  Baseline:  Goal status: ONGOING  3.  Pt will report improved tolerance with walking/duties at church on Sunday morning and be able to perform those duties without significant difficulty. Baseline:  Goal status: ONGOING  4.  Pt will demo improved core strength as evidenced by performance/progression of core exercises and improved bilat LE strength to 5/5 in hip flexion and knee flexion/extension and a 5# increase in bilat hip  abd for improved performance of and tolerance with functional mobility skills.  Baseline:  Goal status:  PARTIALLY MET  5.  Pt will demo good control on stairs and report increased ease with performing stairs. Baseline:  Goal status: ONGOING  6.  Pt will be able to perform self care activities with R UE including bathing and dressing without significant pain and difficulty.   Goal status:  INITIAL   7.  Pt will report at least a 70% improvement in performing reaching including reaching overhead.   Goal status:  INITIAL  8.  Pt will demo at least a 15-20 deg improvement in R shoulder flexion, scaption, and abd AROM for improved  mobility and reaching.   Goal status: INITIAL   PLAN:  PT FREQUENCY: 2x/week  PT DURATION: 4 weeks  PLANNED INTERVENTIONS: Therapeutic exercises, Therapeutic activity, Neuromuscular re-education, Balance training, Gait training, Patient/Family education, Self Care, Joint mobilization, Stair training, Aquatic Therapy, Dry Needling, Electrical stimulation, Cryotherapy, Moist heat, scar mobilization, Taping, Manual therapy, and Re-evaluation.  PLAN FOR NEXT SESSION: Cont per lumbar fusion protocol.  Core strengthening per lumbar fusion protocol.  Cont with shoulder ROM and shoulder/scapular strengthening and stabilization.  Review and update HEP.  Audie Clear III PT, DPT 10/05/23 12:12 AM

## 2023-10-05 NOTE — Therapy (Incomplete)
OUTPATIENT PHYSICAL THERAPY TREATMENT NOTE      Patient Name: James Barber MRN: [id number] DOB:1946-09-17, 77 y.o., male Today's Date: 10/04/2023  END OF SESSION:  PT End of Session - 10/04/23 1036     Visit Number 16    Number of Visits 18    Date for PT Re-Evaluation 10/12/23    Authorization Type BCBS MCR    PT Start Time 1027    PT Stop Time 1109    PT Time Calculation (min) 42 min    Activity Tolerance Patient tolerated treatment well    Behavior During Therapy WFL for tasks assessed/performed                       Past Medical History:  Diagnosis Date  . Bowel obstruction (HCC)   . Cancer [facility])    prostate- "everything removed and clear"  . Complication of anesthesia    Hard to wake and spikes fever  . Diabetes mellitus without complication (HCC)   . GERD (gastroesophageal reflux disease)    pt has not had issues with this in "a long time"  . History of pulmonary embolus (PE)   . Hypertension   . Hypothyroidism   . Ischemic stroke (HCC) 12/2022  . Sleep apnea    Past Surgical History:  Procedure Laterality Date  . ABDOMINAL SURGERY     "had ileostomy for about 4 months and had it reversed- due to salmonella infection- [place]  . ANTERIOR CERVICAL DECOMP/DISCECTOMY FUSION  11/12/2012   Procedure: ANTERIOR CERVICAL DECOMPRESSION/DISCECTOMY FUSION 2 LEVELS;  Surgeon: Barnett Abu, MD;  Location: [facility];  Service: Neurosurgery;  Laterality: N/A;  Right Side approach Cervical six-seven,Cervical seven -thoracic one Anterior cervical decompression/diskectomy/fusion  . BACK SURGERY  2023   x2 (outpatient with Dr. Danielle Dess)  . CARPAL TUNNEL RELEASE Bilateral   . CERVICAL FUSION     c 3-4, C 4-5, C5-6  . HERNIA REPAIR     right inguinal  . LAPAROTOMY N/A 03/22/2015   Procedure: DIAGONOSTIC LAPAROSCOPY, LAPROSCOPIC LYSIS OF ADHESIONS FOR ONE HOUR, DISTAL SMALL BOWEL RESECTION;  Surgeon: [name], MD;  Location: [facility];   Service: General;  Laterality: N/A;   Patient Active Problem List   Diagnosis Date Noted  . Spondylolisthesis at L4-L5 level 05/07/2023  . Acute pulmonary embolus (HCC) 04/19/2015  . Rash and nonspecific skin eruption 04/19/2015  . Diabetes mellitus, type II (HCC) 03/30/2015  . Elevated LFTs 03/30/2015  . Abnormal liver function tests 03/30/2015  . Incisional hernia, without obstruction or gangrene 03/21/2015  . Recurrent SBO (small bowel obstruction) 03/15/2015  . HYPERLIPIDEMIA 03/09/2008  . Iron deficiency anemia 03/09/2008  . GERD 03/09/2008  . Bilateral inguinal hernia (BIH), Left > Right 03/09/2008    REFERRING PROVIDER: Barnett Abu, MD REFERRING PROVIDER (shoulder):  Marlon Pel, MD    REFERRING DIAG: M54.16 (ICD-10-CM) - Radiculopathy, lumbar region     M75.41  Impingement syndrome of right shoulder  Rationale for Evaluation and Treatment: Rehabilitation  THERAPY DIAG:  Other low back pain  Muscle weakness (generalized)  Chronic right shoulder pain  Stiffness of right shoulder, not elsewhere classified  ONSET DATE:  DOS 05/07/2023   SUBJECTIVE:  SUBJECTIVE STATEMENT: Pt denies any adverse effects after prior Rx.vvPt feels like he has a catch in his L post hip with gait.  Pt sees Dr. Danielle Dess tomorrow.       PERTINENT HISTORY:  L4-S1 fusion on 05/07/2023.  Pt has B/L/T restrictions.   Pt has had 2 prior lumbar surgeries.  Hx of 5 cervical surgeries including cervical fusion C2-T1.  DM type 2, peripheral neuropathy in bilat feet  CVA 12/2022--R sided visual deficits, some recall/processing deficits   Hx of PE in 2016 and 2021 HTN  PAIN:  NPRS:  "As long as I don't move it, it is fine"   Location:  R shoulder primarily anterior though feels deep  NPRS:  3/10 current and  4/10 in L post hip when it catches Worst pain when standing up after sitting up. Location:  bilat sides of lumbar and central lumbar       PRECAUTIONS: Back and Other: Lumbar fusion, B/L/T restrictions, peripheral neuropathy, hx of cervical fusion, CVA    WEIGHT BEARING RESTRICTIONS: No  FALLS:  Has patient fallen in last 6 months? Yes. Number of falls 1  LIVING ENVIRONMENT: Lives with: lives with their family Lives in: 2 story home, but stays on 1st floor mainly Stairs: yes  OCCUPATION:  Pt is retired  PLOF: Independent  PATIENT GOALS: improve mobility, balance, strength, and tolerance with household chores.  To be able to perform woodworking.    OBJECTIVE:   DIAGNOSTIC FINDINGS:  Pt is post op.      TODAY'S TREATMENT:                                                                                                                                 Therapeutic Exercise:               Pt performed:                       Nustep at L4 x 5 mins, Ue's/LE's   Mini squats with UE support on counter with TrA 2x12   Step ups with TrA x10 each on 6 inch step with rail   Lateral step ups with TrA with rail x 10 bilat   Heel raises with Tra 2x10   Standing rows with TrA with GTB 2x10 each   Standing shoulder extension with TrA with GTB 2x10   Seated bilat ER with retraction with RTB 2x10                    Neuro Re-ed activities:   Tandem gait with rail and SBA x 2 laps   Standing on airex with NBOS 2x30 sec   Standing on airex 2x20 sec   Marching on airex 2x10 with UE support      PATIENT EDUCATION:  Education details: dx, exercise form, relevant anatomy, HEP, POC, and rationale of interventions.  Cont with HEP as instructed.  Person educated: Patient Education method: Explanation, Demonstration, Tactile cues, Verbal cues Education comprehension: verbalized understanding, returned demonstration, verbal cues required, tactile cues required, and needs further  education  HOME EXERCISE PROGRAM: Access Code: [email or web address] URL: [email or web address] Date: 07/31/2023 Prepared by: Aaron Edelman  Exercises - Supine Transversus Abdominis Bracing - Hands on Stomach  - 2 x daily - 7 x weekly - 2 sets - 10 reps - Log Roll  - 1 x daily - 1 x weekly - 1 sets - 1 reps - Supine March  - 1 x daily - 7 x weekly - 2 sets - 10 reps - Hooklying Clamshell with Resistance  - 1 x daily - 4-5 x weekly - 2 sets - 10 reps - Seated Long Arc Quad  - 1 x daily - 7 x weekly - 2 sets - 10 reps - Knee extension with Ankle Pumps  - 1 x daily - 7 x weekly - 2 sets - Clamshell  - 1 x daily - 5 x weekly - 2 sets - 10 reps   ASSESSMENT:  CLINICAL IMPRESSION: Pt is making progress as evidenced by subjective reports.  Pt performed exercises well with cuing for correct form and gives good effort with all exercises.  Pt is performing increased closed chain activities and is fatigued with exercises.  Pt requires UE support for tandem gait.  Pt demonstrates good form with squats.  He responded well to Rx having no increased pain after Rx.  He should benefit from continued skilled PT services per protocol to address impairments and goals and improve pain and function.    Fatiged, no increased pain  OBJECTIVE IMPAIRMENTS: decreased activity tolerance, decreased balance, decreased endurance, decreased mobility, difficulty walking, decreased ROM, decreased strength, and pain.   ACTIVITY LIMITATIONS: carrying, lifting, bending, standing, squatting, stairs, transfers, and locomotion level  PARTICIPATION LIMITATIONS: cleaning, community activity, and church  PERSONAL FACTORS: 3+ comorbidities: prior back surgeries, cervical fusion, CVA, DM Type, peripheral neuropathy  are also affecting patient's functional outcome.   REHAB POTENTIAL: Good  CLINICAL DECISION MAKING: Stable/uncomplicated  EVALUATION COMPLEXITY: Low   GOALS:   SHORT TERM GOALS: Target date:  08/28/2023   Pt will be independent and compliant with HEP for improved pain, strength, and function. Baseline: Goal status: PARTIALLY MET  2.  Pt will progress with core exercises per protocol without adverse effects for improved strength and performance of functional mobility. Baseline:  Goal status: GOAL MET  3.  Pt will demo correct form and be independent with log rolling. Baseline:  Goal status: GOAL MET Target date:  08/14/2023  4.  Pt will demo increased gait speed with good stability.   Baseline:  Goal status:  ONGOING Target date:  09/04/2023  5.  Pt will report increased usage of R UE with daily activities.  Goal status:  INITIAL  Target date:   10/02/2023     LONG TERM GOALS: Target date: 10/12/2023  Pt will report he is able to perform transfers without increased pain.  Baseline:  Goal status: ONGOING  2.  Pt will report at least a 70% improvement in pain, sx's, and mobility overall  Baseline:  Goal status: ONGOING  3.  Pt will report improved tolerance with walking/duties at church on Sunday morning and be able to perform those duties without significant difficulty. Baseline:  Goal status: ONGOING  4.  Pt will demo improved core strength as evidenced by performance/progression of core exercises and improved bilat LE strength to 5/5 in hip flexion  and knee flexion/extension and a 5# increase in bilat hip abd for improved performance of and tolerance with functional mobility skills.  Baseline:  Goal status:  PARTIALLY MET  5.  Pt will demo good control on stairs and report increased ease with performing stairs. Baseline:  Goal status: ONGOING  6.  Pt will be able to perform self care activities with R UE including bathing and dressing without significant pain and difficulty.   Goal status:  INITIAL   7.  Pt will report at least a 70% improvement in performing reaching including reaching overhead.   Goal status:  INITIAL  8.  Pt will demo at least a  15-20 deg improvement in R shoulder flexion, scaption, and abd AROM for improved mobility and reaching.   Goal status: INITIAL   PLAN:  PT FREQUENCY: 2x/week  PT DURATION: 4 weeks  PLANNED INTERVENTIONS: Therapeutic exercises, Therapeutic activity, Neuromuscular re-education, Balance training, Gait training, Patient/Family education, Self Care, Joint mobilization, Stair training, Aquatic Therapy, Dry Needling, Electrical stimulation, Cryotherapy, Moist heat, scar mobilization, Taping, Manual therapy, and Re-evaluation.  PLAN FOR NEXT SESSION: Cont per lumbar fusion protocol.  Core strengthening per lumbar fusion protocol.  Cont with shoulder ROM and shoulder/scapular strengthening and stabilization.   Audie Clear III PT, DPT 10/04/23 11:58 PM

## 2023-10-12 ENCOUNTER — Encounter (HOSPITAL_BASED_OUTPATIENT_CLINIC_OR_DEPARTMENT_OTHER): Payer: Self-pay | Admitting: Physical Therapy

## 2023-10-12 ENCOUNTER — Ambulatory Visit (HOSPITAL_BASED_OUTPATIENT_CLINIC_OR_DEPARTMENT_OTHER): Payer: Medicare Other | Attending: Neurological Surgery | Admitting: Physical Therapy

## 2023-10-12 DIAGNOSIS — M25511 Pain in right shoulder: Secondary | ICD-10-CM | POA: Diagnosis present

## 2023-10-12 DIAGNOSIS — M25611 Stiffness of right shoulder, not elsewhere classified: Secondary | ICD-10-CM | POA: Diagnosis present

## 2023-10-12 DIAGNOSIS — G8929 Other chronic pain: Secondary | ICD-10-CM | POA: Diagnosis present

## 2023-10-12 DIAGNOSIS — M6281 Muscle weakness (generalized): Secondary | ICD-10-CM | POA: Insufficient documentation

## 2023-10-12 DIAGNOSIS — M5459 Other low back pain: Secondary | ICD-10-CM | POA: Diagnosis present

## 2023-10-12 NOTE — Therapy (Signed)
OUTPATIENT PHYSICAL THERAPY TREATMENT NOTE      Patient Name: James Barber MRN: [id number] DOB:[birth year], 77 y.o., male Today's Date: 10/12/2023  END OF SESSION:  PT End of Session - 10/12/23 1226     Visit Number 17    Number of Visits 18    Date for PT Re-Evaluation 10/12/23    Authorization Type BCBS MCR    PT Start Time 1152    PT Stop Time 1232    PT Time Calculation (min) 40 min    Activity Tolerance Patient tolerated treatment well    Behavior During Therapy WFL for tasks assessed/performed                        Past Medical History:  Diagnosis Date   Bowel obstruction (HCC)    Cancer (HCC)    prostate- "everything removed and clear"   Complication of anesthesia    Hard to wake and spikes fever   Diabetes mellitus without complication (HCC)    GERD (gastroesophageal reflux disease)    pt has not had issues with this in "a long time"   History of pulmonary embolus (PE)    Hypertension    Hypothyroidism    Ischemic stroke (HCC) 12/2022   Sleep apnea    Past Surgical History:  Procedure Laterality Date   ABDOMINAL SURGERY     "had ileostomy for about 4 months and had it reversed- due to salmonella infection- this happened in [place]"   ANTERIOR CERVICAL DECOMP/DISCECTOMY FUSION  11/12/2012   Procedure: ANTERIOR CERVICAL DECOMPRESSION/DISCECTOMY FUSION 2 LEVELS;  Surgeon: Barnett Abu, MD;  Location: [facility];  Service: Neurosurgery;  Laterality: N/A;  Right Side approach Cervical six-seven,Cervical seven -thoracic one Anterior cervical decompression/diskectomy/fusion   BACK SURGERY  2023   x2 (outpatient with Dr. Danielle Dess)   CARPAL TUNNEL RELEASE Bilateral    CERVICAL FUSION     c 3-4, C 4-5, C5-6   HERNIA REPAIR     right inguinal   LAPAROTOMY N/A 03/22/2015   Procedure: DIAGONOSTIC LAPAROSCOPY, LAPROSCOPIC LYSIS OF ADHESIONS FOR ONE HOUR, DISTAL SMALL BOWEL RESECTION;  Surgeon: [name], MD;  Location: [facility];  Service: General;   Laterality: N/A;   Patient Active Problem List   Diagnosis Date Noted   Spondylolisthesis at L4-L5 level 05/07/2023   Acute pulmonary embolus (HCC) 04/19/2015   Rash and nonspecific skin eruption 04/19/2015   Diabetes mellitus, type II (HCC) 03/30/2015   Elevated LFTs 03/30/2015   Abnormal liver function tests 03/30/2015   Incisional hernia, without obstruction or gangrene 03/21/2015   Recurrent SBO (small bowel obstruction) 03/15/2015   HYPERLIPIDEMIA 03/09/2008   Iron deficiency anemia 03/09/2008   GERD 03/09/2008   Bilateral inguinal hernia (BIH), Left > Right 03/09/2008    REFERRING PROVIDER: Barnett Abu, MD REFERRING PROVIDER (shoulder):  Marlon Pel, MD    REFERRING DIAG: M54.16 (ICD-10-CM) - Radiculopathy, lumbar region     M75.41  Impingement syndrome of right shoulder  Rationale for Evaluation and Treatment: Rehabilitation  THERAPY DIAG:  Other low back pain  Muscle weakness (generalized)  Chronic right shoulder pain  Stiffness of right shoulder, not elsewhere classified  ONSET DATE:  DOS 05/07/2023   SUBJECTIVE:  SUBJECTIVE STATEMENT: Pt states he has a more difficult time with things over the past few weeks.  Pt states walking and balance have been more challenging.  Pt denies any adverse effects after prior Rx.  Pt states he has increased pain with activity and mobility.  He has a 3/10 pain currently which increases to 4/10 with ambulation and mobility.  Pt saw Dr. Danielle Dess and had x rays.  Pt states MD was pleased and released him from his care.  Pt sees Dr. Rennis Chris for his shoulder on Monday.  Pt states he performs his HEP very little.       PERTINENT HISTORY:  L4-S1 fusion on 05/07/2023.  Pt has B/L/T restrictions.   Pt has had 2 prior lumbar surgeries.  Hx of 5 cervical  surgeries including cervical fusion C2-T1.  DM type 2, peripheral neuropathy in bilat feet  CVA 12/2022--R sided visual deficits, some recall/processing deficits   Hx of PE in 2016 and 2021 HTN  PAIN:  NPRS:  No pain at rest, 4-7/10 pain with reaching Location:  R shoulder primarily anterior though feels deep  NPRS:  3/10 current Worst pain when standing up after sitting up. Location:  bilat sides of lumbar and central lumbar       PRECAUTIONS: Back and Other: Lumbar fusion, B/L/T restrictions, peripheral neuropathy, hx of cervical fusion, CVA    WEIGHT BEARING RESTRICTIONS: No  FALLS:  Has patient fallen in last 6 months? Yes. Number of falls 1  LIVING ENVIRONMENT: Lives with: lives with their family Lives in: 2 story home, but stays on 1st floor mainly Stairs: yes  OCCUPATION:  Pt is retired  PLOF: Independent  PATIENT GOALS: improve mobility, balance, strength, and tolerance with household chores.  To be able to perform woodworking.    OBJECTIVE:   DIAGNOSTIC FINDINGS:  Pt is post op.      TODAY'S TREATMENT:                                                                                                                                 Therapeutic Exercise:               Pt performed:                       Nustep at L4 x 5 mins, Ue's/LE's   Mini squats with UE support on rail with TrA 2x10-12   Sidestepping with bilat UE support on rail x 3 laps   Heel raises with Tra 2x10   Step ups with TrA x10 each on 6 inch step with rail   Lateral step ups with TrA with rail x 10 bilat     Standing rows with TrA with GTB 2x10 each   Standing shoulder extension with TrA with GTB 2x10   Standing (but back to wall) bilat ER with retraction with RTB 2x10    PT updated HEP and  gave pt a HEP handout.  PT educated pt in correct form and appropriate frequency.        PATIENT EDUCATION:  Education details: dx, exercise form, relevant anatomy, HEP, POC, and rationale of  interventions.  PT encouraged pt to be compliant with HEP. Person educated: Patient Education method: Explanation, Demonstration, Tactile cues, Verbal cues Education comprehension: verbalized understanding, returned demonstration, verbal cues required, tactile cues required, and needs further education  HOME EXERCISE PROGRAM: Access Code: [email or web address] URL: [email or web address] Date: 07/31/2023 Prepared by: Aaron Edelman  Exercises - Supine Transversus Abdominis Bracing - Hands on Stomach  - 2 x daily - 7 x weekly - 2 sets - 10 reps - Log Roll  - 1 x daily - 1 x weekly - 1 sets - 1 reps - Supine March  - 1 x daily - 7 x weekly - 2 sets - 10 reps - Hooklying Clamshell with Resistance  - 1 x daily - 4-5 x weekly - 2 sets - 10 reps - Seated Long Arc Quad  - 1 x daily - 7 x weekly - 2 sets - 10 reps - Knee extension with Ankle Pumps  - 1 x daily - 7 x weekly - 2 sets - Clamshell  - 1 x daily - 5 x weekly - 2 sets - 10 reps  Updated HEP: - Side Stepping with Counter Support  - 1 x daily - 6 x weekly - 2 sets - 10 reps - Heel Raises with Counter Support  - 1 x daily - 7 x weekly - 2 sets - 10 reps - Standing Shoulder Row with Anchored Resistance  - 1 x daily - 4-5 x weekly - 2 sets - 10 reps - Shoulder extension with resistance - Neutral  - 1 x daily - 4-5 x weekly - 2 sets - 10 reps   ASSESSMENT:  CLINICAL IMPRESSION: Pt states he has increased pain with activity and mobility.  Pt saw surgeon and has been released from his care.  Pt's R shoulder feels fine at rest though has increased pain with movement.  Pt reports performing HEP very little.  PT explained the importance of performing HEP and encouraged pt to be compliant with HEP.  PT worked on LE, shoulder, and postural strengthening and updated HEP today.  Pt states he feels unstable with mini squats even when PT had pt decrease depth of squats.  Pt required minimal cuing for correct form with mini squats.  PT didn't include  mini squats in HEP.  PT gave pt a HEP handout and pt demonstrates good understanding.  He responded well to Rx reporting no change in pain after Rx.  He should benefit from continued skilled PT services per protocol to address impairments and goals and improve pain and function.     OBJECTIVE IMPAIRMENTS: decreased activity tolerance, decreased balance, decreased endurance, decreased mobility, difficulty walking, decreased ROM, decreased strength, and pain.   ACTIVITY LIMITATIONS: carrying, lifting, bending, standing, squatting, stairs, transfers, and locomotion level  PARTICIPATION LIMITATIONS: cleaning, community activity, and church  PERSONAL FACTORS: 3+ comorbidities: prior back surgeries, cervical fusion, CVA, DM Type, peripheral neuropathy  are also affecting patient's functional outcome.   REHAB POTENTIAL: Good  CLINICAL DECISION MAKING: Stable/uncomplicated  EVALUATION COMPLEXITY: Low   GOALS:   SHORT TERM GOALS: Target date: 08/28/2023   Pt will be independent and compliant with HEP for improved pain, strength, and function. Baseline: Goal status: PARTIALLY MET  2.  Pt will progress with core exercises per  protocol without adverse effects for improved strength and performance of functional mobility. Baseline:  Goal status: GOAL MET  3.  Pt will demo correct form and be independent with log rolling. Baseline:  Goal status: GOAL MET Target date:  08/14/2023  4.  Pt will demo increased gait speed with good stability.   Baseline:  Goal status:  ONGOING Target date:  09/04/2023  5.  Pt will report increased usage of R UE with daily activities.  Goal status:  INITIAL  Target date:   10/02/2023     LONG TERM GOALS: Target date: 10/12/2023  Pt will report he is able to perform transfers without increased pain.  Baseline:  Goal status: ONGOING  2.  Pt will report at least a 70% improvement in pain, sx's, and mobility overall  Baseline:  Goal status: ONGOING  3.   Pt will report improved tolerance with walking/duties at church on Sunday morning and be able to perform those duties without significant difficulty. Baseline:  Goal status: ONGOING  4.  Pt will demo improved core strength as evidenced by performance/progression of core exercises and improved bilat LE strength to 5/5 in hip flexion and knee flexion/extension and a 5# increase in bilat hip abd for improved performance of and tolerance with functional mobility skills.  Baseline:  Goal status:  PARTIALLY MET  5.  Pt will demo good control on stairs and report increased ease with performing stairs. Baseline:  Goal status: ONGOING  6.  Pt will be able to perform self care activities with R UE including bathing and dressing without significant pain and difficulty.   Goal status:  INITIAL   7.  Pt will report at least a 70% improvement in performing reaching including reaching overhead.   Goal status:  INITIAL  8.  Pt will demo at least a 15-20 deg improvement in R shoulder flexion, scaption, and abd AROM for improved mobility and reaching.   Goal status: INITIAL   PLAN:  PT FREQUENCY: 2x/week  PT DURATION: 4 weeks  PLANNED INTERVENTIONS: Therapeutic exercises, Therapeutic activity, Neuromuscular re-education, Balance training, Gait training, Patient/Family education, Self Care, Joint mobilization, Stair training, Aquatic Therapy, Dry Needling, Electrical stimulation, Cryotherapy, Moist heat, scar mobilization, Taping, Manual therapy, and Re-evaluation.  PLAN FOR NEXT SESSION: Cont per lumbar fusion protocol.  Core strengthening per lumbar fusion protocol.  Cont with shoulder ROM and shoulder/scapular strengthening and stabilization.  PN next visit.  Audie Clear III PT, DPT 10/12/23 9:29 PM

## 2023-10-22 ENCOUNTER — Ambulatory Visit (HOSPITAL_BASED_OUTPATIENT_CLINIC_OR_DEPARTMENT_OTHER): Payer: Medicare Other | Admitting: Physical Therapy

## 2023-10-22 DIAGNOSIS — G8929 Other chronic pain: Secondary | ICD-10-CM

## 2023-10-22 DIAGNOSIS — M5459 Other low back pain: Secondary | ICD-10-CM | POA: Diagnosis not present

## 2023-10-22 DIAGNOSIS — M25611 Stiffness of right shoulder, not elsewhere classified: Secondary | ICD-10-CM

## 2023-10-22 DIAGNOSIS — M6281 Muscle weakness (generalized): Secondary | ICD-10-CM

## 2023-10-22 NOTE — Therapy (Signed)
OUTPATIENT PHYSICAL THERAPY TREATMENT NOTE      Patient Name: James Barber MRN: [id number] DOB:10-23-1946, 77 y.o., male Today's Date: 10/23/2023  END OF SESSION:  PT End of Session - 10/22/23 1456     Visit Number 18    Number of Visits 28    Date for PT Re-Evaluation 12/03/23    Authorization Type BCBS MCR    PT Start Time 1415    PT Stop Time 1454    PT Time Calculation (min) 39 min    Activity Tolerance Patient tolerated treatment well    Behavior During Therapy WFL for tasks assessed/performed                         Past Medical History:  Diagnosis Date   Bowel obstruction (HCC)    Cancer (HCC)    prostate- "everything removed and clear"   Complication of anesthesia    Hard to wake and spikes fever   Diabetes mellitus without complication (HCC)    GERD (gastroesophageal reflux disease)    pt has not had issues with this in "a long time"   History of pulmonary embolus (PE)    Hypertension    Hypothyroidism    Ischemic stroke (HCC) 12/2022   Sleep apnea    Past Surgical History:  Procedure Laterality Date   ABDOMINAL SURGERY     "had ileostomy for about 4 months and had it reversed- due to salmonella infection- [place]   ANTERIOR CERVICAL DECOMP/DISCECTOMY FUSION  11/12/2012   Procedure: ANTERIOR CERVICAL DECOMPRESSION/DISCECTOMY FUSION 2 LEVELS;  Surgeon: Barnett Abu, MD;  Location: [facility];  Service: Neurosurgery;  Laterality: N/A;  Right Side approach Cervical six-seven,Cervical seven -thoracic one Anterior cervical decompression/diskectomy/fusion   BACK SURGERY  2023   x2 (outpatient with Dr. Danielle Dess)   CARPAL TUNNEL RELEASE Bilateral    CERVICAL FUSION     c 3-4, C 4-5, C5-6   HERNIA REPAIR     right inguinal   LAPAROTOMY N/A 03/22/2015   Procedure: DIAGONOSTIC LAPAROSCOPY, LAPROSCOPIC LYSIS OF ADHESIONS FOR ONE HOUR, DISTAL SMALL BOWEL RESECTION;  Surgeon: [name], MD;  Location: [facility];  Service:  General;  Laterality: N/A;   Patient Active Problem List   Diagnosis Date Noted   Spondylolisthesis at L4-L5 level 05/07/2023   Acute pulmonary embolus (HCC) 04/19/2015   Rash and nonspecific skin eruption 04/19/2015   Diabetes mellitus, type II (HCC) 03/30/2015   Elevated LFTs 03/30/2015   Abnormal liver function tests 03/30/2015   Incisional hernia, without obstruction or gangrene 03/21/2015   Recurrent SBO (small bowel obstruction) 03/15/2015   HYPERLIPIDEMIA 03/09/2008   Iron deficiency anemia 03/09/2008   GERD 03/09/2008   Bilateral inguinal hernia (BIH), Left > Right 03/09/2008    REFERRING PROVIDER: Barnett Abu, MD REFERRING PROVIDER (shoulder):  Marlon Pel, MD    REFERRING DIAG: M54.16 (ICD-10-CM) - Radiculopathy, lumbar region     M75.41  Impingement syndrome of right shoulder  Rationale for Evaluation and Treatment: Rehabilitation  THERAPY DIAG:  Other low back pain  Muscle weakness (generalized)  Chronic right shoulder pain  Stiffness of right shoulder, not elsewhere classified  ONSET DATE:  DOS 05/07/2023   SUBJECTIVE:  SUBJECTIVE STATEMENT: Pt states his progress is slow.  Pt reports he has a lot of weakness in his legs.  Pt states he has balance issues with walking.  "I need to work on my balance."  Pt saw MD concerning shoulder and MD ordered an x ray.  Pt still becomes fatigued with walking around church performing his Sunday School activities and reports no improvement with that recently.  Pt reports he has performed his HEP more frequently.      PERTINENT HISTORY:  L4-S1 fusion on 05/07/2023.  Pt has B/L/T restrictions.   Pt has had 2 prior lumbar surgeries.  Hx of 5 cervical surgeries including cervical fusion C2-T1.  DM type 2, peripheral neuropathy in bilat  feet  CVA 12/2022--R sided visual deficits, some recall/processing deficits   Hx of PE in 2016 and 2021 HTN  PAIN:  NPRS:  No pain at rest, 4-7/10 pain with reaching Location:  R shoulder primarily anterior though feels deep  NPRS:  Pt states it's not really pain, just discomfort Location:  L > R    PRECAUTIONS: Back and Other: Lumbar fusion, B/L/T restrictions, peripheral neuropathy, hx of cervical fusion, CVA    WEIGHT BEARING RESTRICTIONS: No  FALLS:  Has patient fallen in last 6 months? Yes. Number of falls 1  LIVING ENVIRONMENT: Lives with: lives with their family Lives in: 2 story home, but stays on 1st floor mainly Stairs: yes  OCCUPATION:  Pt is retired  PLOF: Independent  PATIENT GOALS: improve mobility, balance, strength, and tolerance with household chores.  To be able to perform woodworking.    OBJECTIVE:   DIAGNOSTIC FINDINGS:  Pt is post op.      TODAY'S TREATMENT:                                                                                                                                 Therapeutic Exercise:               Pt performed:                       Nustep at L4 x 5 mins, Ue's/LE's   Standing rows with TrA with GTB 2x10 each   Standing shoulder extension with TrA with GTB 2x10   Standing bilat ER with retraction with RTB 2x10   Supine wand flexion 2x10   Shoulder pulleys in flexion   R shoulder flexion AROM:  139 deg with pain    Neuro Re-ed activities:   Sidestepping with UE support on rail x 3 laps at rail   Tandem gait with UE support on rail x 3 laps at rail   Standing on airex:  FA x 30 sec, FT 2x30 sec   Marching on airex approx 15 reps with UE support on rail   PATIENT EDUCATION:  Education details: dx, exercise form, relevant anatomy, HEP, POC, and rationale of interventions.  PT  encouraged pt to be compliant with HEP. Person educated: Patient Education method: Explanation, Demonstration, Tactile cues, Verbal  cues Education comprehension: verbalized understanding, returned demonstration, verbal cues required, tactile cues required, and needs further education  HOME EXERCISE PROGRAM: Access Code: [email or web address] URL: [email or web address] Date: 07/31/2023 Prepared by: Aaron Edelman  Exercises - Supine Transversus Abdominis Bracing - Hands on Stomach  - 2 x daily - 7 x weekly - 2 sets - 10 reps - Log Roll  - 1 x daily - 1 x weekly - 1 sets - 1 reps - Supine March  - 1 x daily - 7 x weekly - 2 sets - 10 reps - Hooklying Clamshell with Resistance  - 1 x daily - 4-5 x weekly - 2 sets - 10 reps - Seated Long Arc Quad  - 1 x daily - 7 x weekly - 2 sets - 10 reps - Knee extension with Ankle Pumps  - 1 x daily - 7 x weekly - 2 sets - Clamshell  - 1 x daily - 5 x weekly - 2 sets - 10 reps  Updated HEP: - Side Stepping with Counter Support  - 1 x daily - 6 x weekly - 2 sets - 10 reps - Heel Raises with Counter Support  - 1 x daily - 7 x weekly - 2 sets - 10 reps - Standing Shoulder Row with Anchored Resistance  - 1 x daily - 4-5 x weekly - 2 sets - 10 reps - Shoulder extension with resistance - Neutral  - 1 x daily - 4-5 x weekly - 2 sets - 10 reps   ASSESSMENT:  CLINICAL IMPRESSION: Pt is making progress in PT.  He reports his progress is slow.  He is progressing with protocol and demonstrates improved core and LE strength as evidenced by progression of exercises in protocol.  He continues to have limited tolerance to activity.  He reports improved tolerance with performing Sunday [employer] activities including walking overall though has not improved recently.  He has increased pain with activity and mobility.  He continues to report weakness in legs and difficulty with balance.  PT has been working on improving core and LE strength, shoulder and scapular strength and stabilization, mobility, and balance.  Pt is performing HEP more frequently since last visit.  Pt has no pain in R shoulder at rest  though has increased pain with movement.  Pt demonstrates an 18 deg improvement in R shoulder flexion AROM.  He responded well to Rx reporting no change in pain after Rx and was appropriately fatigued.  He should benefit from continued skilled PT services per protocol to address impairments and goals and improve pain and function.        OBJECTIVE IMPAIRMENTS: decreased activity tolerance, decreased balance, decreased endurance, decreased mobility, difficulty walking, decreased ROM, decreased strength, and pain.   ACTIVITY LIMITATIONS: carrying, lifting, bending, standing, squatting, stairs, transfers, and locomotion level  PARTICIPATION LIMITATIONS: cleaning, community activity, and church  PERSONAL FACTORS: 3+ comorbidities: prior back surgeries, cervical fusion, CVA, DM Type, peripheral neuropathy  are also affecting patient's functional outcome.   REHAB POTENTIAL: Good  CLINICAL DECISION MAKING: Stable/uncomplicated  EVALUATION COMPLEXITY: Low   GOALS:   SHORT TERM GOALS: Target date: 08/28/2023   Pt will be independent and compliant with HEP for improved pain, strength, and function. Baseline: Goal status: PARTIALLY MET  2.  Pt will progress with core exercises per protocol without adverse effects for improved strength and performance of functional mobility. Baseline:  Goal status: GOAL MET  3.  Pt will demo correct form and be independent with log rolling. Baseline:  Goal status: GOAL MET Target date:  08/14/2023  4.  Pt will demo increased gait speed with good stability.   Baseline:  Goal status:  ONGOING Target date:  09/04/2023  5.  Pt will report increased usage of R UE with daily activities.  Goal status:  INITIAL  Target date:   10/02/2023     LONG TERM GOALS: Target date: 12/03/2023  Pt will report he is able to perform transfers without increased pain.  Baseline:  Goal status: ONGOING  2.  Pt will report at least a 70% improvement in pain, sx's, and  mobility overall  Baseline:  Goal status: ONGOING  3.  Pt will report improved tolerance with walking/duties at church on Sunday morning and be able to perform those duties without significant difficulty. Baseline:  Goal status: PROGRESSING  4.  Pt will demo improved core strength as evidenced by performance/progression of core exercises and improved bilat LE strength to 5/5 in hip flexion and knee flexion/extension and a 5# increase in bilat hip abd for improved performance of and tolerance with functional mobility skills.  Baseline:  Goal status:  PARTIALLY MET  5.  Pt will demo good control on stairs and report increased ease with performing stairs. Baseline:  Goal status: ONGOING  6.  Pt will be able to perform self care activities with R UE including bathing and dressing without significant pain and difficulty.   Goal status:  INITIAL   7.  Pt will report at least a 70% improvement in performing reaching including reaching overhead.   Goal status:  INITIAL  8.  Pt will demo at least a 15-20 deg improvement in R shoulder flexion, scaption, and abd AROM for improved mobility and reaching.   Goal status: INITIAL   PLAN:  PT FREQUENCY: 2x/week  PT DURATION: 5-6 weeks  PLANNED INTERVENTIONS: Therapeutic exercises, Therapeutic activity, Neuromuscular re-education, Balance training, Gait training, Patient/Family education, Self Care, Joint mobilization, Stair training, Aquatic Therapy, Dry Needling, Electrical stimulation, Cryotherapy, Moist heat, scar mobilization, Taping, Manual therapy, and Re-evaluation.  PLAN FOR NEXT SESSION: Cont per lumbar fusion protocol.  Core strengthening per lumbar fusion protocol.  Cont with shoulder ROM and shoulder/scapular strengthening and stabilization.  Assess LE strength, shoulder ROM, and shoulder strength next visit.  Give FOTO next visit.  Assess stairs next visit.   Audie Clear III PT, DPT 10/23/23 10:05  PM

## 2023-10-23 ENCOUNTER — Encounter (HOSPITAL_BASED_OUTPATIENT_CLINIC_OR_DEPARTMENT_OTHER): Payer: Self-pay | Admitting: Physical Therapy

## 2023-10-24 ENCOUNTER — Ambulatory Visit (HOSPITAL_BASED_OUTPATIENT_CLINIC_OR_DEPARTMENT_OTHER): Payer: Medicare Other | Admitting: Physical Therapy

## 2023-10-24 DIAGNOSIS — M5459 Other low back pain: Secondary | ICD-10-CM

## 2023-10-24 DIAGNOSIS — M25611 Stiffness of right shoulder, not elsewhere classified: Secondary | ICD-10-CM

## 2023-10-24 DIAGNOSIS — M6281 Muscle weakness (generalized): Secondary | ICD-10-CM

## 2023-10-24 DIAGNOSIS — G8929 Other chronic pain: Secondary | ICD-10-CM

## 2023-10-24 NOTE — Therapy (Signed)
OUTPATIENT PHYSICAL THERAPY TREATMENT NOTE  Progress Note Reporting Period 09/18/2023 to 10/24/2023  See note below for Objective Data and Assessment of Progress/Goals.          Patient Name: James Barber MRN: [id number] DOB:[birth year], 77 y.o., male Today's Date: 10/25/2023  END OF SESSION:  PT End of Session - 10/24/23 1029     Visit Number 19    Number of Visits 28    Date for PT Re-Evaluation 12/03/23    Authorization Type BCBS MCR    PT Start Time 1025    PT Stop Time 1111    PT Time Calculation (min) 46 min    Activity Tolerance Patient tolerated treatment well    Behavior During Therapy WFL for tasks assessed/performed                         Past Medical History:  Diagnosis Date   Bowel obstruction (HCC)    Cancer (HCC)    prostate- "everything removed and clear"   Complication of anesthesia    Hard to wake and spikes fever   Diabetes mellitus without complication (HCC)    GERD (gastroesophageal reflux disease)    pt has not had issues with this in "a long time"   History of pulmonary embolus (PE)    Hypertension    Hypothyroidism    Ischemic stroke (HCC) 12/2022   Sleep apnea    Past Surgical History:  Procedure Laterality Date   ABDOMINAL SURGERY     "had ileostomy for about 4 months and had it reversed- due to salmonella infection- this happened in [place]"   ANTERIOR CERVICAL DECOMP/DISCECTOMY FUSION  11/12/2012   Procedure: ANTERIOR CERVICAL DECOMPRESSION/DISCECTOMY FUSION 2 LEVELS;  Surgeon: Barnett Abu, MD;  Location: [facility];  Service: Neurosurgery;  Laterality: N/A;  Right Side approach Cervical six-seven,Cervical seven -thoracic one Anterior cervical decompression/diskectomy/fusion   BACK SURGERY  2023   x2 (outpatient with Dr. Danielle Dess)   CARPAL TUNNEL RELEASE Bilateral    CERVICAL FUSION     c 3-4, C 4-5, C5-6   HERNIA REPAIR     right inguinal   LAPAROTOMY N/A 03/22/2015   Procedure: DIAGONOSTIC LAPAROSCOPY,  LAPROSCOPIC LYSIS OF ADHESIONS FOR ONE HOUR, DISTAL SMALL BOWEL RESECTION;  Surgeon: [name], MD;  Location: [facility];  Service: General;  Laterality: N/A;   Patient Active Problem List   Diagnosis Date Noted   Spondylolisthesis at L4-L5 level 05/07/2023   Acute pulmonary embolus (HCC) 04/19/2015   Rash and nonspecific skin eruption 04/19/2015   Diabetes mellitus, type II (HCC) 03/30/2015   Elevated LFTs 03/30/2015   Abnormal liver function tests 03/30/2015   Incisional hernia, without obstruction or gangrene 03/21/2015   Recurrent SBO (small bowel obstruction) 03/15/2015   HYPERLIPIDEMIA 03/09/2008   Iron deficiency anemia 03/09/2008   GERD 03/09/2008   Bilateral inguinal hernia (BIH), Left > Right 03/09/2008    REFERRING PROVIDER: Barnett Abu, MD REFERRING PROVIDER (shoulder):  Marlon Pel, MD    REFERRING DIAG: M54.16 (ICD-10-CM) - Radiculopathy, lumbar region     M75.41  Impingement syndrome of right shoulder  Rationale for Evaluation and Treatment: Rehabilitation  THERAPY DIAG:  Other low back pain  Muscle weakness (generalized)  Chronic right shoulder pain  Stiffness of right shoulder, not elsewhere classified  ONSET DATE:  DOS 05/07/2023   SUBJECTIVE:  SUBJECTIVE STATEMENT: Pt states he gets around better though is difficult.  "Balance is a big issue."  Pt reports 40% improvement overall.  Pt states his pain has not changed a lot.  Pt states he doesn't have the severe pain prior to surgery.  He states his pain relates to the surgery.  Pt reports improved performance of stairs.  Pt states the shot he received for his shoulder didn't help.  He reports no improvement in performing reaching including reaching overhead.   Pt reports he has performed his HEP more frequently.       PERTINENT HISTORY:  L4-S1 fusion on 05/07/2023.  Pt has B/L/T restrictions.   Pt has had 2 prior lumbar surgeries.  Hx of 5 cervical surgeries including cervical fusion C2-T1.  DM type 2, peripheral neuropathy in bilat feet  CVA 12/2022--R sided visual deficits, some recall/processing deficits   Hx of PE in 2016 and 2021 HTN  PAIN:  NPRS:  No pain at rest, 4-7/10 pain with reaching Location:  R shoulder primarily anterior though feels deep  NPRS:  3/10  Location:  L > R side of lumbar    PRECAUTIONS: Back and Other: Lumbar fusion, B/L/T restrictions, peripheral neuropathy, hx of cervical fusion, CVA    WEIGHT BEARING RESTRICTIONS: No  FALLS:  Has patient fallen in last 6 months? Yes. Number of falls 1  LIVING ENVIRONMENT: Lives with: lives with their family Lives in: 2 story home, but stays on 1st floor mainly Stairs: yes  OCCUPATION:  Pt is retired  PLOF: Independent  PATIENT GOALS: improve mobility, balance, strength, and tolerance with household chores.  To be able to perform woodworking.    OBJECTIVE:   DIAGNOSTIC FINDINGS:  Pt is post op.      TODAY'S TREATMENT:                                                                                                                                 LOWER EXTREMITY MMT:     MMT Right eval Left eval Right 10/11 Left 10/11 Right 11/20 Left 11/20  Hip flexion     5/5 4/5  4+/5  Hip extension            Hip abduction     23.8 22.3 29.4 28.2  Hip adduction            Hip internal rotation            Hip external rotation            Knee flexion 4/5 seated 4/5 seated 5/5 seated 4+/5 seated 5/5 seated 5/5 seated  Knee extension 4/5 4+/5 5/5 5/5 5/5 5/5  Ankle dorsiflexion 5/5 5/5        Ankle plantarflexion WFL seated WFL seated        Ankle inversion            Ankle eversion             (  Blank rows = not tested)     FOTO:  Initial/prior/Current:  50 / 56 / 56.  Goal of 55.  AROM/PROM Right 10/15   Left 10/15 Right 11/20  Shoulder flexion 121/133 with pain at end range 164 140 with pain  Shoulder scaption 130 with pain at end range 155 128 with pain at impingement range  Shoulder abduction 81 with pain 154 85 with pain  Shoulder adduction       Shoulder internal rotation 54 53 54  Shoulder external rotation 58/62 74 52/58  Elbow flexion       Elbow extension       Wrist flexion       Wrist extension       Wrist ulnar deviation       Wrist radial deviation       Wrist pronation       Wrist supination       (Blank rows = not tested)   Shoulder Strength:  R / L Flex:  tolerated slight resistance with pain / L:  5/5 IR:    4/5  /  5/5   Special Tests: Neer's Impingement test:  R:  positive though limited with ROM Leanord Asal Impingement test:  R: positive                  Pt performed:                       Nustep at L4 x 6 mins, Ue's/LE's   Squats with UE support 2x10   Step ups on 6 inch step with UE support 2x10    PATIENT EDUCATION:  Education details: dx, exercise form, relevant anatomy, HEP, POC, and rationale of interventions.  PT encouraged pt to be compliant with HEP. Person educated: Patient Education method: Explanation, Demonstration, Tactile cues, Verbal cues Education comprehension: verbalized understanding, returned demonstration, verbal cues required, tactile cues required, and needs further education  HOME EXERCISE PROGRAM: Access Code: [email or web address] URL: [email or web address] Date: 07/31/2023 Prepared by: Aaron Edelman  Exercises - Supine Transversus Abdominis Bracing - Hands on Stomach  - 2 x daily - 7 x weekly - 2 sets - 10 reps - Log Roll  - 1 x daily - 1 x weekly - 1 sets - 1 reps - Supine March  - 1 x daily - 7 x weekly - 2 sets - 10 reps - Hooklying Clamshell with Resistance  - 1 x daily - 4-5 x weekly - 2 sets - 10 reps - Seated Long Arc Quad  - 1 x daily - 7 x weekly - 2 sets - 10 reps - Knee extension with Ankle Pumps  -  1 x daily - 7 x weekly - 2 sets - Clamshell  - 1 x daily - 5 x weekly - 2 sets - 10 reps  Updated HEP: - Side Stepping with Counter Support  - 1 x daily - 6 x weekly - 2 sets - 10 reps - Heel Raises with Counter Support  - 1 x daily - 7 x weekly - 2 sets - 10 reps - Standing Shoulder Row with Anchored Resistance  - 1 x daily - 4-5 x weekly - 2 sets - 10 reps - Shoulder extension with resistance - Neutral  - 1 x daily - 4-5 x weekly - 2 sets - 10 reps   ASSESSMENT:  CLINICAL IMPRESSION: Pt is making progress in PT.  Pt reports 40% improvement overall though states his  pain has not changed a lot. He is not having the severe pain prior to surgery.  He continues to have pain in R shoulder and reports no improvement with reaching activities.  Pt reports improved performance of stairs.  Pt demonstrates a good improvement in R shoulder flexion AROM and had worse ER ROM.  Pt demonstrates improved bilat LE strength as evidenced by MMT and HHD testing.  Pt continues to have weakness in R shoulder.  Pt has shoulder pain with ROM testing and also shoulder flexion strength. Pt partially met LTG's #2,4,8.  He responded well to Rx and should benefit from cont skilled PT services to address impairments and goals and to improve pain and function.     OBJECTIVE IMPAIRMENTS: decreased activity tolerance, decreased balance, decreased endurance, decreased mobility, difficulty walking, decreased ROM, decreased strength, and pain.   ACTIVITY LIMITATIONS: carrying, lifting, bending, standing, squatting, stairs, transfers, and locomotion level  PARTICIPATION LIMITATIONS: cleaning, community activity, and church  PERSONAL FACTORS: 3+ comorbidities: prior back surgeries, cervical fusion, CVA, DM Type, peripheral neuropathy  are also affecting patient's functional outcome.   REHAB POTENTIAL: Good  CLINICAL DECISION MAKING: Stable/uncomplicated  EVALUATION COMPLEXITY: Low   GOALS:   SHORT TERM GOALS: Target  date: 08/28/2023   Pt will be independent and compliant with HEP for improved pain, strength, and function. Baseline: Goal status: PARTIALLY MET  2.  Pt will progress with core exercises per protocol without adverse effects for improved strength and performance of functional mobility. Baseline:  Goal status: GOAL MET  3.  Pt will demo correct form and be independent with log rolling. Baseline:  Goal status: GOAL MET Target date:  08/14/2023  4.  Pt will demo increased gait speed with good stability.   Baseline:  Goal status:  ONGOING Target date:  09/04/2023  5.  Pt will report increased usage of R UE with daily activities.  Goal status:  INITIAL  Target date:   10/02/2023     LONG TERM GOALS: Target date: 12/03/2023  Pt will report he is able to perform transfers without increased pain.  Baseline:  Goal status: ONGOING  2.  Pt will report at least a 70% improvement in pain, sx's, and mobility overall  Baseline:  Goal status:  55% MET  3.  Pt will report improved tolerance with walking/duties at church on Sunday morning and be able to perform those duties without significant difficulty. Baseline:  Goal status: PROGRESSING  4.  Pt will demo improved core strength as evidenced by performance/progression of core exercises and improved bilat LE strength to 5/5 in hip flexion and knee flexion/extension and a 5# increase in bilat hip abd for improved performance of and tolerance with functional mobility skills.  Baseline:  Goal status:  PARTIALLY MET  5.  Pt will demo good control on stairs and report increased ease with performing stairs. Baseline:  Goal status: ONGOING  6.  Pt will be able to perform self care activities with R UE including bathing and dressing without significant pain and difficulty.   Goal status:  INITIAL   7.  Pt will report at least a 70% improvement in performing reaching including reaching overhead.   Goal status:  ONGOING  8.  Pt will demo at  least a 15-20 deg improvement in R shoulder flexion, scaption, and abd AROM for improved mobility and reaching.   Goal status:  33% MET   PLAN:  PT FREQUENCY: 2x/week  PT DURATION: 5-6 weeks  PLANNED INTERVENTIONS: Therapeutic  exercises, Therapeutic activity, Neuromuscular re-education, Balance training, Gait training, Patient/Family education, Self Care, Joint mobilization, Stair training, Aquatic Therapy, Dry Needling, Electrical stimulation, Cryotherapy, Moist heat, scar mobilization, Taping, Manual therapy, and Re-evaluation.  PLAN FOR NEXT SESSION: Cont per lumbar fusion protocol.  Core strengthening per lumbar fusion protocol.  Cont with shoulder ROM and shoulder/scapular strengthening and stabilization.     Audie Clear III PT, DPT 10/25/23 4:56 PM

## 2023-10-25 ENCOUNTER — Encounter (HOSPITAL_BASED_OUTPATIENT_CLINIC_OR_DEPARTMENT_OTHER): Payer: Self-pay | Admitting: Physical Therapy

## 2023-10-29 ENCOUNTER — Encounter (HOSPITAL_BASED_OUTPATIENT_CLINIC_OR_DEPARTMENT_OTHER): Payer: Self-pay | Admitting: Physical Therapy

## 2023-10-29 ENCOUNTER — Ambulatory Visit (HOSPITAL_BASED_OUTPATIENT_CLINIC_OR_DEPARTMENT_OTHER): Payer: Medicare Other | Admitting: Physical Therapy

## 2023-10-29 DIAGNOSIS — M25611 Stiffness of right shoulder, not elsewhere classified: Secondary | ICD-10-CM

## 2023-10-29 DIAGNOSIS — M5459 Other low back pain: Secondary | ICD-10-CM

## 2023-10-29 DIAGNOSIS — M6281 Muscle weakness (generalized): Secondary | ICD-10-CM

## 2023-10-29 DIAGNOSIS — G8929 Other chronic pain: Secondary | ICD-10-CM

## 2023-10-29 NOTE — Therapy (Signed)
OUTPATIENT PHYSICAL THERAPY TREATMENT NOTE         Patient Name: James Barber MRN: [id number] DOB:10-24-1946, 77 y.o., male Today's Date: 10/29/2023  END OF SESSION:  PT End of Session - 10/29/23 1036     Visit Number 20    Number of Visits 28    Date for PT Re-Evaluation 12/03/23    Authorization Type BCBS MCR    PT Start Time 1033   pt arrived late to Rx.   PT Stop Time 1104    PT Time Calculation (min) 31 min    Activity Tolerance Patient tolerated treatment well    Behavior During Therapy WFL for tasks assessed/performed                          Past Medical History:  Diagnosis Date   Bowel obstruction (HCC)    Cancer (HCC)    prostate- "everything removed and clear"   Complication of anesthesia    Hard to wake and spikes fever   Diabetes mellitus without complication (HCC)    GERD (gastroesophageal reflux disease)    pt has not had issues with this in "a long time"   History of pulmonary embolus (PE)    Hypertension    Hypothyroidism    Ischemic stroke (HCC) 12/2022   Sleep apnea    Past Surgical History:  Procedure Laterality Date   ABDOMINAL SURGERY     "had ileostomy for about 4 months and had it reversed- due to salmonella infection- [place]   ANTERIOR CERVICAL DECOMP/DISCECTOMY FUSION  11/12/2012   Procedure: ANTERIOR CERVICAL DECOMPRESSION/DISCECTOMY FUSION 2 LEVELS;  Surgeon: Barnett Abu, MD;  Location: [facility];  Service: Neurosurgery;  Laterality: N/A;  Right Side approach Cervical six-seven,Cervical seven -thoracic one Anterior cervical decompression/diskectomy/fusion   BACK SURGERY  2023   x2 (outpatient with Dr. Danielle Dess)   CARPAL TUNNEL RELEASE Bilateral    CERVICAL FUSION     c 3-4, C 4-5, C5-6   HERNIA REPAIR     right inguinal   LAPAROTOMY N/A 03/22/2015   Procedure: DIAGONOSTIC LAPAROSCOPY, LAPROSCOPIC LYSIS OF ADHESIONS FOR ONE HOUR, DISTAL SMALL BOWEL RESECTION;  Surgeon: [name], MD;   Location: [facility];  Service: General;  Laterality: N/A;   Patient Active Problem List   Diagnosis Date Noted   Spondylolisthesis at L4-L5 level 05/07/2023   Acute pulmonary embolus (HCC) 04/19/2015   Rash and nonspecific skin eruption 04/19/2015   Diabetes mellitus, type II (HCC) 03/30/2015   Elevated LFTs 03/30/2015   Abnormal liver function tests 03/30/2015   Incisional hernia, without obstruction or gangrene 03/21/2015   Recurrent SBO (small bowel obstruction) 03/15/2015   HYPERLIPIDEMIA 03/09/2008   Iron deficiency anemia 03/09/2008   GERD 03/09/2008   Bilateral inguinal hernia (BIH), Left > Right 03/09/2008    REFERRING PROVIDER: Barnett Abu, MD REFERRING PROVIDER (shoulder):  Marlon Pel, MD    REFERRING DIAG: M54.16 (ICD-10-CM) - Radiculopathy, lumbar region     M75.41  Impingement syndrome of right shoulder  Rationale for Evaluation and Treatment: Rehabilitation  THERAPY DIAG:  Other low back pain  Muscle weakness (generalized)  Chronic right shoulder pain  Stiffness of right shoulder, not elsewhere classified  ONSET DATE:  DOS 05/07/2023   SUBJECTIVE:  SUBJECTIVE STATEMENT: Pt was very fatigued after Sunday School activities yesterday.  Pt denies any adverse effects after prior Rx.  Pt denies any new functional improvement.  He hasn't been performing HEP.    PERTINENT HISTORY:  L4-S1 fusion on 05/07/2023.  Pt has B/L/T restrictions.   Pt has had 2 prior lumbar surgeries.  Hx of 5 cervical surgeries including cervical fusion C2-T1.  DM type 2, peripheral neuropathy in bilat feet  CVA 12/2022--R sided visual deficits, some recall/processing deficits   Hx of PE in 2016 and 2021 HTN  PAIN:  Pt states "It's not so much pain, as difficulty with movement."  More of an  irritation in lumbar.   Pt denies R shoulder pain at rest, but has pain with activity.      PRECAUTIONS: Back and Other: Lumbar fusion, B/L/T restrictions, peripheral neuropathy, hx of cervical fusion, CVA    WEIGHT BEARING RESTRICTIONS: No  FALLS:  Has patient fallen in last 6 months? Yes. Number of falls 1  LIVING ENVIRONMENT: Lives with: lives with their family Lives in: 2 story home, but stays on 1st floor mainly Stairs: yes  OCCUPATION:  Pt is retired  PLOF: Independent  PATIENT GOALS: improve mobility, balance, strength, and tolerance with household chores.  To be able to perform woodworking.    OBJECTIVE:   DIAGNOSTIC FINDINGS:  Pt is post op.      TODAY'S TREATMENT:                                                                                                                                                 Pt performed:                       Nustep at L4 x 5 mins, Ue's/LE's   Pulleys in flexion and scaption x 2 mins each     Supine wand flexion 2x10    Supine alt UE/LE 2x10    Step ups on 6 inch step with UE support x10 each LE with TrA      Pt received supine manual HS stretch with passive AP's 2x30 sec bilat     PATIENT EDUCATION:  Education details: dx, exercise form, relevant anatomy, HEP, POC, and rationale of interventions.  PT encouraged pt to be compliant with HEP. Person educated: Patient Education method: Explanation, Demonstration, Tactile cues, Verbal cues Education comprehension: verbalized understanding, returned demonstration, verbal cues required, tactile cues required, and needs further education  HOME EXERCISE PROGRAM: Access Code: [email or web address] URL: [email or web address] Date: 07/31/2023 Prepared by: Aaron Edelman  Exercises - Supine Transversus Abdominis Bracing - Hands on Stomach  - 2 x daily - 7 x weekly - 2 sets - 10 reps - Log Roll  - 1 x daily - 1 x weekly - 1 sets - 1 reps - Supine March  - 1 x  daily - 7 x  weekly - 2 sets - 10 reps - Hooklying Clamshell with Resistance  - 1 x daily - 4-5 x weekly - 2 sets - 10 reps - Seated Long Arc Quad  - 1 x daily - 7 x weekly - 2 sets - 10 reps - Knee extension with Ankle Pumps  - 1 x daily - 7 x weekly - 2 sets - Clamshell  - 1 x daily - 5 x weekly - 2 sets - 10 reps  Updated HEP: - Side Stepping with Counter Support  - 1 x daily - 6 x weekly - 2 sets - 10 reps - Heel Raises with Counter Support  - 1 x daily - 7 x weekly - 2 sets - 10 reps - Standing Shoulder Row with Anchored Resistance  - 1 x daily - 4-5 x weekly - 2 sets - 10 reps - Shoulder extension with resistance - Neutral  - 1 x daily - 4-5 x weekly - 2 sets - 10 reps   ASSESSMENT:  CLINICAL IMPRESSION: Rx time limited due to pt arriving late.  Pt continues to have increased fatigue with walking performing Sunday School activities on Sunday AM.  He denies shoulder pain at rest though has increased pain with movement.  Pt has tightness in bilat HS.  Pt performed exercises well with cuing and instruction in correct form.  His R LE fatigued with step ups and his R knee buckled on 1 repetition.  Pt did not fall.  He caught himself and PT provided CGA.  Pt responded well to Rx reporting no increased pain after Rx.  Pt should benefit from cont skilled PT services to address impairments and goals and to improve pain and function.     OBJECTIVE IMPAIRMENTS: decreased activity tolerance, decreased balance, decreased endurance, decreased mobility, difficulty walking, decreased ROM, decreased strength, and pain.   ACTIVITY LIMITATIONS: carrying, lifting, bending, standing, squatting, stairs, transfers, and locomotion level  PARTICIPATION LIMITATIONS: cleaning, community activity, and church  PERSONAL FACTORS: 3+ comorbidities: prior back surgeries, cervical fusion, CVA, DM Type, peripheral neuropathy  are also affecting patient's functional outcome.   REHAB POTENTIAL: Good  CLINICAL DECISION MAKING:  Stable/uncomplicated  EVALUATION COMPLEXITY: Low   GOALS:   SHORT TERM GOALS: Target date: 08/28/2023   Pt will be independent and compliant with HEP for improved pain, strength, and function. Baseline: Goal status: PARTIALLY MET  2.  Pt will progress with core exercises per protocol without adverse effects for improved strength and performance of functional mobility. Baseline:  Goal status: GOAL MET  3.  Pt will demo correct form and be independent with log rolling. Baseline:  Goal status: GOAL MET Target date:  08/14/2023  4.  Pt will demo increased gait speed with good stability.   Baseline:  Goal status:  ONGOING Target date:  09/04/2023  5.  Pt will report increased usage of R UE with daily activities.  Goal status:  INITIAL  Target date:   10/02/2023     LONG TERM GOALS: Target date: 12/03/2023  Pt will report he is able to perform transfers without increased pain.  Baseline:  Goal status: ONGOING  2.  Pt will report at least a 70% improvement in pain, sx's, and mobility overall  Baseline:  Goal status:  55% MET  3.  Pt will report improved tolerance with walking/duties at church on Sunday morning and be able to perform those duties without significant difficulty. Baseline:  Goal status: PROGRESSING  4.  Pt will demo improved core strength as evidenced by performance/progression of core exercises and improved bilat LE strength to 5/5 in hip flexion and knee flexion/extension and a 5# increase in bilat hip abd for improved performance of and tolerance with functional mobility skills.  Baseline:  Goal status:  PARTIALLY MET  5.  Pt will demo good control on stairs and report increased ease with performing stairs. Baseline:  Goal status: ONGOING  6.  Pt will be able to perform self care activities with R UE including bathing and dressing without significant pain and difficulty.   Goal status:  INITIAL   7.  Pt will report at least a 70% improvement in  performing reaching including reaching overhead.   Goal status:  ONGOING  8.  Pt will demo at least a 15-20 deg improvement in R shoulder flexion, scaption, and abd AROM for improved mobility and reaching.   Goal status:  33% MET   PLAN:  PT FREQUENCY: 2x/week  PT DURATION: 5-6 weeks  PLANNED INTERVENTIONS: Therapeutic exercises, Therapeutic activity, Neuromuscular re-education, Balance training, Gait training, Patient/Family education, Self Care, Joint mobilization, Stair training, Aquatic Therapy, Dry Needling, Electrical stimulation, Cryotherapy, Moist heat, scar mobilization, Taping, Manual therapy, and Re-evaluation.  PLAN FOR NEXT SESSION: Cont per lumbar fusion protocol.  Core strengthening per lumbar fusion protocol.  Cont with shoulder ROM and shoulder/scapular strengthening and stabilization.     Audie Clear III PT, DPT 10/29/23 3:40 PM

## 2023-11-06 ENCOUNTER — Ambulatory Visit (HOSPITAL_BASED_OUTPATIENT_CLINIC_OR_DEPARTMENT_OTHER): Payer: Medicare Other | Attending: Neurological Surgery | Admitting: Physical Therapy

## 2023-11-06 DIAGNOSIS — M6281 Muscle weakness (generalized): Secondary | ICD-10-CM

## 2023-11-06 DIAGNOSIS — M25511 Pain in right shoulder: Secondary | ICD-10-CM | POA: Insufficient documentation

## 2023-11-06 DIAGNOSIS — M5459 Other low back pain: Secondary | ICD-10-CM | POA: Diagnosis present

## 2023-11-06 DIAGNOSIS — G8929 Other chronic pain: Secondary | ICD-10-CM

## 2023-11-06 DIAGNOSIS — M25611 Stiffness of right shoulder, not elsewhere classified: Secondary | ICD-10-CM | POA: Diagnosis present

## 2023-11-06 NOTE — Therapy (Signed)
OUTPATIENT PHYSICAL THERAPY TREATMENT NOTE         Patient Name: James Barber MRN: [id number] DOB:1946/05/14, 76 y.o., male Today's Date: 11/07/2023  END OF SESSION:  PT End of Session - 11/06/23 1116     Visit Number 21    Number of Visits 28    Date for PT Re-Evaluation 12/03/23    Authorization Type BCBS MCR    PT Start Time 1111    PT Stop Time 1149    PT Time Calculation (min) 38 min    Activity Tolerance Patient tolerated treatment well    Behavior During Therapy WFL for tasks assessed/performed                          Past Medical History:  Diagnosis Date   Bowel obstruction (HCC)    Cancer (HCC)    prostate- "everything removed and clear"   Complication of anesthesia    Hard to wake and spikes fever   Diabetes mellitus without complication (HCC)    GERD (gastroesophageal reflux disease)    pt has not had issues with this in "a long time"   History of pulmonary embolus (PE)    Hypertension    Hypothyroidism    Ischemic stroke (HCC) 12/2022   Sleep apnea    Past Surgical History:  Procedure Laterality Date   ABDOMINAL SURGERY     "had ileostomy for about 4 months and had it reversed- due to salmonella infection- this happened in [place]"   ANTERIOR CERVICAL DECOMP/DISCECTOMY FUSION  11/12/2012   Procedure: ANTERIOR CERVICAL DECOMPRESSION/DISCECTOMY FUSION 2 LEVELS;  Surgeon: Barnett Abu, MD;  Location: [facility];  Service: Neurosurgery;  Laterality: N/A;  Right Side approach Cervical six-seven,Cervical seven -thoracic one Anterior cervical decompression/diskectomy/fusion   BACK SURGERY  2023   x2 (outpatient with Dr. Danielle Dess)   CARPAL TUNNEL RELEASE Bilateral    CERVICAL FUSION     c 3-4, C 4-5, C5-6   HERNIA REPAIR     right inguinal   LAPAROTOMY N/A 03/22/2015   Procedure: DIAGONOSTIC LAPAROSCOPY, LAPROSCOPIC LYSIS OF ADHESIONS FOR ONE HOUR, DISTAL SMALL BOWEL RESECTION;  Surgeon: [name], MD;  Location: [facility];  Service:  General;  Laterality: N/A;   Patient Active Problem List   Diagnosis Date Noted   Spondylolisthesis at L4-L5 level 05/07/2023   Acute pulmonary embolus (HCC) 04/19/2015   Rash and nonspecific skin eruption 04/19/2015   Diabetes mellitus, type II (HCC) 03/30/2015   Elevated LFTs 03/30/2015   Abnormal liver function tests 03/30/2015   Incisional hernia, without obstruction or gangrene 03/21/2015   Recurrent SBO (small bowel obstruction) 03/15/2015   HYPERLIPIDEMIA 03/09/2008   Iron deficiency anemia 03/09/2008   GERD 03/09/2008   Bilateral inguinal hernia (BIH), Left > Right 03/09/2008    REFERRING PROVIDER: Barnett Abu, MD REFERRING PROVIDER (shoulder):  Marlon Pel, MD    REFERRING DIAG: M54.16 (ICD-10-CM) - Radiculopathy, lumbar region     M75.41  Impingement syndrome of right shoulder  Rationale for Evaluation and Treatment: Rehabilitation  THERAPY DIAG:  Other low back pain  Muscle weakness (generalized)  Chronic right shoulder pain  Stiffness of right shoulder, not elsewhere classified  ONSET DATE:  DOS 05/07/2023   SUBJECTIVE:  SUBJECTIVE STATEMENT: Pt had a MRI on R shoulder and sees MD tomorrow to discuss results.  Pt was fatigued with putting out Christmas decorations/lights in his home.  Pt reports no change in tolerance with Sunday School activities.  Pt states his progress is slow.  Pt states he didn't perform his exercises much over the weekend, but was doing them more prior to the weekend.  Pt denies any adverse effects after prior Rx.     PERTINENT HISTORY:  L4-S1 fusion on 05/07/2023.  Pt has B/L/T restrictions.   Pt has had 2 prior lumbar surgeries.  Hx of 5 cervical surgeries including cervical fusion C2-T1.  DM type 2, peripheral neuropathy in bilat feet  CVA  12/2022--R sided visual deficits, some recall/processing deficits   Hx of PE in 2016 and 2021 HTN  PAIN:  Pt reports soreness in lumbar though denies pain.  Pt states his back feels tired.  Pt denies R shoulder pain at rest, but has pain with activity.      PRECAUTIONS: Back and Other: Lumbar fusion, B/L/T restrictions, peripheral neuropathy, hx of cervical fusion, CVA    WEIGHT BEARING RESTRICTIONS: No  FALLS:  Has patient fallen in last 6 months? Yes. Number of falls 1  LIVING ENVIRONMENT: Lives with: lives with their family Lives in: 2 story home, but stays on 1st floor mainly Stairs: yes  OCCUPATION:  Pt is retired  PLOF: Independent  PATIENT GOALS: improve mobility, balance, strength, and tolerance with household chores.  To be able to perform woodworking.    OBJECTIVE:   DIAGNOSTIC FINDINGS:  Pt is post op.      TODAY'S TREATMENT:                                                                                                                                                Therapeutic Exercise:   Pt performed:                       Nustep at L5 x 6 mins, Ue's/LE's   Pulleys in flexion x 2 mins and scaption x 1 min    Standing rows with TrA with GTB 2x10 each                        Standing shoulder extension with TrA with GTB 2x10                        Standing bilat ER with retraction with RTB 2x10 with back to wall                  Standing on airex with FT x30 sec      Therapeutic Activities:   Step ups on 6 inch step with UE support x10 each LE with TrA  Lateral  step ups on 4 inch step with TrA with UE support x 10 reps bilat  Marching on airex x 10 with UE support on rail     PATIENT EDUCATION:  Education details: dx, exercise form, relevant anatomy, HEP, POC, and rationale of interventions.  PT encouraged pt to be compliant with HEP. Person educated: Patient Education method: Explanation, Demonstration, Tactile cues, Verbal cues Education  comprehension: verbalized understanding, returned demonstration, verbal cues required, tactile cues required, and needs further education  HOME EXERCISE PROGRAM: Access Code: [email or web address] URL: [email or web address] Date: 07/31/2023 Prepared by: Aaron Edelman  Exercises - Supine Transversus Abdominis Bracing - Hands on Stomach  - 2 x daily - 7 x weekly - 2 sets - 10 reps - Log Roll  - 1 x daily - 1 x weekly - 1 sets - 1 reps - Supine March  - 1 x daily - 7 x weekly - 2 sets - 10 reps - Hooklying Clamshell with Resistance  - 1 x daily - 4-5 x weekly - 2 sets - 10 reps - Seated Long Arc Quad  - 1 x daily - 7 x weekly - 2 sets - 10 reps - Knee extension with Ankle Pumps  - 1 x daily - 7 x weekly - 2 sets - Clamshell  - 1 x daily - 5 x weekly - 2 sets - 10 reps  Updated HEP: - Side Stepping with Counter Support  - 1 x daily - 6 x weekly - 2 sets - 10 reps - Heel Raises with Counter Support  - 1 x daily - 7 x weekly - 2 sets - 10 reps - Standing Shoulder Row with Anchored Resistance  - 1 x daily - 4-5 x weekly - 2 sets - 10 reps - Shoulder extension with resistance - Neutral  - 1 x daily - 4-5 x weekly - 2 sets - 10 reps   ASSESSMENT:  CLINICAL IMPRESSION: Pt continues to become fatigued with activities having decreased tolerance to activity.  He continues to become very fatigued with Sunday School activities and reports no recent improvement with those activities.  Pt is improving with tolerance for exercises as evidenced by performance and progression of exercises.  Pt is fatigued with step activities though had improved form and tolerance with fwd step ups today.  Pt gives good effort with all exercises.  He responded well to Rx having no pain after Rx though reports soreness and fatigue.  Pt should benefit from cont skilled PT services to address impairments and goals and to improve pain and function.    OBJECTIVE IMPAIRMENTS: decreased activity tolerance, decreased balance,  decreased endurance, decreased mobility, difficulty walking, decreased ROM, decreased strength, and pain.   ACTIVITY LIMITATIONS: carrying, lifting, bending, standing, squatting, stairs, transfers, and locomotion level  PARTICIPATION LIMITATIONS: cleaning, community activity, and church  PERSONAL FACTORS: 3+ comorbidities: prior back surgeries, cervical fusion, CVA, DM Type, peripheral neuropathy  are also affecting patient's functional outcome.   REHAB POTENTIAL: Good  CLINICAL DECISION MAKING: Stable/uncomplicated  EVALUATION COMPLEXITY: Low   GOALS:   SHORT TERM GOALS: Target date: 08/28/2023   Pt will be independent and compliant with HEP for improved pain, strength, and function. Baseline: Goal status: PARTIALLY MET  2.  Pt will progress with core exercises per protocol without adverse effects for improved strength and performance of functional mobility. Baseline:  Goal status: GOAL MET  3.  Pt will demo correct form and be independent with log rolling. Baseline:  Goal status: GOAL MET  Target date:  08/14/2023  4.  Pt will demo increased gait speed with good stability.   Baseline:  Goal status:  ONGOING Target date:  09/04/2023  5.  Pt will report increased usage of R UE with daily activities.  Goal status:  INITIAL  Target date:   10/02/2023     LONG TERM GOALS: Target date: 12/03/2023  Pt will report he is able to perform transfers without increased pain.  Baseline:  Goal status: ONGOING  2.  Pt will report at least a 70% improvement in pain, sx's, and mobility overall  Baseline:  Goal status:  55% MET  3.  Pt will report improved tolerance with walking/duties at church on Sunday morning and be able to perform those duties without significant difficulty. Baseline:  Goal status: PROGRESSING  4.  Pt will demo improved core strength as evidenced by performance/progression of core exercises and improved bilat LE strength to 5/5 in hip flexion and knee  flexion/extension and a 5# increase in bilat hip abd for improved performance of and tolerance with functional mobility skills.  Baseline:  Goal status:  PARTIALLY MET  5.  Pt will demo good control on stairs and report increased ease with performing stairs. Baseline:  Goal status: ONGOING  6.  Pt will be able to perform self care activities with R UE including bathing and dressing without significant pain and difficulty.   Goal status:  INITIAL   7.  Pt will report at least a 70% improvement in performing reaching including reaching overhead.   Goal status:  ONGOING  8.  Pt will demo at least a 15-20 deg improvement in R shoulder flexion, scaption, and abd AROM for improved mobility and reaching.   Goal status:  33% MET   PLAN:  PT FREQUENCY: 2x/week  PT DURATION: 5-6 weeks  PLANNED INTERVENTIONS: Therapeutic exercises, Therapeutic activity, Neuromuscular re-education, Balance training, Gait training, Patient/Family education, Self Care, Joint mobilization, Stair training, Aquatic Therapy, Dry Needling, Electrical stimulation, Cryotherapy, Moist heat, scar mobilization, Taping, Manual therapy, and Re-evaluation.  PLAN FOR NEXT SESSION: Cont per lumbar fusion protocol.  Core strengthening per lumbar fusion protocol.  Cont with shoulder ROM and shoulder/scapular strengthening and stabilization.     Audie Clear III PT, DPT 11/07/23 8:42 AM

## 2023-11-07 ENCOUNTER — Encounter (HOSPITAL_BASED_OUTPATIENT_CLINIC_OR_DEPARTMENT_OTHER): Payer: Self-pay | Admitting: Physical Therapy

## 2023-11-08 ENCOUNTER — Ambulatory Visit (HOSPITAL_BASED_OUTPATIENT_CLINIC_OR_DEPARTMENT_OTHER): Payer: Medicare Other | Admitting: Physical Therapy

## 2023-11-08 DIAGNOSIS — M5459 Other low back pain: Secondary | ICD-10-CM

## 2023-11-08 DIAGNOSIS — M25611 Stiffness of right shoulder, not elsewhere classified: Secondary | ICD-10-CM

## 2023-11-08 DIAGNOSIS — G8929 Other chronic pain: Secondary | ICD-10-CM

## 2023-11-08 DIAGNOSIS — M6281 Muscle weakness (generalized): Secondary | ICD-10-CM

## 2023-11-08 NOTE — Therapy (Signed)
OUTPATIENT PHYSICAL THERAPY TREATMENT NOTE         Patient Name: James Barber MRN: [id number] DOB:June 04, 1946, 77 y.o., male Today's Date: 11/09/2023  END OF SESSION:  PT End of Session - 11/08/23 1114     Visit Number 22    Number of Visits 28    Date for PT Re-Evaluation 12/03/23    Authorization Type BCBS MCR    PT Start Time 1110   Pt arrived 10 mins late to treatment   PT Stop Time 1154    PT Time Calculation (min) 44 min    Activity Tolerance Patient tolerated treatment well    Behavior During Therapy WFL for tasks assessed/performed                          Past Medical History:  Diagnosis Date   Bowel obstruction (HCC)    Cancer (HCC)    prostate- "everything removed and clear"   Complication of anesthesia    Hard to wake and spikes fever   Diabetes mellitus without complication (HCC)    GERD (gastroesophageal reflux disease)    pt has not had issues with this in "a long time"   History of pulmonary embolus (PE)    Hypertension    Hypothyroidism    Ischemic stroke (HCC) 12/2022   Sleep apnea    Past Surgical History:  Procedure Laterality Date   ABDOMINAL SURGERY     "had ileostomy for about 4 months and had it reversed- due to salmonella infection- [place]   ANTERIOR CERVICAL DECOMP/DISCECTOMY FUSION  11/12/2012   Procedure: ANTERIOR CERVICAL DECOMPRESSION/DISCECTOMY FUSION 2 LEVELS;  Surgeon: Barnett Abu, MD;  Location: [facility];  Service: Neurosurgery;  Laterality: N/A;  Right Side approach Cervical six-seven,Cervical seven -thoracic one Anterior cervical decompression/diskectomy/fusion   BACK SURGERY  2023   x2 (outpatient with Dr. Danielle Dess)   CARPAL TUNNEL RELEASE Bilateral    CERVICAL FUSION     c 3-4, C 4-5, C5-6   HERNIA REPAIR     right inguinal   LAPAROTOMY N/A 03/22/2015   Procedure: DIAGONOSTIC LAPAROSCOPY, LAPROSCOPIC LYSIS OF ADHESIONS FOR ONE HOUR, DISTAL SMALL BOWEL RESECTION;  Surgeon: [name], MD;  Location: [facility];  Service: General;  Laterality: N/A;   Patient Active Problem List   Diagnosis Date Noted   Spondylolisthesis at L4-L5 level 05/07/2023   Acute pulmonary embolus (HCC) 04/19/2015   Rash and nonspecific skin eruption 04/19/2015   Diabetes mellitus, type II (HCC) 03/30/2015   Elevated LFTs 03/30/2015   Abnormal liver function tests 03/30/2015   Incisional hernia, without obstruction or gangrene 03/21/2015   Recurrent SBO (small bowel obstruction) 03/15/2015   HYPERLIPIDEMIA 03/09/2008   Iron deficiency anemia 03/09/2008   GERD 03/09/2008   Bilateral inguinal hernia (BIH), Left > Right 03/09/2008    REFERRING PROVIDER: Barnett Abu, MD REFERRING PROVIDER (shoulder):  Marlon Pel, MD    REFERRING DIAG: M54.16 (ICD-10-CM) - Radiculopathy, lumbar region     M75.41  Impingement syndrome of right shoulder  Rationale for Evaluation and Treatment: Rehabilitation  THERAPY DIAG:  Other low back pain  Muscle weakness (generalized)  Chronic right shoulder pain  Stiffness of right shoulder, not elsewhere classified  ONSET DATE:  DOS 05/07/2023   SUBJECTIVE:  SUBJECTIVE STATEMENT: Pt states he is hurting the same.  He denies any adverse effects after prior Rx.  Pt called wife and had wife talk to pt about MRI findings.  Pt's wife states he had 3 issues in his shoulder.  She states he has a RTC tear, a lot of arthritis, and a bone spur.  Pt's wife states pt is planning to have a reverse shoulder replacement.  PERTINENT HISTORY:  L4-S1 fusion on 05/07/2023.  Pt has B/L/T restrictions.   Pt has had 2 prior lumbar surgeries.  Hx of 5 cervical surgeries including cervical fusion C2-T1.  DM type 2, peripheral neuropathy in bilat feet  CVA 12/2022--R sided visual deficits, some  recall/processing deficits   Hx of PE in 2016 and 2021 HTN  PAIN:  No pain in sitting, but has pain with movement.  Pt denies R shoulder pain at rest, but has pain with activity.      PRECAUTIONS: Back and Other: Lumbar fusion, B/L/T restrictions, peripheral neuropathy, hx of cervical fusion, CVA    WEIGHT BEARING RESTRICTIONS: No  FALLS:  Has patient fallen in last 6 months? Yes. Number of falls 1  LIVING ENVIRONMENT: Lives with: lives with their family Lives in: 2 story home, but stays on 1st floor mainly Stairs: yes  OCCUPATION:  Pt is retired  PLOF: Independent  PATIENT GOALS: improve mobility, balance, strength, and tolerance with household chores.  To be able to perform woodworking.    OBJECTIVE:   DIAGNOSTIC FINDINGS:  Pt is post op.      TODAY'S TREATMENT:                                                                                                                                                Therapeutic Exercise:   Pt performed:               Nustep at L3 x 6 mins, just LE's  Standing on airex with FT x30 sec  Tandem gait with UE assist on rail  Heel raises with TrA 2x10   Lateral band walks with RTB above knees x 2 laps with UE support on rail  Supine manual HS stretch x 30 sec bilat and with passive AP's for nn flossing 2x20 reps bilat      Therapeutic Activities:   Step ups on 6 inch step with UE support x10 on R LE and 2x10 on L LE with TrA  Lateral step ups on 4 inch step with TrA with UE support x 10 reps bilat  Marching on airex 2x15 with UE support on rail     PATIENT EDUCATION:  Education details: dx, exercise form, relevant anatomy, HEP, POC, and rationale of interventions.  PT encouraged pt to be compliant with HEP. Person educated: Patient Education method: Explanation, Demonstration, Tactile cues, Verbal cues Education comprehension: verbalized understanding, returned demonstration,  verbal cues required, tactile cues required,  and needs further education  HOME EXERCISE PROGRAM: Access Code: [email or web address] URL: [email or web address] Date: 07/31/2023 Prepared by: Aaron Edelman  Exercises - Supine Transversus Abdominis Bracing - Hands on Stomach  - 2 x daily - 7 x weekly - 2 sets - 10 reps - Log Roll  - 1 x daily - 1 x weekly - 1 sets - 1 reps - Supine March  - 1 x daily - 7 x weekly - 2 sets - 10 reps - Hooklying Clamshell with Resistance  - 1 x daily - 4-5 x weekly - 2 sets - 10 reps - Seated Long Arc Quad  - 1 x daily - 7 x weekly - 2 sets - 10 reps - Knee extension with Ankle Pumps  - 1 x daily - 7 x weekly - 2 sets - Clamshell  - 1 x daily - 5 x weekly - 2 sets - 10 reps  Updated HEP: - Side Stepping with Counter Support  - 1 x daily - 6 x weekly - 2 sets - 10 reps - Heel Raises with Counter Support  - 1 x daily - 7 x weekly - 2 sets - 10 reps - Standing Shoulder Row with Anchored Resistance  - 1 x daily - 4-5 x weekly - 2 sets - 10 reps - Shoulder extension with resistance - Neutral  - 1 x daily - 4-5 x weekly - 2 sets - 10 reps   ASSESSMENT:  CLINICAL IMPRESSION: PT spoke to pt's wife over the phone about MRI shoulder MRI results.  Pt has arthritis, bone spur, and RTC tear.  PT deferred treatment of shoulder and focused on back, balance, and LE strengthening.  Pt had difficulty with 6 inch step ups on R LE though able to perform on L LE well.  Pt able to perform lateral step ups with rail well though is fatigued.  He requires UE support on rail with tandem gait.  Pt gives good effort with all exercises.  He responded well to Rx having no pain after Rx though was fatigued.  Pt should benefit from cont skilled PT services to address impairments and goals and to improve pain and function.    OBJECTIVE IMPAIRMENTS: decreased activity tolerance, decreased balance, decreased endurance, decreased mobility, difficulty walking, decreased ROM, decreased strength, and pain.   ACTIVITY LIMITATIONS:  carrying, lifting, bending, standing, squatting, stairs, transfers, and locomotion level  PARTICIPATION LIMITATIONS: cleaning, community activity, and church  PERSONAL FACTORS: 3+ comorbidities: prior back surgeries, cervical fusion, CVA, DM Type, peripheral neuropathy  are also affecting patient's functional outcome.   REHAB POTENTIAL: Good  CLINICAL DECISION MAKING: Stable/uncomplicated  EVALUATION COMPLEXITY: Low   GOALS:   SHORT TERM GOALS: Target date: 08/28/2023   Pt will be independent and compliant with HEP for improved pain, strength, and function. Baseline: Goal status: PARTIALLY MET  2.  Pt will progress with core exercises per protocol without adverse effects for improved strength and performance of functional mobility. Baseline:  Goal status: GOAL MET  3.  Pt will demo correct form and be independent with log rolling. Baseline:  Goal status: GOAL MET Target date:  08/14/2023  4.  Pt will demo increased gait speed with good stability.   Baseline:  Goal status:  ONGOING Target date:  09/04/2023  5.  Pt will report increased usage of R UE with daily activities.  Goal status:  INITIAL  Target date:   10/02/2023     LONG TERM  GOALS: Target date: 12/03/2023  Pt will report he is able to perform transfers without increased pain.  Baseline:  Goal status: ONGOING  2.  Pt will report at least a 70% improvement in pain, sx's, and mobility overall  Baseline:  Goal status:  55% MET  3.  Pt will report improved tolerance with walking/duties at church on Sunday morning and be able to perform those duties without significant difficulty. Baseline:  Goal status: PROGRESSING  4.  Pt will demo improved core strength as evidenced by performance/progression of core exercises and improved bilat LE strength to 5/5 in hip flexion and knee flexion/extension and a 5# increase in bilat hip abd for improved performance of and tolerance with functional mobility skills.   Baseline:  Goal status:  PARTIALLY MET  5.  Pt will demo good control on stairs and report increased ease with performing stairs. Baseline:  Goal status: ONGOING  6.  Pt will be able to perform self care activities with R UE including bathing and dressing without significant pain and difficulty.   Goal status:  INITIAL   7.  Pt will report at least a 70% improvement in performing reaching including reaching overhead.   Goal status:  ONGOING  8.  Pt will demo at least a 15-20 deg improvement in R shoulder flexion, scaption, and abd AROM for improved mobility and reaching.   Goal status:  33% MET   PLAN:  PT FREQUENCY: 2x/week  PT DURATION: 5-6 weeks  PLANNED INTERVENTIONS: Therapeutic exercises, Therapeutic activity, Neuromuscular re-education, Balance training, Gait training, Patient/Family education, Self Care, Joint mobilization, Stair training, Aquatic Therapy, Dry Needling, Electrical stimulation, Cryotherapy, Moist heat, scar mobilization, Taping, Manual therapy, and Re-evaluation.  PLAN FOR NEXT SESSION: Cont per lumbar fusion protocol.  Core strengthening per lumbar fusion protocol.  Pt is planning to have R shoulder surgery.  PT will defer treatment of R shoulder at this time except light ROM exercises per pt tolerance.      Audie Clear III PT, DPT 11/09/23 5:55 PM

## 2023-11-09 ENCOUNTER — Encounter (HOSPITAL_BASED_OUTPATIENT_CLINIC_OR_DEPARTMENT_OTHER): Payer: Self-pay | Admitting: Physical Therapy

## 2023-11-13 ENCOUNTER — Ambulatory Visit (HOSPITAL_BASED_OUTPATIENT_CLINIC_OR_DEPARTMENT_OTHER): Payer: Medicare Other | Admitting: Physical Therapy

## 2023-11-13 DIAGNOSIS — M6281 Muscle weakness (generalized): Secondary | ICD-10-CM

## 2023-11-13 DIAGNOSIS — M5459 Other low back pain: Secondary | ICD-10-CM

## 2023-11-13 NOTE — Therapy (Signed)
OUTPATIENT PHYSICAL THERAPY TREATMENT NOTE         Patient Name: James Barber MRN: [id number] DOB:[birth year], 77 y.o., male Today's Date: 11/14/2023  END OF SESSION:     PT End of Session - 11/13/23       Visit Number 23     Number of Visits 28     Date for PT Re-Evaluation 12/03/23     Authorization Type BCBS MCR     PT Start Time 1106   Pt arrived 5 mins late to treatment    PT Stop Time 1151     PT Time Calculation (min) 45 min     Activity Tolerance Patient tolerated treatment well     Behavior During Therapy WFL for tasks assessed/performed               Past Medical History:  Diagnosis Date   Bowel obstruction (HCC)    Cancer (HCC)    prostate- "everything removed and clear"   Complication of anesthesia    Hard to wake and spikes fever   Diabetes mellitus without complication (HCC)    GERD (gastroesophageal reflux disease)    pt has not had issues with this in "a long time"   History of pulmonary embolus (PE)    Hypertension    Hypothyroidism    Ischemic stroke (HCC) 12/2022   Sleep apnea    Past Surgical History:  Procedure Laterality Date   ABDOMINAL SURGERY     "had ileostomy for about 4 months and had it reversed- due to salmonella infection- this happened in [place]"   ANTERIOR CERVICAL DECOMP/DISCECTOMY FUSION  11/12/2012   Procedure: ANTERIOR CERVICAL DECOMPRESSION/DISCECTOMY FUSION 2 LEVELS;  Surgeon: Barnett Abu, MD;  Location: [facility];  Service: Neurosurgery;  Laterality: N/A;  Right Side approach Cervical six-seven,Cervical seven -thoracic one Anterior cervical decompression/diskectomy/fusion   BACK SURGERY  2023   x2 (outpatient with Dr. Danielle Dess)   CARPAL TUNNEL RELEASE Bilateral    CERVICAL FUSION     c 3-4, C 4-5, C5-6   HERNIA REPAIR     right inguinal   LAPAROTOMY N/A 03/22/2015   Procedure: DIAGONOSTIC LAPAROSCOPY, LAPROSCOPIC LYSIS OF ADHESIONS FOR ONE HOUR, DISTAL SMALL BOWEL RESECTION;  Surgeon: [name],  MD;  Location: [facility];  Service: General;  Laterality: N/A;   Patient Active Problem List   Diagnosis Date Noted   Spondylolisthesis at L4-L5 level 05/07/2023   Acute pulmonary embolus (HCC) 04/19/2015   Rash and nonspecific skin eruption 04/19/2015   Diabetes mellitus, type II (HCC) 03/30/2015   Elevated LFTs 03/30/2015   Abnormal liver function tests 03/30/2015   Incisional hernia, without obstruction or gangrene 03/21/2015   Recurrent SBO (small bowel obstruction) 03/15/2015   HYPERLIPIDEMIA 03/09/2008   Iron deficiency anemia 03/09/2008   GERD 03/09/2008   Bilateral inguinal hernia (BIH), Left > Right 03/09/2008    REFERRING PROVIDER: Barnett Abu, MD REFERRING PROVIDER (shoulder):  Marlon Pel, MD    REFERRING DIAG: M54.16 (ICD-10-CM) - Radiculopathy, lumbar region     M75.41  Impingement syndrome of right shoulder  Rationale for Evaluation and Treatment: Rehabilitation  THERAPY DIAG:  Other low back pain  Muscle weakness (generalized)  ONSET DATE:  DOS 05/07/2023   SUBJECTIVE:  SUBJECTIVE STATEMENT: Pt denies any adverse effects after prior Rx.  Pt did ok walking around church performing Sunday School activities though was worn out.  He reports he felt the same with Sunday School activities.  Pt reports he has weakness in post hips/glutes R > L.  Pt hasn't been performing HEP.       PERTINENT HISTORY:  L4-S1 fusion on 05/07/2023.  Pt has B/L/T restrictions.   Pt has had 2 prior lumbar surgeries.  MRI indicating a RTC tear, arthritis, and a bone spur per Pt's wife.  Pt is planning to have a R reverse shoulder replacement.  Hx of 5 cervical surgeries including cervical fusion C2-T1.  DM type 2, peripheral neuropathy in bilat feet  CVA 12/2022--R sided visual deficits, some  recall/processing deficits   Hx of PE in 2016 and 2021 HTN  PAIN:  Pt states he is not really having any lumbar pain currently. He has discomfort.   Pt denies R shoulder pain at rest, but has pain with activity.      PRECAUTIONS: Back and Other: Lumbar fusion, B/L/T restrictions, peripheral neuropathy, hx of cervical fusion, CVA    WEIGHT BEARING RESTRICTIONS: No  FALLS:  Has patient fallen in last 6 months? Yes. Number of falls 1  LIVING ENVIRONMENT: Lives with: lives with their family Lives in: 2 story home, but stays on 1st floor mainly Stairs: yes  OCCUPATION:  Pt is retired  PLOF: Independent  PATIENT GOALS: improve mobility, balance, strength, and tolerance with household chores.  To be able to perform woodworking.    OBJECTIVE:   DIAGNOSTIC FINDINGS:  Pt is post op.      TODAY'S TREATMENT:                                                                                                                                     Pt performed:               Nustep at L3 x 7 mins, just LE's  Standing on airex with FT 2x45 sec  Marching on airex with bilat UE support x 10 reps and x 8 reps  Tandem gait with UE assist on rail in the aerobics studio x 2 laps  Heel raises with TrA 2x12   Lateral band walks with RTB above knees x 2 laps with UE support on rail in aerobics studio  LAQ 2# 2x10 bilat   Lateral step ups on 4 inch step with TrA with UE support x 10 reps bilat   Supine manual HS stretch with passive AP's for nn flossing 2x30 sec bilat      PATIENT EDUCATION:  Education details: dx, exercise form, relevant anatomy, HEP, POC, and rationale of interventions.  PT encouraged pt to be compliant with HEP. Person educated: Patient Education method: Explanation, Demonstration, Tactile cues, Verbal cues Education comprehension: verbalized understanding, returned demonstration, verbal cues required, tactile cues required, and  needs further education  HOME EXERCISE  PROGRAM: Access Code: [email or web address] URL: [email or web address] Date: 07/31/2023 Prepared by: Aaron Edelman  Exercises - Supine Transversus Abdominis Bracing - Hands on Stomach  - 2 x daily - 7 x weekly - 2 sets - 10 reps - Log Roll  - 1 x daily - 1 x weekly - 1 sets - 1 reps - Supine March  - 1 x daily - 7 x weekly - 2 sets - 10 reps - Hooklying Clamshell with Resistance  - 1 x daily - 4-5 x weekly - 2 sets - 10 reps - Seated Long Arc Quad  - 1 x daily - 7 x weekly - 2 sets - 10 reps - Knee extension with Ankle Pumps  - 1 x daily - 7 x weekly - 2 sets - Clamshell  - 1 x daily - 5 x weekly - 2 sets - 10 reps - Side Stepping with Counter Support  - 1 x daily - 6 x weekly - 2 sets - 10 reps - Heel Raises with Counter Support  - 1 x daily - 7 x weekly - 2 sets - 10 reps - Standing Shoulder Row with Anchored Resistance  - 1 x daily - 4-5 x weekly - 2 sets - 10 reps - Shoulder extension with resistance - Neutral  - 1 x daily - 4-5 x weekly - 2 sets - 10 reps   ASSESSMENT:  CLINICAL IMPRESSION: PT deferred treatment of shoulder and focused on back, balance, and LE strengthening.  He requires UE support on rail with all standing exercises except standing on airex.  Pt is slow and tentative with gait.  He had weakness in R LE with lateral step ups.  He continues to have weakness in LE's and is making slow progress in strength.  Pt gives good effort with all exercises.  He responded well to Rx having no increased pain after Rx and stated "I feel worked out".  Pt should benefit from cont skilled PT services to address impairments and goals and to improve pain and function.    OBJECTIVE IMPAIRMENTS: decreased activity tolerance, decreased balance, decreased endurance, decreased mobility, difficulty walking, decreased ROM, decreased strength, and pain.   ACTIVITY LIMITATIONS: carrying, lifting, bending, standing, squatting, stairs, transfers, and locomotion level  PARTICIPATION LIMITATIONS:  cleaning, community activity, and church  PERSONAL FACTORS: 3+ comorbidities: prior back surgeries, cervical fusion, CVA, DM Type, peripheral neuropathy  are also affecting patient's functional outcome.   REHAB POTENTIAL: Good  CLINICAL DECISION MAKING: Stable/uncomplicated  EVALUATION COMPLEXITY: Low   GOALS:   SHORT TERM GOALS: Target date: 08/28/2023   Pt will be independent and compliant with HEP for improved pain, strength, and function. Baseline: Goal status: PARTIALLY MET  2.  Pt will progress with core exercises per protocol without adverse effects for improved strength and performance of functional mobility. Baseline:  Goal status: GOAL MET  3.  Pt will demo correct form and be independent with log rolling. Baseline:  Goal status: GOAL MET Target date:  08/14/2023  4.  Pt will demo increased gait speed with good stability.   Baseline:  Goal status:  ONGOING Target date:  09/04/2023  5.  Pt will report increased usage of R UE with daily activities.  Goal status:  INITIAL  Target date:   10/02/2023     LONG TERM GOALS: Target date: 12/03/2023  Pt will report he is able to perform transfers without increased pain.  Baseline:  Goal status: ONGOING  2.  Pt will report at least a 70% improvement in pain, sx's, and mobility overall  Baseline:  Goal status:  55% MET  3.  Pt will report improved tolerance with walking/duties at church on Sunday morning and be able to perform those duties without significant difficulty. Baseline:  Goal status: PROGRESSING  4.  Pt will demo improved core strength as evidenced by performance/progression of core exercises and improved bilat LE strength to 5/5 in hip flexion and knee flexion/extension and a 5# increase in bilat hip abd for improved performance of and tolerance with functional mobility skills.  Baseline:  Goal status:  PARTIALLY MET  5.  Pt will demo good control on stairs and report increased ease with performing  stairs. Baseline:  Goal status: ONGOING  6.  Pt will be able to perform self care activities with R UE including bathing and dressing without significant pain and difficulty.   Goal status:  INITIAL   7.  Pt will report at least a 70% improvement in performing reaching including reaching overhead.   Goal status:  ONGOING  8.  Pt will demo at least a 15-20 deg improvement in R shoulder flexion, scaption, and abd AROM for improved mobility and reaching.   Goal status:  33% MET   PLAN:  PT FREQUENCY: 2x/week  PT DURATION: 5-6 weeks  PLANNED INTERVENTIONS: Therapeutic exercises, Therapeutic activity, Neuromuscular re-education, Balance training, Gait training, Patient/Family education, Self Care, Joint mobilization, Stair training, Aquatic Therapy, Dry Needling, Electrical stimulation, Cryotherapy, Moist heat, scar mobilization, Taping, Manual therapy, and Re-evaluation.  PLAN FOR NEXT SESSION: Cont per lumbar fusion protocol.  Core strengthening per lumbar fusion protocol.  Pt is planning to have R shoulder surgery.  PT will defer treatment of R shoulder at this time except light ROM exercises per pt tolerance.      Audie Clear III PT, DPT 11/14/23 8:25 PM

## 2023-11-14 ENCOUNTER — Encounter (HOSPITAL_BASED_OUTPATIENT_CLINIC_OR_DEPARTMENT_OTHER): Payer: Self-pay | Admitting: Physical Therapy

## 2023-11-15 ENCOUNTER — Ambulatory Visit (HOSPITAL_BASED_OUTPATIENT_CLINIC_OR_DEPARTMENT_OTHER): Payer: Medicare Other | Admitting: Physical Therapy

## 2023-11-15 DIAGNOSIS — M5459 Other low back pain: Secondary | ICD-10-CM

## 2023-11-15 DIAGNOSIS — M6281 Muscle weakness (generalized): Secondary | ICD-10-CM

## 2023-11-15 NOTE — Therapy (Signed)
OUTPATIENT PHYSICAL THERAPY TREATMENT NOTE         Patient Name: James Barber MRN: [id number] DOB:1946/05/13, 77 y.o., male Today's Date: 11/16/2023  END OF SESSION:  PT End of Session - 11/15/23 1118     Visit Number 24    Number of Visits 28    Date for PT Re-Evaluation 12/03/23    Authorization Type [employer] MCR    PT Start Time 1112    PT Stop Time 1154    PT Time Calculation (min) 42 min    Activity Tolerance Patient tolerated treatment well    Behavior During Therapy WFL for tasks assessed/performed                     Past Medical History:  Diagnosis Date   Bowel obstruction (HCC)    Cancer (HCC)    prostate- "everything removed and clear"   Complication of anesthesia    Hard to wake and spikes fever   Diabetes mellitus without complication (HCC)    GERD (gastroesophageal reflux disease)    pt has not had issues with this in "a long time"   History of pulmonary embolus (PE)    Hypertension    Hypothyroidism    Ischemic stroke (HCC) 12/2022   Sleep apnea    Past Surgical History:  Procedure Laterality Date   ABDOMINAL SURGERY     "had ileostomy for about 4 months and had it reversed- due to salmonella infection- [place]   ANTERIOR CERVICAL DECOMP/DISCECTOMY FUSION  11/12/2012   Procedure: ANTERIOR CERVICAL DECOMPRESSION/DISCECTOMY FUSION 2 LEVELS;  Surgeon: Barnett Abu, MD;  Location: [facility];  Service: Neurosurgery;  Laterality: N/A;  Right Side approach Cervical six-seven,Cervical seven -thoracic one Anterior cervical decompression/diskectomy/fusion   BACK SURGERY  2023   x2 (outpatient with Dr. Danielle Dess)   CARPAL TUNNEL RELEASE Bilateral    CERVICAL FUSION     c 3-4, C 4-5, C5-6   HERNIA REPAIR     right inguinal   LAPAROTOMY N/A 03/22/2015   Procedure: DIAGONOSTIC LAPAROSCOPY, LAPROSCOPIC LYSIS OF ADHESIONS FOR ONE HOUR, DISTAL SMALL BOWEL RESECTION;  Surgeon: [name], MD;  Location: [facility];  Service: General;   Laterality: N/A;   Patient Active Problem List   Diagnosis Date Noted   Spondylolisthesis at L4-L5 level 05/07/2023   Acute pulmonary embolus (HCC) 04/19/2015   Rash and nonspecific skin eruption 04/19/2015   Diabetes mellitus, type II (HCC) 03/30/2015   Elevated LFTs 03/30/2015   Abnormal liver function tests 03/30/2015   Incisional hernia, without obstruction or gangrene 03/21/2015   Recurrent SBO (small bowel obstruction) 03/15/2015   HYPERLIPIDEMIA 03/09/2008   Iron deficiency anemia 03/09/2008   GERD 03/09/2008   Bilateral inguinal hernia (BIH), Left > Right 03/09/2008    REFERRING PROVIDER: Barnett Abu, MD REFERRING PROVIDER (shoulder):  Marlon Pel, MD    REFERRING DIAG: M54.16 (ICD-10-CM) - Radiculopathy, lumbar region     M75.41  Impingement syndrome of right shoulder  Rationale for Evaluation and Treatment: Rehabilitation  THERAPY DIAG:  Other low back pain  Muscle weakness (generalized)  ONSET DATE:  DOS 05/07/2023   SUBJECTIVE:  SUBJECTIVE STATEMENT: Pt denies any adverse effects after prior Rx.  Pt did ok walking around church performing Sunday School activities though was worn out.  He reports he felt the same with Sunday School activities.  Pt reports he has weakness in post hips/glutes R > L.  Pt hasn't been performing HEP, but did try the sidestepping at home.       PERTINENT HISTORY:  L4-S1 fusion on 05/07/2023.  Pt has B/L/T restrictions.   Pt has had 2 prior lumbar surgeries.  MRI indicating a RTC tear, arthritis, and a bone spur per Pt's wife.  Pt is planning to have a R reverse shoulder replacement.  Hx of 5 cervical surgeries including cervical fusion C2-T1.  DM type 2, peripheral neuropathy in bilat feet  CVA 12/2022--R sided visual deficits, some  recall/processing deficits   Hx of PE in 2016 and 2021 HTN  PAIN:  Pt states he is not really having any lumbar pain currently. He has discomfort.   Pt denies R shoulder pain at rest, but has pain with activity.      PRECAUTIONS: Back and Other: Lumbar fusion, B/L/T restrictions, peripheral neuropathy, hx of cervical fusion, CVA    WEIGHT BEARING RESTRICTIONS: No  FALLS:  Has patient fallen in last 6 months? Yes. Number of falls 1  LIVING ENVIRONMENT: Lives with: lives with their family Lives in: 2 story home, but stays on 1st floor mainly Stairs: yes  OCCUPATION:  Pt is retired  PLOF: Independent  PATIENT GOALS: improve mobility, balance, strength, and tolerance with household chores.  To be able to perform woodworking.    OBJECTIVE:   DIAGNOSTIC FINDINGS:  Pt is post op.      TODAY'S TREATMENT:                                                                                                                                     Pt performed:               Nustep at L3 x 7 mins, just LE's  Standing on airex with FT x45 sec  Staggered stance x 30 sec each on airex  Marching on airex with bilat UE support x 10 reps and x 8 reps  Tandem gait with UE assist on rail in the aerobics studio x 2 laps (length of the rail)   Heel raises with TrA approx 20 reps  Lateral band walks with RTB above knees x 1 lap (length of the rail) with UE support on rail in aerobics studio  LAQ 2# 2x10 bilat   Step ups on 6 inch step x10 bilat  Lateral step ups on 4 inch step with TrA with UE support x 10 reps bilat   Supine manual HS stretch with passive AP's for nn flossing 2x30 sec bilat  Supine manual HS stretch x30 sec bilat  Seated HS stretch x 20-30 sec bilat  PATIENT EDUCATION:  Education details: dx, exercise form, relevant anatomy, HEP, POC, and rationale of interventions.  PT encouraged pt to be compliant with HEP. Person educated: Patient Education method: Explanation,  Demonstration, Tactile cues, Verbal cues Education comprehension: verbalized understanding, returned demonstration, verbal cues required, tactile cues required, and needs further education  HOME EXERCISE PROGRAM: Access Code: [email or web address] URL: [email or web address] Date: 07/31/2023 Prepared by: Aaron Edelman  Exercises - Supine Transversus Abdominis Bracing - Hands on Stomach  - 2 x daily - 7 x weekly - 2 sets - 10 reps - Log Roll  - 1 x daily - 1 x weekly - 1 sets - 1 reps - Supine March  - 1 x daily - 7 x weekly - 2 sets - 10 reps - Hooklying Clamshell with Resistance  - 1 x daily - 4-5 x weekly - 2 sets - 10 reps - Seated Long Arc Quad  - 1 x daily - 7 x weekly - 2 sets - 10 reps - Knee extension with Ankle Pumps  - 1 x daily - 7 x weekly - 2 sets - Clamshell  - 1 x daily - 5 x weekly - 2 sets - 10 reps - Side Stepping with Counter Support  - 1 x daily - 6 x weekly - 2 sets - 10 reps - Heel Raises with Counter Support  - 1 x daily - 7 x weekly - 2 sets - 10 reps - Standing Shoulder Row with Anchored Resistance  - 1 x daily - 4-5 x weekly - 2 sets - 10 reps - Shoulder extension with resistance - Neutral  - 1 x daily - 4-5 x weekly - 2 sets - 10 reps   ASSESSMENT:  CLINICAL IMPRESSION: PT deferred treatment of shoulder and focused on back, balance, and LE strengthening.  He requires UE support on rail with all standing exercises except standing on airex.  Pt is slow and tentative with gait.  Pt has more difficulty on R LE with step ups than L LE.  He continues to have weakness in LE's and is making slow progress in strength.  Pt gives good effort with all exercises.  Pt has tightness in bilat HS, L > R.  He responded well to Rx having no increased pain and was fatigued Rx.  Pt should benefit from cont skilled PT services to address impairments and goals and to improve pain and function.    OBJECTIVE IMPAIRMENTS: decreased activity tolerance, decreased balance, decreased  endurance, decreased mobility, difficulty walking, decreased ROM, decreased strength, and pain.   ACTIVITY LIMITATIONS: carrying, lifting, bending, standing, squatting, stairs, transfers, and locomotion level  PARTICIPATION LIMITATIONS: cleaning, community activity, and church  PERSONAL FACTORS: 3+ comorbidities: prior back surgeries, cervical fusion, CVA, DM Type, peripheral neuropathy  are also affecting patient's functional outcome.   REHAB POTENTIAL: Good  CLINICAL DECISION MAKING: Stable/uncomplicated  EVALUATION COMPLEXITY: Low   GOALS:   SHORT TERM GOALS: Target date: 08/28/2023   Pt will be independent and compliant with HEP for improved pain, strength, and function. Baseline: Goal status: PARTIALLY MET  2.  Pt will progress with core exercises per protocol without adverse effects for improved strength and performance of functional mobility. Baseline:  Goal status: GOAL MET  3.  Pt will demo correct form and be independent with log rolling. Baseline:  Goal status: GOAL MET Target date:  08/14/2023  4.  Pt will demo increased gait speed with good stability.   Baseline:  Goal status:  ONGOING Target  date:  09/04/2023  5.  Pt will report increased usage of R UE with daily activities.  Goal status:  INITIAL  Target date:   10/02/2023     LONG TERM GOALS: Target date: 12/03/2023  Pt will report he is able to perform transfers without increased pain.  Baseline:  Goal status: ONGOING  2.  Pt will report at least a 70% improvement in pain, sx's, and mobility overall  Baseline:  Goal status:  55% MET  3.  Pt will report improved tolerance with walking/duties at church on Sunday morning and be able to perform those duties without significant difficulty. Baseline:  Goal status: PROGRESSING  4.  Pt will demo improved core strength as evidenced by performance/progression of core exercises and improved bilat LE strength to 5/5 in hip flexion and knee  flexion/extension and a 5# increase in bilat hip abd for improved performance of and tolerance with functional mobility skills.  Baseline:  Goal status:  PARTIALLY MET  5.  Pt will demo good control on stairs and report increased ease with performing stairs. Baseline:  Goal status: ONGOING  6.  Pt will be able to perform self care activities with R UE including bathing and dressing without significant pain and difficulty.   Goal status:  INITIAL   7.  Pt will report at least a 70% improvement in performing reaching including reaching overhead.   Goal status:  ONGOING  8.  Pt will demo at least a 15-20 deg improvement in R shoulder flexion, scaption, and abd AROM for improved mobility and reaching.   Goal status:  33% MET   PLAN:  PT FREQUENCY: 2x/week  PT DURATION: 5-6 weeks  PLANNED INTERVENTIONS: Therapeutic exercises, Therapeutic activity, Neuromuscular re-education, Balance training, Gait training, Patient/Family education, Self Care, Joint mobilization, Stair training, Aquatic Therapy, Dry Needling, Electrical stimulation, Cryotherapy, Moist heat, scar mobilization, Taping, Manual therapy, and Re-evaluation.  PLAN FOR NEXT SESSION: Cont per lumbar fusion protocol.  Core strengthening per lumbar fusion protocol.  Pt is planning to have R shoulder surgery.  PT will defer treatment of R shoulder at this time except light ROM exercises per pt tolerance.      Audie Clear III PT, DPT 11/16/23 3:03 PM

## 2023-11-16 ENCOUNTER — Encounter (HOSPITAL_BASED_OUTPATIENT_CLINIC_OR_DEPARTMENT_OTHER): Payer: Self-pay | Admitting: Physical Therapy

## 2023-11-20 ENCOUNTER — Ambulatory Visit (HOSPITAL_BASED_OUTPATIENT_CLINIC_OR_DEPARTMENT_OTHER): Payer: Medicare Other | Admitting: Physical Therapy

## 2023-11-20 DIAGNOSIS — M6281 Muscle weakness (generalized): Secondary | ICD-10-CM

## 2023-11-20 DIAGNOSIS — M5459 Other low back pain: Secondary | ICD-10-CM | POA: Diagnosis not present

## 2023-11-20 NOTE — Therapy (Signed)
OUTPATIENT PHYSICAL THERAPY TREATMENT NOTE         Patient Name: James Barber MRN: [id number] DOB:October 26, 1946, 77 y.o., male Today's Date: 11/21/2023  END OF SESSION:  PT End of Session - 11/20/23 1115     Visit Number 25    Number of Visits 28    Date for PT Re-Evaluation 12/03/23    Authorization Type BCBS MCR    PT Start Time 1110    PT Stop Time 1152    PT Time Calculation (min) 42 min    Activity Tolerance Patient tolerated treatment well    Behavior During Therapy WFL for tasks assessed/performed                     Past Medical History:  Diagnosis Date   Bowel obstruction (HCC)    Cancer (HCC)    prostate- "everything removed and clear"   Complication of anesthesia    Hard to wake and spikes fever   Diabetes mellitus without complication (HCC)    GERD (gastroesophageal reflux disease)    pt has not had issues with this in "a long time"   History of pulmonary embolus (PE)    Hypertension    Hypothyroidism    Ischemic stroke (HCC) 12/2022   Sleep apnea    Past Surgical History:  Procedure Laterality Date   ABDOMINAL SURGERY     "had ileostomy for about 4 months and had it reversed- due to salmonella infection- [place]   ANTERIOR CERVICAL DECOMP/DISCECTOMY FUSION  11/12/2012   Procedure: ANTERIOR CERVICAL DECOMPRESSION/DISCECTOMY FUSION 2 LEVELS;  Surgeon: Barnett Abu, MD;  Location: [facility];  Service: Neurosurgery;  Laterality: N/A;  Right Side approach Cervical six-seven,Cervical seven -thoracic one Anterior cervical decompression/diskectomy/fusion   BACK SURGERY  2023   x2 (outpatient with Dr. Danielle Dess)   CARPAL TUNNEL RELEASE Bilateral    CERVICAL FUSION     c 3-4, C 4-5, C5-6   HERNIA REPAIR     right inguinal   LAPAROTOMY N/A 03/22/2015   Procedure: DIAGONOSTIC LAPAROSCOPY, LAPROSCOPIC LYSIS OF ADHESIONS FOR ONE HOUR, DISTAL SMALL BOWEL RESECTION;  Surgeon: [name], MD;  Location: [facility];  Service: General;   Laterality: N/A;   Patient Active Problem List   Diagnosis Date Noted   Spondylolisthesis at L4-L5 level 05/07/2023   Acute pulmonary embolus (HCC) 04/19/2015   Rash and nonspecific skin eruption 04/19/2015   Diabetes mellitus, type II (HCC) 03/30/2015   Elevated LFTs 03/30/2015   Abnormal liver function tests 03/30/2015   Incisional hernia, without obstruction or gangrene 03/21/2015   Recurrent SBO (small bowel obstruction) 03/15/2015   HYPERLIPIDEMIA 03/09/2008   Iron deficiency anemia 03/09/2008   GERD 03/09/2008   Bilateral inguinal hernia (BIH), Left > Right 03/09/2008    REFERRING PROVIDER: Barnett Abu, MD REFERRING PROVIDER (shoulder):  Marlon Pel, MD    REFERRING DIAG: M54.16 (ICD-10-CM) - Radiculopathy, lumbar region     M75.41  Impingement syndrome of right shoulder  Rationale for Evaluation and Treatment: Rehabilitation  THERAPY DIAG:  Other low back pain  Muscle weakness (generalized)  ONSET DATE:  DOS 05/07/2023   SUBJECTIVE:  SUBJECTIVE STATEMENT: Pt denies any adverse effects after prior Rx.  Pt reports he is fatigued with PT treatment and requires a day to "recoup".  Pt continues to have significant fatigue with walking around church to perform Sunday School activities.       PERTINENT HISTORY:  L4-S1 fusion on 05/07/2023.  Pt has B/L/T restrictions.   Pt has had 2 prior lumbar surgeries.  MRI indicating a RTC tear, arthritis, and a bone spur per Pt's wife.  Pt is planning to have a R reverse shoulder replacement.  Hx of 5 cervical surgeries including cervical fusion C2-T1.  DM type 2, peripheral neuropathy in bilat feet  CVA 12/2022--R sided visual deficits, some recall/processing deficits   Hx of PE in 2016 and 2021 HTN  PAIN:  Pt states he is not really having  any lumbar pain currently. He has discomfort.   Pt denies R shoulder pain at rest, but has pain with activity.      PRECAUTIONS: Back and Other: Lumbar fusion, B/L/T restrictions, peripheral neuropathy, hx of cervical fusion, CVA    WEIGHT BEARING RESTRICTIONS: No  FALLS:  Has patient fallen in last 6 months? Yes. Number of falls 1  LIVING ENVIRONMENT: Lives with: lives with their family Lives in: 2 story home, but stays on 1st floor mainly Stairs: yes  OCCUPATION:  Pt is retired  PLOF: Independent  PATIENT GOALS: improve mobility, balance, strength, and tolerance with household chores.  To be able to perform woodworking.    OBJECTIVE:   DIAGNOSTIC FINDINGS:  Pt is post op.      TODAY'S TREATMENT:                                                                                                                                     Pt performed:               Nustep at L4 x 5 mins, just LE's  Marching on airex with bilat UE support 2 x 10 reps   Tandem gait with UE assist on rail in the aerobics studio x 3 laps (length of the rail)   Heel raises with TrA approx 20 reps  Lateral band walks with RTB above knees x 2 laps (length of the rail) with UE support on rail in aerobics studio  Lateral step ups on 4 inch step 2x10      Supine manual HS stretch with passive AP's for nn flossing 2x30 sec bilat  Supine manual HS stretch x30 sec bilat        PATIENT EDUCATION:  Education details: dx, exercise form, relevant anatomy, HEP, POC, and rationale of interventions.  PT encouraged pt to be compliant with HEP. Person educated: Patient Education method: Explanation, Demonstration, Tactile cues, Verbal cues Education comprehension: verbalized understanding, returned demonstration, verbal cues required, tactile cues required, and needs further education  HOME EXERCISE PROGRAM: Access Code: [email or web address] URL: [email or web address] Date: 07/31/2023  Prepared by: Aaron Edelman  Exercises - Supine Transversus Abdominis Bracing - Hands on Stomach  - 2 x daily - 7 x weekly - 2 sets - 10 reps - Log Roll  - 1 x daily - 1 x weekly - 1 sets - 1 reps - Supine March  - 1 x daily - 7 x weekly - 2 sets - 10 reps - Hooklying Clamshell with Resistance  - 1 x daily - 4-5 x weekly - 2 sets - 10 reps - Seated Long Arc Quad  - 1 x daily - 7 x weekly - 2 sets - 10 reps - Knee extension with Ankle Pumps  - 1 x daily - 7 x weekly - 2 sets - Clamshell  - 1 x daily - 5 x weekly - 2 sets - 10 reps - Side Stepping with Counter Support  - 1 x daily - 6 x weekly - 2 sets - 10 reps - Heel Raises with Counter Support  - 1 x daily - 7 x weekly - 2 sets - 10 reps - Standing Shoulder Row with Anchored Resistance  - 1 x daily - 4-5 x weekly - 2 sets - 10 reps - Shoulder extension with resistance - Neutral  - 1 x daily - 4-5 x weekly - 2 sets - 10 reps   ASSESSMENT:  CLINICAL IMPRESSION: Pt continues to be slow and tentative with gait.  He does require UE support with tandem gait though is improving.  He uses lighter UE support on rail with tandem gait.  Pt has more difficulty on R LE with step ups than L LE.  He continues to have weakness in LE's and is making slow progress in strength.  Pt gives good effort with all exercises.  Pt has tightness in bilat HS, L > R.  He responded well to Rx stating he could feel a workout for his legs though had no pain after Rx.  Pt should benefit from cont skilled PT services to address impairments and goals and to improve pain and function.     OBJECTIVE IMPAIRMENTS: decreased activity tolerance, decreased balance, decreased endurance, decreased mobility, difficulty walking, decreased ROM, decreased strength, and pain.   ACTIVITY LIMITATIONS: carrying, lifting, bending, standing, squatting, stairs, transfers, and locomotion level  PARTICIPATION LIMITATIONS: cleaning, community activity, and church  PERSONAL FACTORS: 3+ comorbidities: prior back  surgeries, cervical fusion, CVA, DM Type, peripheral neuropathy  are also affecting patient's functional outcome.   REHAB POTENTIAL: Good  CLINICAL DECISION MAKING: Stable/uncomplicated  EVALUATION COMPLEXITY: Low   GOALS:   SHORT TERM GOALS: Target date: 08/28/2023   Pt will be independent and compliant with HEP for improved pain, strength, and function. Baseline: Goal status: PARTIALLY MET  2.  Pt will progress with core exercises per protocol without adverse effects for improved strength and performance of functional mobility. Baseline:  Goal status: GOAL MET  3.  Pt will demo correct form and be independent with log rolling. Baseline:  Goal status: GOAL MET Target date:  08/14/2023  4.  Pt will demo increased gait speed with good stability.   Baseline:  Goal status:  ONGOING Target date:  09/04/2023  5.  Pt will report increased usage of R UE with daily activities.  Goal status:  INITIAL  Target date:   10/02/2023     LONG TERM GOALS: Target date: 12/03/2023  Pt will report he is able to perform transfers without increased pain.  Baseline:  Goal status: ONGOING  2.  Pt will report at least a 70% improvement in pain, sx's, and mobility overall  Baseline:  Goal status:  55% MET  3.  Pt will report improved tolerance with walking/duties at church on Sunday morning and be able to perform those duties without significant difficulty. Baseline:  Goal status: PROGRESSING  4.  Pt will demo improved core strength as evidenced by performance/progression of core exercises and improved bilat LE strength to 5/5 in hip flexion and knee flexion/extension and a 5# increase in bilat hip abd for improved performance of and tolerance with functional mobility skills.  Baseline:  Goal status:  PARTIALLY MET  5.  Pt will demo good control on stairs and report increased ease with performing stairs. Baseline:  Goal status: ONGOING  6.  Pt will be able to perform self care  activities with R UE including bathing and dressing without significant pain and difficulty.   Goal status:  INITIAL   7.  Pt will report at least a 70% improvement in performing reaching including reaching overhead.   Goal status:  ONGOING  8.  Pt will demo at least a 15-20 deg improvement in R shoulder flexion, scaption, and abd AROM for improved mobility and reaching.   Goal status:  33% MET   PLAN:  PT FREQUENCY: 2x/week  PT DURATION: 5-6 weeks  PLANNED INTERVENTIONS: Therapeutic exercises, Therapeutic activity, Neuromuscular re-education, Balance training, Gait training, Patient/Family education, Self Care, Joint mobilization, Stair training, Aquatic Therapy, Dry Needling, Electrical stimulation, Cryotherapy, Moist heat, scar mobilization, Taping, Manual therapy, and Re-evaluation.  PLAN FOR NEXT SESSION: Cont per lumbar fusion protocol.  Core strengthening per lumbar fusion protocol.  Pt is planning to have R shoulder surgery.  PT will defer treatment of R shoulder at this time except light ROM exercises per pt tolerance.      Audie Clear III PT, DPT 11/21/23 2:47 PM                        OUTPATIENT PHYSICAL THERAPY TREATMENT NOTE         Patient Name: James Barber MRN: [id number] DOB:June 28, 1946, 77 y.o., male Today's Date: 11/21/2023  END OF SESSION:  PT End of Session - 11/20/23 1115     Visit Number 25    Number of Visits 28    Date for PT Re-Evaluation 12/03/23    Authorization Type BCBS MCR    PT Start Time 1110    PT Stop Time 1152    PT Time Calculation (min) 42 min    Activity Tolerance Patient tolerated treatment well    Behavior During Therapy WFL for tasks assessed/performed                     Past Medical History:  Diagnosis Date   Bowel obstruction (HCC)    Cancer (HCC)    prostate- "everything removed and clear"   Complication of anesthesia    Hard to wake and spikes fever   Diabetes mellitus  without complication (HCC)    GERD (gastroesophageal reflux disease)    pt has not had issues with this in "a long time"   History of pulmonary embolus (PE)    Hypertension    Hypothyroidism    Ischemic stroke (HCC) 12/2022   Sleep apnea    Past Surgical History:  Procedure Laterality Date   ABDOMINAL SURGERY     "had ileostomy for about 4 months and had it reversed- due to salmonella infection-  this happened in [place]"   ANTERIOR CERVICAL DECOMP/DISCECTOMY FUSION  11/12/2012   Procedure: ANTERIOR CERVICAL DECOMPRESSION/DISCECTOMY FUSION 2 LEVELS;  Surgeon: Barnett Abu, MD;  Location: [facility];  Service: Neurosurgery;  Laterality: N/A;  Right Side approach Cervical six-seven,Cervical seven -thoracic one Anterior cervical decompression/diskectomy/fusion   BACK SURGERY  2023   x2 (outpatient with Dr. Danielle Dess)   CARPAL TUNNEL RELEASE Bilateral    CERVICAL FUSION     c 3-4, C 4-5, C5-6   HERNIA REPAIR     right inguinal   LAPAROTOMY N/A 03/22/2015   Procedure: DIAGONOSTIC LAPAROSCOPY, LAPROSCOPIC LYSIS OF ADHESIONS FOR ONE HOUR, DISTAL SMALL BOWEL RESECTION;  Surgeon: [name], MD;  Location: [facility];  Service: General;  Laterality: N/A;   Patient Active Problem List   Diagnosis Date Noted   Spondylolisthesis at L4-L5 level 05/07/2023   Acute pulmonary embolus (HCC) 04/19/2015   Rash and nonspecific skin eruption 04/19/2015   Diabetes mellitus, type II (HCC) 03/30/2015   Elevated LFTs 03/30/2015   Abnormal liver function tests 03/30/2015   Incisional hernia, without obstruction or gangrene 03/21/2015   Recurrent SBO (small bowel obstruction) 03/15/2015   HYPERLIPIDEMIA 03/09/2008   Iron deficiency anemia 03/09/2008   GERD 03/09/2008   Bilateral inguinal hernia (BIH), Left > Right 03/09/2008    REFERRING PROVIDER: Barnett Abu, MD REFERRING PROVIDER (shoulder):  Marlon Pel, MD    REFERRING DIAG: M54.16 (ICD-10-CM) - Radiculopathy, lumbar region     M75.41   Impingement syndrome of right shoulder  Rationale for Evaluation and Treatment: Rehabilitation  THERAPY DIAG:  Other low back pain  Muscle weakness (generalized)  ONSET DATE:  DOS 05/07/2023   SUBJECTIVE:                                                                                                                                                                                           SUBJECTIVE STATEMENT: Pt states he is feeling pretty good today.  Pt denies any adverse effects after prior Rx.  Pt had shoulder pain when taking his jacket off.    Pt did ok walking around church performing Sunday School activities though was worn out.  He reports he felt the same with Sunday School activities.  Pt reports he has weakness in post hips/glutes R > L.  Pt hasn't been performing HEP, but did try the sidestepping at home.       PERTINENT HISTORY:  L4-S1 fusion on 05/07/2023.  Pt has B/L/T restrictions.   Pt has had 2 prior lumbar surgeries.  MRI indicating a RTC tear, arthritis, and a bone spur per Pt's wife.  Pt is  planning to have a R reverse shoulder replacement.  Hx of 5 cervical surgeries including cervical fusion C2-T1.  DM type 2, peripheral neuropathy in bilat feet  CVA 12/2022--R sided visual deficits, some recall/processing deficits   Hx of PE in 2016 and 2021 HTN  PAIN:  Pt states he is not really having much lumbar pain currently.    Pt denies R shoulder pain at rest, but has pain with activity.      PRECAUTIONS: Back and Other: Lumbar fusion, B/L/T restrictions, peripheral neuropathy, hx of cervical fusion, CVA    WEIGHT BEARING RESTRICTIONS: No  FALLS:  Has patient fallen in last 6 months? Yes. Number of falls 1  LIVING ENVIRONMENT: Lives with: lives with their family Lives in: 2 story home, but stays on 1st floor mainly Stairs: yes  OCCUPATION:  Pt is retired  PLOF: Independent  PATIENT GOALS: improve mobility, balance, strength, and tolerance with  household chores.  To be able to perform woodworking.    OBJECTIVE:   DIAGNOSTIC FINDINGS:  Pt is post op.      TODAY'S TREATMENT:                                                                                                                                     Pt performed:               Nustep at L4 x 5 mins, just LE's  Standing on airex with FT x45 sec  Staggered stance x 30 sec each on airex  Marching on airex with bilat UE support x 10 reps and x 8 reps  Tandem gait with UE assist on rail in the aerobics studio x 2 laps (length of the rail)   Heel raises with TrA approx 20 reps  Lateral band walks with RTB above knees x 1 lap (length of the rail) with UE support on rail in aerobics studio  LAQ 2# 2x10 bilat   Step ups on 6 inch step x10 bilat  Lateral step ups on 4 inch step with TrA with UE support x 10 reps bilat   Supine manual HS stretch with passive AP's for nn flossing 2x30 sec bilat  Supine manual HS stretch x30 sec bilat  Seated HS stretch x 20-30 sec bilat      PATIENT EDUCATION:  Education details: dx, exercise form, relevant anatomy, HEP, POC, and rationale of interventions.  PT encouraged pt to be compliant with HEP. Person educated: Patient Education method: Explanation, Demonstration, Tactile cues, Verbal cues Education comprehension: verbalized understanding, returned demonstration, verbal cues required, tactile cues required, and needs further education  HOME EXERCISE PROGRAM: Access Code: [email or web address] URL: [email or web address] Date: 07/31/2023 Prepared by: Aaron Edelman  Exercises - Supine Transversus Abdominis Bracing - Hands on Stomach  - 2 x daily - 7 x weekly - 2 sets - 10 reps - Log Roll  - 1 x daily - 1 x weekly -  1 sets - 1 reps - Supine March  - 1 x daily - 7 x weekly - 2 sets - 10 reps - Hooklying Clamshell with Resistance  - 1 x daily - 4-5 x weekly - 2 sets - 10 reps - Seated Long Arc Quad  - 1 x daily - 7 x weekly - 2  sets - 10 reps - Knee extension with Ankle Pumps  - 1 x daily - 7 x weekly - 2 sets - Clamshell  - 1 x daily - 5 x weekly - 2 sets - 10 reps - Side Stepping with Counter Support  - 1 x daily - 6 x weekly - 2 sets - 10 reps - Heel Raises with Counter Support  - 1 x daily - 7 x weekly - 2 sets - 10 reps - Standing Shoulder Row with Anchored Resistance  - 1 x daily - 4-5 x weekly - 2 sets - 10 reps - Shoulder extension with resistance - Neutral  - 1 x daily - 4-5 x weekly - 2 sets - 10 reps   ASSESSMENT:  CLINICAL IMPRESSION: PT deferred treatment of shoulder and focused on back, balance, and LE strengthening.  He requires UE support on rail with all standing exercises except standing on airex.  Pt is slow and tentative with gait.  Pt has more difficulty on R LE with step ups than L LE.  He continues to have weakness in LE's and is making slow progress in strength.  Pt gives good effort with all exercises.  Pt has tightness in bilat HS, L > R.  He responded well to Rx having no increased pain and was fatigued Rx.  Pt should benefit from cont skilled PT services to address impairments and goals and to improve pain and function.    OBJECTIVE IMPAIRMENTS: decreased activity tolerance, decreased balance, decreased endurance, decreased mobility, difficulty walking, decreased ROM, decreased strength, and pain.   ACTIVITY LIMITATIONS: carrying, lifting, bending, standing, squatting, stairs, transfers, and locomotion level  PARTICIPATION LIMITATIONS: cleaning, community activity, and church  PERSONAL FACTORS: 3+ comorbidities: prior back surgeries, cervical fusion, CVA, DM Type, peripheral neuropathy  are also affecting patient's functional outcome.   REHAB POTENTIAL: Good  CLINICAL DECISION MAKING: Stable/uncomplicated  EVALUATION COMPLEXITY: Low   GOALS:   SHORT TERM GOALS: Target date: 08/28/2023   Pt will be independent and compliant with HEP for improved pain, strength, and  function. Baseline: Goal status: PARTIALLY MET  2.  Pt will progress with core exercises per protocol without adverse effects for improved strength and performance of functional mobility. Baseline:  Goal status: GOAL MET  3.  Pt will demo correct form and be independent with log rolling. Baseline:  Goal status: GOAL MET Target date:  08/14/2023  4.  Pt will demo increased gait speed with good stability.   Baseline:  Goal status:  ONGOING Target date:  09/04/2023  5.  Pt will report increased usage of R UE with daily activities.  Goal status:  INITIAL  Target date:   10/02/2023     LONG TERM GOALS: Target date: 12/03/2023  Pt will report he is able to perform transfers without increased pain.  Baseline:  Goal status: ONGOING  2.  Pt will report at least a 70% improvement in pain, sx's, and mobility overall  Baseline:  Goal status:  55% MET  3.  Pt will report improved tolerance with walking/duties at church on Sunday morning and be able to perform those duties without significant  difficulty. Baseline:  Goal status: PROGRESSING  4.  Pt will demo improved core strength as evidenced by performance/progression of core exercises and improved bilat LE strength to 5/5 in hip flexion and knee flexion/extension and a 5# increase in bilat hip abd for improved performance of and tolerance with functional mobility skills.  Baseline:  Goal status:  PARTIALLY MET  5.  Pt will demo good control on stairs and report increased ease with performing stairs. Baseline:  Goal status: ONGOING  6.  Pt will be able to perform self care activities with R UE including bathing and dressing without significant pain and difficulty.   Goal status:  INITIAL   7.  Pt will report at least a 70% improvement in performing reaching including reaching overhead.   Goal status:  ONGOING  8.  Pt will demo at least a 15-20 deg improvement in R shoulder flexion, scaption, and abd AROM for improved mobility  and reaching.   Goal status:  33% MET   PLAN:  PT FREQUENCY: 2x/week  PT DURATION: 5-6 weeks  PLANNED INTERVENTIONS: Therapeutic exercises, Therapeutic activity, Neuromuscular re-education, Balance training, Gait training, Patient/Family education, Self Care, Joint mobilization, Stair training, Aquatic Therapy, Dry Needling, Electrical stimulation, Cryotherapy, Moist heat, scar mobilization, Taping, Manual therapy, and Re-evaluation.  PLAN FOR NEXT SESSION: Cont per lumbar fusion protocol.  Core strengthening per lumbar fusion protocol.  Pt is planning to have R shoulder surgery.  PT will defer treatment of R shoulder at this time except light ROM exercises per pt tolerance.      Audie Clear III PT, DPT 11/21/23 2:27 PM

## 2023-11-21 ENCOUNTER — Encounter (HOSPITAL_BASED_OUTPATIENT_CLINIC_OR_DEPARTMENT_OTHER): Payer: Self-pay | Admitting: Physical Therapy

## 2023-11-22 ENCOUNTER — Ambulatory Visit (HOSPITAL_BASED_OUTPATIENT_CLINIC_OR_DEPARTMENT_OTHER): Payer: Medicare Other | Admitting: Physical Therapy

## 2023-11-22 ENCOUNTER — Encounter (HOSPITAL_BASED_OUTPATIENT_CLINIC_OR_DEPARTMENT_OTHER): Payer: Self-pay | Admitting: Physical Therapy

## 2023-11-22 DIAGNOSIS — M5459 Other low back pain: Secondary | ICD-10-CM

## 2023-11-22 DIAGNOSIS — M6281 Muscle weakness (generalized): Secondary | ICD-10-CM

## 2023-11-22 NOTE — Therapy (Signed)
OUTPATIENT PHYSICAL THERAPY TREATMENT NOTE         Patient Name: James Barber MRN: [id number] DOB:03-07-1946, 77 y.o., male Today's Date: 11/22/2023  END OF SESSION:  PT End of Session - 11/22/23 1102     Visit Number 26    Number of Visits 28    Date for PT Re-Evaluation 12/03/23    Authorization Type BCBS MCR    PT Start Time 1100    PT Stop Time 1142    PT Time Calculation (min) 42 min    Activity Tolerance Patient tolerated treatment well    Behavior During Therapy WFL for tasks assessed/performed                     Past Medical History:  Diagnosis Date   Bowel obstruction (HCC)    Cancer (HCC)    prostate- "everything removed and clear"   Complication of anesthesia    Hard to wake and spikes fever   Diabetes mellitus without complication (HCC)    GERD (gastroesophageal reflux disease)    pt has not had issues with this in "a long time"   History of pulmonary embolus (PE)    Hypertension    Hypothyroidism    Ischemic stroke (HCC) 12/2022   Sleep apnea    Past Surgical History:  Procedure Laterality Date   ABDOMINAL SURGERY     "had ileostomy for about 4 months and had it reversed- due to salmonella infection- [place]   ANTERIOR CERVICAL DECOMP/DISCECTOMY FUSION  11/12/2012   Procedure: ANTERIOR CERVICAL DECOMPRESSION/DISCECTOMY FUSION 2 LEVELS;  Surgeon: Barnett Abu, MD;  Location: [facility];  Service: Neurosurgery;  Laterality: N/A;  Right Side approach Cervical six-seven,Cervical seven -thoracic one Anterior cervical decompression/diskectomy/fusion   BACK SURGERY  2023   x2 (outpatient with Dr. Danielle Dess)   CARPAL TUNNEL RELEASE Bilateral    CERVICAL FUSION     c 3-4, C 4-5, C5-6   HERNIA REPAIR     right inguinal   LAPAROTOMY N/A 03/22/2015   Procedure: DIAGONOSTIC LAPAROSCOPY, LAPROSCOPIC LYSIS OF ADHESIONS FOR ONE HOUR, DISTAL SMALL BOWEL RESECTION;  Surgeon: [name], MD;  Location: [facility];  Service: General;   Laterality: N/A;   Patient Active Problem List   Diagnosis Date Noted   Spondylolisthesis at L4-L5 level 05/07/2023   Acute pulmonary embolus (HCC) 04/19/2015   Rash and nonspecific skin eruption 04/19/2015   Diabetes mellitus, type II (HCC) 03/30/2015   Elevated LFTs 03/30/2015   Abnormal liver function tests 03/30/2015   Incisional hernia, without obstruction or gangrene 03/21/2015   Recurrent SBO (small bowel obstruction) 03/15/2015   HYPERLIPIDEMIA 03/09/2008   Iron deficiency anemia 03/09/2008   GERD 03/09/2008   Bilateral inguinal hernia (BIH), Left > Right 03/09/2008    REFERRING PROVIDER: Barnett Abu, MD REFERRING PROVIDER (shoulder):  Marlon Pel, MD    REFERRING DIAG: M54.16 (ICD-10-CM) - Radiculopathy, lumbar region     M75.41  Impingement syndrome of right shoulder  Rationale for Evaluation and Treatment: Rehabilitation  THERAPY DIAG:  Other low back pain  Muscle weakness (generalized)  ONSET DATE:  DOS 05/07/2023   SUBJECTIVE:  SUBJECTIVE STATEMENT: Pt denies any adverse effects after prior Rx.   Pt states yesterday was "challenging".  Pt was up/down the steps multiple times performing laundry yesterday which was challenging.  He reports improved performance of stairs including improved stability.  Pt states his shoulder was aching earlier this AM.  Pt states he has some testing coming up to be cleared for shoulder surgery.        PERTINENT HISTORY:  L4-S1 fusion on 05/07/2023.  Pt has B/L/T restrictions.   Pt has had 2 prior lumbar surgeries.  MRI indicating a RTC tear, arthritis, and a bone spur per Pt's wife.  Pt is planning to have a R reverse shoulder replacement.  Hx of 5 cervical surgeries including cervical fusion C2-T1.  DM type 2, peripheral neuropathy in bilat  feet  CVA 12/2022--R sided visual deficits, some recall/processing deficits   Hx of PE in 2016 and 2021 HTN  PAIN:  4/10 central and bilat flank lumbar pain  Pt denies R shoulder pain at rest, but has pain with activity.      PRECAUTIONS: Back and Other: Lumbar fusion, B/L/T restrictions, peripheral neuropathy, hx of cervical fusion, CVA    WEIGHT BEARING RESTRICTIONS: No  FALLS:  Has patient fallen in last 6 months? Yes. Number of falls 1  LIVING ENVIRONMENT: Lives with: lives with their family Lives in: 2 story home, but stays on 1st floor mainly Stairs: yes  OCCUPATION:  Pt is retired  PLOF: Independent  PATIENT GOALS: improve mobility, balance, strength, and tolerance with household chores.  To be able to perform woodworking.    OBJECTIVE:   DIAGNOSTIC FINDINGS:  Pt is post op.      TODAY'S TREATMENT:                                                                                                                                     Pt performed:               Nustep at L4 x 5 mins, just LE's  Standing with FT on airex x 1 min  Staggered standing on airex 2 x 30 sec bilat  Marching on airex with bilat UE support 2 x 10 reps   Tandem gait with UE assist on rail in the aerobics studio x 3 laps (length of the rail)   Lateral band walks with GTB above knees x 2 laps (length of the rail) with UE support on rail in aerobics studio  LAQ 2# approx 12 reps, 3# x 10 bilat     Supine manual HS stretch with passive AP's for nn flossing 2x30 sec bilat  Supine manual HS stretch x30 sec bilat      PATIENT EDUCATION:  Education details: dx, exercise form, relevant anatomy, HEP, POC, and rationale of interventions.  PT encouraged pt to be compliant with HEP. Person educated: Patient Education method: Explanation, Demonstration, Tactile cues, Verbal cues  Education comprehension: verbalized understanding, returned demonstration, verbal cues required, tactile cues  required, and needs further education  HOME EXERCISE PROGRAM: Access Code: [email or web address] URL: [email or web address] Date: 07/31/2023 Prepared by: Aaron Edelman  Exercises - Supine Transversus Abdominis Bracing - Hands on Stomach  - 2 x daily - 7 x weekly - 2 sets - 10 reps - Log Roll  - 1 x daily - 1 x weekly - 1 sets - 1 reps - Supine March  - 1 x daily - 7 x weekly - 2 sets - 10 reps - Hooklying Clamshell with Resistance  - 1 x daily - 4-5 x weekly - 2 sets - 10 reps - Seated Long Arc Quad  - 1 x daily - 7 x weekly - 2 sets - 10 reps - Knee extension with Ankle Pumps  - 1 x daily - 7 x weekly - 2 sets - Clamshell  - 1 x daily - 5 x weekly - 2 sets - 10 reps - Side Stepping with Counter Support  - 1 x daily - 6 x weekly - 2 sets - 10 reps - Heel Raises with Counter Support  - 1 x daily - 7 x weekly - 2 sets - 10 reps - Standing Shoulder Row with Anchored Resistance  - 1 x daily - 4-5 x weekly - 2 sets - 10 reps - Shoulder extension with resistance - Neutral  - 1 x daily - 4-5 x weekly - 2 sets - 10 reps   ASSESSMENT:  CLINICAL IMPRESSION: Pt continues to have fatigue with daily activities though reports improved performance and stability with stairs while doing laundry.  Pt continues to be slow and tentative with gait.  He does require UE support with tandem gait though is improving.  He uses lighter UE support on rail with tandem gait.  Pt was able to perform static balance activities on the airex well without LOB without UE support.  He requires UE support with marching on airex though had improved tolerance with marching today.  Pt gives good effort with all exercises.  PT increased resistance with lateral band walks today.  Pt has tightness in bilat HS, L > R.  He responded well to Rx having no increased pain though was fatigued after Rx.  Pt should benefit from cont skilled PT services to address impairments and goals and to improve pain and function.     OBJECTIVE  IMPAIRMENTS: decreased activity tolerance, decreased balance, decreased endurance, decreased mobility, difficulty walking, decreased ROM, decreased strength, and pain.   ACTIVITY LIMITATIONS: carrying, lifting, bending, standing, squatting, stairs, transfers, and locomotion level  PARTICIPATION LIMITATIONS: cleaning, community activity, and church  PERSONAL FACTORS: 3+ comorbidities: prior back surgeries, cervical fusion, CVA, DM Type, peripheral neuropathy  are also affecting patient's functional outcome.   REHAB POTENTIAL: Good  CLINICAL DECISION MAKING: Stable/uncomplicated  EVALUATION COMPLEXITY: Low   GOALS:   SHORT TERM GOALS: Target date: 08/28/2023   Pt will be independent and compliant with HEP for improved pain, strength, and function. Baseline: Goal status: PARTIALLY MET  2.  Pt will progress with core exercises per protocol without adverse effects for improved strength and performance of functional mobility. Baseline:  Goal status: GOAL MET  3.  Pt will demo correct form and be independent with log rolling. Baseline:  Goal status: GOAL MET Target date:  08/14/2023  4.  Pt will demo increased gait speed with good stability.   Baseline:  Goal status:  ONGOING Target date:  09/04/2023  5.  Pt will report increased usage of R UE with daily activities.  Goal status:  INITIAL  Target date:   10/02/2023     LONG TERM GOALS: Target date: 12/03/2023  Pt will report he is able to perform transfers without increased pain.  Baseline:  Goal status: ONGOING  2.  Pt will report at least a 70% improvement in pain, sx's, and mobility overall  Baseline:  Goal status:  55% MET  3.  Pt will report improved tolerance with walking/duties at church on Sunday morning and be able to perform those duties without significant difficulty. Baseline:  Goal status: PROGRESSING  4.  Pt will demo improved core strength as evidenced by performance/progression of core exercises and  improved bilat LE strength to 5/5 in hip flexion and knee flexion/extension and a 5# increase in bilat hip abd for improved performance of and tolerance with functional mobility skills.  Baseline:  Goal status:  PARTIALLY MET  5.  Pt will demo good control on stairs and report increased ease with performing stairs. Baseline:  Goal status: ONGOING  6.  Pt will be able to perform self care activities with R UE including bathing and dressing without significant pain and difficulty.   Goal status:  INITIAL   7.  Pt will report at least a 70% improvement in performing reaching including reaching overhead.   Goal status:  ONGOING  8.  Pt will demo at least a 15-20 deg improvement in R shoulder flexion, scaption, and abd AROM for improved mobility and reaching.   Goal status:  33% MET   PLAN:  PT FREQUENCY: 2x/week  PT DURATION: 5-6 weeks  PLANNED INTERVENTIONS: Therapeutic exercises, Therapeutic activity, Neuromuscular re-education, Balance training, Gait training, Patient/Family education, Self Care, Joint mobilization, Stair training, Aquatic Therapy, Dry Needling, Electrical stimulation, Cryotherapy, Moist heat, scar mobilization, Taping, Manual therapy, and Re-evaluation.  PLAN FOR NEXT SESSION: Cont per lumbar fusion protocol.  Core strengthening per lumbar fusion protocol.  Pt is planning to have R shoulder surgery.  PT will defer treatment of R shoulder at this time except light ROM exercises per pt tolerance.  Pt to return to PT after the holidays.        Audie Clear III PT, DPT 11/22/23 11:11 PM

## 2023-12-12 ENCOUNTER — Ambulatory Visit (HOSPITAL_BASED_OUTPATIENT_CLINIC_OR_DEPARTMENT_OTHER): Payer: Medicare Other | Attending: Neurological Surgery | Admitting: Physical Therapy

## 2023-12-12 ENCOUNTER — Encounter (HOSPITAL_BASED_OUTPATIENT_CLINIC_OR_DEPARTMENT_OTHER): Payer: Self-pay | Admitting: Physical Therapy

## 2023-12-12 DIAGNOSIS — M5459 Other low back pain: Secondary | ICD-10-CM | POA: Diagnosis present

## 2023-12-12 DIAGNOSIS — M6281 Muscle weakness (generalized): Secondary | ICD-10-CM | POA: Diagnosis present

## 2023-12-12 NOTE — Therapy (Signed)
 OUTPATIENT PHYSICAL THERAPY TREATMENT NOTE  Progress Note Reporting Period 10/29/23 to 12/12/2023  See note below for Objective Data and Assessment of Progress/Goals.             Patient Name: James Barber MRN: [id number] DOB:May 07, 1946, 78 y.o., male Today's Date: 12/13/2023  END OF SESSION:  PT End of Session - 12/12/23 1202     Visit Number 27    Number of Visits 35    Date for PT Re-Evaluation 01/09/24    Authorization Type BCBS MCR    PT Start Time 1158    PT Stop Time 1248    PT Time Calculation (min) 50 min    Activity Tolerance Patient tolerated treatment well    Behavior During Therapy WFL for tasks assessed/performed                     Past Medical History:  Diagnosis Date   Bowel obstruction (HCC)    Cancer (HCC)    prostate- everything removed and clear   Complication of anesthesia    Hard to wake and spikes fever   Diabetes mellitus without complication (HCC)    GERD (gastroesophageal reflux disease)    pt has not had issues with this in a long time   History of pulmonary embolus (PE)    Hypertension    Hypothyroidism    Ischemic stroke (HCC) 12/2022   Sleep apnea    Past Surgical History:  Procedure Laterality Date   ABDOMINAL SURGERY     had ileostomy for about 4 months and had it reversed- due to salmonella infection- this happened in [place]    ANTERIOR CERVICAL DECOMP/DISCECTOMY FUSION  11/12/2012   Procedure: ANTERIOR CERVICAL DECOMPRESSION/DISCECTOMY FUSION 2 LEVELS;  Surgeon: Victory Gens, MD;  Location: [facility];  Service: Neurosurgery;  Laterality: N/A;  Right Side approach Cervical six-seven,Cervical seven -thoracic one Anterior cervical decompression/diskectomy/fusion   BACK SURGERY  2023   x2 (outpatient with Dr. Gens)   CARPAL TUNNEL RELEASE Bilateral    CERVICAL FUSION     c 3-4, C 4-5, C5-6   HERNIA REPAIR     right inguinal   LAPAROTOMY N/A 03/22/2015   Procedure: DIAGONOSTIC LAPAROSCOPY,  LAPROSCOPIC LYSIS OF ADHESIONS FOR ONE HOUR, DISTAL SMALL BOWEL RESECTION;  Surgeon: Camellia Blush, MD;  Location: [facility];  Service: General;  Laterality: N/A;   Patient Active Problem List   Diagnosis Date Noted   Spondylolisthesis at L4-L5 level 05/07/2023   Acute pulmonary embolus (HCC) 04/19/2015   Rash and nonspecific skin eruption 04/19/2015   Diabetes mellitus, type II (HCC) 03/30/2015   Elevated LFTs 03/30/2015   Abnormal liver function tests 03/30/2015   Incisional hernia, without obstruction or gangrene 03/21/2015   Recurrent SBO (small bowel obstruction) 03/15/2015   HYPERLIPIDEMIA 03/09/2008   Iron deficiency anemia 03/09/2008   GERD 03/09/2008   Bilateral inguinal hernia (BIH), Left > Right 03/09/2008    REFERRING PROVIDER: Gens Victory, MD   REFERRING DIAG: M54.16 (ICD-10-CM) - Radiculopathy, lumbar region     M75.41  Impingement syndrome of right shoulder  Rationale for Evaluation and Treatment: Rehabilitation  THERAPY DIAG:  Other low back pain  Muscle weakness (generalized)  ONSET DATE:  DOS 05/07/2023   SUBJECTIVE:  SUBJECTIVE STATEMENT: Pt states his back has been hurting.  Pt had to put up a lot of Christmas decorations which may have increased his pain.  He had to make multiple trips up/down stairs putting away Christmas decorations.   Pt states he is able to get along better.  Pt states he can walk better.  At times, walking is easier and other times is difficult.  Pt states he is able to sleep good.  Pt states his pain is about the same.  There are times when the pain is not as bad.  Pt has increased pain with increased activity.  Pt reports a continued 40% improvement in pain, sx's, and mobility overall (didn't change from prior PN).  There are times when I can tell  improvement.  Pt states he doesn't have the improvement at this time due to his increased pain.  Pt hasn't been performing HEP  Pt has improved performance of stairs.      PERTINENT HISTORY:  L4-S1 fusion on 05/07/2023.  Pt has B/L/T restrictions.   Pt has had 2 prior lumbar surgeries.  MRI indicating a RTC tear, arthritis, and a bone spur per Pt's wife.  Pt is planning to have a R reverse shoulder replacement.  Hx of 5 cervical surgeries including cervical fusion C2-T1.  DM type 2, peripheral neuropathy in bilat feet  CVA 12/2022--R sided visual deficits, some recall/processing deficits   Hx of PE in 2016 and 2021 HTN  PAIN:  6-7/10 current, 7/10 worst, 3/10 best central and bilat flank lumbar pain     PRECAUTIONS: Back and Other: Lumbar fusion, B/L/T restrictions, peripheral neuropathy, hx of cervical fusion, CVA    WEIGHT BEARING RESTRICTIONS: No  FALLS:  Has patient fallen in last 6 months? Yes. Number of falls 1  LIVING ENVIRONMENT: Lives with: lives with their family Lives in: 2 story home, but stays on 1st floor mainly Stairs: yes  OCCUPATION:  Pt is retired  PLOF: Independent  PATIENT GOALS: improve mobility, balance, strength, and tolerance with household chores.  To be able to perform woodworking.    OBJECTIVE:   DIAGNOSTIC FINDINGS:  Pt is post op.      TODAY'S TREATMENT:                                                                                                                                  LOWER EXTREMITY MMT:     MMT Right eval Left eval Right 10/11 Left 10/11 Right 11/20 Left 11/20 Right 1/8 Left 1/8  Hip flexion     5/5 4/5   4+/5 5/5 4+/5  Hip extension                Hip abduction     23.8 22.3 29.4 28.2 29.8 27.4  Hip adduction                Hip internal rotation  Hip external rotation             4/5 3+/5  Knee flexion 4/5 seated 4/5 seated 5/5 seated 4+/5 seated 5/5 seated 5/5 seated 5/5 4+/5  Knee  extension 4/5 4+/5 5/5 5/5 5/5 5/5 5/5 4+/5  Ankle dorsiflexion 5/5 5/5            Ankle plantarflexion WFL seated WFL seated            Ankle inversion                Ankle eversion                 (Blank rows = not tested)     Gait:  Pt is slow with gait.  He had no LOB, but occasionally reached out for the wall.  Pt able to increase gait speed without LOB.  Stairs:  Pt ascended and descended stairs with a reciprocal gait with rail.    FOTO:  Initial/prior/Current:  50 / 56 / 53.  Goal of 55.      Pt performed:               Nustep at L4 x 5 mins, just LE's  Supine clams with GTB 2x10  S/L clams 2x10 bilat  LAQ 2# approx 12 reps, 3# x 10 bilat        PATIENT EDUCATION:  Education details: dx, exercise form, relevant anatomy, HEP, POC, and rationale of interventions.  PT encouraged pt to be compliant with HEP. Person educated: Patient Education method: Explanation, Demonstration, Tactile cues, Verbal cues Education comprehension: verbalized understanding, returned demonstration, verbal cues required, tactile cues required, and needs further education  HOME EXERCISE PROGRAM: Access Code: [email or web address] URL: [email or web address] Date: 07/31/2023 Prepared by: Mose Minerva  Exercises - Supine Transversus Abdominis Bracing - Hands on Stomach  - 2 x daily - 7 x weekly - 2 sets - 10 reps - Log Roll  - 1 x daily - 1 x weekly - 1 sets - 1 reps - Supine March  - 1 x daily - 7 x weekly - 2 sets - 10 reps - Hooklying Clamshell with Resistance  - 1 x daily - 4-5 x weekly - 2 sets - 10 reps - Seated Long Arc Quad  - 1 x daily - 7 x weekly - 2 sets - 10 reps - Knee extension with Ankle Pumps  - 1 x daily - 7 x weekly - 2 sets - Clamshell  - 1 x daily - 5 x weekly - 2 sets - 10 reps - Side Stepping with Counter Support  - 1 x daily - 6 x weekly - 2 sets - 10 reps - Heel Raises with Counter Support  - 1 x daily - 7 x weekly - 2 sets - 10 reps - Standing Shoulder Row with  Anchored Resistance  - 1 x daily - 4-5 x weekly - 2 sets - 10 reps - Shoulder extension with resistance - Neutral  - 1 x daily - 4-5 x weekly - 2 sets - 10 reps   ASSESSMENT:  CLINICAL IMPRESSION: Pt is 7 months post op.  Pt continues to have limited tolerance to activity and functional endurance.  He continues to become very fatigued   Pt is improving with mobility and states he can walk better.  Though his walking is easier at times, he continues to have difficulty with walking.  Pt states his pain is about the same and has increased  pain with increased activity.  Pt hasn't been performing HEP and PT encouraged pt to improve compliance with HEP.  Pt has improved performance of stairs and performs a reciprocal gait with the rail.  He continues to be slow with gait and reaches out for external objects at times, but was able to increase speed without LOB.  PT assessed LE strength.  He had decreased strength in L hip abd, knee ext, and flexion today.  Pt has weakness in bilat hip ER.  PT has worked on balance and pt is challenged with balance activities.  PT is not addressing R shoulder; Pt is planning on having shoulder surgery.  PT removed R shoulder goals due to not treating R shoulder.  Pt had no change in goals except meeting LTG #5.  Pt should benefit from cont skilled PT services to improve core and LE strength, gait, balance, functional mobility, tolerance to activity, and pain and to assist in restoring desired level of function.       OBJECTIVE IMPAIRMENTS: decreased activity tolerance, decreased balance, decreased endurance, decreased mobility, difficulty walking, decreased ROM, decreased strength, and pain.   ACTIVITY LIMITATIONS: carrying, lifting, bending, standing, squatting, stairs, transfers, and locomotion level  PARTICIPATION LIMITATIONS: cleaning, community activity, and church  PERSONAL FACTORS: 3+ comorbidities: prior back surgeries, cervical fusion, CVA, DM Type, peripheral  neuropathy  are also affecting patient's functional outcome.   REHAB POTENTIAL: Good  CLINICAL DECISION MAKING: Stable/uncomplicated  EVALUATION COMPLEXITY: Low   GOALS:   SHORT TERM GOALS: Target date: 08/28/2023   Pt will be independent and compliant with HEP for improved pain, strength, and function. Baseline: Goal status: PARTIALLY MET  2.  Pt will progress with core exercises per protocol without adverse effects for improved strength and performance of functional mobility. Baseline:  Goal status: GOAL MET  3.  Pt will demo correct form and be independent with log rolling. Baseline:  Goal status: GOAL MET Target date:  08/14/2023  4.  Pt will demo increased gait speed with good stability.   Baseline:  Goal status:  ONGOING Target date:  09/04/2023      LONG TERM GOALS: Target date: 01/09/2024  Pt will report he is able to perform transfers without increased pain.  Baseline:  Goal status: ONGOING  2.  Pt will report at least a 70% improvement in pain, sx's, and mobility overall  Baseline:  Goal status:  55% MET  (no change)  1/8  3.  Pt will report improved tolerance with walking/duties at church on Sunday morning and be able to perform those duties without significant difficulty. Baseline:  Goal status: ONGOING  4.  Pt will demo improved core strength as evidenced by performance/progression of core exercises and improved bilat LE strength to 5/5 in hip flexion and knee flexion/extension and a 5# increase in bilat hip abd for improved performance of and tolerance with functional mobility skills.  Baseline:  Goal status:  PARTIALLY MET  5.  Pt will demo good control on stairs and report increased ease with performing stairs. Baseline:  Goal status: GOAL MET  12/12/2023     PLAN:  PT FREQUENCY: 2x/week  PT DURATION: 5-6 weeks  PLANNED INTERVENTIONS: Therapeutic exercises, Therapeutic activity, Neuromuscular re-education, Balance training, Gait training,  Patient/Family education, Self Care, Joint mobilization, Stair training, Aquatic Therapy, Dry Needling, Electrical stimulation, Cryotherapy, Moist heat, scar mobilization, Taping, Manual therapy, and Re-evaluation.  PLAN FOR NEXT SESSION: Cont per lumbar fusion protocol.  Core strengthening per lumbar fusion protocol.  Pt is planning to have R shoulder surgery.  PT will defer treatment of R shoulder at this time except light ROM exercises per pt tolerance.    Leigh Minerva III PT, DPT 12/13/23 5:02 PM

## 2023-12-17 ENCOUNTER — Ambulatory Visit (HOSPITAL_BASED_OUTPATIENT_CLINIC_OR_DEPARTMENT_OTHER): Payer: Medicare Other | Admitting: Physical Therapy

## 2023-12-20 ENCOUNTER — Other Ambulatory Visit: Payer: Self-pay

## 2023-12-20 ENCOUNTER — Encounter (HOSPITAL_COMMUNITY)
Admission: RE | Admit: 2023-12-20 | Discharge: 2023-12-20 | Disposition: A | Discharge status: Home / Self Care | Payer: Medicare Other | Source: Ambulatory Visit | Attending: Gastroenterology | Admitting: Gastroenterology

## 2023-12-20 ENCOUNTER — Encounter (HOSPITAL_COMMUNITY): Payer: Self-pay

## 2023-12-20 VITALS — BP 116/63 | HR 68 | Temp 97.5°F | Ht 72.0 in | Wt 173.0 lb

## 2023-12-20 DIAGNOSIS — Z01818 Encounter for other preprocedural examination: Secondary | ICD-10-CM | POA: Insufficient documentation

## 2023-12-20 DIAGNOSIS — E119 Type 2 diabetes mellitus without complications: Secondary | ICD-10-CM | POA: Insufficient documentation

## 2023-12-20 HISTORY — DX: Unspecified osteoarthritis, unspecified site: M19.90

## 2023-12-20 LAB — SURGICAL PCR SCREEN
MRSA, PCR: NEGATIVE
Staphylococcus aureus: NEGATIVE

## 2023-12-20 LAB — CBC
HCT: 44.6 % (ref 39.0–52.0)
Hemoglobin: 14.4 g/dL (ref 13.0–17.0)
MCH: 32.6 pg (ref 26.0–34.0)
MCHC: 32.3 g/dL (ref 30.0–36.0)
MCV: 100.9 fL — ABNORMAL HIGH (ref 80.0–100.0)
Platelets: 239 10*3/uL (ref 150–400)
RBC: 4.42 MIL/uL (ref 4.22–5.81)
RDW: 13.8 % (ref 11.5–15.5)
WBC: 5.7 10*3/uL (ref 4.0–10.5)
nRBC: 0 % (ref 0.0–0.2)

## 2023-12-20 LAB — BASIC METABOLIC PANEL
Anion gap: 7 (ref 5–15)
BUN: 11 mg/dL (ref 8–23)
CO2: 25 mmol/L (ref 22–32)
Calcium: 9 mg/dL (ref 8.9–10.3)
Chloride: 108 mmol/L (ref 98–111)
Creatinine, Ser: 1 mg/dL (ref 0.61–1.24)
GFR, Estimated: >60 mL/min (ref >60)
Glucose, Bld: 143 mg/dL — ABNORMAL HIGH (ref 70–99)
Potassium: 4.1 mmol/L (ref 3.5–5.1)
Sodium: 140 mmol/L (ref 135–145)

## 2023-12-20 LAB — GLUCOSE, CAPILLARY: Glucose-Capillary: 130 mg/dL — ABNORMAL HIGH (ref 70–99)

## 2023-12-20 NOTE — Progress Notes (Signed)
For Anesthesia: PCP - Chyrl Civatte: PA. LOV: 11/26/23: CEW: [facility]  Cardiologist - Leron Croak: CEW Neurologist: Lorelee Cover, MD : CEW Bowel Prep reminder:  Chest x-ray -  EKG - 05/07/23: EPIC; 11/26/23: CEW Stress Test -  ECHO - 09/08/15 Cardiac Cath -  Pacemaker/ICD device last checked: Pacemaker orders received: Device Rep notified:  Spinal Cord Stimulator:  Sleep Study - Yes CPAP - Yes  Fasting Blood Sugar - N/A Checks Blood Sugar __0___ times a day Date and result of last Hgb A1c-6.3: 11/26/23: CEW  Last dose of GLP1 agonist- N/A GLP1 instructions:   Last dose of SGLT-2 inhibitors- N/A SGLT-2 instructions:   Blood Thinner Instructions: Xarelto will be on hold after: 12/23/23 Aspirin Instructions: Last Dose:  Activity level: Can go up a flight of stairs and activities of daily living without stopping and without chest pain and/or shortness of breath   Able to exercise without chest pain and/or shortness of breath  Anesthesia review: Hx: HTN,DIA,Stroke,PE,OSA(CPAP)  Patient denies shortness of breath, fever, cough and chest pain at PAT appointment   Patient verbalized understanding of instructions that were given to them at the PAT appointment. Patient was also instructed that they will need to review over the PAT instructions again at home before surgery.

## 2023-12-21 ENCOUNTER — Ambulatory Visit (HOSPITAL_BASED_OUTPATIENT_CLINIC_OR_DEPARTMENT_OTHER): Payer: Medicare Other | Admitting: Physical Therapy

## 2023-12-21 ENCOUNTER — Encounter (HOSPITAL_BASED_OUTPATIENT_CLINIC_OR_DEPARTMENT_OTHER): Payer: Self-pay | Admitting: Physical Therapy

## 2023-12-21 DIAGNOSIS — M5459 Other low back pain: Secondary | ICD-10-CM | POA: Diagnosis not present

## 2023-12-21 DIAGNOSIS — M6281 Muscle weakness (generalized): Secondary | ICD-10-CM

## 2023-12-21 NOTE — Therapy (Signed)
OUTPATIENT PHYSICAL THERAPY TREATMENT NOTE          Patient Name: James Barber MRN: [id number] DOB:12/29/1945, 78 y.o., male Today's Date: 12/21/2023  END OF SESSION:  PT End of Session - 12/21/23 1048     Visit Number 28    Number of Visits 28    Authorization Type BCBS MCR    PT Start Time 1023    PT Stop Time 1106    PT Time Calculation (min) 43 min    Activity Tolerance Patient tolerated treatment well    Behavior During Therapy WFL for tasks assessed/performed                     Past Medical History:  Diagnosis Date   Arthritis    Bowel obstruction (HCC)    Cancer (HCC)    prostate- "everything removed and clear"   Complication of anesthesia    Hard to wake and spikes fever   Diabetes mellitus without complication (HCC)    GERD (gastroesophageal reflux disease)    pt has not had issues with this in "a long time"   History of pulmonary embolus (PE)    Hypertension    Hypothyroidism    Ischemic stroke (HCC) 12/2022   Sleep apnea    Past Surgical History:  Procedure Laterality Date   ABDOMINAL SURGERY     "had ileostomy for about 4 months and had it reversed- due to salmonella infection- [place]   ANTERIOR CERVICAL DECOMP/DISCECTOMY FUSION  11/12/2012   Procedure: ANTERIOR CERVICAL DECOMPRESSION/DISCECTOMY FUSION 2 LEVELS;  Surgeon: Barnett Abu, MD;  Location: [facility];  Service: Neurosurgery;  Laterality: N/A;  Right Side approach Cervical six-seven,Cervical seven -thoracic one Anterior cervical decompression/diskectomy/fusion   BACK SURGERY  2023   x2 (outpatient with Dr. Danielle Dess)   CARPAL TUNNEL RELEASE Bilateral    CERVICAL FUSION     c 3-4, C 4-5, C5-6   HERNIA REPAIR     right inguinal   LAPAROTOMY N/A 03/22/2015   Procedure: DIAGONOSTIC LAPAROSCOPY, LAPROSCOPIC LYSIS OF ADHESIONS FOR ONE HOUR, DISTAL SMALL BOWEL RESECTION;  Surgeon: [name], MD;  Location: [facility];  Service: General;  Laterality: N/A;    Patient Active Problem List   Diagnosis Date Noted   Spondylolisthesis at L4-L5 level 05/07/2023   Acute pulmonary embolus (HCC) 04/19/2015   Rash and nonspecific skin eruption 04/19/2015   Diabetes mellitus, type II (HCC) 03/30/2015   Elevated LFTs 03/30/2015   Abnormal liver function tests 03/30/2015   Incisional hernia, without obstruction or gangrene 03/21/2015   Recurrent SBO (small bowel obstruction) 03/15/2015   HYPERLIPIDEMIA 03/09/2008   Iron deficiency anemia 03/09/2008   GERD 03/09/2008   Bilateral inguinal hernia (BIH), Left > Right 03/09/2008    REFERRING PROVIDER: Barnett Abu, MD   REFERRING DIAG: M54.16 (ICD-10-CM) - Radiculopathy, lumbar region     M75.41  Impingement syndrome of right shoulder  Rationale for Evaluation and Treatment: Rehabilitation  THERAPY DIAG:  Other low back pain  Muscle weakness (generalized)  ONSET DATE:  DOS 05/07/2023   SUBJECTIVE:  SUBJECTIVE STATEMENT: Pt states his shoulder surgery is scheduled for Thursday.  Pt states he shoveled snow this past week.  He didn't have pain with shoveling and states it was good for his back.  Pt states he has more of an ache than pain in lumbar.  Pt reports he has been performing more of his HEP.       PERTINENT HISTORY:  L4-S1 fusion on 05/07/2023.  Pt has B/L/T restrictions.   Pt has had 2 prior lumbar surgeries.  MRI indicating a RTC tear, arthritis, and a bone spur per Pt's wife.  Pt is planning to have a R reverse shoulder replacement.  Hx of 5 cervical surgeries including cervical fusion C2-T1.  DM type 2, peripheral neuropathy in bilat feet  CVA 12/2022--R sided visual deficits, some recall/processing deficits   Hx of PE in 2016 and 2021 HTN  PAIN:  3-4/10 current, 7/10 worst, 3/10 best central  and bilat flank lumbar pain     PRECAUTIONS: Back and Other: Lumbar fusion, B/L/T restrictions, peripheral neuropathy, hx of cervical fusion, CVA    WEIGHT BEARING RESTRICTIONS: No  FALLS:  Has patient fallen in last 6 months? Yes. Number of falls 1  LIVING ENVIRONMENT: Lives with: lives with their family Lives in: 2 story home, but stays on 1st floor mainly Stairs: yes  OCCUPATION:  Pt is retired  PLOF: Independent  PATIENT GOALS: improve mobility, balance, strength, and tolerance with household chores.  To be able to perform woodworking.    OBJECTIVE:   DIAGNOSTIC FINDINGS:  Pt is post op.      TODAY'S TREATMENT:                                                                                                                                      Pt performed:               Nustep at L4 x 5 mins, just LE's   Tandem gait with UE assist on rail in the aerobics studio x 3 laps (length of the rail)   Standing with FT on airex with EC 3x20 sec with min assist for LOB               Marching on airex with light UE support 2 x 10 reps    Squats with bilat UE assist 2x10  Lateral step ups on 4 inch step x10 and 1 set of 6-8 reps with UE support on rail   S/L clams 2x10 bilat               Supine manual HS stretch 2x30 sec bilat  Seated HS stretch 2x20 sec bilat     PATIENT EDUCATION:  Education details: dx, exercise form, relevant anatomy, HEP, POC, and rationale of interventions.  PT encouraged pt to be compliant with HEP. Person educated: Patient Education method: Explanation, Demonstration, Tactile cues, Verbal cues Education comprehension: verbalized  understanding, returned demonstration, verbal cues required, tactile cues required, and needs further education  HOME EXERCISE PROGRAM: Access Code: [email or web address] URL: [email or web address] Date: 07/31/2023 Prepared by: Aaron Edelman  Exercises - Supine Transversus Abdominis Bracing - Hands on Stomach  - 2  x daily - 7 x weekly - 2 sets - 10 reps - Log Roll  - 1 x daily - 1 x weekly - 1 sets - 1 reps - Supine March  - 1 x daily - 7 x weekly - 2 sets - 10 reps - Hooklying Clamshell with Resistance  - 1 x daily - 4-5 x weekly - 2 sets - 10 reps - Seated Long Arc Quad  - 1 x daily - 7 x weekly - 2 sets - 10 reps - Knee extension with Ankle Pumps  - 1 x daily - 7 x weekly - 2 sets - Clamshell  - 1 x daily - 5 x weekly - 2 sets - 10 reps - Side Stepping with Counter Support  - 1 x daily - 6 x weekly - 2 sets - 10 reps - Heel Raises with Counter Support  - 1 x daily - 7 x weekly - 2 sets - 10 reps - Standing Shoulder Row with Anchored Resistance  - 1 x daily - 4-5 x weekly - 2 sets - 10 reps - Shoulder extension with resistance - Neutral  - 1 x daily - 4-5 x weekly - 2 sets - 10 reps   ASSESSMENT:  CLINICAL IMPRESSION: Pt has improved tolerance to activity as evidenced by shoveling snow.  He states shoveling actually helped his back.  He has been performing more of his HEP. Pt performed ther ex and neuro re-ed activities well.  He had improved balance and performance of tandem gait.  Pt required cuing to not perform squats too low and used bilat UE support with squats.  Pt continues to have tightness in bilat HS, worse on L.  He responded well to Rx having no increased pain, just fatigue after Rx.  Pt is scheduled for R reverse shoulder replacement next Thursday.  PT will discharge lumbar fusion script.      OBJECTIVE IMPAIRMENTS: decreased activity tolerance, decreased balance, decreased endurance, decreased mobility, difficulty walking, decreased ROM, decreased strength, and pain.   ACTIVITY LIMITATIONS: carrying, lifting, bending, standing, squatting, stairs, transfers, and locomotion level  PARTICIPATION LIMITATIONS: cleaning, community activity, and church  PERSONAL FACTORS: 3+ comorbidities: prior back surgeries, cervical fusion, CVA, DM Type, peripheral neuropathy  are also affecting patient's  functional outcome.   REHAB POTENTIAL: Good  CLINICAL DECISION MAKING: Stable/uncomplicated  EVALUATION COMPLEXITY: Low   GOALS:   SHORT TERM GOALS: Target date: 08/28/2023   Pt will be independent and compliant with HEP for improved pain, strength, and function. Baseline: Goal status: PARTIALLY MET  2.  Pt will progress with core exercises per protocol without adverse effects for improved strength and performance of functional mobility. Baseline:  Goal status: GOAL MET  3.  Pt will demo correct form and be independent with log rolling. Baseline:  Goal status: GOAL MET Target date:  08/14/2023  4.  Pt will demo increased gait speed with good stability.   Baseline:  Goal status:  ONGOING Target date:  09/04/2023      LONG TERM GOALS: Target date: 01/09/2024  Pt will report he is able to perform transfers without increased pain.  Baseline:  Goal status: ONGOING  2.  Pt will report at least a 70% improvement in pain,  sx's, and mobility overall  Baseline:  Goal status:  55% MET  (no change)  1/8  3.  Pt will report improved tolerance with walking/duties at church on Sunday morning and be able to perform those duties without significant difficulty. Baseline:  Goal status: ONGOING  4.  Pt will demo improved core strength as evidenced by performance/progression of core exercises and improved bilat LE strength to 5/5 in hip flexion and knee flexion/extension and a 5# increase in bilat hip abd for improved performance of and tolerance with functional mobility skills.  Baseline:  Goal status:  PARTIALLY MET  5.  Pt will demo good control on stairs and report increased ease with performing stairs. Baseline:  Goal status: GOAL MET  12/12/2023     PLAN:   PLANNED INTERVENTIONS: Therapeutic exercises, Therapeutic activity, Neuromuscular re-education, Balance training, Gait training, Patient/Family education, Self Care, Joint mobilization, Stair training, Aquatic Therapy,  Dry Needling, Electrical stimulation, Cryotherapy, Moist heat, scar mobilization, Taping, Manual therapy, and Re-evaluation.  PLAN FOR NEXT SESSION:  Pt will be discharged from skilled PT services due to having a R reverse shoulder replacement next week.  He plans to return to PT for shoulder when MD wants him to start PT.  Pt is agreeable with discharge.  PHYSICAL THERAPY DISCHARGE SUMMARY  Visits from Start of Care: 28  Current functional level related to goals / functional outcomes: A progress note was completed prior visit.  See PN for details or goals above.   Remaining deficits: A progress note was completed prior visit.  See PN for details or goals above.   Education / Equipment: HEP   Audie Clear III PT, DPT 12/21/23 12:33 PM

## 2023-12-21 NOTE — Progress Notes (Signed)
Case: [id number] Date/Time: 12/27/23 0715   Procedure: REVERSE SHOULDER ARTHROPLASTY (Right: Shoulder) -   Anesthesia type: General   Pre-op diagnosis: Right shoulder rotator cuff tear arthropathy   Location: [facility]   Surgeons: Francena Hanly, MD       DISCUSSION: James Barber is a 78 yo male who presents to PAT prior to surgery above. PMH of HTN, OSA (uses CPAP), hx of DVT/PE on Xarelto, hx of CVA (12/2022), GERD, DM, s/p ACDF C4-T1, s/p lumbar fusion L4-S1 (05/2023).  Prior complications from anesthesia include prolonged emergence and "spikes fever". No documented fevers noted after review of multiple anesthesia encounters in the past 10 years. Only complication noted was hypotension after lumbar fusion on 05/07/23 and patient was given IVF.  Patient had an left occipital CVA in 12/2022. He was having vision changes or confusion 1 week prior. Last seen by Neurology on 08/20/23. Noted he is doing well from CVA perspective. Still has some R field of vision defects and memory difficulties at times. Had lumbar fusion in June 2024 which went well. Advised to continue ASA and f/u in 6 months. Neuro clearance signed (scanned in media on 12/17/23).  Patient was seen in follow-up after CVA by cardiology at [facility] on 01/09/2023.  Echo showed normal LV size and function, calcification anterior mitral valve leaflet, negative bubble study.  30-day event monitor was ordered which subsequently showed underlying rhythm to be sinus, 3 episodes of SVT longest being 8 beats at a rate of 155, PVC burden <0.1%, P SVC burden 0.18%, no A-fib, no VT, no long pauses 3 seconds or more noted.  No patient triggered events.   Seen by PCP for pre op exam on 11/26/23. Per PCP:   "Plan: EKG done in office shows NSR, no ST elevation. No afib or aflutter. Labs today: see orders. POCT A1C 6.3% in office. Pt and wife made aware. Cont medications as prescribed. Eliquis to be held 2 days prior to surgery. Surgical  clearance also sent to pt neurology team."  Last day of Xarelto: 12/23/23  VS: BP 116/63   Pulse 68   Temp (!) 36.4 C (Oral)   Ht 6' (1.829 m)   Wt 78.5 kg   SpO2 99%   BMI 23.46 kg/m   PROVIDERS: Beam, Chales Salmon, MD Neurology: Trena Platt, MD  LABS: Labs reviewed: Acceptable for surgery. (all labs ordered are listed, but only abnormal results are displayed)  Labs Reviewed  BASIC METABOLIC PANEL - Abnormal; Notable for the following components:      Result Value   Glucose, Bld 143 (*)    All other components within normal limits  CBC - Abnormal; Notable for the following components:   MCV 100.9 (*)    All other components within normal limits  GLUCOSE, CAPILLARY - Abnormal; Notable for the following components:   Glucose-Capillary 130 (*)    All other components within normal limits  SURGICAL PCR SCREEN     IMAGES:   EKG 05/07/23:  NSR.  Rate 80  CV:  30 Day Cardiac Event Monitor 01/02/2023: Underlying rhythm is Sinus. 3 supraventricular episodes were found. Patient was asymptomatic. No A. fib, no VT, no long pauses 3 seconds or more noted.  Echocardiogram with bubble study 12/29/2022:  Left Ventricle: Systolic function is normal. EF: 60-65%. Left Ventricle: Doppler parameters indicate normal diastolic function. Tricuspid Valve: The right ventricular systolic pressure is at upper limit of normal - about 31 mmHg. Left Atrium: Injection of  agitated saline documents no interatrial shunt. Mitral Valve: Mitral valve structure is normal. The leaflets exhibit normal excursion. There is a small echodense structure at the tip of the anterior leaflet of mitral valve and attachment of MV chordae. It measures 6.5 mm x 2.5 mm. It more likely represents calcification but old calcified vegetation cannot be excluded. Other possible etiologies also cannot be excluded of the tip of the anterior leaflet. Otherwise no thickening or calcification of mitral valve leaflets noted. Cardiac  MRI and the TEE could be considered for better evaluation of mitral valve if clinically indicated.   Past Medical History:  Diagnosis Date   Arthritis    Bowel obstruction (HCC)    Cancer (HCC)    prostate- "everything removed and clear"   Complication of anesthesia    Hard to wake and spikes fever   Diabetes mellitus without complication (HCC)    GERD (gastroesophageal reflux disease)    pt has not had issues with this in "a long time"   History of pulmonary embolus (PE)    Hypertension    Hypothyroidism    Ischemic stroke (HCC) 12/2022   Sleep apnea     Past Surgical History:  Procedure Laterality Date   ABDOMINAL SURGERY     "had ileostomy for about 4 months and had it reversed- due to salmonella infection- this happened in [place]"   ANTERIOR CERVICAL DECOMP/DISCECTOMY FUSION  11/12/2012   Procedure: ANTERIOR CERVICAL DECOMPRESSION/DISCECTOMY FUSION 2 LEVELS;  Surgeon: Barnett Abu, MD;  Location: [facility];  Service: Neurosurgery;  Laterality: N/A;  Right Side approach Cervical six-seven,Cervical seven -thoracic one Anterior cervical decompression/diskectomy/fusion   BACK SURGERY  2023   x2 (outpatient with Dr. Danielle Dess)   CARPAL TUNNEL RELEASE Bilateral    CERVICAL FUSION     c 3-4, C 4-5, C5-6   HERNIA REPAIR     right inguinal   LAPAROTOMY N/A 03/22/2015   Procedure: DIAGONOSTIC LAPAROSCOPY, LAPROSCOPIC LYSIS OF ADHESIONS FOR ONE HOUR, DISTAL SMALL BOWEL RESECTION;  Surgeon: [name], MD;  Location: [facility];  Service: General;  Laterality: N/A;    MEDICATIONS:  acetaminophen (TYLENOL) 500 MG tablet   aspirin EC 81 MG tablet   atorvastatin (LIPITOR) 80 MG tablet   brimonidine (ALPHAGAN) 0.2 % ophthalmic solution   DULoxetine (CYMBALTA) 20 MG capsule   empagliflozin (JARDIANCE) 25 MG TABS tablet   ferrous sulfate 325 (65 FE) MG EC tablet   gabapentin (NEURONTIN) 800 MG tablet   glipiZIDE (GLUCOTROL XL) 10 MG 24 hr tablet   levothyroxine (SYNTHROID) 100 MCG  tablet   lisinopril (PRINIVIL,ZESTRIL) 20 MG tablet   omeprazole (PRILOSEC) 20 MG capsule   rivaroxaban (XARELTO) 10 MG TABS tablet   No current facility-administered medications for this encounter.   Marcille Blanco [facility] Short Stay/Anesthesiology [facility] Phone [phone] 12/21/2023 11:43 AM

## 2023-12-21 NOTE — Anesthesia Preprocedure Evaluation (Signed)
Anesthesia Evaluation  Patient identified by MRN, date of birth, ID band Patient awake    Reviewed: Allergy & Precautions, NPO status , Patient's Chart, lab work & pertinent test results  Airway Mallampati: II  TM Distance: >3 FB Neck ROM: Full    Dental no notable dental hx. (+) Teeth Intact, Dental Advisory Given   Pulmonary sleep apnea and Continuous Positive Airway Pressure Ventilation , PE (on Xarelto)   Pulmonary exam normal breath sounds clear to auscultation       Cardiovascular hypertension, + DVT  Normal cardiovascular exam Rhythm:Regular Rate:Normal  12/2022 TTE Left Ventricle: Systolic function is normal. EF: 60-65%. Left Ventricle: Doppler parameters indicate normal diastolic function.    Neuro/Psych CVA (12/2022 L occipital), Residual Symptoms    GI/Hepatic ,GERD  Medicated and Controlled,,  Endo/Other  diabetes, Type 2Hypothyroidism    Renal/GU      Musculoskeletal  (+) Arthritis ,  S/P ACDF c4-T1   Abdominal   Peds  Hematology Lab Results      Component                Value               Date                      WBC                      5.7                 12/20/2023                HGB                      14.4                12/20/2023                HCT                      44.6                12/20/2023                MCV                      100.9 (H)           12/20/2023                PLT                      239                 12/20/2023              Anesthesia Other Findings   Reproductive/Obstetrics                             Anesthesia Physical Anesthesia Plan  ASA: 3  Anesthesia Plan: General   Post-op Pain Management: Regional block* and Minimal or no pain anticipated   Induction: Intravenous  PONV Risk Score and Plan: 3 and Treatment may vary due to age or medical condition, Ondansetron, TIVA and Propofol infusion  Airway Management Planned: Oral  ETT and Video Laryngoscope Planned  Additional Equipment: None  Intra-op Plan:   Post-operative  Plan: Extubation in OR  Informed Consent: I have reviewed the patients History and Physical, chart, labs and discussed the procedure including the risks, benefits and alternatives for the proposed anesthesia with the patient or authorized representative who has indicated his/her understanding and acceptance.     Dental advisory given  Plan Discussed with: CRNA and Anesthesiologist  Anesthesia Plan Comments: (See PAT note from 1/16 by K Gekas PA-C  GA TIVA  w R ISB )        Anesthesia Quick Evaluation

## 2023-12-24 ENCOUNTER — Ambulatory Visit (HOSPITAL_BASED_OUTPATIENT_CLINIC_OR_DEPARTMENT_OTHER): Payer: Medicare Other | Admitting: Physical Therapy

## 2023-12-26 ENCOUNTER — Encounter (HOSPITAL_BASED_OUTPATIENT_CLINIC_OR_DEPARTMENT_OTHER): Payer: Medicare Other | Admitting: Physical Therapy

## 2023-12-27 ENCOUNTER — Ambulatory Visit (HOSPITAL_COMMUNITY): Payer: Self-pay | Admitting: Anesthesiology

## 2023-12-27 ENCOUNTER — Ambulatory Visit (HOSPITAL_COMMUNITY)
Admission: RE | Admit: 2023-12-27 | Discharge: 2023-12-27 | Disposition: A | Discharge status: Home / Self Care | Payer: Medicare Other | Source: Ambulatory Visit | Attending: Orthopedic Surgery | Admitting: Orthopedic Surgery

## 2023-12-27 ENCOUNTER — Other Ambulatory Visit: Payer: Self-pay

## 2023-12-27 ENCOUNTER — Encounter (HOSPITAL_COMMUNITY): Payer: Self-pay | Admitting: Orthopedic Surgery

## 2023-12-27 ENCOUNTER — Ambulatory Visit (HOSPITAL_COMMUNITY): Payer: Self-pay | Admitting: Medical

## 2023-12-27 ENCOUNTER — Encounter (HOSPITAL_COMMUNITY)
Admission: RE | Disposition: A | Discharge status: Home / Self Care | Payer: Self-pay | Source: Ambulatory Visit | Attending: Orthopedic Surgery

## 2023-12-27 DIAGNOSIS — M12811 Other specific arthropathies, not elsewhere classified, right shoulder: Secondary | ICD-10-CM | POA: Diagnosis not present

## 2023-12-27 DIAGNOSIS — E119 Type 2 diabetes mellitus without complications: Secondary | ICD-10-CM

## 2023-12-27 DIAGNOSIS — M75101 Unspecified rotator cuff tear or rupture of right shoulder, not specified as traumatic: Secondary | ICD-10-CM | POA: Insufficient documentation

## 2023-12-27 DIAGNOSIS — I10 Essential (primary) hypertension: Secondary | ICD-10-CM | POA: Diagnosis not present

## 2023-12-27 DIAGNOSIS — E039 Hypothyroidism, unspecified: Secondary | ICD-10-CM

## 2023-12-27 HISTORY — PX: REVERSE SHOULDER ARTHROPLASTY: SHX5054

## 2023-12-27 LAB — GLUCOSE, CAPILLARY
Glucose-Capillary: 125 mg/dL — ABNORMAL HIGH (ref 70–99)
Glucose-Capillary: 110 mg/dL — ABNORMAL HIGH (ref 70–99)

## 2023-12-27 SURGERY — ARTHROPLASTY, SHOULDER, TOTAL, REVERSE
Anesthesia: General | Site: Shoulder | Laterality: Right

## 2023-12-27 MED ORDER — PROPOFOL 10 MG/ML IV BOLUS
INTRAVENOUS | Status: AC
Start: 2023-12-27 — End: ?
  Filled 2023-12-27: qty 20

## 2023-12-27 MED ORDER — ONDANSETRON HCL 4 MG/2ML IJ SOLN
4.0000 mg | Freq: Once | INTRAMUSCULAR | Status: DC | PRN
Start: 2023-12-27 — End: 2023-12-27

## 2023-12-27 MED ORDER — FENTANYL CITRATE (PF) 100 MCG/2ML IJ SOLN
INTRAMUSCULAR | Status: DC | PRN
  Administered 2023-12-27 (×2): 25 ug via INTRAVENOUS
  Administered 2023-12-27: 50 ug via INTRAVENOUS

## 2023-12-27 MED ORDER — OXYCODONE-ACETAMINOPHEN 5-325 MG PO TABS: 1.0000 | ORAL_TABLET | ORAL | 0 refills | Status: AC | PRN

## 2023-12-27 MED ORDER — LACTATED RINGERS IV SOLN: INTRAVENOUS | Status: DC

## 2023-12-27 MED ORDER — BUPIVACAINE LIPOSOME 1.3 % IJ SUSP
INTRAMUSCULAR | Status: DC | PRN
  Administered 2023-12-27: 10 mL via PERINEURAL

## 2023-12-27 MED ORDER — ONDANSETRON HCL 4 MG/2ML IJ SOLN
INTRAMUSCULAR | Status: DC | PRN
  Administered 2023-12-27: 4 mg via INTRAVENOUS

## 2023-12-27 MED ORDER — PHENYLEPHRINE HCL (PRESSORS) 10 MG/ML IV SOLN
INTRAVENOUS | Status: DC | PRN
  Administered 2023-12-27: 160 ug via INTRAVENOUS

## 2023-12-27 MED ORDER — TRANEXAMIC ACID 1000 MG/10ML IV SOLN
1000.0000 mg | INTRAVENOUS | Status: DC
Start: 2023-12-27 — End: 2023-12-27

## 2023-12-27 MED ORDER — 0.9 % SODIUM CHLORIDE (POUR BTL) OPTIME
TOPICAL | Status: DC | PRN
  Administered 2023-12-27: 1000 mL

## 2023-12-27 MED ORDER — VANCOMYCIN HCL 1000 MG IV SOLR
INTRAVENOUS | Status: DC | PRN
  Administered 2023-12-27: 1000 mg

## 2023-12-27 MED ORDER — PHENYLEPHRINE HCL-NACL 20-0.9 MG/250ML-% IV SOLN
INTRAVENOUS | Status: DC | PRN
  Administered 2023-12-27: 30 ug/min via INTRAVENOUS

## 2023-12-27 MED ORDER — DEXAMETHASONE SODIUM PHOSPHATE 10 MG/ML IJ SOLN
INTRAMUSCULAR | Status: AC
  Filled 2023-12-27: qty 1

## 2023-12-27 MED ORDER — SUGAMMADEX SODIUM 200 MG/2ML IV SOLN
INTRAVENOUS | Status: DC | PRN
  Administered 2023-12-27: 100 mg via INTRAVENOUS
  Administered 2023-12-27: 200 mg via INTRAVENOUS

## 2023-12-27 MED ORDER — ACETAMINOPHEN 10 MG/ML IV SOLN: 1000.0000 mg | Freq: Once | INTRAVENOUS | Status: DC | PRN

## 2023-12-27 MED ORDER — FENTANYL CITRATE PF 50 MCG/ML IJ SOSY
25.0000 ug | PREFILLED_SYRINGE | INTRAMUSCULAR | Status: DC | PRN
Start: 2023-12-27 — End: 2023-12-27

## 2023-12-27 MED ORDER — MIDAZOLAM HCL 5 MG/5ML IJ SOLN
INTRAMUSCULAR | Status: DC | PRN
  Administered 2023-12-27: .5 mg via INTRAVENOUS

## 2023-12-27 MED ORDER — CHLORHEXIDINE GLUCONATE 0.12 % MT SOLN
15.0000 mL | Freq: Once | OROMUCOSAL | Status: AC
  Administered 2023-12-27: 15 mL via OROMUCOSAL

## 2023-12-27 MED ORDER — ORAL CARE MOUTH RINSE
15.0000 mL | Freq: Once | OROMUCOSAL | Status: AC
Start: 2023-12-27 — End: 2023-12-27

## 2023-12-27 MED ORDER — CYCLOBENZAPRINE HCL 10 MG PO TABS: 10.0000 mg | ORAL_TABLET | Freq: Three times a day (TID) | ORAL | 1 refills | Status: AC | PRN

## 2023-12-27 MED ORDER — CEFAZOLIN SODIUM-DEXTROSE 2-4 GM/100ML-% IV SOLN
2.0000 g | INTRAVENOUS | Status: AC
  Administered 2023-12-27: 2 g via INTRAVENOUS
  Filled 2023-12-27: qty 100

## 2023-12-27 MED ORDER — FENTANYL CITRATE (PF) 100 MCG/2ML IJ SOLN
INTRAMUSCULAR | Status: AC
  Filled 2023-12-27: qty 2

## 2023-12-27 MED ORDER — PROPOFOL 10 MG/ML IV BOLUS
INTRAVENOUS | Status: DC | PRN
  Administered 2023-12-27: 160 ug/kg/min via INTRAVENOUS

## 2023-12-27 MED ORDER — INSULIN ASPART 100 UNIT/ML IJ SOLN: 0.0000 [IU] | INTRAMUSCULAR | Status: DC | PRN

## 2023-12-27 MED ORDER — ONDANSETRON HCL 4 MG PO TABS: 4.0000 mg | ORAL_TABLET | Freq: Three times a day (TID) | ORAL | 0 refills | Status: AC | PRN

## 2023-12-27 MED ORDER — STERILE WATER FOR IRRIGATION IR SOLN
Status: DC | PRN
  Administered 2023-12-27: 1000 mL

## 2023-12-27 MED ORDER — PROPOFOL 10 MG/ML IV BOLUS
INTRAVENOUS | Status: AC
  Filled 2023-12-27: qty 20

## 2023-12-27 MED ORDER — TRANEXAMIC ACID-NACL 1000-0.7 MG/100ML-% IV SOLN
1000.0000 mg | INTRAVENOUS | Status: AC
  Administered 2023-12-27: 1000 mg via INTRAVENOUS
  Filled 2023-12-27: qty 100

## 2023-12-27 MED ORDER — LIDOCAINE HCL (CARDIAC) PF 100 MG/5ML IV SOSY
PREFILLED_SYRINGE | INTRAVENOUS | Status: DC | PRN
  Administered 2023-12-27: 100 mg via INTRAVENOUS

## 2023-12-27 MED ORDER — ROCURONIUM BROMIDE 100 MG/10ML IV SOLN
INTRAVENOUS | Status: DC | PRN
  Administered 2023-12-27: 50 mg via INTRAVENOUS

## 2023-12-27 MED ORDER — EPHEDRINE SULFATE-NACL 50-0.9 MG/10ML-% IV SOSY
PREFILLED_SYRINGE | INTRAVENOUS | Status: DC | PRN
  Administered 2023-12-27: 5 mg via INTRAVENOUS
  Administered 2023-12-27: 7.5 mg via INTRAVENOUS

## 2023-12-27 MED ORDER — MIDAZOLAM HCL 2 MG/2ML IJ SOLN
INTRAMUSCULAR | Status: AC
  Filled 2023-12-27: qty 2

## 2023-12-27 MED ORDER — BUPIVACAINE HCL (PF) 0.5 % IJ SOLN
INTRAMUSCULAR | Status: DC | PRN
  Administered 2023-12-27: 15 mL via PERINEURAL

## 2023-12-27 MED ORDER — DEXAMETHASONE SODIUM PHOSPHATE 10 MG/ML IJ SOLN
INTRAMUSCULAR | Status: DC | PRN
  Administered 2023-12-27: 8 mg via INTRAVENOUS

## 2023-12-27 MED ORDER — ONDANSETRON HCL 4 MG/2ML IJ SOLN
INTRAMUSCULAR | Status: AC
Start: 2023-12-27 — End: ?
  Filled 2023-12-27: qty 2

## 2023-12-27 MED ORDER — VANCOMYCIN HCL 1000 MG IV SOLR
INTRAVENOUS | Status: AC
  Filled 2023-12-27: qty 20

## 2023-12-27 SURGICAL SUPPLY — 63 items
BAG COUNTER SPONGE SURGICOUNT (BAG) IMPLANT
BAG ZIPLOCK 12X15 (MISCELLANEOUS) ×1 IMPLANT
BIT DRILL AR 3 NS (BIT) IMPLANT
BLADE SAW SGTL 83.5X18.5 (BLADE) ×1 IMPLANT
BNDG COHESIVE 4X5 TAN STRL LF (GAUZE/BANDAGES/DRESSINGS) ×1 IMPLANT
CLSR STERI-STRIP ANTIMIC 1/2X4 (GAUZE/BANDAGES/DRESSINGS) IMPLANT
COOLER ICEMAN CLASSIC (MISCELLANEOUS) ×1 IMPLANT
COVER BACK TABLE 60X90IN (DRAPES) ×1 IMPLANT
COVER SURGICAL LIGHT HANDLE (MISCELLANEOUS) ×1 IMPLANT
CUP SUT UNIV REVERS 39 NEU (Shoulder) IMPLANT
DRAPE SHEET LG 3/4 BI-LAMINATE (DRAPES) ×1 IMPLANT
DRAPE SURG 17X11 SM STRL (DRAPES) ×1 IMPLANT
DRAPE SURG ORHT 6 SPLT 77X108 (DRAPES) ×2 IMPLANT
DRAPE TOP 10253 STERILE (DRAPES) ×1 IMPLANT
DRAPE U-SHAPE 47X51 STRL (DRAPES) ×1 IMPLANT
DRESSING AQUACEL AG SP 3.5X6 (GAUZE/BANDAGES/DRESSINGS) ×1 IMPLANT
DRSG AQUACEL AG ADV 3.5X 6 (GAUZE/BANDAGES/DRESSINGS) IMPLANT
DRSG AQUACEL AG ADV 3.5X10 (GAUZE/BANDAGES/DRESSINGS) IMPLANT
DRSG AQUACEL AG SP 3.5X6 (GAUZE/BANDAGES/DRESSINGS) ×1 IMPLANT
DURAPREP 26ML APPLICATOR (WOUND CARE) ×1 IMPLANT
ELECT BLADE TIP CTD 4 INCH (ELECTRODE) ×1 IMPLANT
ELECT PENCIL ROCKER SW 15FT (MISCELLANEOUS) ×1 IMPLANT
ELECT REM PT RETURN 15FT ADLT (MISCELLANEOUS) ×1 IMPLANT
FACESHIELD WRAPAROUND (MASK) ×5 IMPLANT
FACESHIELD WRAPAROUND OR TEAM (MASK) ×5 IMPLANT
GLENOID UNI REV MOD 24 +2 LAT (Joint) IMPLANT
GLENOSPHERE 39+4 LAT/24 UNI RV (Joint) IMPLANT
GLOVE BIO SURGEON STRL SZ7.5 (GLOVE) ×1 IMPLANT
GLOVE BIO SURGEON STRL SZ8 (GLOVE) ×1 IMPLANT
GLOVE SS BIOGEL STRL SZ 7 (GLOVE) ×1 IMPLANT
GLOVE SS BIOGEL STRL SZ 7.5 (GLOVE) ×1 IMPLANT
GOWN STRL SURGICAL XL XLNG (GOWN DISPOSABLE) ×2 IMPLANT
INSERT HUM REV 39 +6 (Insert) IMPLANT
KIT BASIN OR (CUSTOM PROCEDURE TRAY) ×1 IMPLANT
KIT TURNOVER KIT A (KITS) IMPLANT
MANIFOLD NEPTUNE II (INSTRUMENTS) ×1 IMPLANT
NDL TAPERED W/ NITINOL LOOP (MISCELLANEOUS) ×1 IMPLANT
NEEDLE TAPERED W/ NITINOL LOOP (MISCELLANEOUS) ×1 IMPLANT
NS IRRIG 1000ML POUR BTL (IV SOLUTION) ×1 IMPLANT
PACK SHOULDER (CUSTOM PROCEDURE TRAY) ×1 IMPLANT
PAD ARMBOARD 7.5X6 YLW CONV (MISCELLANEOUS) ×1 IMPLANT
PAD COLD SHLDR WRAP-ON (PAD) ×1 IMPLANT
PIN NITINOL TARGETER 2.8 (PIN) IMPLANT
PIN SET MODULAR GLENOID SYSTEM (PIN) IMPLANT
RESTRAINT HEAD UNIVERSAL NS (MISCELLANEOUS) ×1 IMPLANT
SCREW CENTRAL MOD 30MM (Screw) IMPLANT
SCREW PERI LOCK 5.5X24 (Screw) IMPLANT
SCREW PERI LOCK 5.5X36 (Screw) IMPLANT
SCREW PERIPHERAL 5.5X20 LOCK (Screw) IMPLANT
SLING ARM FOAM STRAP LRG (SOFTGOODS) IMPLANT
SLING ARM FOAM STRAP MED (SOFTGOODS) IMPLANT
STEM HUMERAL UNI REVERSE SZ10 (Stem) IMPLANT
STRIP CLOSURE SKIN 1/2X4 (GAUZE/BANDAGES/DRESSINGS) ×1 IMPLANT
SUT MNCRL AB 3-0 PS2 18 (SUTURE) ×1 IMPLANT
SUT MON AB 2-0 CT1 36 (SUTURE) ×1 IMPLANT
SUT VIC AB 1 CT1 36 (SUTURE) ×1 IMPLANT
SUTURE TAPE 1.3 40 TPR END (SUTURE) ×2 IMPLANT
SUTURETAPE 1.3 40 TPR END (SUTURE) ×2 IMPLANT
TOWEL GREEN STERILE FF (TOWEL DISPOSABLE) ×1 IMPLANT
TOWEL OR 17X26 10 PK STRL BLUE (TOWEL DISPOSABLE) ×1 IMPLANT
TUBE SUCTION HIGH CAP CLEAR NV (SUCTIONS) ×1 IMPLANT
TUBING CONNECTING 10 (TUBING) ×1 IMPLANT
WATER STERILE IRR 1000ML POUR (IV SOLUTION) ×2 IMPLANT

## 2023-12-27 NOTE — Op Note (Signed)
12/27/2023  9:01 AM  PATIENT:   James Barber  78 y.o. male  PRE-OPERATIVE DIAGNOSIS:  Right shoulder rotator cuff tear arthropathy  POST-OPERATIVE DIAGNOSIS: Same  PROCEDURE: Right shoulder reverse arthroplasty utilizing a press-fit size 10 Arthrex stem with a neutral metaphysis, +6 constrained polyethylene insert, 39/+4 glenosphere and a small/+2 baseplate  SURGEON:  Mailey Landstrom, Vania Rea M.D.  ASSISTANTS: Ralene Bathe, PA-C  Ralene Bathe, PA-C was utilized as an Geophysicist/field seismologist throughout this case, essential for help with positioning the patient, positioning extremity, tissue manipulation, implantation of the prosthesis, suture management, wound closure, and intraoperative decision-making.  ANESTHESIA:   General Endotracheal and interscalene block with Exparel  EBL: 150 cc  SPECIMEN: None  Drains: None   PATIENT DISPOSITION:  PACU - hemodynamically stable.    PLAN OF CARE: Discharge to home after PACU  Brief history:  Patient is a 78 year old male with chronic and progressive increasing right shoulder pain related to severe osteoarthritis and rotator cuff dysfunction and degeneration.  Due to his increasing pain and failure to respond to prolonged attempts at conservative management, he is brought to the operating this time for planned right shoulder reverse arthroplasty.  Preoperatively, I counseled the patient regarding treatment options and risks versus benefits thereof.  Possible surgical complications were all reviewed including potential for bleeding, infection, neurovascular injury, persistent pain, loss of motion, anesthetic complication, failure of the implant, and possible need for additional surgery. They understand and accept and agrees with our planned procedure.   Procedure detail:  After undergoing routine preop evaluation the patient received prophylactic antibiotics and interscalene block with Exparel was established in the holding area by the anesthesia  department.  Subsequently placed spine on the operating table and underwent the smooth induction of a general endotracheal anesthesia.  Placed into the beachchair position and appropriately padded and protected.  The right shoulder girdle region was sterilely prepped and draped in standard fashion.  Timeout was called.  A deltopectoral approach to the right shoulder is made an approximately 8 cm incision.  Skin flaps were elevated dissection carried deeply deltopectoral interval was then developed from proximal to distal with the vein taken laterally.  The conjoined tendon was mobilized and retracted medially.  The long head biceps tendon was tenodesed at the upper border the pectoralis major tendon with the proximal segment unroofed and excised.  Subscapularis was then separated from the lesser tuberosity using electrocautery and tagged with a pair of grasping suture tape sutures.  Superior rotator cuff was then split to the base of the coracoid and the subscap was then reflected medially and capsular attachments were divided from the anterior and inferior margins of the humeral neck allowing deliver the humeral head through the wound.  An extra medullary guide was then used to outline the proposed humeral head resection which we performed with an oscillating saw at approximately 20 degrees of retroversion.  Marginal osteophytes were removed with rondure and a metal cap was placed over the cut proximal humeral surface.  Glenoid was then exposed and a circumferential labral resection was then performed.  A guidepin was then directed into the center of the glenoid and the glenoid was reamed with the central followed by the peripheral reamer to a stable subchondral bony bed.  Preparation completed the drill and tapped for a 30 mm lag screw.  Our baseplate was then assembled and vancomycin powder applied to the threads of the lag screw the baseplate was inserted with excellent purchase and fixation.  All the peripheral  locking screws were then placed using standard technique with excellent fixation.  A 39/+4 glenosphere was then impacted onto the baseplate and a central locking screw was placed.  We returned attention back to the humeral metaphysis where the canal was opened by hand reaming and ultimately broached up to a size 10 at approximately 20 degrees of retroversion.  A neutral metaphyseal reaming guide was then used to prepare the metaphysis and a trial implant was then placed which showed good motion stability and soft tissue balance.  The trial was then removed.  Our final implant was then assembled.  Vancomycin powder applied into the canal after being cleaned and dried.  Final implant was then seated with excellent fixation.  Series of trial reductions was then performed and ultimately felt that a +6 poly gave is the best motion stability and soft tissue balance.  A +6 constrained poly was then impacted onto the implant and the final reduction was performed showing good motion stability and soft tissue balance.  Wound was copiously irrigated.  Final hemostasis was obtained.  The subscapularis was mobilized and confirmed good elasticity and was then repaired back to thigh its on the collar the implant.  The deltopectoral interval was then reapproximated the series of figure-of-eight number Vicryl sutures after the balance of the vancomycin powder had been sprayed liberally throughout the deep soft tissue planes.  2-0 Monocryl used to close the subcu layer and intracuticular 3-0 Monocryl used to close the skin followed by Steri-Strips and Aquacel dressing.  The right arm was placed into a sling.  Patient was awakened, extubated, and taken to the recovery him in stable condition.  Senaida Lange MD   Contact # [phone]

## 2023-12-27 NOTE — Anesthesia Procedure Notes (Signed)
Procedure Name: Intubation Date/Time: 12/27/2023 7:42 AM  Performed by: Randa Evens, CRNAPre-anesthesia Checklist: Patient identified, Emergency Drugs available, Suction available and Patient being monitored Patient Re-evaluated:Patient Re-evaluated prior to induction Oxygen Delivery Method: Circle System Utilized Preoxygenation: Pre-oxygenation with 100% oxygen Induction Type: IV induction Ventilation: Mask ventilation without difficulty Laryngoscope Size: Glidescope and 4 Grade View: Grade I Tube type: Oral Number of attempts: 1 Airway Equipment and Method: Stylet and Oral airway Placement Confirmation: ETT inserted through vocal cords under direct vision, positive ETCO2 and breath sounds checked- equal and bilateral Secured at: 22 cm Tube secured with: Tape Dental Injury: Teeth and Oropharynx as per pre-operative assessment

## 2023-12-27 NOTE — Transfer of Care (Signed)
Immediate Anesthesia Transfer of Care Note  Patient: James Barber  Procedure(s) Performed: REVERSE SHOULDER ARTHROPLASTY (Right: Shoulder)  Patient Location: PACU  Anesthesia Type:General  Level of Consciousness: awake and alert   Airway & Oxygen Therapy: Patient Spontanous Breathing  Post-op Assessment: Report given to RN  Post vital signs: Reviewed and stable  Last Vitals:  Vitals Value Taken Time  BP 140/61 12/27/23 0923  Temp    Pulse 76 12/27/23 0929  Resp 16 12/27/23 0929  SpO2 99 % 12/27/23 0929  Vitals shown include unfiled device data.  Last Pain:  Vitals:   12/27/23 0627  TempSrc:   PainSc: 2          Complications: No notable events documented.

## 2023-12-27 NOTE — Discharge Instructions (Signed)
? ?James Barber, M.D., F.A.A.O.S. ?Orthopaedic Surgery ?Specializing in Arthroscopic and Reconstructive ?Surgery of the Shoulder ?[phone] ?[address] - [place],  [address] - Fax [phone] ? ? ?POST-OP TOTAL SHOULDER REPLACEMENT INSTRUCTIONS ? ?1. Follow up in the office for your first post-op appointment 10-14 days from the date of your surgery. If you do not already have a scheduled appointment, our office will contact you to schedule. ? ?2. The bandage over your incision is waterproof. You may begin showering with this dressing on. You may leave this dressing on until first follow up appointment within 2 weeks. We prefer you leave this dressing in place until follow up however after 5-7 days if you are having itching or skin irritation and would like to remove it you may do so. Go slow and tug at the borders gently to break the bond the dressing has with the skin. At this point if there is no drainage it is okay to go without a bandage or you may cover it with a light guaze and tape. You can also expect significant bruising around your shoulder that will drift down your arm and into your chest wall. This is very normal and should resolve over several days. ? ? 3. Wear your sling/immobilizer at all times except to perform the exercises below or to occasionally let your arm dangle by your side to stretch your elbow. You also need to sleep in your sling immobilizer until instructed otherwise. It is ok to remove your sling if you are sitting in a controlled environment and allow your arm to rest in a position of comfort by your side or on your lap with pillows to give your neck and skin a break from the sling. You may remove it to allow arm to dangle by side to shower. If you are up walking around and when you go to sleep at night you need to wear it. ? ?4. Range of motion to your elbow, wrist, and hand are encouraged 3-5 times daily. Exercise to your hand and fingers helps to reduce  swelling you may experience. ? ? ?5. Prescriptions for a pain medication and a muscle relaxant are provided for you. It is recommended that if you are experiencing pain that you pain medication alone is not controlling, add the muscle relaxant along with the pain medication which can give additional pain relief. The first 1-2 days is generally the most severe of your pain and then should gradually decrease. As your pain lessens it is recommended that you decrease your use of the pain medications to an "as needed basis'" only and to always comply with the recommended dosages of the pain medications. ? ?6. Pain medications can produce constipation along with their use. If you experience this, the use of an over the counter stool softener or laxative daily is recommended.  ? ?7. For additional questions or concerns, please do not hesitate to call the office. If after hours there is an answering service to forward your concerns to the physician on call. ? ?8.Pain control following an exparel block ? ?To help control your post-operative pain you received a nerve block  performed with Exparel which is a long acting anesthetic (numbing agent) which can provide pain relief and sensations of numbness (and relief of pain) in the operative shoulder and arm for up to 3 days. Sometimes it provides mixed relief, meaning you may still have numbness in certain areas of the arm but can still be able to   move  parts of that arm, hand, and fingers. We recommend that your prescribed pain medications  be used as needed. We do not feel it is necessary to "pre medicate" and "stay ahead" of pain.  Taking narcotic pain medications when you are not having any pain can lead to unnecessary and potentially dangerous side effects.   ? ?9. Use the ice machine as much as possible in the first 5-7 days from surgery, then you can wean its use to as needed. The ice typically needs to be replaced every 6 hours, instead of ice you can actually freeze  water bottles to put in the cooler and then fill water around them to avoid having to purchase ice. You can have spare water bottles freezing to allow you to rotate them once they have melted. Try to have a thin shirt or light cloth or towel under the ice wrap to protect your skin.  ? ?FOR ADDITIONAL INFO ON ICE MACHINE AND INSTRUCTIONS GO TO THE WEBSITE AT ? ?https://www.[employer].com/products/donjoy/donjoy-iceman-classic3 ? ?10.  We recommend that you avoid any dental work or cleaning in the first 3 months following your joint replacement. This is to help minimize the possibility of infection from the bacteria in your mouth that enters your bloodstream during dental work. We also recommend that you take an antibiotic prior to your dental work for the first year after your shoulder replacement to further help reduce that risk. Please simply contact our office for antibiotics to be sent to your pharmacy prior to dental work. ? ?11. Dental Antibiotics: ? ?We recommend waiting at least 3 months for any dental work even cleanings unless there is a dental emergency. We also recommend  prophylactic antibiotics for all dental procdeures  the first year following your joint replacement. In some exceptions we recommend them to be used lifelong. We will provide you with that prescription in follow up office visits, or you can call our office. ? ?Exceptions are as follows: ? ?1. History of prior total joint infection ? ?2. Severely immunocompromised (Organ Transplant, cancer chemotherapy, Rheumatoid biologic ?meds such as Humera) ? ?3. Poorly controlled diabetes (A1C &gt; 8.0, blood glucose over 200) ? ? ?POST-OP EXERCISES ? ?Pendulum Exercises ? ?Perform pendulum exercises while standing and bending at the waist. Support your uninvolved arm on a table or chair and allow your operated arm to hang freely. Make sure to do these exercises passively - not using you shoulder muscles. These exercises can be performed once your  nerve block effects have worn off. ? ?Repeat 20 times. Do 3 sessions per day. ? ? ?  ?

## 2023-12-27 NOTE — Anesthesia Postprocedure Evaluation (Signed)
Anesthesia Post Note  Patient: James Barber  Procedure(s) Performed: REVERSE SHOULDER ARTHROPLASTY (Right: Shoulder)     Patient location during evaluation: PACU Anesthesia Type: General and Regional Level of consciousness: awake and alert Pain management: pain level controlled Vital Signs Assessment: post-procedure vital signs reviewed and stable Respiratory status: spontaneous breathing, nonlabored ventilation, respiratory function stable and patient connected to nasal cannula oxygen Cardiovascular status: blood pressure returned to baseline and stable Postop Assessment: no apparent nausea or vomiting Anesthetic complications: no   No notable events documented.  Last Vitals:  Vitals:   12/27/23 1041 12/27/23 1100  BP: 139/85 135/84  Pulse: 60 63  Resp: 19   Temp: (!) 36.3 C   SpO2: 97% 98%    Last Pain:  Vitals:   12/27/23 1041  TempSrc:   PainSc: 0-No pain                 Trevor Iha

## 2023-12-27 NOTE — H&P (Signed)
James Barber    Chief Complaint: Right shoulder rotator cuff tear arthropathy HPI: The patient is a 78 y.o. male with chronic and progressively increasing right shoulder pain related to severe rotator cuff tear arthropathy.  Additionally, the patient has a number of significant underlying chronic medical comorbidities for which she has been evaluated by his primary care providers as well as the anesthesia service preoperatively.  He does have known chronic weakness involving his right upper extremity but clinical exam demonstrates function of the deltoid appropriate for consideration of a reverse shoulder arthroplasty.  Due to his increasing pain and failure to respond to prolonged attempts at conservative management, he is brought to the operating this time for planned right shoulder reverse arthroplasty.  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 Danielle Dess)   CARPAL TUNNEL RELEASE Bilateral    CERVICAL  FUSION     c 3-4, C 4-5, C5-6   HERNIA REPAIR     right inguinal   LAPAROTOMY N/A 03/22/2015   Procedure: DIAGONOSTIC LAPAROSCOPY, LAPROSCOPIC LYSIS OF ADHESIONS FOR ONE HOUR, DISTAL SMALL BOWEL RESECTION;  Surgeon: [name], MD;  Location: [facility];  Service: General;  Laterality: N/A;    No family history on file.  Social History:  reports that he has never smoked. He has never used smokeless tobacco. He reports that he does not drink alcohol and does not use drugs.  BMI: Estimated body mass index is 23.46 kg/m as calculated from the following:   Height as of 12/20/23: 6' (1.829 m).   Weight as of 12/20/23: 78.5 kg.  Lab Results  Component Value Date   ALBUMIN 4.1 06/30/2015   Diabetes:   Patient has a diagnosis of diabetes,  Lab Results  Component Value Date   HGBA1C 6.9 (H) 05/08/2023   Smoking Status:   reports that he has never smoked. He has never used smokeless tobacco.     Medications Prior to Admission  Medication Sig Dispense Refill   acetaminophen (TYLENOL) 500 MG tablet Take 500-1,000 mg by mouth every 6 (six) hours as needed (pain.).     aspirin EC 81 MG tablet Take 81 mg by mouth at bedtime.     atorvastatin (LIPITOR) 80 MG tablet Take 80 mg by mouth at bedtime.     brimonidine (ALPHAGAN) 0.2 % ophthalmic solution Place 1 drop into the left eye in the morning and at bedtime.  DULoxetine (CYMBALTA) 20 MG capsule Take 20 mg by mouth at bedtime.     empagliflozin (JARDIANCE) 25 MG TABS tablet Take 25 mg by mouth in the morning.     ferrous sulfate 325 (65 FE) MG EC tablet Take 325 mg by mouth every evening.     gabapentin (NEURONTIN) 800 MG tablet Take 1,200-1,600 mg by mouth See admin instructions. Take 1.5 tablets (1200 mg) by mouth in the morning & take 2 tablets (1600 mg) by mouth at night.     glipiZIDE (GLUCOTROL XL) 10 MG 24 hr tablet Take 10 mg by mouth at bedtime.     levothyroxine (SYNTHROID) 100 MCG tablet Take 100 mcg by mouth at bedtime.      lisinopril (PRINIVIL,ZESTRIL) 20 MG tablet Resume one daily for systolic blood pressure 120 or greater.  If less call PCP and ask for recommendations.     omeprazole (PRILOSEC) 20 MG capsule Take 20 mg by mouth 2 (two) times a week. Sundays & Wednesdays in the morning     rivaroxaban (XARELTO) 10 MG TABS tablet Take 10 mg by mouth every evening.       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  He has markedly limited motion of the right shoulder secondary to pain and generalized weakness as noted at his recent office visit.  Imaging studies confirm changes consistent with a chronic right shoulder rotator cuff tear arthropathy with significant degenerative chondrosis.  Vitals     Assessment/Plan  Impression: Right shoulder rotator cuff tear arthropathy  Plan of Action: Procedure(s): REVERSE SHOULDER ARTHROPLASTY  James Barber 12/27/2023, 5:52 AM Contact # [phone]

## 2023-12-27 NOTE — Anesthesia Procedure Notes (Signed)
Anesthesia Regional Block: Interscalene brachial plexus block   Pre-Anesthetic Checklist: , timeout performed,  Correct Patient, Correct Site, Correct Laterality,  Correct Procedure, Correct Position, site marked,  Risks and benefits discussed,  Surgical consent,  Pre-op evaluation,  At surgeon's request and post-op pain management  Laterality: Upper and Right  Prep: Maximum Sterile Barrier Precautions used, chloraprep       Needles:  Injection technique: Single-shot  Needle Type: Echogenic Needle     Needle Length: 5cm  Needle Gauge: 21     Additional Needles:   Procedures:,,,, ultrasound used (permanent image in chart),,    Narrative:  Start time: 12/27/2023 7:10 AM End time: 12/27/2023 7:15 AM Injection made incrementally with aspirations every 5 mL.  Performed by: Personally  Anesthesiologist: Trevor Iha, MD  Additional Notes: Block assessed prior to procedure. Patient tolerated procedure well.

## 2023-12-27 NOTE — Evaluation (Signed)
Occupational Therapy Evaluation Patient Details Name: James Barber MRN: [id number] DOB: Sep 29, 1946 Today's Date: 12/27/2023   History of Present Illness 78 yo s/p reverse TSA. 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   Clinical Impression   PTA pt lives independently with wife who assists with ADL and IADL tasks as needed due to deficits from previous CVA and back pain. Education completed regarding compensatory strategies for ADL tasks and functional mobility, management of sling, RUE ROM per specified parameters in the order set as indicated below, positioning of operative arm in sitting and supine and edema control, including use of "Iceman" Cold Therapy machine. Caregiver present for education, written handouts provided and reviewed using Teach Back and pt/caregiver verbalized/demonstrated understanding. Due to the below listed deficits, pt requires mod assistance with ADL tasks and min assist with functional mobility. Caregiver will be able to provide necessary level of assistance at discharge. Pt to follow up with MD to progress rehab of the operative shoulder.         If plan is discharge home, recommend the following: A little help with walking and/or transfers;A lot of help with bathing/dressing/bathroom;Assist for transportation    Functional Status Assessment  Patient has had a recent decline in their functional status and demonstrates the ability to make significant improvements in function in a reasonable and predictable amount of time.  Equipment Recommendations  None recommended by OT    Recommendations for Other Services       Precautions / Restrictions Precautions Precautions: Fall;Shoulder; Shoulder FF 0-60; ER 0-20; Abd 0-45. ROM for ADL purposes only; AROM elbow/wrist/hand; OK for lap slides and pendulums; Loosen sling from neck when arm is supported in sitting  Shoulder Interventions: Shoulder  sling/immobilizer;Off for dressing/bathing/exercises;At all times Precaution Booklet Issued: Yes (comment) Restrictions Weight Bearing Restrictions Per Provider Order: Yes RUE Weight Bearing Per Provider Order: Non weight bearing      Mobility Bed Mobility               General bed mobility comments: Educated on options for bed mobility; pt has an adjustable bed    Transfers Overall transfer level: Needs assistance   Transfers: Sit to/from Stand Sit to Stand: Min assist           General transfer comment: posterior lean noted initially; improves with increased mobility; recommended use of gait belt and ambulating on pt's L side;wife verbalized understanding.      Balance Overall balance assessment: Mild deficits observed, not formally tested (baseline balance deficits)                                         ADL either performed or assessed with clinical judgement   ADL Overall ADL's : Needs assistance/impaired Eating/Feeding: Set up   Grooming: Minimal assistance   Upper Body Bathing: Moderate assistance   Lower Body Bathing: Moderate assistance   Upper Body Dressing : Moderate assistance   Lower Body Dressing: Moderate assistance   Toilet Transfer: Minimal assistance   Toileting- Clothing Manipulation and Hygiene: Moderate assistance    Per orders, R shoulder parameters as follows for ADL tasks: Abd 0-45; ER 0-20; FF 0-60. While moving within specified parameters, pt/caregiver instructed on bathing and how to donn/doff shirt, placing operative arm through sleeve first when donning and off last when doffing.Pt/caregiver educated on compensatory strategies for LB  ADL and strategies to reduce risk of falls.  Pt/caregiver educated on donning/doffing sling and to wear the sling at all times with the exception of ADL, and to loosen the neck strap of the sling when the operative arm is in a supported position when sitting. In sitting or supine, pt  instructed to have a pillow behind and under their operative arm to provide support. If assist needed with ambulation, caregiver educated on the importance of walking on pt's non-operative side.  Education regarding use of "IceMan" Cold Therapy completed, including the importance of using a barrier on the shoulder prior to positioning the wrap-on pad. Pt/caregiver verbalized/demonstrated understanding. Teach Back used while caregiver assisted with dressing pt and positioning "wrap-on pad" to facilitate DC.    Functional mobility during ADLs: Minimal assistance       Vision Baseline Vision/History: 1 Wears glasses       Perception         Praxis         Pertinent Vitals/Pain Pain Assessment Pain Assessment: 0-10 Pain Score: 2  Pain Location: R shoulder Pain Descriptors / Indicators: Pressure Pain Intervention(s): Limited activity within patient's tolerance, Ice applied     Extremity/Trunk Assessment Upper Extremity Assessment Upper Extremity Assessment: Right hand dominant;RUE deficits/detail RUE Deficits / Details: block active RUE' AA/PROM elbow/wrist/hand WFL within baseline limitations; hx of hand dysfunction from neck issues per pt/wife; intrinsic atrophy of hadn noted; uses R UE as a functional assist RUE Coordination: decreased fine motor;decreased gross motor   Lower Extremity Assessment Lower Extremity Assessment: Overall WFL for tasks assessed (hx of limitations at baseline)   Cervical / Trunk Assessment Cervical / Trunk Assessment: Back Surgery;Neck Surgery (previous)   Communication Communication Communication: Hearing impairment   Cognition Arousal: Alert Behavior During Therapy: Flat affect Overall Cognitive Status: History of cognitive impairments - at baseline                                       General Comments       Exercises Exercises: Shoulder Shoulder Exercises Pendulum Exercise: PROM (dangle in supported standing or seated  position) Elbow Flexion: AAROM, 5 reps, Right Elbow Extension: AAROM, Right, 5 reps Wrist Flexion: AAROM, Right, 5 reps Wrist Extension: AAROM, Right, 5 reps Digit Composite Flexion: AAROM, Right, 5 reps Composite Extension: AAROM, Right, 5 reps Neck Flexion: AROM, 5 reps Neck Extension: AROM, 5 reps Neck Lateral Flexion - Right: AROM, 5 reps Neck Lateral Flexion - Left: AROM, 5 reps   Shoulder Instructions Shoulder Instructions Donning/doffing shirt without moving shoulder: Caregiver independent with task Method for sponge bathing under operated UE: Caregiver independent with task Donning/doffing sling/immobilizer: Caregiver independent with task Correct positioning of sling/immobilizer: Caregiver independent with task Pendulum exercises (written home exercise program): Caregiver independent with task;Patient able to independently direct caregiver ROM for elbow, wrist and digits of operated UE: Caregiver independent with task;Patient able to independently direct caregiver Sling wearing schedule (on at all times/off for ADL's): Caregiver independent with task;Patient able to independently direct caregiver Proper positioning of operated UE when showering: Caregiver independent with task;Patient able to independently direct caregiver Positioning of UE while sleeping: Caregiver independent with task;Patient able to independently direct caregiver    Home Living Family/patient expects to be discharged to:: Private residence Living Arrangements: Spouse/significant other Available Help at Discharge: Family;Available 24 hours/day Type of Home: House Home Access: Stairs to enter [employer] of Steps:  3 Entrance Stairs-Rails: Can reach both Home Layout: Able to live on main level with bedroom/bathroom     Bathroom Shower/Tub: Chief Strategy Officer: Handicapped height Bathroom Accessibility: Yes How Accessible: Accessible via walker Home Equipment: Cane - single  point;Shower seat;Grab bars - toilet;Grab bars - tub/shower          Prior Functioning/Environment Prior Level of Function : Independent/Modified Independent             Mobility Comments: unsteady gait at baseline ADLs Comments: wife assists with LB ADL if needed due to back problems        OT Problem List: Decreased strength;Decreased range of motion;Impaired balance (sitting and/or standing);Decreased safety awareness;Decreased knowledge of precautions;Impaired UE functional use      OT Treatment/Interventions:      OT Goals(Current goals can be found in the care plan section) Acute Rehab OT Goals Patient Stated Goal: home today OT Goal Formulation: All assessment and education complete, DC therapy  OT Frequency:      Co-evaluation              AM-PAC OT "6 Clicks" Daily Activity     Outcome Measure Help from another person eating meals?: A Little Help from another person taking care of personal grooming?: A Little Help from another person toileting, which includes using toliet, bedpan, or urinal?: A Lot Help from another person bathing (including washing, rinsing, drying)?: A Lot Help from another person to put on and taking off regular upper body clothing?: A Lot Help from another person to put on and taking off regular lower body clothing?: A Lot 6 Click Score: 14   End of Session Equipment Utilized During Treatment: Gait belt Nurse Communication: Other (comment) (ready to DC)  Activity Tolerance: Patient tolerated treatment well Patient left: in chair;with call bell/phone within reach;with family/visitor present  OT Visit Diagnosis: Unsteadiness on feet (R26.81);Muscle weakness (generalized) (M62.81)                Time: 1100-1149 OT Time Calculation (min): 49 min Charges:  OT General Charges $OT Visit: 1 Visit OT Evaluation $OT Eval Low Complexity: 1 Low OT Treatments $Self Care/Home Management : 8-22 mins $Therapeutic Activity: 8-22  mins  Luisa Dago, OT/L   Acute OT Clinical Specialist Acute Rehabilitation Services Pager [phone] Office [phone]  [facility] 12/27/2023, 2:20 PM

## 2023-12-28 ENCOUNTER — Encounter (HOSPITAL_COMMUNITY): Payer: Self-pay | Admitting: Orthopedic Surgery

## 2023-12-31 ENCOUNTER — Ambulatory Visit (HOSPITAL_BASED_OUTPATIENT_CLINIC_OR_DEPARTMENT_OTHER): Payer: Medicare Other | Admitting: Physical Therapy

## 2024-01-02 ENCOUNTER — Ambulatory Visit (HOSPITAL_BASED_OUTPATIENT_CLINIC_OR_DEPARTMENT_OTHER): Payer: Medicare Other | Admitting: Physical Therapy

## 2024-01-02 NOTE — Therapy (Incomplete)
OUTPATIENT PHYSICAL THERAPY SHOULDER EVALUATION   Patient Name: James Barber MRN: [id number] DOB:[birth year], 78 y.o., male Today's Date: 01/02/2024  END OF SESSION:   Past Medical History:  Diagnosis Date   Arthritis    Bowel obstruction (HCC)    Cancer (HCC)    prostate- "everything removed and clear"   Complication of anesthesia    Hard to wake and spikes fever   Diabetes mellitus without complication (HCC)    GERD (gastroesophageal reflux disease)    pt has not had issues with this in "a long time"   History of pulmonary embolus (PE)    Hypertension    Hypothyroidism    Ischemic stroke (HCC) 12/2022   Sleep apnea    Past Surgical History:  Procedure Laterality Date   ABDOMINAL SURGERY     "had ileostomy for about 4 months and had it reversed- due to salmonella infection- [place]   ANTERIOR CERVICAL DECOMP/DISCECTOMY FUSION  11/12/2012   Procedure: ANTERIOR CERVICAL DECOMPRESSION/DISCECTOMY FUSION 2 LEVELS;  Surgeon: Barnett Abu, MD;  Location: [facility];  Service: Neurosurgery;  Laterality: N/A;  Right Side approach Cervical six-seven,Cervical seven -thoracic one Anterior cervical decompression/diskectomy/fusion   BACK SURGERY  2023   x2 (outpatient with Dr. Danielle Dess)   CARPAL TUNNEL RELEASE Bilateral    CERVICAL FUSION     c 3-4, C 4-5, C5-6   HERNIA REPAIR     right inguinal   LAPAROTOMY N/A 03/22/2015   Procedure: DIAGONOSTIC LAPAROSCOPY, LAPROSCOPIC LYSIS OF ADHESIONS FOR ONE HOUR, DISTAL SMALL BOWEL RESECTION;  Surgeon: [name], MD;  Location: [facility];  Service: General;  Laterality: N/A;   REVERSE SHOULDER ARTHROPLASTY Right 12/27/2023   Procedure: REVERSE SHOULDER ARTHROPLASTY;  Surgeon: Francena Hanly, MD;  Location: [facility];  Service: Orthopedics;  Laterality: Right;    Patient Active Problem List   Diagnosis Date Noted   Spondylolisthesis at L4-L5 level 05/07/2023   Acute pulmonary embolus (HCC) 04/19/2015   Rash and  nonspecific skin eruption 04/19/2015   Diabetes mellitus, type II (HCC) 03/30/2015   Elevated LFTs 03/30/2015   Abnormal liver function tests 03/30/2015   Incisional hernia, without obstruction or gangrene 03/21/2015   Recurrent SBO (small bowel obstruction) 03/15/2015   HYPERLIPIDEMIA 03/09/2008   Iron deficiency anemia 03/09/2008   GERD 03/09/2008   Bilateral inguinal hernia (BIH), Left > Right 03/09/2008    PCP: ***  REFERRING PROVIDER: ***  REFERRING DIAG: ***  THERAPY DIAG:  No diagnosis found.  Rationale for Evaluation and Treatment: {HABREHAB:27488}  ONSET DATE: ***  SUBJECTIVE:  SUBJECTIVE STATEMENT: *** Hand dominance: {MISC; OT HAND [phone]}  PERTINENT HISTORY: R shoulder reverse arthroplasty on 12/27/23  L4-S1 fusion on 05/07/2023.  Pt has B/L/T restrictions.   Pt has had 2 prior lumbar surgeries.   MRI indicating a RTC tear, arthritis, and a bone spur per Pt's wife.  Pt is planning to have a R reverse shoulder replacement.   Hx of 5 cervical surgeries including cervical fusion C2-T1.   DM type 2, peripheral neuropathy in bilat feet   CVA 12/2022--R sided visual deficits, some recall/processing deficits   Hx of PE in 2016 and 2021 HTN  PAIN:  Are you having pain? {OPRCPAIN:27236}  PRECAUTIONS: {Therapy precautions:24002}  RED FLAGS: {PT Red Flags:29287}   WEIGHT BEARING RESTRICTIONS: {Yes ***/No:24003}  FALLS:  Has patient fallen in last 6 months? {fallsyesno:27318}  LIVING ENVIRONMENT: Lives with: {OPRC lives with:25569::"lives with their family"} Lives in: {Lives in:25570} Stairs: {opstairs:27293} Has following equipment at home: {Assistive devices:23999}  OCCUPATION: ***  PLOF: {PLOF:24004}  PATIENT GOALS:***  NEXT MD VISIT:   OBJECTIVE:   Note: Objective measures were completed at Evaluation unless otherwise noted.  DIAGNOSTIC FINDINGS:  ***  PATIENT SURVEYS:  {rehab surveys:24030:a}  COGNITION: Overall cognitive status: {cognition:24006}     SENSATION: {sensation:27233}  POSTURE: ***  UPPER EXTREMITY ROM:   {AROM/PROM:27142} ROM Right eval Left eval  Shoulder flexion    Shoulder extension    Shoulder abduction    Shoulder adduction    Shoulder internal rotation    Shoulder external rotation    Elbow flexion    Elbow extension    Wrist flexion    Wrist extension    Wrist ulnar deviation    Wrist radial deviation    Wrist pronation    Wrist supination    (Blank rows = not tested)  UPPER EXTREMITY MMT:  MMT Right eval Left eval  Shoulder flexion    Shoulder extension    Shoulder abduction    Shoulder adduction    Shoulder internal rotation    Shoulder external rotation    Middle trapezius    Lower trapezius    Elbow flexion    Elbow extension    Wrist flexion    Wrist extension    Wrist ulnar deviation    Wrist radial deviation    Wrist pronation    Wrist supination    Grip strength (lbs)    (Blank rows = not tested)  SHOULDER SPECIAL TESTS: Impingement tests: {shoulder impingement test:25231:a} SLAP lesions: {SLAP lesions:25232} Instability tests: {shoulder instability test:25233} Rotator cuff assessment: {rotator cuff assessment:25234} Biceps assessment: {biceps assessment:25235}  JOINT MOBILITY TESTING:  ***  PALPATION:  ***                                                                                                                             TREATMENT DATE: ***   PATIENT EDUCATION: Education details: *** Person educated: {Person educated:25204} Education method: {Education Method:25205} Education comprehension: {Education Comprehension:25206}  HOME EXERCISE PROGRAM: ***  ASSESSMENT:  CLINICAL IMPRESSION: Patient is a *** y.o. *** 6 days s/p R shoulder  reverse arthroplasty   OBJECTIVE IMPAIRMENTS: {opptimpairments:25111}.   ACTIVITY LIMITATIONS: {activitylimitations:27494}  PARTICIPATION LIMITATIONS: {participationrestrictions:25113}  PERSONAL FACTORS: {Personal factors:25162} are also affecting patient's functional outcome.   REHAB POTENTIAL: {rehabpotential:25112}  CLINICAL DECISION MAKING: {clinical decision making:25114}  EVALUATION COMPLEXITY: {Evaluation complexity:25115}   GOALS: Goals reviewed with patient? {yes/no:20286}  SHORT TERM GOALS: Target date: ***  *** Baseline: Goal status: INITIAL  2.  *** Baseline:  Goal status: INITIAL  3.  *** Baseline:  Goal status: INITIAL  4.  *** Baseline:  Goal status: INITIAL  5.  *** Baseline:  Goal status: INITIAL  6.  *** Baseline:  Goal status: INITIAL  LONG TERM GOALS: Target date: ***  *** Baseline:  Goal status: INITIAL  2.  *** Baseline:  Goal status: INITIAL  3.  *** Baseline:  Goal status: INITIAL  4.  *** Baseline:  Goal status: INITIAL  5.  *** Baseline:  Goal status: INITIAL  6.  *** Baseline:  Goal status: INITIAL  PLAN:  PT FREQUENCY: {rehab frequency:25116}  PT DURATION: {rehab duration:25117}  PLANNED INTERVENTIONS: {rehab planned interventions:25118::"97110-Therapeutic exercises","97530- Therapeutic [phone]- Neuromuscular re-education","97535- Self YNWG","95621- Manual therapy"}  PLAN FOR NEXT SESSION: Aaron Edelman, PT 01/02/2024, 7:20 AM

## 2024-01-03 ENCOUNTER — Ambulatory Visit (HOSPITAL_BASED_OUTPATIENT_CLINIC_OR_DEPARTMENT_OTHER): Payer: Medicare Other | Admitting: Physical Therapy

## 2024-01-15 ENCOUNTER — Ambulatory Visit (HOSPITAL_BASED_OUTPATIENT_CLINIC_OR_DEPARTMENT_OTHER): Payer: Medicare Other | Admitting: Physical Therapy

## 2024-01-23 ENCOUNTER — Ambulatory Visit (HOSPITAL_BASED_OUTPATIENT_CLINIC_OR_DEPARTMENT_OTHER): Payer: Medicare Other | Admitting: Physical Therapy

## 2024-01-28 NOTE — Therapy (Incomplete)
 OUTPATIENT PHYSICAL THERAPY SHOULDER EVALUATION   Patient Name: James Barber MRN: [id number] DOB:[birth year], 78 y.o., male Today's Date: 01/30/2024  END OF SESSION:  PT End of Session - 01/29/24 0942     Visit Number 1    Number of Visits 24    Date for PT Re-Evaluation 04/22/24    Authorization Type BCBS MCR    PT Start Time 0935    PT Stop Time 1031    PT Time Calculation (min) 56 min    Activity Tolerance Patient tolerated treatment well    Behavior During Therapy WFL for tasks assessed/performed             Past Medical History:  Diagnosis Date   Arthritis    Bowel obstruction (HCC)    Cancer (HCC)    prostate- "everything removed and clear"   Complication of anesthesia    Hard to wake and spikes fever   Diabetes mellitus without complication (HCC)    GERD (gastroesophageal reflux disease)    pt has not had issues with this in "a long time"   History of pulmonary embolus (PE)    Hypertension    Hypothyroidism    Ischemic stroke (HCC) 12/2022   Sleep apnea    Past Surgical History:  Procedure Laterality Date   ABDOMINAL SURGERY     "had ileostomy for about 4 months and had it reversed- due to salmonella infection- [place]   ANTERIOR CERVICAL DECOMP/DISCECTOMY FUSION  11/12/2012   Procedure: ANTERIOR CERVICAL DECOMPRESSION/DISCECTOMY FUSION 2 LEVELS;  Surgeon: Barnett Abu, MD;  Location: [facility];  Service: Neurosurgery;  Laterality: N/A;  Right Side approach Cervical six-seven,Cervical seven -thoracic one Anterior cervical decompression/diskectomy/fusion   BACK SURGERY  2023   x2 (outpatient with Dr. Danielle Dess)   CARPAL TUNNEL RELEASE Bilateral    CERVICAL FUSION     c 3-4, C 4-5, C5-6   HERNIA REPAIR     right inguinal   LAPAROTOMY N/A 03/22/2015   Procedure: DIAGONOSTIC LAPAROSCOPY, LAPROSCOPIC LYSIS OF ADHESIONS FOR ONE HOUR, DISTAL SMALL BOWEL RESECTION;  Surgeon: [name], MD;  Location: [facility];  Service: General;   Laterality: N/A;   REVERSE SHOULDER ARTHROPLASTY Right 12/27/2023   Procedure: REVERSE SHOULDER ARTHROPLASTY;  Surgeon: Francena Hanly, MD;  Location: [facility];  Service: Orthopedics;  Laterality: Right;    Patient Active Problem List   Diagnosis Date Noted   Spondylolisthesis at L4-L5 level 05/07/2023   Acute pulmonary embolus (HCC) 04/19/2015   Rash and nonspecific skin eruption 04/19/2015   Diabetes mellitus, type II (HCC) 03/30/2015   Elevated LFTs 03/30/2015   Abnormal liver function tests 03/30/2015   Incisional hernia, without obstruction or gangrene 03/21/2015   Recurrent SBO (small bowel obstruction) 03/15/2015   HYPERLIPIDEMIA 03/09/2008   Iron deficiency anemia 03/09/2008   GERD 03/09/2008   Bilateral inguinal hernia (BIH), Left > Right 03/09/2008     REFERRING PROVIDER: Francena Hanly, MD   REFERRING DIAG: ICD-10:  Z96.611  Presence of right artificial shoulder joint  THERAPY DIAG:  Right shoulder pain, unspecified chronicity  Stiffness of right shoulder, not elsewhere classified  Muscle weakness (generalized)  Rationale for Evaluation and Treatment: Rehabilitation  ONSET DATE: DOS  12/27/2023  SUBJECTIVE:  SUBJECTIVE STATEMENT: Pt states his R shoulder began hurting 2-3 years ago with no specific MOI.  His pain progressively worsened and he has avoided using his R UE due to pain.  Pt underwent R shoulder reverse arthroplasty on 12/27/23.  PT order indicates per protocol R shoulder reverse total shoulder arthroplasty and PT to begin around 4 weeks.  MD office faxed over MD's protocol.  Pt states MD informed him to use the sling as needed.   Pt states his shoulder is hurting a lot.  Pt has difficulty with ADLs/IADLs including bathing, dressing, and performing hair care.  Pt states he  has had some memory difficulty though it's about the same as it was prior to surgery.  He has had memory difficulty since the CVA last year.  He has seen surgeon  about this.    Hand dominance: Right  PERTINENT HISTORY: R reverse total shoulder arthroplasty  12/27/23  L4-S1 fusion on 05/07/2023.  Pt has had 2 prior lumbar surgeries.   Hx of 5 cervical surgeries including cervical fusion C2-T1.   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  Memory difficulty   Hx of PE in 2016 and 2021 HTN  PAIN:  NPRS:  Current: 6/10, Worst:  8-9/10, Best:  3/10 Location:  R shoulder primarily anterior and lateral.  PRECAUTIONS: Other: Reverse shoulder arthoplasty protocol, Lumbar fusion, peripheral neuropathy, hx of cervical fusion, CVA   WEIGHT BEARING RESTRICTIONS: Yes R UE  FALLS:  Has patient fallen in last 6 months? No  LIVING ENVIRONMENT: Lives with: lives with their family Lives in: 2 story home, but stays on 1st floor mainly Stairs: yes  OCCUPATION: Pt is retired.  PLOF: Independent  PATIENT GOALS:  to not have pain.  To have full use of R UE.   NEXT MD VISIT:   OBJECTIVE:  Note: Objective measures were completed at Evaluation unless otherwise noted.  DIAGNOSTIC FINDINGS:  Pt is post op.  PATIENT SURVEYS:  UEFI:  36/80  COGNITION: Overall cognitive status: Within functional limits for tasks assessed.  Pt has some recall/processing deficits and reports having some memory difficulty.        OBSERVATION: Incision is intact, closed with a normal pink color.  Incision has no signs of infection.  Pt has bruising in lateral UE and ant distal biceps  Pt has limitations in finger/hand ROM and muscle atrophy in R hand due to nerve issues years ago prior to cervical fusion.  UPPER EXTREMITY ROM:    ROM Right eval Left eval  Shoulder flexion 90 PROM / 82 AAROM 169 AROM  Shoulder scaption  164 AROM   Shoulder abduction 65 PROM 158 AROM  Shoulder adduction    Shoulder internal rotation    Shoulder external rotation 16 PROM   Elbow flexion    Elbow extension    Wrist flexion    Wrist extension    Wrist ulnar deviation    Wrist radial deviation    Wrist pronation    Wrist supination    (Blank rows = not tested)  UPPER EXTREMITY MMT:  NT due to healing constraints and surgical protocol   PALPATION:  Pt has tenderness with palpation t/o R shoulder which is worse ant and lateral than posterior.  Pt has some tenderness in mid to distal biceps.  TREATMENT DATE:   Pt performed elbow flex/ext AROM/AAROM 2x10/1x10 respectively and wrist flexion approx 10-12 reps and wrist extension approx 10-12 reps.  He states he could feel it in his anterior shoulder.  Pt performed wrist flexion/ext AROM with forearm in neutral position approx 15 reps. Pt performed pendulums though unable to demo correct form.  Pt unable to perform towel gripping well due to hand.  He performed supine flexion AAROM with wrist grasped x 10 reps.  PT instructed pt in appropriate ROM including ROM limits per protocol.  PT instructed pt to not perform exercise into a painful or tight range.    PATIENT EDUCATION: Education details: HEP, exercise form, post op and protocol limitations and restrictions, dx, relevant anatomy, objective findings, and POC.   Person educated: Patient Education method: Explanation, Demonstration, Tactile cues, Verbal cues, and Handouts Education comprehension: verbalized understanding, returned demonstration, verbal cues required, tactile cues required, and needs further education  HOME EXERCISE PROGRAM: Access Code: [facility] URL: [email or web address] Date: 01/29/2024 Prepared by: Aaron Edelman  Exercises - Wrist AROM Flexion Extension  - 2 x daily - 7  x weekly - 2 sets - 10 reps - Supine Shoulder Flexion AAROM  - 2 x daily - 7 x weekly - 1-2 sets - 5-10 reps   ASSESSMENT:  CLINICAL IMPRESSION: Patient is a 78 y.o. male 4 weeks and 5 days s/p R reverse total shoulder arthroplasty presenting to the clinic with expected post op limitations of R shoulder pain, limited ROM in R shoulder, and muscle weakness in R UE.  Pt states his shoulder is hurting a lot.  He has difficulty with ADLs/IADLs including bathing, dressing, and performing hair care.  Pt is limited per protocol and is unable to perform his normal reaching activities.  Pt should benefit from skilled PT services per protocol to address impairments and improve overall function.    OBJECTIVE IMPAIRMENTS: decreased activity tolerance, decreased ROM, decreased strength, hypomobility, impaired flexibility, impaired UE functional use, and pain.   ACTIVITY LIMITATIONS: carrying, lifting, bathing, dressing, reach over head, and hygiene/grooming  PARTICIPATION LIMITATIONS: meal prep, cleaning, laundry, shopping, and community activity  PERSONAL FACTORS: 3+ comorbidities: L4-S1 fusion on 05/07/2023, cervical fusion, CVA, DM Type 2, peripheral neuropathy  are also affecting patient's functional outcome.   are also affecting patient's functional outcome.   REHAB POTENTIAL: Good  CLINICAL DECISION MAKING: Stable/uncomplicated  EVALUATION COMPLEXITY: Low   GOALS:   SHORT TERM GOALS: Target date:  02/26/24   Pt will be independent with HEP for improved pain, ROM, and function.   Baseline: Goal status: INITIAL  2.  Pt will progress with PROM per protocol and demo 115 deg in flexion, 90 deg in abd, and 45 deg in ER for improved mobility and stiffness.  Baseline:  Goal status: INITIAL  3.  Pt will progress with shoulder AAROM per protocol without adverse effects for improved shoulder mobility and performance of daily activities.  Baseline:  Goal status: INITIAL  4.  Pt will demo R  shoulder flexion AAROM in supine to 130 deg and ER AAROM to 40 deg for improved stiffness and mobility.   Baseline:  Goal status: INITIAL Target date:  03/11/24   5.  Pt will be able to actively elevate R shoulder > 90 deg without significant shoulder hike.  Baseline:  Goal status: INITIAL Target date:  03/25/24   6.  Pt will be able to perform her self care activities with no > than minimal difficulty and without significant pain.  Baseline:  Goal status: INITIAL Target date:  03/25/2024   LONG TERM GOALS: Target date: 04/22/2024   Pt will be able to perform his ADLs and IADLs without significant difficulty and pain.  Baseline:  Goal status: INITIAL  2.  Pt will be able to perform his normal reaching and functional overhead activities without significant pain and limitation.  Baseline:  Goal status: INITIAL  3.   Pt will demo L shoulder AROM to be [facility] t/o for performance of ADLs and IADLs.  Baseline:  Goal status: INITIAL  4.  Pt will be able to reach into an overhead cabinet without significant difficulty.  Baseline:  Goal status: INITIAL  5.  Pt will progress with strengthening exercises per protocol without adverse effects in order to perform his normal functional lifting/carrying activities and household chores.   Baseline:  Goal status: INITIAL   PLAN:  PT FREQUENCY: 2x/week  PT DURATION: 12 weeks  PLANNED INTERVENTIONS: 97164- PT Re-evaluation, 97110-Therapeutic exercises, 97530- Therapeutic activity, 97112- Neuromuscular re-education, 97535- Self Care, [id number]- Manual therapy, [phone]- Aquatic Therapy, [id number]- Electrical stimulation (unattended), Patient/Family education, Taping, Dry Needling, Joint mobilization, Scar mobilization, Cryotherapy, and Moist heat  PLAN FOR NEXT SESSION: Cont per Dr. Dub Mikes Reverse Total Shoulder Arthroplasty protocol.  Review and perform HEP.  Work on correct form with pendulums   Audie Clear III PT, DPT 01/30/24 2:25 PM

## 2024-01-29 ENCOUNTER — Ambulatory Visit (HOSPITAL_BASED_OUTPATIENT_CLINIC_OR_DEPARTMENT_OTHER): Payer: Medicare Other | Attending: Neurological Surgery | Admitting: Physical Therapy

## 2024-01-29 ENCOUNTER — Other Ambulatory Visit: Payer: Self-pay

## 2024-01-29 ENCOUNTER — Encounter (HOSPITAL_BASED_OUTPATIENT_CLINIC_OR_DEPARTMENT_OTHER): Payer: Self-pay | Admitting: Physical Therapy

## 2024-01-29 DIAGNOSIS — M6281 Muscle weakness (generalized): Secondary | ICD-10-CM | POA: Diagnosis present

## 2024-01-29 DIAGNOSIS — M25611 Stiffness of right shoulder, not elsewhere classified: Secondary | ICD-10-CM | POA: Diagnosis present

## 2024-01-29 DIAGNOSIS — M25511 Pain in right shoulder: Secondary | ICD-10-CM | POA: Diagnosis present

## 2024-02-06 ENCOUNTER — Ambulatory Visit (HOSPITAL_BASED_OUTPATIENT_CLINIC_OR_DEPARTMENT_OTHER): Attending: Neurological Surgery | Admitting: Physical Therapy

## 2024-02-06 DIAGNOSIS — M25611 Stiffness of right shoulder, not elsewhere classified: Secondary | ICD-10-CM | POA: Insufficient documentation

## 2024-02-06 DIAGNOSIS — M25511 Pain in right shoulder: Secondary | ICD-10-CM | POA: Diagnosis present

## 2024-02-06 DIAGNOSIS — M6281 Muscle weakness (generalized): Secondary | ICD-10-CM | POA: Insufficient documentation

## 2024-02-06 NOTE — Therapy (Addendum)
 OUTPATIENT PHYSICAL THERAPY SHOULDER TREATMENT   Patient Name: James Barber MRN: [id number] DOB:01-25-1946, 78 y.o., male Today's Date: 02/07/2024  END OF SESSION:  PT End of Session - 02/06/24     Visit Number 2    Number of Visits 24    Date for PT Re-Evaluation 04/22/24    Authorization Type BCBS MCR    PT Start Time 1622    PT Stop Time 1701    PT Time Calculation (min) 39 min    Activity Tolerance Patient tolerated treatment well    Behavior During Therapy WFL for tasks assessed/performed             Past Medical History:  Diagnosis Date   Arthritis    Bowel obstruction (HCC)    Cancer (HCC)    prostate- "everything removed and clear"   Complication of anesthesia    Hard to wake and spikes fever   Diabetes mellitus without complication (HCC)    GERD (gastroesophageal reflux disease)    pt has not had issues with this in "a long time"   History of pulmonary embolus (PE)    Hypertension    Hypothyroidism    Ischemic stroke (HCC) 12/2022   Sleep apnea    Past Surgical History:  Procedure Laterality Date   ABDOMINAL SURGERY     "had ileostomy for about 4 months and had it reversed- due to salmonella infection- this happened in [place]"   ANTERIOR CERVICAL DECOMP/DISCECTOMY FUSION  11/12/2012   Procedure: ANTERIOR CERVICAL DECOMPRESSION/DISCECTOMY FUSION 2 LEVELS;  Surgeon: Barnett Abu, MD;  Location: [facility];  Service: Neurosurgery;  Laterality: N/A;  Right Side approach Cervical six-seven,Cervical seven -thoracic one Anterior cervical decompression/diskectomy/fusion   BACK SURGERY  2023   x2 (outpatient with Dr. Danielle Dess)   CARPAL TUNNEL RELEASE Bilateral    CERVICAL FUSION     c 3-4, C 4-5, C5-6   HERNIA REPAIR     right inguinal   LAPAROTOMY N/A 03/22/2015   Procedure: DIAGONOSTIC LAPAROSCOPY, LAPROSCOPIC LYSIS OF ADHESIONS FOR ONE HOUR, DISTAL SMALL BOWEL RESECTION;  Surgeon: [name], MD;  Location: [facility];  Service: General;  Laterality: N/A;    REVERSE SHOULDER ARTHROPLASTY Right 12/27/2023   Procedure: REVERSE SHOULDER ARTHROPLASTY;  Surgeon: Francena Hanly, MD;  Location: [facility];  Service: Orthopedics;  Laterality: Right;    Patient Active Problem List   Diagnosis Date Noted   Spondylolisthesis at L4-L5 level 05/07/2023   Acute pulmonary embolus (HCC) 04/19/2015   Rash and nonspecific skin eruption 04/19/2015   Diabetes mellitus, type II (HCC) 03/30/2015   Elevated LFTs 03/30/2015   Abnormal liver function tests 03/30/2015   Incisional hernia, without obstruction or gangrene 03/21/2015   Recurrent SBO (small bowel obstruction) 03/15/2015   HYPERLIPIDEMIA 03/09/2008   Iron deficiency anemia 03/09/2008   GERD 03/09/2008   Bilateral inguinal hernia (BIH), Left > Right 03/09/2008     REFERRING PROVIDER: Francena Hanly, MD   REFERRING DIAG: ICD-10:  Z96.611  Presence of right artificial shoulder joint  THERAPY DIAG:  Right shoulder pain, unspecified chronicity  Stiffness of right shoulder, not elsewhere classified  Muscle weakness (generalized)  Rationale for Evaluation and Treatment: Rehabilitation  ONSET DATE: DOS  12/27/2023  SUBJECTIVE:  SUBJECTIVE STATEMENT: Pt is 5 weeks and 6 days s/p R reverse total shoulder arthroplasty.  Pt states his shoulder feels about the same.  He denies any adverse effects after prior Rx.  Pt has been performing HEP.  He reports 3/10 pain at rest and 5-6/10 pain with movement.   Hand dominance: Right  PERTINENT HISTORY: R reverse total shoulder arthroplasty  12/27/23  L4-S1 fusion on 05/07/2023.  Pt has had 2 prior lumbar surgeries.   Hx of 5 cervical surgeries including cervical fusion C2-T1.   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  Memory difficulty   Hx of PE in 2016 and 2021 HTN  PAIN:  NPRS:  Current: 3/10, Worst:  8-9/10, Best:  3/10 Location:  R shoulder primarily anterior and lateral.  PRECAUTIONS: Other: Reverse shoulder arthoplasty protocol, Lumbar fusion, peripheral neuropathy, hx of cervical fusion, CVA   WEIGHT BEARING RESTRICTIONS: Yes R UE  FALLS:  Has patient fallen in last 6 months? No  LIVING ENVIRONMENT: Lives with: lives with their family Lives in: 2 story home, but stays on 1st floor mainly Stairs: yes  OCCUPATION: Pt is retired.  PLOF: Independent  PATIENT GOALS:  to not have pain.  To have full use of R UE.   NEXT MD VISIT:   OBJECTIVE:  Note: Objective measures were completed at Evaluation unless otherwise noted.  DIAGNOSTIC FINDINGS:  Pt is post op.   UPPER EXTREMITY ROM:    ROM Right eval Left eval Right 3/5  Shoulder flexion 90 PROM / 82 AAROM 169 AROM 107 PROM / 104-105 deg AAROM  Shoulder scaption  164 AROM   Shoulder abduction 65 PROM 158 AROM 79 PROM  Shoulder adduction     Shoulder internal rotation     Shoulder external rotation 16 PROM  34 PROM / 28 - 30 deg AAROM  Elbow flexion     Elbow extension     Wrist flexion     Wrist extension     Wrist ulnar deviation     Wrist radial deviation     Wrist pronation     Wrist supination     (Blank rows = not tested)                                                                                                                             TREATMENT DATE:   Pt received R shoulder PROM in flexion, abd, and ER per pt and tissue tolerance within protocol ranges. Assessed R shoulder ROM.  PT reviewed HEP and instructed pt in form with home exercises.   Pt performed: supine flexion AAROM with wrist grasped Supine wand flexion  Supine wand ER Seated pulleys in flexion 2x10   PATIENT EDUCATION: Education details: HEP, exercise form, post op and protocol limitations and  restrictions, dx, relevant anatomy, objective findings, and POC.   Person educated: Patient Education method: Explanation,  Demonstration, Tactile cues, Verbal cues, and Handouts Education comprehension: verbalized understanding, returned demonstration, verbal cues required, tactile cues required, and needs further education  HOME EXERCISE PROGRAM: Access Code: [facility] URL: [email or web address] Date: 01/29/2024 Prepared by: Aaron Edelman  Exercises - Wrist AROM Flexion Extension  - 2 x daily - 7 x weekly - 2 sets - 10 reps - Supine Shoulder Flexion AAROM  - 2 x daily - 7 x weekly - 1-2 sets - 5-10 reps   ASSESSMENT:  CLINICAL IMPRESSION: Pt performed exercises per protocol to improve shoulder ROM.  He requires cuing and instruction in correct form and positioning with exercises.  Pt states he feels more comfortable performing supine flexion AAROM with wrist grasped than with wand.  PT instructed pt to continue with supine flexion AAROM with wrist grasped at home.  Pt required cuing to continue to provide assistance on eccentric phase of flexion AAROM.  Pt demonstrates improved shoulder ROM in all directions.  He responded well to Rx having no increased pain after Rx.     OBJECTIVE IMPAIRMENTS: decreased activity tolerance, decreased ROM, decreased strength, hypomobility, impaired flexibility, impaired UE functional use, and pain.   ACTIVITY LIMITATIONS: carrying, lifting, bathing, dressing, reach over head, and hygiene/grooming  PARTICIPATION LIMITATIONS: meal prep, cleaning, laundry, shopping, and community activity  PERSONAL FACTORS: 3+ comorbidities: L4-S1 fusion on 05/07/2023, cervical fusion, CVA, DM Type 2, peripheral neuropathy  are also affecting patient's functional outcome.   are also affecting patient's functional outcome.   REHAB POTENTIAL: Good  CLINICAL DECISION MAKING: Stable/uncomplicated  EVALUATION COMPLEXITY: Low   GOALS:   SHORT TERM GOALS:  Target date:  02/26/24   Pt will be independent with HEP for improved pain, ROM, and function.   Baseline: Goal status: INITIAL  2.  Pt will progress with PROM per protocol and demo 115 deg in flexion, 90 deg in abd, and 45 deg in ER for improved mobility and stiffness.  Baseline:  Goal status: INITIAL  3.  Pt will progress with shoulder AAROM per protocol without adverse effects for improved shoulder mobility and performance of daily activities.  Baseline:  Goal status: INITIAL  4.  Pt will demo R shoulder flexion AAROM in supine to 130 deg and ER AAROM to 40 deg for improved stiffness and mobility.   Baseline:  Goal status: INITIAL Target date:  03/11/24   5.  Pt will be able to actively elevate R shoulder > 90 deg without significant shoulder hike.  Baseline:  Goal status: INITIAL Target date:  03/25/24   6.  Pt will be able to perform her self care activities with no > than minimal difficulty and without significant pain.  Baseline:  Goal status: INITIAL Target date:  03/25/2024   LONG TERM GOALS: Target date: 04/22/2024   Pt will be able to perform his ADLs and IADLs without significant difficulty and pain.  Baseline:  Goal status: INITIAL  2.  Pt will be able to perform his normal reaching and functional overhead activities without significant pain and limitation.  Baseline:  Goal status: INITIAL  3.   Pt will demo L shoulder AROM to be [facility] t/o for performance of ADLs and IADLs.  Baseline:  Goal status: INITIAL  4.  Pt will be able to reach into an overhead cabinet without significant difficulty.  Baseline:  Goal status: INITIAL  5.  Pt will progress with strengthening exercises per protocol without adverse effects in order to perform his normal functional lifting/carrying activities and household chores.  Baseline:  Goal status: INITIAL   PLAN:  PT FREQUENCY: 2x/week  PT DURATION: 12 weeks  PLANNED INTERVENTIONS: 97164- PT Re-evaluation,  97110-Therapeutic exercises, 97530- Therapeutic activity, 97112- Neuromuscular re-education, 97535- Self Care, [id number]- Manual therapy, [phone]- Aquatic Therapy, [id number]- Electrical stimulation (unattended), Patient/Family education, Taping, Dry Needling, Joint mobilization, Scar mobilization, Cryotherapy, and Moist heat  PLAN FOR NEXT SESSION: Cont per Dr. Dub Mikes Reverse Total Shoulder Arthroplasty protocol.  Review and perform HEP.  Work on correct form with pendulums   Audie Clear III PT, DPT 02/08/24 7:02 AM

## 2024-02-07 ENCOUNTER — Encounter (HOSPITAL_BASED_OUTPATIENT_CLINIC_OR_DEPARTMENT_OTHER): Payer: Self-pay | Admitting: Physical Therapy

## 2024-02-14 ENCOUNTER — Ambulatory Visit (HOSPITAL_BASED_OUTPATIENT_CLINIC_OR_DEPARTMENT_OTHER): Payer: Medicare Other | Admitting: Physical Therapy

## 2024-02-15 ENCOUNTER — Ambulatory Visit (HOSPITAL_BASED_OUTPATIENT_CLINIC_OR_DEPARTMENT_OTHER): Admitting: Physical Therapy

## 2024-02-15 ENCOUNTER — Encounter (HOSPITAL_BASED_OUTPATIENT_CLINIC_OR_DEPARTMENT_OTHER): Payer: Self-pay | Admitting: Physical Therapy

## 2024-02-15 DIAGNOSIS — M25511 Pain in right shoulder: Secondary | ICD-10-CM | POA: Diagnosis not present

## 2024-02-15 DIAGNOSIS — M25611 Stiffness of right shoulder, not elsewhere classified: Secondary | ICD-10-CM

## 2024-02-15 DIAGNOSIS — M6281 Muscle weakness (generalized): Secondary | ICD-10-CM

## 2024-02-15 NOTE — Therapy (Signed)
 OUTPATIENT PHYSICAL THERAPY SHOULDER TREATMENT   Patient Name: James Barber MRN: [id number] DOB:[birth year], 78 y.o., male Today's Date: 02/15/2024  END OF SESSION:  PT End of Session - 02/15/24     Visit Number 3   Number of Visits 24    Date for PT Re-Evaluation 04/22/24    Authorization Type BCBS MCR    PT Start Time 0937    PT Stop Time 1022    PT Time Calculation (min) 45 min    Activity Tolerance Patient tolerated treatment well    Behavior During Therapy WFL for tasks assessed/performed             Past Medical History:  Diagnosis Date   Arthritis    Bowel obstruction (HCC)    Cancer (HCC)    prostate- "everything removed and clear"   Complication of anesthesia    Hard to wake and spikes fever   Diabetes mellitus without complication (HCC)    GERD (gastroesophageal reflux disease)    pt has not had issues with this in "a long time"   History of pulmonary embolus (PE)    Hypertension    Hypothyroidism    Ischemic stroke (HCC) 12/2022   Sleep apnea    Past Surgical History:  Procedure Laterality Date   ABDOMINAL SURGERY     "had ileostomy for about 4 months and had it reversed- due to salmonella infection- [place]   ANTERIOR CERVICAL DECOMP/DISCECTOMY FUSION  11/12/2012   Procedure: ANTERIOR CERVICAL DECOMPRESSION/DISCECTOMY FUSION 2 LEVELS;  Surgeon: Barnett Abu, MD;  Location: [facility];  Service: Neurosurgery;  Laterality: N/A;  Right Side approach Cervical six-seven,Cervical seven -thoracic one Anterior cervical decompression/diskectomy/fusion   BACK SURGERY  2023   x2 (outpatient with Dr. Danielle Dess)   CARPAL TUNNEL RELEASE Bilateral    CERVICAL FUSION     c 3-4, C 4-5, C5-6   HERNIA REPAIR     right inguinal   LAPAROTOMY N/A 03/22/2015   Procedure: DIAGONOSTIC LAPAROSCOPY, LAPROSCOPIC LYSIS OF ADHESIONS FOR ONE HOUR, DISTAL SMALL BOWEL RESECTION;  Surgeon: [name], MD;  Location: [facility];  Service: General;  Laterality: N/A;    REVERSE SHOULDER ARTHROPLASTY Right 12/27/2023   Procedure: REVERSE SHOULDER ARTHROPLASTY;  Surgeon: Francena Hanly, MD;  Location: [facility];  Service: Orthopedics;  Laterality: Right;    Patient Active Problem List   Diagnosis Date Noted   Spondylolisthesis at L4-L5 level 05/07/2023   Acute pulmonary embolus (HCC) 04/19/2015   Rash and nonspecific skin eruption 04/19/2015   Diabetes mellitus, type II (HCC) 03/30/2015   Elevated LFTs 03/30/2015   Abnormal liver function tests 03/30/2015   Incisional hernia, without obstruction or gangrene 03/21/2015   Recurrent SBO (small bowel obstruction) 03/15/2015   HYPERLIPIDEMIA 03/09/2008   Iron deficiency anemia 03/09/2008   GERD 03/09/2008   Bilateral inguinal hernia (BIH), Left > Right 03/09/2008     REFERRING PROVIDER: Francena Hanly, MD   REFERRING DIAG: ICD-10:  Z96.611  Presence of right artificial shoulder joint  THERAPY DIAG:  Right shoulder pain, unspecified chronicity  Stiffness of right shoulder, not elsewhere classified  Muscle weakness (generalized)  Rationale for Evaluation and Treatment: Rehabilitation  ONSET DATE: DOS  12/27/2023  SUBJECTIVE:  SUBJECTIVE STATEMENT: Pt is 7 weeks and 1 day s/p R reverse total shoulder arthroplasty.  Pt denies any adverse effects after prior Rx.  Pt states his shoulder is doing better.  Pt reports he has been performing some of his HEP.   Hand dominance: Right  PERTINENT HISTORY: R reverse total shoulder arthroplasty  12/27/23  L4-S1 fusion on 05/07/2023.  Pt has had 2 prior lumbar surgeries.   Hx of 5 cervical surgeries including cervical fusion C2-T1.   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  Memory difficulty   Hx  of PE in 2016 and 2021 HTN  PAIN:  NPRS:  Pt denies pain currently, just feels like an irritation. Location:  R shoulder primarily anterior and lateral.  PRECAUTIONS: Other: Reverse shoulder arthoplasty protocol, Lumbar fusion, peripheral neuropathy, hx of cervical fusion, CVA   WEIGHT BEARING RESTRICTIONS: Yes R UE  FALLS:  Has patient fallen in last 6 months? No  LIVING ENVIRONMENT: Lives with: lives with their family Lives in: 2 story home, but stays on 1st floor mainly Stairs: yes  OCCUPATION: Pt is retired.  PLOF: Independent  PATIENT GOALS:  to not have pain.  To have full use of R UE.   NEXT MD VISIT:   OBJECTIVE:  Note: Objective measures were completed at Evaluation unless otherwise noted.  DIAGNOSTIC FINDINGS:  Pt is post op.   UPPER EXTREMITY ROM:    ROM Right eval Left eval Right 3/5 Right 3/14  Shoulder flexion 90 PROM / 82 AAROM 169 AROM 107 PROM / 104-105 deg AAROM 115 PROM and AAROM, 107 AROM (supine)  Shoulder scaption  164 AROM    Shoulder abduction 65 PROM 158 AROM 79 PROM 90 PROM  Shoulder adduction      Shoulder internal rotation      Shoulder external rotation 16 PROM  34 PROM / 28 - 30 deg AAROM 37 PROM / 26 AAROM  Elbow flexion      Elbow extension      Wrist flexion      Wrist extension      Wrist ulnar deviation      Wrist radial deviation      Wrist pronation      Wrist supination      (Blank rows = not tested)                                                                                                                             TREATMENT DATE:   Pt received R shoulder PROM in flexion, abd, and ER per pt and tissue tolerance within protocol ranges. Assessed R shoulder ROM.  Pt performed: Supine wand flexion x 10 w/n protocol range Supine flexion AROM x 10 reps w/n protocol range Supine wand ER 2x10-15 w/n protocol range Shoulder submaximal isometrics with arm at side in abd and ER x 10 reps each with 5 sec  hold Seated pulleys in flexion and scaption x10 each  PT updated HEP and gave pt a HEP handout.  PT instructed pt in appropriate range of motion per protocol and pt tolerance.  PT instructed to not perform exercises into a painful or tight range and he should not have pain with exercises.     PATIENT EDUCATION: Education details: HEP, exercise form, post op and protocol limitations and restrictions, dx, relevant anatomy, objective findings, and POC.   Person educated: Patient Education method: Explanation, Demonstration, Tactile cues, Verbal cues, and Handouts Education comprehension: verbalized understanding, returned demonstration, verbal cues required, tactile cues required, and needs further education  HOME EXERCISE PROGRAM: Access Code: [facility] URL: [email or web address] Date: 01/29/2024 Prepared by: Aaron Edelman  Updated HEP: - Supine Shoulder Wand flexion AAROM  - 2 x daily - 7 x weekly - 2 sets - 10 reps - Supine Shoulder Flexion AROM  - 2 x daily - 7 x weekly - 1 sets - 10 reps - Supine Shoulder External Rotation in 45 Degrees Abduction AAROM with Dowel  - 2 x daily - 7 x weekly - 2 sets - 10 reps  ASSESSMENT:  CLINICAL IMPRESSION: PT progressed exercises per protocol and pt tolerated progression well.  He performed exercises per protocol well  with cuing and instruction in correct form.  PT performed PROM per protocol and pt tolerated PROM well.  He demonstrates improved ROM t/o R shoulder and has reached protocol limits in PROM in flex and abduction.  PT updated HEP and gave pt a HEP handout.  Pt demonstrates good understanding of HEP.  He responded well to Rx having no pain after Rx.  Pt will benefit from continued skilled PT services per protocol to address impairments and improve overall function.   OBJECTIVE IMPAIRMENTS: decreased activity tolerance, decreased ROM, decreased strength, hypomobility, impaired flexibility, impaired UE functional use, and pain.    ACTIVITY LIMITATIONS: carrying, lifting, bathing, dressing, reach over head, and hygiene/grooming  PARTICIPATION LIMITATIONS: meal prep, cleaning, laundry, shopping, and community activity  PERSONAL FACTORS: 3+ comorbidities: L4-S1 fusion on 05/07/2023, cervical fusion, CVA, DM Type 2, peripheral neuropathy  are also affecting patient's functional outcome.   are also affecting patient's functional outcome.   REHAB POTENTIAL: Good  CLINICAL DECISION MAKING: Stable/uncomplicated  EVALUATION COMPLEXITY: Low   GOALS:   SHORT TERM GOALS: Target date:  02/26/24   Pt will be independent with HEP for improved pain, ROM, and function.   Baseline: Goal status: INITIAL  2.  Pt will progress with PROM per protocol and demo 115 deg in flexion, 90 deg in abd, and 45 deg in ER for improved mobility and stiffness.  Baseline:  Goal status: INITIAL  3.  Pt will progress with shoulder AAROM per protocol without adverse effects for improved shoulder mobility and performance of daily activities.  Baseline:  Goal status: INITIAL  4.  Pt will demo R shoulder flexion AAROM in supine to 130 deg and ER AAROM to 40 deg for improved stiffness and mobility.   Baseline:  Goal status: INITIAL Target date:  03/11/24   5.  Pt will be able to actively elevate R shoulder > 90 deg without significant shoulder hike.  Baseline:  Goal status: INITIAL Target date:  03/25/24   6.  Pt will be able to perform her self care activities with no > than minimal difficulty and without significant pain.  Baseline:  Goal status: INITIAL Target date:  03/25/2024   LONG TERM GOALS: Target date: 04/22/2024  Pt will be able to perform his ADLs and IADLs without significant difficulty and pain.  Baseline:  Goal status: INITIAL  2.  Pt will be able to perform his normal reaching and functional overhead activities without significant pain and limitation.  Baseline:  Goal status: INITIAL  3.   Pt will demo L shoulder  AROM to be [facility] t/o for performance of ADLs and IADLs.  Baseline:  Goal status: INITIAL  4.  Pt will be able to reach into an overhead cabinet without significant difficulty.  Baseline:  Goal status: INITIAL  5.  Pt will progress with strengthening exercises per protocol without adverse effects in order to perform his normal functional lifting/carrying activities and household chores.   Baseline:  Goal status: INITIAL   PLAN:  PT FREQUENCY: 2x/week  PT DURATION: 12 weeks  PLANNED INTERVENTIONS: 97164- PT Re-evaluation, 97110-Therapeutic exercises, 97530- Therapeutic activity, 97112- Neuromuscular re-education, 97535- Self Care, 86578- Manual therapy, [phone]- Aquatic Therapy, [id number]- Electrical stimulation (unattended), Patient/Family education, Taping, Dry Needling, Joint mobilization, Scar mobilization, Cryotherapy, and Moist heat  PLAN FOR NEXT SESSION: Cont per Dr. Dub Mikes Reverse Total Shoulder Arthroplasty protocol.  Review and perform HEP.  Work on correct form with pendulums   Audie Clear III PT, DPT 02/15/24 9:54 PM

## 2024-02-20 ENCOUNTER — Encounter (HOSPITAL_BASED_OUTPATIENT_CLINIC_OR_DEPARTMENT_OTHER): Payer: Self-pay | Admitting: Physical Therapy

## 2024-02-20 ENCOUNTER — Ambulatory Visit (HOSPITAL_BASED_OUTPATIENT_CLINIC_OR_DEPARTMENT_OTHER): Payer: Medicare Other | Admitting: Physical Therapy

## 2024-02-20 DIAGNOSIS — M6281 Muscle weakness (generalized): Secondary | ICD-10-CM

## 2024-02-20 DIAGNOSIS — M25511 Pain in right shoulder: Secondary | ICD-10-CM

## 2024-02-20 DIAGNOSIS — M25611 Stiffness of right shoulder, not elsewhere classified: Secondary | ICD-10-CM

## 2024-02-20 NOTE — Therapy (Signed)
 OUTPATIENT PHYSICAL THERAPY SHOULDER TREATMENT   Patient Name: James Barber MRN: [id number] DOB:February 01, 1946, 78 y.o., male Today's Date: 02/21/2024  END OF SESSION: PT End of Session - 02/20/24    Visit Number 4   Number of Visits 24   Date for PT Re-Evaluation 04/22/2024   Authorization Type BCBS MCR   PT Start Time  1451   PT Stop Time 1536   PT Time Calculation (min) 45 min   Activity Tolerance Patient tolerated treatment well   Behavior During Therapy  WFL for tasks assessed/performed      Past Medical History:  Diagnosis Date   Arthritis    Bowel obstruction (HCC)    Cancer (HCC)    prostate- "everything removed and clear"   Complication of anesthesia    Hard to wake and spikes fever   Diabetes mellitus without complication (HCC)    GERD (gastroesophageal reflux disease)    pt has not had issues with this in "a long time"   History of pulmonary embolus (PE)    Hypertension    Hypothyroidism    Ischemic stroke (HCC) 12/2022   Sleep apnea    Past Surgical History:  Procedure Laterality Date   ABDOMINAL SURGERY     "had ileostomy for about 4 months and had it reversed- due to salmonella infection- this happened in [place]"   ANTERIOR CERVICAL DECOMP/DISCECTOMY FUSION  11/12/2012   Procedure: ANTERIOR CERVICAL DECOMPRESSION/DISCECTOMY FUSION 2 LEVELS;  Surgeon: Barnett Abu, MD;  Location: [facility];  Service: Neurosurgery;  Laterality: N/A;  Right Side approach Cervical six-seven,Cervical seven -thoracic one Anterior cervical decompression/diskectomy/fusion   BACK SURGERY  2023   x2 (outpatient with Dr. Danielle Dess)   CARPAL TUNNEL RELEASE Bilateral    CERVICAL FUSION     c 3-4, C 4-5, C5-6   HERNIA REPAIR     right inguinal   LAPAROTOMY N/A 03/22/2015   Procedure: DIAGONOSTIC LAPAROSCOPY, LAPROSCOPIC LYSIS OF ADHESIONS FOR ONE HOUR, DISTAL SMALL BOWEL RESECTION;  Surgeon: [name], MD;  Location: [facility];  Service: General;  Laterality: N/A;   REVERSE  SHOULDER ARTHROPLASTY Right 12/27/2023   Procedure: REVERSE SHOULDER ARTHROPLASTY;  Surgeon: Francena Hanly, MD;  Location: [facility];  Service: Orthopedics;  Laterality: Right;    Patient Active Problem List   Diagnosis Date Noted   Spondylolisthesis at L4-L5 level 05/07/2023   Acute pulmonary embolus (HCC) 04/19/2015   Rash and nonspecific skin eruption 04/19/2015   Diabetes mellitus, type II (HCC) 03/30/2015   Elevated LFTs 03/30/2015   Abnormal liver function tests 03/30/2015   Incisional hernia, without obstruction or gangrene 03/21/2015   Recurrent SBO (small bowel obstruction) 03/15/2015   HYPERLIPIDEMIA 03/09/2008   Iron deficiency anemia 03/09/2008   GERD 03/09/2008   Bilateral inguinal hernia (BIH), Left > Right 03/09/2008     REFERRING PROVIDER: Francena Hanly, MD   REFERRING DIAG: ICD-10:  Z96.611  Presence of right artificial shoulder joint  THERAPY DIAG:  Right shoulder pain, unspecified chronicity  Stiffness of right shoulder, not elsewhere classified  Muscle weakness (generalized)  Rationale for Evaluation and Treatment: Rehabilitation  ONSET DATE: DOS  12/27/2023  SUBJECTIVE:  SUBJECTIVE STATEMENT: Pt is 7 weeks and 6 days s/p R reverse total shoulder arthroplasty.   Pt states his back is bothering him and his shoulder is doing pretty good.  Pt denies any adverse effects after prior Rx.  Pt states his shoulder is doing better.  Pt reports he has been performing his HEP "semi-regular".  Pt states he had some difficulty with wand ER.   Hand dominance: Right  PERTINENT HISTORY: R reverse total shoulder arthroplasty  12/27/23  L4-S1 fusion on 05/07/2023.  Pt has had 2 prior lumbar surgeries.   Hx of 5 cervical surgeries including cervical fusion C2-T1.   DM type 2, peripheral  neuropathy in bilat feet   CVA 12/2022--R sided visual deficits, some recall/processing deficits.  Memory difficulty   Hx of PE in 2016 and 2021 HTN  PAIN:  NPRS:  a little pain, but not bad Location:  R shoulder primarily anterior and lateral.  PRECAUTIONS: Other: Reverse shoulder arthoplasty protocol, Lumbar fusion, peripheral neuropathy, hx of cervical fusion, CVA   WEIGHT BEARING RESTRICTIONS: Yes R UE  FALLS:  Has patient fallen in last 6 months? No  LIVING ENVIRONMENT: Lives with: lives with their family Lives in: 2 story home, but stays on 1st floor mainly Stairs: yes  OCCUPATION: Pt is retired.  PLOF: Independent  PATIENT GOALS:  to not have pain.  To have full use of R UE.   NEXT MD VISIT:   OBJECTIVE:  Note: Objective measures were completed at Evaluation unless otherwise noted.  DIAGNOSTIC FINDINGS:  Pt is post op.   UPPER EXTREMITY ROM:    ROM Right eval Left eval Right 3/5 Right 3/14 Right 3/18  Shoulder flexion 90 PROM / 82 AAROM 169 AROM 107 PROM / 104-105 deg AAROM 115 PROM and AAROM, 107 AROM (supine) 115 PROM / 107 AROM (supine)  Shoulder scaption  164 AROM     Shoulder abduction 65 PROM 158 AROM 79 PROM 90 PROM 90 PROM  Shoulder adduction       Shoulder internal rotation       Shoulder external rotation 16 PROM  34 PROM / 28 - 30 deg AAROM 37 PROM / 26 AAROM 43 PROM / 37 AAROM  Elbow flexion       Elbow extension       Wrist flexion       Wrist extension       Wrist ulnar deviation       Wrist radial deviation       Wrist pronation       Wrist supination       (Blank rows = not tested)                                                                                                                             TREATMENT DATE:   Pt received R shoulder PROM in flexion, abd, and ER per pt and tissue tolerance within protocol ranges. Assessed R  shoulder ROM.  Pt performed: Supine flexion AROM  w/n protocol range Supine wand ER  2x10-15 w/n protocol range Supine serratus punch with PT assist x 10 reps Standing wall slides in flexion with pillowcase w/n protocol range Shoulder submaximal isometrics with arm at side in abd and ER x 10 reps each with 5 sec hold Seated pulleys in flexion and scaption x20 each  PT updated HEP and gave pt a HEP handout.  PT educated pt in correct form and appropriate frequency.  PT instructed pt he should not have pain with exercises.     PATIENT EDUCATION: Education details: HEP, exercise form, post op and protocol limitations and restrictions, dx, relevant anatomy, objective findings, and POC.   PT instructed pt to be compliant with HEP.    Person educated: Patient Education method: Explanation, Demonstration, Tactile cues, Verbal cues, and Handouts Education comprehension: verbalized understanding, returned demonstration, verbal cues required, tactile cues required, and needs further education  HOME EXERCISE PROGRAM: Access Code: [facility] URL: [email or web address] Date: 01/29/2024 Prepared by: Aaron Edelman  Updated HEP: - Isometric Shoulder Abduction at Wall  - 1 x daily - 5 x weekly - 1-2 sets - 10 reps - 5 seconds hold - Standing Isometric Shoulder External Rotation with Doorway  - 1 x daily - 5 x weekly - 1-2 sets - 10 reps - 5 seconds hold  ASSESSMENT:  CLINICAL IMPRESSION: Pt is progressing appropriately with ROM per protocol.  He has reached limits in flexion and abd PROM.  Pt demonstrates improved ER PROM and AAROM.  Pt performed exercises per protocol well with cuing and instruction in correct form.  PT had pt attempt standing wall walks in flexion though pt unable to perform due to finger mobility though was able to perform wall slides in flexion with pillowcase.  PT provided cuing for correct form and posture.  PT updated HEP and gave pt a HEP handout.  He responded well to Rx having no increased pain after Rx.  Pt will benefit from continued skilled PT  services per protocol to address impairments and improve overall function.   OBJECTIVE IMPAIRMENTS: decreased activity tolerance, decreased ROM, decreased strength, hypomobility, impaired flexibility, impaired UE functional use, and pain.   ACTIVITY LIMITATIONS: carrying, lifting, bathing, dressing, reach over head, and hygiene/grooming  PARTICIPATION LIMITATIONS: meal prep, cleaning, laundry, shopping, and community activity  PERSONAL FACTORS: 3+ comorbidities: L4-S1 fusion on 05/07/2023, cervical fusion, CVA, DM Type 2, peripheral neuropathy  are also affecting patient's functional outcome.   are also affecting patient's functional outcome.   REHAB POTENTIAL: Good  CLINICAL DECISION MAKING: Stable/uncomplicated  EVALUATION COMPLEXITY: Low   GOALS:   SHORT TERM GOALS: Target date:  02/26/24   Pt will be independent with HEP for improved pain, ROM, and function.   Baseline: Goal status: INITIAL  2.  Pt will progress with PROM per protocol and demo 115 deg in flexion, 90 deg in abd, and 45 deg in ER for improved mobility and stiffness.  Baseline:  Goal status: INITIAL  3.  Pt will progress with shoulder AAROM per protocol without adverse effects for improved shoulder mobility and performance of daily activities.  Baseline:  Goal status: INITIAL  4.  Pt will demo R shoulder flexion AAROM in supine to 130 deg and ER AAROM to 40 deg for improved stiffness and mobility.   Baseline:  Goal status: INITIAL Target date:  03/11/24   5.  Pt will be able to actively elevate R shoulder > 90 deg without  significant shoulder hike.  Baseline:  Goal status: INITIAL Target date:  03/25/24   6.  Pt will be able to perform her self care activities with no > than minimal difficulty and without significant pain.  Baseline:  Goal status: INITIAL Target date:  03/25/2024   LONG TERM GOALS: Target date: 04/22/2024   Pt will be able to perform his ADLs and IADLs without significant difficulty  and pain.  Baseline:  Goal status: INITIAL  2.  Pt will be able to perform his normal reaching and functional overhead activities without significant pain and limitation.  Baseline:  Goal status: INITIAL  3.   Pt will demo L shoulder AROM to be [facility] t/o for performance of ADLs and IADLs.  Baseline:  Goal status: INITIAL  4.  Pt will be able to reach into an overhead cabinet without significant difficulty.  Baseline:  Goal status: INITIAL  5.  Pt will progress with strengthening exercises per protocol without adverse effects in order to perform his normal functional lifting/carrying activities and household chores.   Baseline:  Goal status: INITIAL   PLAN:  PT FREQUENCY: 2x/week  PT DURATION: 12 weeks  PLANNED INTERVENTIONS: 97164- PT Re-evaluation, 97110-Therapeutic exercises, 97530- Therapeutic activity, 97112- Neuromuscular re-education, 97535- Self Care, [id number]- Manual therapy, [phone]- Aquatic Therapy, [id number]- Electrical stimulation (unattended), Patient/Family education, Taping, Dry Needling, Joint mobilization, Scar mobilization, Cryotherapy, and Moist heat  PLAN FOR NEXT SESSION: Cont per Dr. Dub Mikes Reverse Total Shoulder Arthroplasty protocol.  Review and perform HEP.  Work on correct form with pendulums   Audie Clear III PT, DPT 02/21/24 5:13 PM

## 2024-02-22 ENCOUNTER — Encounter (HOSPITAL_BASED_OUTPATIENT_CLINIC_OR_DEPARTMENT_OTHER): Payer: Medicare Other | Admitting: Physical Therapy

## 2024-02-24 NOTE — Therapy (Signed)
 OUTPATIENT PHYSICAL THERAPY SHOULDER TREATMENT   Patient Name: James Barber MRN: [id number] DOB:01/27/1946, 78 y.o., male Today's Date: 02/26/2024  END OF SESSION: PT End of Session - 02/25/24    Visit Number 5   Number of Visits 24   Date for PT Re-Evaluation 04/22/2024   Authorization Type BCBS MCR   PT Start Time  1457   PT Stop Time 1533   PT Time Calculation (min) 36 min   Activity Tolerance Patient tolerated treatment well   Behavior During Therapy  WFL for tasks assessed/performed      Past Medical History:  Diagnosis Date   Arthritis    Bowel obstruction (HCC)    Cancer (HCC)    prostate- "everything removed and clear"   Complication of anesthesia    Hard to wake and spikes fever   Diabetes mellitus without complication (HCC)    GERD (gastroesophageal reflux disease)    pt has not had issues with this in "a long time"   History of pulmonary embolus (PE)    Hypertension    Hypothyroidism    Ischemic stroke (HCC) 12/2022   Sleep apnea    Past Surgical History:  Procedure Laterality Date   ABDOMINAL SURGERY     "had ileostomy for about 4 months and had it reversed- due to salmonella infection- [place]   ANTERIOR CERVICAL DECOMP/DISCECTOMY FUSION  11/12/2012   Procedure: ANTERIOR CERVICAL DECOMPRESSION/DISCECTOMY FUSION 2 LEVELS;  Surgeon: Barnett Abu, MD;  Location: [facility];  Service: Neurosurgery;  Laterality: N/A;  Right Side approach Cervical six-seven,Cervical seven -thoracic one Anterior cervical decompression/diskectomy/fusion   BACK SURGERY  2023   x2 (outpatient with Dr. Danielle Dess)   CARPAL TUNNEL RELEASE Bilateral    CERVICAL FUSION     c 3-4, C 4-5, C5-6   HERNIA REPAIR     right inguinal   LAPAROTOMY N/A 03/22/2015   Procedure: DIAGONOSTIC LAPAROSCOPY, LAPROSCOPIC LYSIS OF ADHESIONS FOR ONE HOUR, DISTAL SMALL BOWEL RESECTION;  Surgeon: [name], MD;  Location: [facility];  Service: General;  Laterality: N/A;   REVERSE  SHOULDER ARTHROPLASTY Right 12/27/2023   Procedure: REVERSE SHOULDER ARTHROPLASTY;  Surgeon: Francena Hanly, MD;  Location: [facility];  Service: Orthopedics;  Laterality: Right;    Patient Active Problem List   Diagnosis Date Noted   Spondylolisthesis at L4-L5 level 05/07/2023   Acute pulmonary embolus (HCC) 04/19/2015   Rash and nonspecific skin eruption 04/19/2015   Diabetes mellitus, type II (HCC) 03/30/2015   Elevated LFTs 03/30/2015   Abnormal liver function tests 03/30/2015   Incisional hernia, without obstruction or gangrene 03/21/2015   Recurrent SBO (small bowel obstruction) 03/15/2015   HYPERLIPIDEMIA 03/09/2008   Iron deficiency anemia 03/09/2008   GERD 03/09/2008   Bilateral inguinal hernia (BIH), Left > Right 03/09/2008     REFERRING PROVIDER: Francena Hanly, MD   REFERRING DIAG: ICD-10:  Z96.611  Presence of right artificial shoulder joint  THERAPY DIAG:  Right shoulder pain, unspecified chronicity  Stiffness of right shoulder, not elsewhere classified  Muscle weakness (generalized)  Rationale for Evaluation and Treatment: Rehabilitation  ONSET DATE: DOS  12/27/2023  SUBJECTIVE:  SUBJECTIVE STATEMENT: Pt is 8 weeks and 4 days s/p R reverse total shoulder arthroplasty.   Pt states he is pleased with his shoulder at this time though his back is bothering him.  He states his shoulder is becoming less of an issue.  Pt denies any adverse effects after prior Rx.  Pt reports he performed the submaximal isometics at home though didn't perform his other home exercises.  Pt states he has had some tightness in cervical.    Hand dominance: Right  PERTINENT HISTORY: R reverse total shoulder arthroplasty  12/27/23  L4-S1 fusion on 05/07/2023.  Pt has had 2 prior lumbar surgeries.   Hx of 5  cervical surgeries including cervical fusion C2-T1.   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  Memory difficulty   Hx of PE in 2016 and 2021 HTN  PAIN:  NPRS:  a little pain, but not bad Location:  R shoulder primarily anterior and lateral.  PRECAUTIONS: Other: Reverse shoulder arthoplasty protocol, Lumbar fusion, peripheral neuropathy, hx of cervical fusion, CVA   WEIGHT BEARING RESTRICTIONS: Yes R UE  FALLS:  Has patient fallen in last 6 months? No  LIVING ENVIRONMENT: Lives with: lives with their family Lives in: 2 story home, but stays on 1st floor mainly Stairs: yes  OCCUPATION: Pt is retired.  PLOF: Independent  PATIENT GOALS:  to not have pain.  To have full use of R UE.   NEXT MD VISIT:   OBJECTIVE:  Note: Objective measures were completed at Evaluation unless otherwise noted.  DIAGNOSTIC FINDINGS:  Pt is post op.   UPPER EXTREMITY ROM:    ROM Right eval Left eval Right 3/5 Right 3/14 Right 3/18  Shoulder flexion 90 PROM / 82 AAROM 169 AROM 107 PROM / 104-105 deg AAROM 115 PROM and AAROM, 107 AROM (supine) 115 PROM / 107 AROM (supine)  Shoulder scaption  164 AROM     Shoulder abduction 65 PROM 158 AROM 79 PROM 90 PROM 90 PROM  Shoulder adduction       Shoulder internal rotation       Shoulder external rotation 16 PROM  34 PROM / 28 - 30 deg AAROM 37 PROM / 26 AAROM 43 PROM / 37 AAROM  Elbow flexion       Elbow extension       Wrist flexion       Wrist extension       Wrist ulnar deviation       Wrist radial deviation       Wrist pronation       Wrist supination       (Blank rows = not tested)                                                                                                                             TREATMENT DATE:   Pt received R shoulder PROM in  flexion, scaption, and ER per pt and tissue tolerance within protocol ranges.  Pt  performed: Seated pulleys in flexion and scaption  Supine flexion AROM w/n protocol range   Supine flexion AROM w/n protocol range with head elevated x 10 reps Supine ER AROM  w/n protocol range with assistance and cuing for correct form Supine wand ER x 10 reps Standing wall slides in flexion with pillowcase w/n protocol range x 5 reps and x 8 reps Shoulder submaximal isometrics with arm at side in abd and ER approx 5 reps each with 5 sec hold      PATIENT EDUCATION: Education details: HEP, exercise form, post op and protocol limitations and restrictions, dx, relevant anatomy, and POC.   PT instructed pt to be compliant with HEP and informed pt on the importance of being compliant with HEP.     Person educated: Patient Education method: Explanation, Demonstration, Tactile cues, Verbal cues, and Handouts Education comprehension: verbalized understanding, returned demonstration, verbal cues required, tactile cues required, and needs further education  HOME EXERCISE PROGRAM: Access Code: [facility] URL: [email or web address] Date: 01/29/2024 Prepared by: Aaron Edelman  Updated HEP: - Isometric Shoulder Abduction at Wall  - 1 x daily - 5 x weekly - 1-2 sets - 10 reps - 5 seconds hold - Standing Isometric Shoulder External Rotation with Doorway  - 1 x daily - 5 x weekly - 1-2 sets - 10 reps - 5 seconds hold  ASSESSMENT:  CLINICAL IMPRESSION: Pt presents to Rx stating his shoulder is doing well.  Pt tolerated PROM well.  He has expected tightness in shoulder PROM and is progressing appropriately with ROM per protocol.  PT progressed exercises per protocol.  PT had pt to perform supine flexion AROM with head elevated to increase the effects of gravity and pt performed well.  He did require some cuing to keep elbow straight though performed exercise well.  Pt required assistance and cuing for correct form with supine ER AROM.  Pt is not performing his entire HEP.  PT encouraged pt  to be compliant with HEP and educated pt on the importance of performing HEP.   He responded well to Rx having no increased pain after Rx.  Pt will benefit from continued skilled PT services per protocol to address impairments and improve overall function.     OBJECTIVE IMPAIRMENTS: decreased activity tolerance, decreased ROM, decreased strength, hypomobility, impaired flexibility, impaired UE functional use, and pain.   ACTIVITY LIMITATIONS: carrying, lifting, bathing, dressing, reach over head, and hygiene/grooming  PARTICIPATION LIMITATIONS: meal prep, cleaning, laundry, shopping, and community activity  PERSONAL FACTORS: 3+ comorbidities: L4-S1 fusion on 05/07/2023, cervical fusion, CVA, DM Type 2, peripheral neuropathy  are also affecting patient's functional outcome.   are also affecting patient's functional outcome.   REHAB POTENTIAL: Good  CLINICAL DECISION MAKING: Stable/uncomplicated  EVALUATION COMPLEXITY: Low   GOALS:   SHORT TERM GOALS: Target date:  02/26/24   Pt will be independent with HEP for improved pain, ROM, and function.   Baseline: Goal status: INITIAL  2.  Pt will progress with PROM per protocol and demo 115 deg in flexion, 90 deg in abd, and 45 deg in ER for improved mobility and stiffness.  Baseline:  Goal status: INITIAL  3.  Pt will progress with shoulder AAROM per protocol without adverse effects for improved shoulder mobility and performance of daily activities.  Baseline:  Goal status: INITIAL  4.  Pt will demo R shoulder flexion AAROM in supine to 130 deg  and ER AAROM to 40 deg for improved stiffness and mobility.   Baseline:  Goal status: INITIAL Target date:  03/11/24   5.  Pt will be able to actively elevate R shoulder > 90 deg without significant shoulder hike.  Baseline:  Goal status: INITIAL Target date:  03/25/24   6.  Pt will be able to perform her self care activities with no > than minimal difficulty and without significant pain.   Baseline:  Goal status: INITIAL Target date:  03/25/2024   LONG TERM GOALS: Target date: 04/22/2024   Pt will be able to perform his ADLs and IADLs without significant difficulty and pain.  Baseline:  Goal status: INITIAL  2.  Pt will be able to perform his normal reaching and functional overhead activities without significant pain and limitation.  Baseline:  Goal status: INITIAL  3.   Pt will demo L shoulder AROM to be [facility] t/o for performance of ADLs and IADLs.  Baseline:  Goal status: INITIAL  4.  Pt will be able to reach into an overhead cabinet without significant difficulty.  Baseline:  Goal status: INITIAL  5.  Pt will progress with strengthening exercises per protocol without adverse effects in order to perform his normal functional lifting/carrying activities and household chores.   Baseline:  Goal status: INITIAL   PLAN:  PT FREQUENCY: 2x/week  PT DURATION: 12 weeks  PLANNED INTERVENTIONS: 97164- PT Re-evaluation, 97110-Therapeutic exercises, 97530- Therapeutic activity, 97112- Neuromuscular re-education, 97535- Self Care, [id number]- Manual therapy, [phone]- Aquatic Therapy, [id number]- Electrical stimulation (unattended), Patient/Family education, Taping, Dry Needling, Joint mobilization, Scar mobilization, Cryotherapy, and Moist heat  PLAN FOR NEXT SESSION: Cont per Dr. Dub Mikes Reverse Total Shoulder Arthroplasty protocol.  Review and perform HEP.  Work on correct form with pendulums. Possible STM to UT/cervical paraspinals.    Audie Clear III PT, DPT 02/26/24 5:59 PM

## 2024-02-25 ENCOUNTER — Ambulatory Visit (HOSPITAL_BASED_OUTPATIENT_CLINIC_OR_DEPARTMENT_OTHER): Payer: Medicare Other | Admitting: Physical Therapy

## 2024-02-25 ENCOUNTER — Encounter (HOSPITAL_BASED_OUTPATIENT_CLINIC_OR_DEPARTMENT_OTHER): Payer: Self-pay | Admitting: Physical Therapy

## 2024-02-25 DIAGNOSIS — M6281 Muscle weakness (generalized): Secondary | ICD-10-CM

## 2024-02-25 DIAGNOSIS — M25511 Pain in right shoulder: Secondary | ICD-10-CM

## 2024-02-25 DIAGNOSIS — M25611 Stiffness of right shoulder, not elsewhere classified: Secondary | ICD-10-CM

## 2024-02-27 ENCOUNTER — Ambulatory Visit (HOSPITAL_BASED_OUTPATIENT_CLINIC_OR_DEPARTMENT_OTHER): Payer: Medicare Other | Admitting: Physical Therapy

## 2024-02-27 ENCOUNTER — Encounter (HOSPITAL_BASED_OUTPATIENT_CLINIC_OR_DEPARTMENT_OTHER): Payer: Self-pay | Admitting: Physical Therapy

## 2024-02-27 DIAGNOSIS — M25611 Stiffness of right shoulder, not elsewhere classified: Secondary | ICD-10-CM

## 2024-02-27 DIAGNOSIS — M6281 Muscle weakness (generalized): Secondary | ICD-10-CM

## 2024-02-27 DIAGNOSIS — M25511 Pain in right shoulder: Secondary | ICD-10-CM | POA: Diagnosis not present

## 2024-02-27 NOTE — Therapy (Signed)
 OUTPATIENT PHYSICAL THERAPY SHOULDER TREATMENT   Patient Name: James Barber MRN: [id number] DOB:1946/02/04, 78 y.o., male Today's Date: 02/28/2024  END OF SESSION: PT End of Session -   02/27/2024    Visit Number 6   Number of Visits 24   Date for PT Re-Evaluation 04/22/2024   Authorization Type [employer]BS MCR   PT Start Time  1453   PT Stop Time 1537   PT Time Calculation (min) 44 mins   Activity Tolerance Patient tolerated treatment well   Behavior During Therapy  WFL for tasks assessed/performed        Past Medical History:  Diagnosis Date   Arthritis    Bowel obstruction (HCC)    Cancer (HCC)    prostate- "everything removed and clear"   Complication of anesthesia    Hard to wake and spikes fever   Diabetes mellitus without complication (HCC)    GERD (gastroesophageal reflux disease)    pt has not had issues with this in "a long time"   History of pulmonary embolus (PE)    Hypertension    Hypothyroidism    Ischemic stroke (HCC) 12/2022   Sleep apnea    Past Surgical History:  Procedure Laterality Date   ABDOMINAL SURGERY     "had ileostomy for about 4 months and had it reversed- due to salmonella infection- this happened in [place]"   ANTERIOR CERVICAL DECOMP/DISCECTOMY FUSION  11/12/2012   Procedure: ANTERIOR CERVICAL DECOMPRESSION/DISCECTOMY FUSION 2 LEVELS;  Surgeon: Barnett Abu, MD;  Location: [facility];  Service: Neurosurgery;  Laterality: N/A;  Right Side approach Cervical six-seven,Cervical seven -thoracic one Anterior cervical decompression/diskectomy/fusion   BACK SURGERY  2023   x2 (outpatient with Dr. Danielle Dess)   CARPAL TUNNEL RELEASE Bilateral    CERVICAL FUSION     c 3-4, C 4-5, C5-6   HERNIA REPAIR     right inguinal   LAPAROTOMY N/A 03/22/2015   Procedure: DIAGONOSTIC LAPAROSCOPY, LAPROSCOPIC LYSIS OF ADHESIONS FOR ONE HOUR, DISTAL SMALL BOWEL RESECTION;  Surgeon: [name], MD;  Location: [facility];  Service: General;  Laterality: N/A;    REVERSE SHOULDER ARTHROPLASTY Right 12/27/2023   Procedure: REVERSE SHOULDER ARTHROPLASTY;  Surgeon: Francena Hanly, MD;  Location: [facility];  Service: Orthopedics;  Laterality: Right;    Patient Active Problem List   Diagnosis Date Noted   Spondylolisthesis at L4-L5 level 05/07/2023   Acute pulmonary embolus (HCC) 04/19/2015   Rash and nonspecific skin eruption 04/19/2015   Diabetes mellitus, type II (HCC) 03/30/2015   Elevated LFTs 03/30/2015   Abnormal liver function tests 03/30/2015   Incisional hernia, without obstruction or gangrene 03/21/2015   Recurrent SBO (small bowel obstruction) 03/15/2015   HYPERLIPIDEMIA 03/09/2008   Iron deficiency anemia 03/09/2008   GERD 03/09/2008   Bilateral inguinal hernia (BIH), Left > Right 03/09/2008     REFERRING PROVIDER: Francena Hanly, MD   REFERRING DIAG: ICD-10:  Z96.611  Presence of right artificial shoulder joint  THERAPY DIAG:  Right shoulder pain, unspecified chronicity  Stiffness of right shoulder, not elsewhere classified  Muscle weakness (generalized)  Rationale for Evaluation and Treatment: Rehabilitation  ONSET DATE: DOS  12/27/2023  SUBJECTIVE:  SUBJECTIVE STATEMENT: Pt is 8 weeks and 6 days s/p R reverse total shoulder arthroplasty.  Pt states he was pretty tired after prior Rx though had no increased pain.  Pt states he had to move things off his driveway due to work on his driveway.  He then had to put the objects back.  He states he was worn out after that.       Hand dominance: Right  PERTINENT HISTORY: R reverse total shoulder arthroplasty  12/27/23  L4-S1 fusion on 05/07/2023.  Pt has had 2 prior lumbar surgeries.   Hx of 5 cervical surgeries including cervical fusion C2-T1.   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  Memory difficulty   Hx of PE in 2016 and 2021 HTN  PAIN:  NPRS:  a little pain, but not bad Location:  R shoulder primarily anterior and lateral.  PRECAUTIONS: Other: Reverse shoulder arthoplasty protocol, Lumbar fusion, peripheral neuropathy, hx of cervical fusion, CVA   WEIGHT BEARING RESTRICTIONS: Yes R UE  FALLS:  Has patient fallen in last 6 months? No  LIVING ENVIRONMENT: Lives with: lives with their family Lives in: 2 story home, but stays on 1st floor mainly Stairs: yes  OCCUPATION: Pt is retired.  PLOF: Independent  PATIENT GOALS:  to not have pain.  To have full use of R UE.   NEXT MD VISIT:   OBJECTIVE:  Note: Objective measures were completed at Evaluation unless otherwise noted.  DIAGNOSTIC FINDINGS:  Pt is post op.   UPPER EXTREMITY ROM:    ROM Right eval Left eval Right 3/5 Right 3/14 Right 3/18  Shoulder flexion 90 PROM / 82 AAROM 169 AROM 107 PROM / 104-105 deg AAROM 115 PROM and AAROM, 107 AROM (supine) 115 PROM / 107 AROM (supine)  Shoulder scaption  164 AROM     Shoulder abduction 65 PROM 158 AROM 79 PROM 90 PROM 90 PROM  Shoulder adduction       Shoulder internal rotation       Shoulder external rotation 16 PROM  34 PROM / 28 - 30 deg AAROM 37 PROM / 26 AAROM 43 PROM / 37 AAROM  Elbow flexion       Elbow extension       Wrist flexion       Wrist extension       Wrist ulnar deviation       Wrist radial deviation       Wrist pronation       Wrist supination       (Blank rows = not tested)                                                                                                                             TREATMENT DATE:   Pt received R shoulder PROM in flexion, scaption, and ER per pt and tissue tolerance within protocol ranges.  Pt performed: Seated pulleys in flexion and scaption 2x12-15 each Supine flexion AROM w/n protocol range with head elevated x 10  reps Supine flexion AROM w/n protocol range in reclined position 2x10 Supine ER AROM w/n protocol range with assistance and cuing for correct form Supine serratus punch with PT assistance 2x10 reps Shoulder submaximal isometrics with arm at side in abd, ER, and flexion x 10 reps each with 5 sec hold Standing wand flexion leaning against rail x 7 reps  Reviewed HEP      PATIENT EDUCATION: Education details: HEP, exercise form, post op and protocol limitations and restrictions, dx, relevant anatomy, and POC.   PT instructed pt to perform HEP.     Person educated: Patient Education method: Explanation, Demonstration, Tactile cues, and Verbal cues Education comprehension: verbalized understanding, returned demonstration, verbal cues required, tactile cues required, and needs further education  HOME EXERCISE PROGRAM: Access Code: [facility] URL: [email or web address] Date: 01/29/2024 Prepared by: Aaron Edelman  Updated HEP: - Isometric Shoulder Abduction at Wall  - 1 x daily - 5 x weekly - 1-2 sets - 10 reps - 5 seconds hold - Standing Isometric Shoulder External Rotation with Doorway  - 1 x daily - 5 x weekly - 1-2 sets - 10 reps - 5 seconds hold  ASSESSMENT:  CLINICAL IMPRESSION: Pt tolerated PROM well and is progressing well with ROM per protocol.  PT progressed AROM/AAROM exercises with increasing the effects of gravity by performing them with head elevated, in reclined position, and standing.  He tolerated progression well and performed exercises well.  Pt is improving with AROM and AAROM.  He does require cuing for correct form with supine ER AROM though demonstrated improved form today.  He responded well to Rx having no change in pain after Rx.  Pt will benefit from continued skilled PT services per protocol to address impairments and improve overall function.    OBJECTIVE IMPAIRMENTS: decreased activity tolerance, decreased ROM, decreased strength, hypomobility,  impaired flexibility, impaired UE functional use, and pain.   ACTIVITY LIMITATIONS: carrying, lifting, bathing, dressing, reach over head, and hygiene/grooming  PARTICIPATION LIMITATIONS: meal prep, cleaning, laundry, shopping, and community activity  PERSONAL FACTORS: 3+ comorbidities: L4-S1 fusion on 05/07/2023, cervical fusion, CVA, DM Type 2, peripheral neuropathy  are also affecting patient's functional outcome.   are also affecting patient's functional outcome.   REHAB POTENTIAL: Good  CLINICAL DECISION MAKING: Stable/uncomplicated  EVALUATION COMPLEXITY: Low   GOALS:   SHORT TERM GOALS: Target date:  02/26/24   Pt will be independent with HEP for improved pain, ROM, and function.   Baseline: Goal status: INITIAL  2.  Pt will progress with PROM per protocol and demo 115 deg in flexion, 90 deg in abd, and 45 deg in ER for improved mobility and stiffness.  Baseline:  Goal status: INITIAL  3.  Pt will progress with shoulder AAROM per protocol without adverse effects for improved shoulder mobility and performance of daily activities.  Baseline:  Goal status: INITIAL  4.  Pt will demo R shoulder flexion AAROM in supine to 130 deg and ER AAROM to 40 deg for improved stiffness and mobility.   Baseline:  Goal status: INITIAL Target date:  03/11/24   5.  Pt will be able to actively elevate R shoulder > 90 deg without significant shoulder hike.  Baseline:  Goal status: INITIAL Target date:  03/25/24   6.  Pt will be able to perform her self care activities with no > than minimal difficulty  and without significant pain.  Baseline:  Goal status: INITIAL Target date:  03/25/2024   LONG TERM GOALS: Target date: 04/22/2024   Pt will be able to perform his ADLs and IADLs without significant difficulty and pain.  Baseline:  Goal status: INITIAL  2.  Pt will be able to perform his normal reaching and functional overhead activities without significant pain and limitation.   Baseline:  Goal status: INITIAL  3.   Pt will demo L shoulder AROM to be [facility] t/o for performance of ADLs and IADLs.  Baseline:  Goal status: INITIAL  4.  Pt will be able to reach into an overhead cabinet without significant difficulty.  Baseline:  Goal status: INITIAL  5.  Pt will progress with strengthening exercises per protocol without adverse effects in order to perform his normal functional lifting/carrying activities and household chores.   Baseline:  Goal status: INITIAL   PLAN:  PT FREQUENCY: 2x/week  PT DURATION: 12 weeks  PLANNED INTERVENTIONS: 97164- PT Re-evaluation, 97110-Therapeutic exercises, 97530- Therapeutic activity, 97112- Neuromuscular re-education, 97535- Self Care, [id number]- Manual therapy, [phone]- Aquatic Therapy, [id number]- Electrical stimulation (unattended), Patient/Family education, Taping, Dry Needling, Joint mobilization, Scar mobilization, Cryotherapy, and Moist heat  PLAN FOR NEXT SESSION: Cont per Dr. Dub Mikes Reverse Total Shoulder Arthroplasty protocol.  Review and perform HEP.  Work on correct form with pendulums. Possible STM to UT/cervical paraspinals.    Audie Clear III PT, DPT 02/28/24 8:40 PM

## 2024-03-03 ENCOUNTER — Encounter (HOSPITAL_BASED_OUTPATIENT_CLINIC_OR_DEPARTMENT_OTHER): Payer: Self-pay | Admitting: Physical Therapy

## 2024-03-03 ENCOUNTER — Ambulatory Visit (HOSPITAL_BASED_OUTPATIENT_CLINIC_OR_DEPARTMENT_OTHER): Payer: Medicare Other | Admitting: Physical Therapy

## 2024-03-03 DIAGNOSIS — M25511 Pain in right shoulder: Secondary | ICD-10-CM

## 2024-03-03 DIAGNOSIS — M25611 Stiffness of right shoulder, not elsewhere classified: Secondary | ICD-10-CM

## 2024-03-03 DIAGNOSIS — M6281 Muscle weakness (generalized): Secondary | ICD-10-CM

## 2024-03-03 NOTE — Therapy (Signed)
 OUTPATIENT PHYSICAL THERAPY SHOULDER TREATMENT   Patient Name: James Barber MRN: [id number] DOB:[birth year], 78 y.o., male Today's Date: 03/03/2024  END OF SESSION: PT End of Session -   03/03/2024    Visit Number 7   Number of Visits 24   Date for PT Re-Evaluation 04/22/2024   Authorization Type BCBS MCR   PT Start Time  1457   PT Stop Time 1543   PT Time Calculation (min) 46 mins   Activity Tolerance Patient tolerated treatment well   Behavior During Therapy  WFL for tasks assessed/performed        Past Medical History:  Diagnosis Date   Arthritis    Bowel obstruction (HCC)    Cancer (HCC)    prostate- "everything removed and clear"   Complication of anesthesia    Hard to wake and spikes fever   Diabetes mellitus without complication (HCC)    GERD (gastroesophageal reflux disease)    pt has not had issues with this in "a long time"   History of pulmonary embolus (PE)    Hypertension    Hypothyroidism    Ischemic stroke (HCC) 12/2022   Sleep apnea    Past Surgical History:  Procedure Laterality Date   ABDOMINAL SURGERY     "had ileostomy for about 4 months and had it reversed- due to salmonella infection- [place]   ANTERIOR CERVICAL DECOMP/DISCECTOMY FUSION  11/12/2012   Procedure: ANTERIOR CERVICAL DECOMPRESSION/DISCECTOMY FUSION 2 LEVELS;  Surgeon: Barnett Abu, MD;  Location: [facility];  Service: Neurosurgery;  Laterality: N/A;  Right Side approach Cervical six-seven,Cervical seven -thoracic one Anterior cervical decompression/diskectomy/fusion   BACK SURGERY  2023   x2 (outpatient with Dr. Danielle Dess)   CARPAL TUNNEL RELEASE Bilateral    CERVICAL FUSION     c 3-4, C 4-5, C5-6   HERNIA REPAIR     right inguinal   LAPAROTOMY N/A 03/22/2015   Procedure: DIAGONOSTIC LAPAROSCOPY, LAPROSCOPIC LYSIS OF ADHESIONS FOR ONE HOUR, DISTAL SMALL BOWEL RESECTION;  Surgeon: [name], MD;  Location: [facility];  Service: General;  Laterality: N/A;    REVERSE SHOULDER ARTHROPLASTY Right 12/27/2023   Procedure: REVERSE SHOULDER ARTHROPLASTY;  Surgeon: Francena Hanly, MD;  Location: [facility];  Service: Orthopedics;  Laterality: Right;    Patient Active Problem List   Diagnosis Date Noted   Spondylolisthesis at L4-L5 level 05/07/2023   Acute pulmonary embolus (HCC) 04/19/2015   Rash and nonspecific skin eruption 04/19/2015   Diabetes mellitus, type II (HCC) 03/30/2015   Elevated LFTs 03/30/2015   Abnormal liver function tests 03/30/2015   Incisional hernia, without obstruction or gangrene 03/21/2015   Recurrent SBO (small bowel obstruction) 03/15/2015   HYPERLIPIDEMIA 03/09/2008   Iron deficiency anemia 03/09/2008   GERD 03/09/2008   Bilateral inguinal hernia (BIH), Left > Right 03/09/2008     REFERRING PROVIDER: Francena Hanly, MD   REFERRING DIAG: ICD-10:  Z96.611  Presence of right artificial shoulder joint  THERAPY DIAG:  Right shoulder pain, unspecified chronicity  Stiffness of right shoulder, not elsewhere classified  Muscle weakness (generalized)  Rationale for Evaluation and Treatment: Rehabilitation  ONSET DATE: DOS  12/27/2023  SUBJECTIVE:  SUBJECTIVE STATEMENT: Pt is 9 weeks and 4 days s/p R reverse total shoulder arthroplasty.  "I generally don't have pain."  Pt states he was pretty tired after prior Rx though had no increased pain.  Pt states his back has been bothering him though is doing better today.   Hand dominance: Right  PERTINENT HISTORY: R reverse total shoulder arthroplasty  12/27/23  L4-S1 fusion on 05/07/2023.  Pt has had 2 prior lumbar surgeries.   Hx of 5 cervical surgeries including cervical fusion C2-T1.   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  Memory difficulty   Hx of PE in 2016 and 2021 HTN  PAIN:  NPRS:  a little pain, but not bad Location:  R shoulder primarily anterior and lateral.  PRECAUTIONS: Other: Reverse shoulder arthoplasty protocol, Lumbar fusion, peripheral neuropathy, hx of cervical fusion, CVA   WEIGHT BEARING RESTRICTIONS: Yes R UE  FALLS:  Has patient fallen in last 6 months? No  LIVING ENVIRONMENT: Lives with: lives with their family Lives in: 2 story home, but stays on 1st floor mainly Stairs: yes  OCCUPATION: Pt is retired.  PLOF: Independent  PATIENT GOALS:  to not have pain.  To have full use of R UE.   NEXT MD VISIT:   OBJECTIVE:  Note: Objective measures were completed at Evaluation unless otherwise noted.  DIAGNOSTIC FINDINGS:  Pt is post op.                                                                                                                             TREATMENT DATE:   Pt received R shoulder PROM in flexion, scaption, abd, and ER per pt and tissue tolerance within protocol ranges.  Pt performed: Seated pulleys in flexion and scaption 2x12-15 each Standing wand flexion in front of mirror 2x10 Standing wand scaption in front of mirror 2x10 Seated wand ER 2x10 Supine serratus punch with PT assistance 2x10 reps Shoulder submaximal isometrics with arm at side in abd, ER, and flexion x 10 reps each with 5 sec hold   Updated HEP.  Pt received a HEP handout and was educated in correct form and appropriate frequency.  PT instructed pt to not perform into a painful or tight range.       PATIENT EDUCATION: Education details: HEP, exercise form, post op and protocol limitations and restrictions, dx, relevant anatomy, and POC.   PT instructed pt to perform HEP.     Person educated: Patient Education method: Explanation, Demonstration, Tactile cues, Verbal cues, and handout Education comprehension: verbalized understanding, returned  demonstration, verbal cues required, tactile cues required, and needs further education  HOME EXERCISE PROGRAM: Access Code: [facility] URL: [email or web address] Date: 01/29/2024 Prepared by: Aaron Edelman  Updated HEP: - Standing Shoulder Flexion AAROM with Dowel  - 1 x daily - 7 x weekly - 2 sets - 10 reps - Seated Shoulder External Rotation  AAROM with Dowel  - 1-2 x daily - 7 x weekly - 1-2 sets - 10 reps  ASSESSMENT:  CLINICAL IMPRESSION: Pt is improving with UE elevation AAROM as evidenced by performance of standing wand exercises.  Pt performed standing wand flexion and scaption well with cuing for form.  Pt performed exercises per protocol well with cuing for correct form and positioning.  He tolerated PROM well and is progressing well with ROM per protocol.  Pt had tightness in ER which improved with increased reps of PROM.  PT updated HEP and gave pt a HEP handout.  Pt demonstrates good understanding of HEP.  He responded well to Rx having reporting no pain, just tired after Rx.  Pt will benefit from continued skilled PT services per protocol to address impairments and improve overall function.     OBJECTIVE IMPAIRMENTS: decreased activity tolerance, decreased ROM, decreased strength, hypomobility, impaired flexibility, impaired UE functional use, and pain.   ACTIVITY LIMITATIONS: carrying, lifting, bathing, dressing, reach over head, and hygiene/grooming  PARTICIPATION LIMITATIONS: meal prep, cleaning, laundry, shopping, and community activity  PERSONAL FACTORS: 3+ comorbidities: L4-S1 fusion on 05/07/2023, cervical fusion, CVA, DM Type 2, peripheral neuropathy  are also affecting patient's functional outcome.   are also affecting patient's functional outcome.   REHAB POTENTIAL: Good  CLINICAL DECISION MAKING: Stable/uncomplicated  EVALUATION COMPLEXITY: Low   GOALS:   SHORT TERM GOALS: Target date:  02/26/24   Pt will be independent with HEP for improved  pain, ROM, and function.   Baseline: Goal status: INITIAL  2.  Pt will progress with PROM per protocol and demo 115 deg in flexion, 90 deg in abd, and 45 deg in ER for improved mobility and stiffness.  Baseline:  Goal status: INITIAL  3.  Pt will progress with shoulder AAROM per protocol without adverse effects for improved shoulder mobility and performance of daily activities.  Baseline:  Goal status: INITIAL  4.  Pt will demo R shoulder flexion AAROM in supine to 130 deg and ER AAROM to 40 deg for improved stiffness and mobility.   Baseline:  Goal status: INITIAL Target date:  03/11/24   5.  Pt will be able to actively elevate R shoulder > 90 deg without significant shoulder hike.  Baseline:  Goal status: INITIAL Target date:  03/25/24   6.  Pt will be able to perform her self care activities with no > than minimal difficulty and without significant pain.  Baseline:  Goal status: INITIAL Target date:  03/25/2024   LONG TERM GOALS: Target date: 04/22/2024   Pt will be able to perform his ADLs and IADLs without significant difficulty and pain.  Baseline:  Goal status: INITIAL  2.  Pt will be able to perform his normal reaching and functional overhead activities without significant pain and limitation.  Baseline:  Goal status: INITIAL  3.   Pt will demo L shoulder AROM to be [facility] t/o for performance of ADLs and IADLs.  Baseline:  Goal status: INITIAL  4.  Pt will be able to reach into an overhead cabinet without significant difficulty.  Baseline:  Goal status: INITIAL  5.  Pt will progress with strengthening exercises per protocol without adverse effects in order to perform his normal functional lifting/carrying activities and household chores.   Baseline:  Goal status: INITIAL   PLAN:  PT FREQUENCY: 2x/week  PT DURATION: 12 weeks  PLANNED INTERVENTIONS: 97164- PT Re-evaluation, 97110-Therapeutic exercises, 97530- Therapeutic activity, [id number]- Neuromuscular  re-education, 97535- Self Care,  04540- Manual therapy, [id number]- Aquatic Therapy, [phone]- Electrical stimulation (unattended), Patient/Family education, Taping, Dry Needling, Joint mobilization, Scar mobilization, Cryotherapy, and Moist heat  PLAN FOR NEXT SESSION: Cont per Dr. Dub Mikes Reverse Total Shoulder Arthroplasty protocol.  Review and perform HEP.  Work on correct form with pendulums. Possible STM to UT/cervical paraspinals.    Audie Clear III PT, DPT 03/03/24 5:12 PM

## 2024-03-05 ENCOUNTER — Ambulatory Visit (HOSPITAL_BASED_OUTPATIENT_CLINIC_OR_DEPARTMENT_OTHER): Payer: Medicare Other | Attending: Neurological Surgery | Admitting: Physical Therapy

## 2024-03-05 ENCOUNTER — Encounter (HOSPITAL_BASED_OUTPATIENT_CLINIC_OR_DEPARTMENT_OTHER): Payer: Self-pay | Admitting: Physical Therapy

## 2024-03-05 DIAGNOSIS — M6281 Muscle weakness (generalized): Secondary | ICD-10-CM | POA: Diagnosis present

## 2024-03-05 DIAGNOSIS — M25511 Pain in right shoulder: Secondary | ICD-10-CM | POA: Insufficient documentation

## 2024-03-05 DIAGNOSIS — M25611 Stiffness of right shoulder, not elsewhere classified: Secondary | ICD-10-CM | POA: Insufficient documentation

## 2024-03-05 NOTE — Therapy (Signed)
 OUTPATIENT PHYSICAL THERAPY SHOULDER TREATMENT   Patient Name: James Barber MRN: [id number] DOB:02-12-1946, 78 y.o., male Today's Date: 03/06/2024  END OF SESSION: PT End of Session -   03/05/2024    Visit Number 8   Number of Visits 24   Date for PT Re-Evaluation 04/22/2024   Authorization Type BCBS MCR   PT Start Time  1454   PT Stop Time 1542   PT Time Calculation (min) 48 mins   Activity Tolerance Patient tolerated treatment well   Behavior During Therapy  WFL for tasks assessed/performed        Past Medical History:  Diagnosis Date   Arthritis    Bowel obstruction (HCC)    Cancer (HCC)    prostate- "everything removed and clear"   Complication of anesthesia    Hard to wake and spikes fever   Diabetes mellitus without complication (HCC)    GERD (gastroesophageal reflux disease)    pt has not had issues with this in "a long time"   History of pulmonary embolus (PE)    Hypertension    Hypothyroidism    Ischemic stroke (HCC) 12/2022   Sleep apnea    Past Surgical History:  Procedure Laterality Date   ABDOMINAL SURGERY     "had ileostomy for about 4 months and had it reversed- due to salmonella infection- this happened in [place]"   ANTERIOR CERVICAL DECOMP/DISCECTOMY FUSION  11/12/2012   Procedure: ANTERIOR CERVICAL DECOMPRESSION/DISCECTOMY FUSION 2 LEVELS;  Surgeon: Barnett Abu, MD;  Location: [facility];  Service: Neurosurgery;  Laterality: N/A;  Right Side approach Cervical six-seven,Cervical seven -thoracic one Anterior cervical decompression/diskectomy/fusion   BACK SURGERY  2023   x2 (outpatient with Dr. Danielle Dess)   CARPAL TUNNEL RELEASE Bilateral    CERVICAL FUSION     c 3-4, C 4-5, C5-6   HERNIA REPAIR     right inguinal   LAPAROTOMY N/A 03/22/2015   Procedure: DIAGONOSTIC LAPAROSCOPY, LAPROSCOPIC LYSIS OF ADHESIONS FOR ONE HOUR, DISTAL SMALL BOWEL RESECTION;  Surgeon: [name], MD;  Location: [facility];  Service: General;  Laterality: N/A;    REVERSE SHOULDER ARTHROPLASTY Right 12/27/2023   Procedure: REVERSE SHOULDER ARTHROPLASTY;  Surgeon: Francena Hanly, MD;  Location: [facility];  Service: Orthopedics;  Laterality: Right;    Patient Active Problem List   Diagnosis Date Noted   Spondylolisthesis at L4-L5 level 05/07/2023   Acute pulmonary embolus (HCC) 04/19/2015   Rash and nonspecific skin eruption 04/19/2015   Diabetes mellitus, type II (HCC) 03/30/2015   Elevated LFTs 03/30/2015   Abnormal liver function tests 03/30/2015   Incisional hernia, without obstruction or gangrene 03/21/2015   Recurrent SBO (small bowel obstruction) 03/15/2015   HYPERLIPIDEMIA 03/09/2008   Iron deficiency anemia 03/09/2008   GERD 03/09/2008   Bilateral inguinal hernia (BIH), Left > Right 03/09/2008     REFERRING PROVIDER: Francena Hanly, MD   REFERRING DIAG: ICD-10:  Z96.611  Presence of right artificial shoulder joint  THERAPY DIAG:  Right shoulder pain, unspecified chronicity  Stiffness of right shoulder, not elsewhere classified  Muscle weakness (generalized)  Rationale for Evaluation and Treatment: Rehabilitation  ONSET DATE: DOS  12/27/2023  SUBJECTIVE:  SUBJECTIVE STATEMENT: Pt is 9 weeks and 6 days s/p R reverse total shoulder arthroplasty.  Pt denies pain at rest.  Pt denies any adverse effects after prior Rx.  Pt has been performing yard work.  He is able to start the blower.     Hand dominance: Right  PERTINENT HISTORY: R reverse total shoulder arthroplasty  12/27/23  L4-S1 fusion on 05/07/2023.  Pt has had 2 prior lumbar surgeries.   Hx of 5 cervical surgeries including cervical fusion C2-T1.   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  Memory difficulty   Hx  of PE in 2016 and 2021 HTN  PAIN:  NPRS:  a little pain, but not bad Location:  R shoulder primarily anterior and lateral.  PRECAUTIONS: Other: Reverse shoulder arthoplasty protocol, Lumbar fusion, peripheral neuropathy, hx of cervical fusion, CVA   WEIGHT BEARING RESTRICTIONS: Yes R UE  FALLS:  Has patient fallen in last 6 months? No  LIVING ENVIRONMENT: Lives with: lives with their family Lives in: 2 story home, but stays on 1st floor mainly Stairs: yes  OCCUPATION: Pt is retired.  PLOF: Independent  PATIENT GOALS:  to not have pain.  To have full use of R UE.   NEXT MD VISIT:   OBJECTIVE:  Note: Objective measures were completed at Evaluation unless otherwise noted.  DIAGNOSTIC FINDINGS:  Pt is post op.                                                                                                                             TREATMENT DATE:   Pt received R shoulder PROM in flexion, scaption, abd, and ER per pt and tissue tolerance within protocol ranges.  Pt performed: Seated pulleys in flexion and scaption approx 20 each Supine serratus punch without assistance 2x10 reps Supine shoulder ABC x 1 rep S/L shoulder abduction 2x10 S/L shoulder ER 2x10 Flexion AROM in reclined position  Standing wand flexion in front of mirror 2x10 Standing wand scaption in front of mirror 2x10  Updated HEP.  Pt received a HEP handout and was educated in correct form and appropriate frequency.  PT instructed pt to not perform into a painful or tight range.   Manual Therapy: STM to R UT seated and minimally in L UT.       PATIENT EDUCATION: Education details: HEP, exercise form, post op and protocol limitations and restrictions, dx, relevant anatomy, and POC.   PT instructed pt to perform HEP.     Person educated: Patient Education method: Explanation, Demonstration, Tactile cues, Verbal cues, and handout Education comprehension: verbalized understanding, returned  demonstration, verbal cues required, tactile cues required, and needs further education  HOME EXERCISE PROGRAM: Access Code: [facility] URL: [email or web address] Date: 01/29/2024 Prepared by: Aaron Edelman  Updated HEP: - Supine Single Arm Shoulder Protraction  - 1 x daily - 7 x weekly - 2 sets - 10 reps -  Supine Shoulder Alphabet  - 1-2 x daily - 7 x weekly - 1 rep  ASSESSMENT:  CLINICAL IMPRESSION: Pt is improving with R shoulder ROM.  He is progressing with exercises per protocol.  Pt performed exercises per protocol well with cuing for correct form and positioning.  Pt performed supine serratus punch with good form today.  Pt was challenged with flexion AROM in reclined position and was limited in ROM.  PT updated HEP and gave pt a HEP handout.  Pt demonstrates good understanding of HEP.  Pt reports having increased tightness in neck.  PT palpated UT and he has soft tissue tightness in R UT > L UT.  PT performed STM to UT to improve tightness, mobility, and scapular mechanics.  He responded well to Rx having no pai after Rx.  Pt will benefit from continued skilled PT services per protocol to address impairments and improve overall function.    OBJECTIVE IMPAIRMENTS: decreased activity tolerance, decreased ROM, decreased strength, hypomobility, impaired flexibility, impaired UE functional use, and pain.   ACTIVITY LIMITATIONS: carrying, lifting, bathing, dressing, reach over head, and hygiene/grooming  PARTICIPATION LIMITATIONS: meal prep, cleaning, laundry, shopping, and community activity  PERSONAL FACTORS: 3+ comorbidities: L4-S1 fusion on 05/07/2023, cervical fusion, CVA, DM Type 2, peripheral neuropathy  are also affecting patient's functional outcome.   are also affecting patient's functional outcome.   REHAB POTENTIAL: Good  CLINICAL DECISION MAKING: Stable/uncomplicated  EVALUATION COMPLEXITY: Low   GOALS:   SHORT TERM GOALS: Target date:  02/26/24   Pt  will be independent with HEP for improved pain, ROM, and function.   Baseline: Goal status: INITIAL  2.  Pt will progress with PROM per protocol and demo 115 deg in flexion, 90 deg in abd, and 45 deg in ER for improved mobility and stiffness.  Baseline:  Goal status: INITIAL  3.  Pt will progress with shoulder AAROM per protocol without adverse effects for improved shoulder mobility and performance of daily activities.  Baseline:  Goal status: INITIAL  4.  Pt will demo R shoulder flexion AAROM in supine to 130 deg and ER AAROM to 40 deg for improved stiffness and mobility.   Baseline:  Goal status: INITIAL Target date:  03/11/24   5.  Pt will be able to actively elevate R shoulder > 90 deg without significant shoulder hike.  Baseline:  Goal status: INITIAL Target date:  03/25/24   6.  Pt will be able to perform her self care activities with no > than minimal difficulty and without significant pain.  Baseline:  Goal status: INITIAL Target date:  03/25/2024   LONG TERM GOALS: Target date: 04/22/2024   Pt will be able to perform his ADLs and IADLs without significant difficulty and pain.  Baseline:  Goal status: INITIAL  2.  Pt will be able to perform his normal reaching and functional overhead activities without significant pain and limitation.  Baseline:  Goal status: INITIAL  3.   Pt will demo L shoulder AROM to be [facility] t/o for performance of ADLs and IADLs.  Baseline:  Goal status: INITIAL  4.  Pt will be able to reach into an overhead cabinet without significant difficulty.  Baseline:  Goal status: INITIAL  5.  Pt will progress with strengthening exercises per protocol without adverse effects in order to perform his normal functional lifting/carrying activities and household chores.   Baseline:  Goal status: INITIAL   PLAN:  PT FREQUENCY: 2x/week  PT DURATION: 12 weeks  PLANNED  INTERVENTIONS: 97164- PT Re-evaluation, 97110-Therapeutic exercises, 97530-  Therapeutic activity, [id number]- Neuromuscular re-education, 97535- Self Care, 04540- Manual therapy, [phone]- Aquatic Therapy, [phone]- Electrical stimulation (unattended), Patient/Family education, Taping, Dry Needling, Joint mobilization, Scar mobilization, Cryotherapy, and Moist heat  PLAN FOR NEXT SESSION: Cont per Dr. Dub Mikes Reverse Total Shoulder Arthroplasty protocol.  Review and perform HEP.  Possible STM to UT/cervical paraspinals.    Audie Clear III PT, DPT 03/06/24 9:19 PM

## 2024-03-10 ENCOUNTER — Ambulatory Visit (HOSPITAL_BASED_OUTPATIENT_CLINIC_OR_DEPARTMENT_OTHER): Payer: Medicare Other | Admitting: Physical Therapy

## 2024-03-10 DIAGNOSIS — M6281 Muscle weakness (generalized): Secondary | ICD-10-CM

## 2024-03-10 DIAGNOSIS — M25611 Stiffness of right shoulder, not elsewhere classified: Secondary | ICD-10-CM

## 2024-03-10 DIAGNOSIS — M25511 Pain in right shoulder: Secondary | ICD-10-CM

## 2024-03-11 NOTE — Therapy (Signed)
 OUTPATIENT PHYSICAL THERAPY SHOULDER TREATMENT   Patient Name: James Barber MRN: [id number] DOB:1946/06/02, 78 y.o., male Today's Date: 03/11/2024  END OF SESSION: PT End of Session -   03/10/2024    Visit Number 8   Number of Visits    Date for PT Re-Evaluation    Authorization Type    PT Start Time    PT Stop Time    PT Time Calculation (min)    Activity Tolerance    Behavior During Therapy         Past Medical History:  Diagnosis Date   Arthritis    Bowel obstruction (HCC)    Cancer (HCC)    prostate- "everything removed and clear"   Complication of anesthesia    Hard to wake and spikes fever   Diabetes mellitus without complication (HCC)    GERD (gastroesophageal reflux disease)    pt has not had issues with this in "a long time"   History of pulmonary embolus (PE)    Hypertension    Hypothyroidism    Ischemic stroke (HCC) 12/2022   Sleep apnea    Past Surgical History:  Procedure Laterality Date   ABDOMINAL SURGERY     "had ileostomy for about 4 months and had it reversed- due to salmonella infection- [place]   ANTERIOR CERVICAL DECOMP/DISCECTOMY FUSION  11/12/2012   Procedure: ANTERIOR CERVICAL DECOMPRESSION/DISCECTOMY FUSION 2 LEVELS;  Surgeon: Barnett Abu, MD;  Location: [facility];  Service: Neurosurgery;  Laterality: N/A;  Right Side approach Cervical six-seven,Cervical seven -thoracic one Anterior cervical decompression/diskectomy/fusion   BACK SURGERY  2023   x2 (outpatient with Dr. Danielle Dess)   CARPAL TUNNEL RELEASE Bilateral    CERVICAL FUSION     c 3-4, C 4-5, C5-6   HERNIA REPAIR     right inguinal   LAPAROTOMY N/A 03/22/2015   Procedure: DIAGONOSTIC LAPAROSCOPY, LAPROSCOPIC LYSIS OF ADHESIONS FOR ONE HOUR, DISTAL SMALL BOWEL RESECTION;  Surgeon: [name], MD;  Location: [facility];  Service: General;  Laterality: N/A;   REVERSE SHOULDER ARTHROPLASTY Right 12/27/2023   Procedure: REVERSE SHOULDER ARTHROPLASTY;  Surgeon:  Francena Hanly, MD;  Location: [facility];  Service: Orthopedics;  Laterality: Right;    Patient Active Problem List   Diagnosis Date Noted   Spondylolisthesis at L4-L5 level 05/07/2023   Acute pulmonary embolus (HCC) 04/19/2015   Rash and nonspecific skin eruption 04/19/2015   Diabetes mellitus, type II (HCC) 03/30/2015   Elevated LFTs 03/30/2015   Abnormal liver function tests 03/30/2015   Incisional hernia, without obstruction or gangrene 03/21/2015   Recurrent SBO (small bowel obstruction) 03/15/2015   HYPERLIPIDEMIA 03/09/2008   Iron deficiency anemia 03/09/2008   GERD 03/09/2008   Bilateral inguinal hernia (BIH), Left > Right 03/09/2008     REFERRING PROVIDER: Francena Hanly, MD   REFERRING DIAG: ICD-10:  Z96.611  Presence of right artificial shoulder joint  THERAPY DIAG:  Right shoulder pain, unspecified chronicity  Stiffness of right shoulder, not elsewhere classified  Muscle weakness (generalized)  Rationale for Evaluation and Treatment: Rehabilitation  ONSET DATE: DOS  12/27/2023  SUBJECTIVE:  SUBJECTIVE STATEMENT: "I'm late"   Hand dominance: Right  PERTINENT HISTORY: R reverse total shoulder arthroplasty  12/27/23  L4-S1 fusion on 05/07/2023.  Pt has had 2 prior lumbar surgeries.   Hx of 5 cervical surgeries including cervical fusion C2-T1.   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  Memory difficulty   Hx of PE in 2016 and 2021 HTN  PAIN:  NPRS:  a little pain, but not bad Location:  R shoulder primarily anterior and lateral.  PRECAUTIONS: Other: Reverse shoulder arthoplasty protocol, Lumbar fusion, peripheral neuropathy, hx of cervical fusion, CVA   WEIGHT BEARING RESTRICTIONS: Yes R  UE  FALLS:  Has patient fallen in last 6 months? No  LIVING ENVIRONMENT: Lives with: lives with their family Lives in: 2 story home, but stays on 1st floor mainly Stairs: yes  OCCUPATION: Pt is retired.  PLOF: Independent  PATIENT GOALS:  to not have pain.  To have full use of R UE.   NEXT MD VISIT:   OBJECTIVE:  Note: Objective measures were completed at Evaluation unless otherwise noted.  DIAGNOSTIC FINDINGS:  Pt is post op.                                                                                                                             TREATMENT DATE:   No treatment given today.        PATIENT EDUCATION:   HOME EXERCISE PROGRAM: Access Code: [facility] URL: [email or web address] Date: 01/29/2024 Prepared by: Aaron Edelman  Updated HEP: - Supine Single Arm Shoulder Protraction  - 1 x daily - 7 x weekly - 2 sets - 10 reps - Supine Shoulder Alphabet  - 1-2 x daily - 7 x weekly - 1 rep  ASSESSMENT:  CLINICAL IMPRESSION: Pt arrived to PT treatment very late today.  PT offered to treat pt for 20 mins though pt pleasantly declined.      OBJECTIVE IMPAIRMENTS: decreased activity tolerance, decreased ROM, decreased strength, hypomobility, impaired flexibility, impaired UE functional use, and pain.   ACTIVITY LIMITATIONS: carrying, lifting, bathing, dressing, reach over head, and hygiene/grooming  PARTICIPATION LIMITATIONS: meal prep, cleaning, laundry, shopping, and community activity  PERSONAL FACTORS: 3+ comorbidities: L4-S1 fusion on 05/07/2023, cervical fusion, CVA, DM Type 2, peripheral neuropathy  are also affecting patient's functional outcome.   are also affecting patient's functional outcome.   REHAB POTENTIAL: Good  CLINICAL DECISION MAKING: Stable/uncomplicated  EVALUATION COMPLEXITY: Low   GOALS:   SHORT TERM GOALS: Target date:  02/26/24   Pt will be independent with HEP for improved pain, ROM, and function.    Baseline: Goal status: INITIAL  2.  Pt will progress with PROM per protocol and demo 115 deg in flexion, 90 deg in abd, and 45 deg in ER for improved mobility and stiffness.  Baseline:  Goal status: INITIAL  3.  Pt will progress with shoulder AAROM per protocol  without adverse effects for improved shoulder mobility and performance of daily activities.  Baseline:  Goal status: INITIAL  4.  Pt will demo R shoulder flexion AAROM in supine to 130 deg and ER AAROM to 40 deg for improved stiffness and mobility.   Baseline:  Goal status: INITIAL Target date:  03/11/24   5.  Pt will be able to actively elevate R shoulder > 90 deg without significant shoulder hike.  Baseline:  Goal status: INITIAL Target date:  03/25/24   6.  Pt will be able to perform her self care activities with no > than minimal difficulty and without significant pain.  Baseline:  Goal status: INITIAL Target date:  03/25/2024   LONG TERM GOALS: Target date: 04/22/2024   Pt will be able to perform his ADLs and IADLs without significant difficulty and pain.  Baseline:  Goal status: INITIAL  2.  Pt will be able to perform his normal reaching and functional overhead activities without significant pain and limitation.  Baseline:  Goal status: INITIAL  3.   Pt will demo L shoulder AROM to be [facility] t/o for performance of ADLs and IADLs.  Baseline:  Goal status: INITIAL  4.  Pt will be able to reach into an overhead cabinet without significant difficulty.  Baseline:  Goal status: INITIAL  5.  Pt will progress with strengthening exercises per protocol without adverse effects in order to perform his normal functional lifting/carrying activities and household chores.   Baseline:  Goal status: INITIAL   PLAN:  PT FREQUENCY: 2x/week  PT DURATION: 12 weeks  PLANNED INTERVENTIONS: 97164- PT Re-evaluation, 97110-Therapeutic exercises, 97530- Therapeutic activity, 97112- Neuromuscular re-education, 97535- Self Care,  97140- Manual therapy, [phone]- Aquatic Therapy, [id number]- Electrical stimulation (unattended), Patient/Family education, Taping, Dry Needling, Joint mobilization, Scar mobilization, Cryotherapy, and Moist heat  PLAN FOR NEXT SESSION: Cont per Dr. Dub Mikes Reverse Total Shoulder Arthroplasty protocol.  Review and perform HEP.  Possible STM to UT/cervical paraspinals.    Audie Clear III PT, DPT 03/11/24 6:52 AM

## 2024-03-12 ENCOUNTER — Ambulatory Visit (HOSPITAL_BASED_OUTPATIENT_CLINIC_OR_DEPARTMENT_OTHER): Payer: Medicare Other | Admitting: Physical Therapy

## 2024-03-12 DIAGNOSIS — M25511 Pain in right shoulder: Secondary | ICD-10-CM | POA: Diagnosis not present

## 2024-03-12 DIAGNOSIS — M25611 Stiffness of right shoulder, not elsewhere classified: Secondary | ICD-10-CM

## 2024-03-12 DIAGNOSIS — M6281 Muscle weakness (generalized): Secondary | ICD-10-CM

## 2024-03-12 NOTE — Therapy (Signed)
 OUTPATIENT PHYSICAL THERAPY SHOULDER TREATMENT   Patient Name: James Barber MRN: [id number] DOB:Mar 23, 1946, 78 y.o., male Today's Date: 03/12/2024  END OF SESSION: PT End of Session -   03/05/2024    Visit Number 8   Number of Visits 24   Date for PT Re-Evaluation 04/22/2024   Authorization Type [employer] MCR   PT Start Time  1454   PT Stop Time 1542   PT Time Calculation (min) 48 mins   Activity Tolerance Patient tolerated treatment well   Behavior During Therapy  WFL for tasks assessed/performed        Past Medical History:  Diagnosis Date   Arthritis    Bowel obstruction (HCC)    Cancer (HCC)    prostate- "everything removed and clear"   Complication of anesthesia    Hard to wake and spikes fever   Diabetes mellitus without complication (HCC)    GERD (gastroesophageal reflux disease)    pt has not had issues with this in "a long time"   History of pulmonary embolus (PE)    Hypertension    Hypothyroidism    Ischemic stroke (HCC) 12/2022   Sleep apnea    Past Surgical History:  Procedure Laterality Date   ABDOMINAL SURGERY     "had ileostomy for about 4 months and had it reversed- due to salmonella infection- this happened in [place]"   ANTERIOR CERVICAL DECOMP/DISCECTOMY FUSION  11/12/2012   Procedure: ANTERIOR CERVICAL DECOMPRESSION/DISCECTOMY FUSION 2 LEVELS;  Surgeon: Barnett Abu, MD;  Location: [facility];  Service: Neurosurgery;  Laterality: N/A;  Right Side approach Cervical six-seven,Cervical seven -thoracic one Anterior cervical decompression/diskectomy/fusion   BACK SURGERY  2023   x2 (outpatient with Dr. Danielle Dess)   CARPAL TUNNEL RELEASE Bilateral    CERVICAL FUSION     c 3-4, C 4-5, C5-6   HERNIA REPAIR     right inguinal   LAPAROTOMY N/A 03/22/2015   Procedure: DIAGONOSTIC LAPAROSCOPY, LAPROSCOPIC LYSIS OF ADHESIONS FOR ONE HOUR, DISTAL SMALL BOWEL RESECTION;  Surgeon: [name], MD;  Location: [facility];  Service: General;  Laterality: N/A;    REVERSE SHOULDER ARTHROPLASTY Right 12/27/2023   Procedure: REVERSE SHOULDER ARTHROPLASTY;  Surgeon: Francena Hanly, MD;  Location: [facility];  Service: Orthopedics;  Laterality: Right;    Patient Active Problem List   Diagnosis Date Noted   Spondylolisthesis at L4-L5 level 05/07/2023   Acute pulmonary embolus (HCC) 04/19/2015   Rash and nonspecific skin eruption 04/19/2015   Diabetes mellitus, type II (HCC) 03/30/2015   Elevated LFTs 03/30/2015   Abnormal liver function tests 03/30/2015   Incisional hernia, without obstruction or gangrene 03/21/2015   Recurrent SBO (small bowel obstruction) 03/15/2015   HYPERLIPIDEMIA 03/09/2008   Iron deficiency anemia 03/09/2008   GERD 03/09/2008   Bilateral inguinal hernia (BIH), Left > Right 03/09/2008     REFERRING PROVIDER: Francena Hanly, MD   REFERRING DIAG: ICD-10:  Z96.611  Presence of right artificial shoulder joint  THERAPY DIAG:  Right shoulder pain, unspecified chronicity  Stiffness of right shoulder, not elsewhere classified  Muscle weakness (generalized)  Rationale for Evaluation and Treatment: Rehabilitation  ONSET DATE: DOS  12/27/2023  SUBJECTIVE:  SUBJECTIVE STATEMENT: Pt is 9 weeks and 6 days s/p R reverse total shoulder arthroplasty.  Pt denies pain at rest.  Pt denies any adverse effects after prior Rx.  Pt states his shoulder is about the same, but has improved mobility.  Pt reports his neck is still tight.  Pt was performing more of his home exercises though hasn't been performing them over the last few days.       Hand dominance: Right  PERTINENT HISTORY: R reverse total shoulder arthroplasty  12/27/23  L4-S1 fusion on 05/07/2023.  Pt has had 2 prior lumbar surgeries.   Hx of 5 cervical surgeries including cervical fusion C2-T1.   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  Memory difficulty   Hx of PE in 2016 and 2021 HTN  PAIN:  NPRS:  a little pain, but not bad Location:  R shoulder primarily anterior and lateral.  PRECAUTIONS: Other: Reverse shoulder arthoplasty protocol, Lumbar fusion, peripheral neuropathy, hx of cervical fusion, CVA   WEIGHT BEARING RESTRICTIONS: Yes R UE  FALLS:  Has patient fallen in last 6 months? No  LIVING ENVIRONMENT: Lives with: lives with their family Lives in: 2 story home, but stays on 1st floor mainly Stairs: yes  OCCUPATION: Pt is retired.  PLOF: Independent  PATIENT GOALS:  to not have pain.  To have full use of R UE.   NEXT MD VISIT:   OBJECTIVE:  Note: Objective measures were completed at Evaluation unless otherwise noted.  DIAGNOSTIC FINDINGS:  Pt is post op.                                                                                                                             TREATMENT DATE:   Pt received R shoulder PROM in flexion, abd, and ER per pt and tissue tolerance within protocol ranges.  Pt performed: Seated pulleys in flexion and scaption approx 30 each Supine serratus punch without assistance 2x10 reps Supine shoulder ABC x 1 rep S/L shoulder abduction 2x10 S/L shoulder ER 3x10 Flexion AROM in reclined position 2x10   Manual Therapy: STM to R UT seated and minimally in L UT.       PATIENT EDUCATION: Education details: HEP, exercise form, post op and protocol limitations and restrictions, dx, relevant anatomy, and POC.   PT instructed pt to perform HEP.     Person educated: Patient Education method: Explanation, Demonstration, Tactile cues, Verbal cues, and handout Education comprehension: verbalized understanding, returned demonstration, verbal cues required, tactile cues required, and needs further education  HOME EXERCISE PROGRAM: Access Code:  [facility] URL: [email or web address] Date: 01/29/2024 Prepared by: Aaron Edelman  Updated HEP: - Supine Single Arm Shoulder Protraction  - 1 x daily - 7 x weekly - 2 sets - 10 reps - Supine Shoulder Alphabet  - 1-2 x daily - 7 x weekly - 1 rep  ASSESSMENT:  CLINICAL IMPRESSION:  Pt is improving with R shoulder ROM.  He is progressing with exercises per protocol.  Pt performed exercises per protocol well with cuing for correct form and positioning.  Pt performed supine serratus punch with good form today.  Pt was challenged with flexion AROM in reclined position and was limited in ROM.  PT updated HEP and gave pt a HEP handout.  Pt demonstrates good understanding of HEP.  Pt reports having increased tightness in neck.  PT palpated UT and he has soft tissue tightness in R UT > L UT.  PT performed STM to UT to improve tightness, mobility, and scapular mechanics.  He responded well to Rx having no pai after Rx.  Pt will benefit from continued skilled PT services per protocol to address impairments and improve overall function.  Pt's ER improved with increased reps of PROM. PT encouraged pt to be compliant with HEP.     OBJECTIVE IMPAIRMENTS: decreased activity tolerance, decreased ROM, decreased strength, hypomobility, impaired flexibility, impaired UE functional use, and pain.   ACTIVITY LIMITATIONS: carrying, lifting, bathing, dressing, reach over head, and hygiene/grooming  PARTICIPATION LIMITATIONS: meal prep, cleaning, laundry, shopping, and community activity  PERSONAL FACTORS: 3+ comorbidities: L4-S1 fusion on 05/07/2023, cervical fusion, CVA, DM Type 2, peripheral neuropathy  are also affecting patient's functional outcome.   are also affecting patient's functional outcome.   REHAB POTENTIAL: Good  CLINICAL DECISION MAKING: Stable/uncomplicated  EVALUATION COMPLEXITY: Low   GOALS:   SHORT TERM GOALS: Target date:  02/26/24   Pt will be independent with HEP for  improved pain, ROM, and function.   Baseline: Goal status: INITIAL  2.  Pt will progress with PROM per protocol and demo 115 deg in flexion, 90 deg in abd, and 45 deg in ER for improved mobility and stiffness.  Baseline:  Goal status: INITIAL  3.  Pt will progress with shoulder AAROM per protocol without adverse effects for improved shoulder mobility and performance of daily activities.  Baseline:  Goal status: INITIAL  4.  Pt will demo R shoulder flexion AAROM in supine to 130 deg and ER AAROM to 40 deg for improved stiffness and mobility.   Baseline:  Goal status: INITIAL Target date:  03/11/24   5.  Pt will be able to actively elevate R shoulder > 90 deg without significant shoulder hike.  Baseline:  Goal status: INITIAL Target date:  03/25/24   6.  Pt will be able to perform her self care activities with no > than minimal difficulty and without significant pain.  Baseline:  Goal status: INITIAL Target date:  03/25/2024   LONG TERM GOALS: Target date: 04/22/2024   Pt will be able to perform his ADLs and IADLs without significant difficulty and pain.  Baseline:  Goal status: INITIAL  2.  Pt will be able to perform his normal reaching and functional overhead activities without significant pain and limitation.  Baseline:  Goal status: INITIAL  3.   Pt will demo L shoulder AROM to be [facility] t/o for performance of ADLs and IADLs.  Baseline:  Goal status: INITIAL  4.  Pt will be able to reach into an overhead cabinet without significant difficulty.  Baseline:  Goal status: INITIAL  5.  Pt will progress with strengthening exercises per protocol without adverse effects in order to perform his normal functional lifting/carrying activities and household chores.   Baseline:  Goal status: INITIAL   PLAN:  PT FREQUENCY: 2x/week  PT DURATION: 12 weeks  PLANNED INTERVENTIONS: 29562-  PT Re-evaluation, 97110-Therapeutic exercises, 97530- Therapeutic activity, [id number]  Neuromuscular re-education, [phone]- Self Care, 60454- Manual therapy, [phone]- Aquatic Therapy, [phone]- Electrical stimulation (unattended), Patient/Family education, Taping, Dry Needling, Joint mobilization, Scar mobilization, Cryotherapy, and Moist heat  PLAN FOR NEXT SESSION: Cont per Dr. Dub Mikes Reverse Total Shoulder Arthroplasty protocol.  Review and perform HEP.  Possible STM to UT/cervical paraspinals.    Audie Clear III PT, DPT 03/12/24 3:02 PM

## 2024-03-13 ENCOUNTER — Encounter (HOSPITAL_BASED_OUTPATIENT_CLINIC_OR_DEPARTMENT_OTHER): Payer: Self-pay | Admitting: Physical Therapy

## 2024-03-16 NOTE — Therapy (Signed)
 OUTPATIENT PHYSICAL THERAPY SHOULDER TREATMENT  Progress Note Reporting Period 01/29/24 to 03/17/24  See note below for Objective Data and Assessment of Progress/Goals.       Patient Name: James Barber MRN: [id number] DOB:1946/05/10, 78 y.o., male Today's Date: 03/18/2024  END OF SESSION: PT End of Session -   03/17/2024    Visit Number 10   Number of Visits 26   Date for PT Re-Evaluation 05/12/2024   Authorization Type BCBS MCR   PT Start Time  1455   PT Stop Time 1537   PT Time Calculation (min) 42 mins   Activity Tolerance Patient tolerated treatment well   Behavior During Therapy  WFL for tasks assessed/performed        Past Medical History:  Diagnosis Date   Arthritis    Bowel obstruction (HCC)    Cancer (HCC)    prostate- "everything removed and clear"   Complication of anesthesia    Hard to wake and spikes fever   Diabetes mellitus without complication (HCC)    GERD (gastroesophageal reflux disease)    pt has not had issues with this in "a long time"   History of pulmonary embolus (PE)    Hypertension    Hypothyroidism    Ischemic stroke (HCC) 12/2022   Sleep apnea    Past Surgical History:  Procedure Laterality Date   ABDOMINAL SURGERY     "had ileostomy for about 4 months and had it reversed- due to salmonella infection- this happened in [place] "   ANTERIOR CERVICAL DECOMP/DISCECTOMY FUSION  11/12/2012   Procedure: ANTERIOR CERVICAL DECOMPRESSION/DISCECTOMY FUSION 2 LEVELS;  Surgeon: Elna Haggis, MD;  Location: [facility];  Service: Neurosurgery;  Laterality: N/A;  Right Side approach Cervical six-seven,Cervical seven -thoracic one Anterior cervical decompression/diskectomy/fusion   BACK SURGERY  2023   x2 (outpatient with Dr. Ellery Guthrie)   CARPAL TUNNEL RELEASE Bilateral    CERVICAL FUSION     c 3-4, C 4-5, C5-6   HERNIA REPAIR     right inguinal   LAPAROTOMY N/A 03/22/2015   Procedure: DIAGONOSTIC LAPAROSCOPY, LAPROSCOPIC LYSIS OF ADHESIONS  FOR ONE HOUR, DISTAL SMALL BOWEL RESECTION;  Surgeon: Aldean Hummingbird, MD;  Location: [facility];  Service: General;  Laterality: N/A;   REVERSE SHOULDER ARTHROPLASTY Right 12/27/2023   Procedure: REVERSE SHOULDER ARTHROPLASTY;  Surgeon: Ellard Gunning, MD;  Location: [facility];  Service: Orthopedics;  Laterality: Right;    Patient Active Problem List   Diagnosis Date Noted   Spondylolisthesis at L4-L5 level 05/07/2023   Acute pulmonary embolus (HCC) 04/19/2015   Rash and nonspecific skin eruption 04/19/2015   Diabetes mellitus, type II (HCC) 03/30/2015   Elevated LFTs 03/30/2015   Abnormal liver function tests 03/30/2015   Incisional hernia, without obstruction or gangrene 03/21/2015   Recurrent SBO (small bowel obstruction) 03/15/2015   HYPERLIPIDEMIA 03/09/2008   Iron deficiency anemia 03/09/2008   GERD 03/09/2008   Bilateral inguinal hernia (BIH), Left > Right 03/09/2008     REFERRING PROVIDER: Ellard Gunning, MD   REFERRING DIAG: ICD-10:  Z96.611  Presence of right artificial shoulder joint  THERAPY DIAG:  Right shoulder pain, unspecified chronicity  Stiffness of right shoulder, not elsewhere classified  Muscle weakness (generalized)  Rationale for Evaluation and Treatment: Rehabilitation  ONSET DATE: DOS  12/27/2023  SUBJECTIVE:  SUBJECTIVE STATEMENT: Pt is 11 weeks and 4 days s/p R reverse total shoulder arthroplasty.  Pt denies pain at rest.  Pt denies any adverse effects after prior Rx.  Pt reports he has improved mobility in shoulder and has less challenge with daily activities.  Pt reports "moderate at most" difficulty with ADLs.  Pt reports improved hair care including reaching toward head.  He has difficulty reaching other side of his head.  He has difficulty with donning belt.  Pt is not  performing overhead activities.  Pt states his back still bothers him.      Hand dominance: Right  PERTINENT HISTORY: R reverse total shoulder arthroplasty  12/27/23  L4-S1 fusion on 05/07/2023.  Pt has had 2 prior lumbar surgeries.   Hx of 5 cervical surgeries including cervical fusion C2-T1.   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  Memory difficulty   Hx of PE in 2016 and 2021 HTN  PAIN:  NPRS:  0/10 current, 4/10 worst Location:  R shoulder primarily anterior and lateral.  PRECAUTIONS: Other: Reverse shoulder arthoplasty protocol, Lumbar fusion, peripheral neuropathy, hx of cervical fusion, CVA   WEIGHT BEARING RESTRICTIONS: Yes R UE  FALLS:  Has patient fallen in last 6 months? No  LIVING ENVIRONMENT: Lives with: lives with their family Lives in: 2 story home, but stays on 1st floor mainly Stairs: yes  OCCUPATION: Pt is retired.  PLOF: Independent  PATIENT GOALS:  to not have pain.  To have full use of R UE.   NEXT MD VISIT:   OBJECTIVE:  Note: Objective measures were completed at Evaluation unless otherwise noted.  DIAGNOSTIC FINDINGS:  Pt is post op.                                                                                                                             TREATMENT DATE:   PATIENT SURVEYS:  UEFI:  36/80 /  48/80   UPPER EXTREMITY ROM:     ROM Right eval Left eval Right 3/5 Right 3/14 Right 3/18 Right 4/14  Shoulder flexion 90 PROM / 82 AAROM 169 AROM 107 PROM / 104-105 deg AAROM 115 PROM and AAROM, 107 AROM (supine) 115 PROM / 107 AROM (supine) 145 PROM / 131 supine / 117 in a reclined position. Standing AAROM 129 deg  Shoulder scaption   164 AROM         Shoulder abduction 65 PROM 158 AROM 79 PROM 90 PROM 90 PROM 115 PROM  Shoulder adduction             Shoulder internal rotation             Shoulder external rotation 16 PROM   34 PROM / 28 -  30 deg AAROM 37 PROM / 26 AAROM 43 PROM / 37 AAROM 55 PROM / 50 AROM  Elbow flexion  Elbow extension             Wrist flexion             Wrist extension             Wrist ulnar deviation             Wrist radial deviation             Wrist pronation             Wrist supination             (Blank rows = not tested)               Pt received R shoulder PROM in flexion, abd, and ER per pt and tissue tolerance within protocol ranges.  Pt performed: Seated pulleys in flexion and scaption approx 30 each Supine serratus punch without assistance 2x10 reps Supine shoulder ABC x 1 rep S/L shoulder abduction 2x10 S/L shoulder ER 3x10 Flexion AROM in reclined position x10         PATIENT EDUCATION: Education details: HEP, exercise form, post op and protocol limitations and restrictions, dx, relevant anatomy, and POC.   PT instructed pt to perform HEP.     Person educated: Patient Education method: Explanation, Demonstration, Tactile cues, Verbal cues, and handout Education comprehension: verbalized understanding, returned demonstration, verbal cues required, tactile cues required, and needs further education  HOME EXERCISE PROGRAM: Access Code: [facility] URL: [email or web address] Date: 01/29/2024 Prepared by: Marnie Siren    ASSESSMENT:  CLINICAL IMPRESSION: Pt is making good progress with shoulder ROM.  He does have tightness in shoulder ROM though improves with PROM and performance of exercises.  He is improving with shoulder PROM, AAROM, and AROM.  He is progressing with protocol and performing exercises in the clinic well with cuing and instruction.  Pt has increased his performance of home exercises though not consistently compliant with HEP.  PT encouraged pt to be compliant with HEP.  Pt has improved functional usage of R UE and reports "moderate at most" difficulty with ADLs.  Pt reports improved performance of hair care  and is able to reach his  head.  Pt is not performing overhead activities.  Pt has met STG's #1-4.  He demonstrates improved self perceived disability with UEFI improving from 36/80 initially to  48/80 currently.  He responded well to Rx having no c/o's after Rx.  Pt will benefit from continued skilled PT services per protocol to address impairments and goals and to assist with restoring desired level of function.    OBJECTIVE IMPAIRMENTS: decreased activity tolerance, decreased ROM, decreased strength, hypomobility, impaired flexibility, impaired UE functional use, and pain.   ACTIVITY LIMITATIONS: carrying, lifting, bathing, dressing, reach over head, and hygiene/grooming  PARTICIPATION LIMITATIONS: meal prep, cleaning, laundry, shopping, and community activity  PERSONAL FACTORS: 3+ comorbidities: L4-S1 fusion on 05/07/2023, cervical fusion, CVA, DM Type 2, peripheral neuropathy  are also affecting patient's functional outcome.   are also affecting patient's functional outcome.   REHAB POTENTIAL: Good  CLINICAL DECISION MAKING: Stable/uncomplicated  EVALUATION COMPLEXITY: Low   GOALS:   SHORT TERM GOALS: Target date:  02/26/24   Pt will be independent with HEP for improved pain, ROM, and function.   Baseline: Goal status: GOAL MET  4/14  2.  Pt will progress with PROM per protocol and demo 115 deg in flexion, 90 deg in abd, and 45 deg in ER for improved mobility and stiffness.  Baseline:  Goal status:  GOAL MET    3.  Pt will progress with shoulder AAROM per protocol without adverse effects for improved shoulder mobility and performance of daily activities.  Baseline:  Goal status: GOAL MET  4/14  4.  Pt will demo R shoulder flexion AAROM in supine to 130 deg and ER AAROM to 40 deg for improved stiffness and mobility.   Baseline:  Goal status:  GOAL MET  4/14 Target date:  03/11/24   5.  Pt will be able to actively elevate R shoulder > 90 deg without significant shoulder hike.  Baseline:  Goal status:   ONGOING Target date:  03/25/24   6.  Pt will be able to perform his self care activities with no > than minimal difficulty and without significant pain.  Baseline:  Goal status: PROGRESSING  4/14 Target date:  03/25/2024   LONG TERM GOALS: Target date:  05/12/2024    Pt will be able to perform his ADLs and IADLs without significant difficulty and pain.  Baseline:  Goal status: INITIAL  2.  Pt will be able to perform his normal reaching and functional overhead activities without significant pain and limitation.  Baseline:  Goal status: INITIAL  3.   Pt will demo L shoulder AROM to be [facility] t/o for performance of ADLs and IADLs.  Baseline:  Goal status: INITIAL  4.  Pt will be able to reach into an overhead cabinet without significant difficulty.  Baseline:  Goal status: INITIAL  5.  Pt will progress with strengthening exercises per protocol without adverse effects in order to perform his normal functional lifting/carrying activities and household chores.   Baseline:  Goal status: INITIAL   PLAN:  PT FREQUENCY: 2x/week  PT DURATION: 8 weeks  PLANNED INTERVENTIONS: 97164- PT Re-evaluation, 97110-Therapeutic exercises, 97530- Therapeutic activity, 97112- Neuromuscular re-education, 97535- Self Care, [id number]- Manual therapy, [phone]- Aquatic Therapy, [id number]- Electrical stimulation (unattended), Patient/Family education, Taping, Dry Needling, Joint mobilization, Scar mobilization, Cryotherapy, and Moist heat  PLAN FOR NEXT SESSION: Cont per Dr. Leandro Proffer Reverse Total Shoulder Arthroplasty protocol.  Review and perform HEP.  Possible STM to UT/cervical paraspinals.    Trina Fujita III PT, DPT 03/18/24 9:21 AM

## 2024-03-17 ENCOUNTER — Ambulatory Visit (HOSPITAL_BASED_OUTPATIENT_CLINIC_OR_DEPARTMENT_OTHER): Payer: Medicare Other | Admitting: Physical Therapy

## 2024-03-17 ENCOUNTER — Encounter (HOSPITAL_BASED_OUTPATIENT_CLINIC_OR_DEPARTMENT_OTHER): Payer: Self-pay | Admitting: Physical Therapy

## 2024-03-17 DIAGNOSIS — M25511 Pain in right shoulder: Secondary | ICD-10-CM

## 2024-03-17 DIAGNOSIS — M6281 Muscle weakness (generalized): Secondary | ICD-10-CM

## 2024-03-17 DIAGNOSIS — M25611 Stiffness of right shoulder, not elsewhere classified: Secondary | ICD-10-CM

## 2024-03-19 ENCOUNTER — Ambulatory Visit (HOSPITAL_BASED_OUTPATIENT_CLINIC_OR_DEPARTMENT_OTHER): Payer: Medicare Other | Admitting: Physical Therapy

## 2024-03-19 ENCOUNTER — Encounter (HOSPITAL_BASED_OUTPATIENT_CLINIC_OR_DEPARTMENT_OTHER): Payer: Self-pay | Admitting: Physical Therapy

## 2024-03-19 DIAGNOSIS — M25511 Pain in right shoulder: Secondary | ICD-10-CM

## 2024-03-19 DIAGNOSIS — M6281 Muscle weakness (generalized): Secondary | ICD-10-CM

## 2024-03-19 DIAGNOSIS — M25611 Stiffness of right shoulder, not elsewhere classified: Secondary | ICD-10-CM

## 2024-03-19 NOTE — Therapy (Signed)
 OUTPATIENT PHYSICAL THERAPY SHOULDER TREATMENT        Patient Name: James Barber MRN: [id number] DOB:09-Aug-1946, 78 y.o., male Today's Date: 03/19/2024  END OF SESSION: PT End of Session -   03/19/2024    Visit Number 11   Number of Visits 26   Date for PT Re-Evaluation 05/12/2024   Authorization Type [employer] MCR   PT Start Time  1451   PT Stop Time 1539   PT Time Calculation (min) 48 mins   Activity Tolerance Patient tolerated treatment well   Behavior During Therapy  WFL for tasks assessed/performed        Past Medical History:  Diagnosis Date   Arthritis    Bowel obstruction (HCC)    Cancer (HCC)    prostate- "everything removed and clear"   Complication of anesthesia    Hard to wake and spikes fever   Diabetes mellitus without complication (HCC)    GERD (gastroesophageal reflux disease)    pt has not had issues with this in "a long time"   History of pulmonary embolus (PE)    Hypertension    Hypothyroidism    Ischemic stroke (HCC) 12/2022   Sleep apnea    Past Surgical History:  Procedure Laterality Date   ABDOMINAL SURGERY     "had ileostomy for about 4 months and had it reversed- due to salmonella infection- this happened in [place] "   ANTERIOR CERVICAL DECOMP/DISCECTOMY FUSION  11/12/2012   Procedure: ANTERIOR CERVICAL DECOMPRESSION/DISCECTOMY FUSION 2 LEVELS;  Surgeon: Elna Haggis, MD;  Location: [facility];  Service: Neurosurgery;  Laterality: N/A;  Right Side approach Cervical six-seven,Cervical seven -thoracic one Anterior cervical decompression/diskectomy/fusion   BACK SURGERY  2023   x2 (outpatient with Dr. Ellery Guthrie)   CARPAL TUNNEL RELEASE Bilateral    CERVICAL FUSION     c 3-4, C 4-5, C5-6   HERNIA REPAIR     right inguinal   LAPAROTOMY N/A 03/22/2015   Procedure: DIAGONOSTIC LAPAROSCOPY, LAPROSCOPIC LYSIS OF ADHESIONS FOR ONE HOUR, DISTAL SMALL BOWEL RESECTION;  Surgeon: Aldean Hummingbird, MD;  Location: [facility];  Service: General;  Laterality:  N/A;   REVERSE SHOULDER ARTHROPLASTY Right 12/27/2023   Procedure: REVERSE SHOULDER ARTHROPLASTY;  Surgeon: Ellard Gunning, MD;  Location: [facility];  Service: Orthopedics;  Laterality: Right;    Patient Active Problem List   Diagnosis Date Noted   Spondylolisthesis at L4-L5 level 05/07/2023   Acute pulmonary embolus (HCC) 04/19/2015   Rash and nonspecific skin eruption 04/19/2015   Diabetes mellitus, type II (HCC) 03/30/2015   Elevated LFTs 03/30/2015   Abnormal liver function tests 03/30/2015   Incisional hernia, without obstruction or gangrene 03/21/2015   Recurrent SBO (small bowel obstruction) 03/15/2015   HYPERLIPIDEMIA 03/09/2008   Iron deficiency anemia 03/09/2008   GERD 03/09/2008   Bilateral inguinal hernia (BIH), Left > Right 03/09/2008     REFERRING PROVIDER: Ellard Gunning, MD   REFERRING DIAG: ICD-10:  Z96.611  Presence of right artificial shoulder joint  THERAPY DIAG:  Right shoulder pain, unspecified chronicity  Stiffness of right shoulder, not elsewhere classified  Muscle weakness (generalized)  Rationale for Evaluation and Treatment: Rehabilitation  ONSET DATE: DOS  12/27/2023  SUBJECTIVE:  SUBJECTIVE STATEMENT: Pt is 11 weeks and 6 days s/p R reverse total shoulder arthroplasty.  Pt denies pain at rest.  Pt denies any adverse effects after prior Rx.  Pt reports he has improved mobility in shoulder and has less challenge with daily activities.  Pt reports moderate difficulty with ADLs.  Pt has improved with performing hair care.  He has difficulty reaching other side of his head and body.  He has difficulty with donning belt.  Pt is not performing overhead activities.  Pt states he hasn't performed his HEP since last visit.  Pt sees MD on Monday.   Hand dominance:  Right  PERTINENT HISTORY: R reverse total shoulder arthroplasty  12/27/23  L4-S1 fusion on 05/07/2023.  Pt has had 2 prior lumbar surgeries.   Hx of 5 cervical surgeries including cervical fusion C2-T1.   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  Memory difficulty   Hx of PE in 2016 and 2021 HTN  PAIN:  NPRS:  0/10 current, 4/10 worst Location:  R shoulder primarily anterior and lateral.  PRECAUTIONS: Other: Reverse shoulder arthoplasty protocol, Lumbar fusion, peripheral neuropathy, hx of cervical fusion, CVA   WEIGHT BEARING RESTRICTIONS: Yes R UE  FALLS:  Has patient fallen in last 6 months? No  LIVING ENVIRONMENT: Lives with: lives with their family Lives in: 2 story home, but stays on 1st floor mainly Stairs: yes  OCCUPATION: Pt is retired.  PLOF: Independent  PATIENT GOALS:  to not have pain.  To have full use of R UE.   NEXT MD VISIT:   OBJECTIVE:  Note: Objective measures were completed at Evaluation unless otherwise noted.  DIAGNOSTIC FINDINGS:  Pt is post op.                                                                                                                             TREATMENT DATE:    UPPER EXTREMITY ROM:     ROM Right eval Left eval Right 3/5 Right 3/14 Right 3/18 Right 4/14 Right 4/16  Shoulder flexion 90 PROM / 82 AAROM 169 AROM 107 PROM / 104-105 deg AAROM 115 PROM and AAROM, 107 AROM (supine) 115 PROM / 107 AROM (supine) 145 PROM / 131 supine / 117 in a reclined position. Standing AAROM 129 deg 122 AROM in standing  Shoulder scaption   164 AROM          Shoulder abduction 65 PROM 158 AROM 79 PROM 90 PROM 90 PROM 115 PROM   Shoulder adduction              Shoulder internal rotation              Shoulder external rotation 16 PROM   34 PROM / 28 - 30 deg AAROM 37 PROM / 26 AAROM 43 PROM / 37 AAROM 55 PROM / 50 AROM   Elbow flexion  Elbow extension              Wrist flexion              Wrist extension              Wrist ulnar deviation              Wrist radial deviation              Wrist pronation              Wrist supination              (Blank rows = not tested)               Pt received R shoulder PROM in flexion, abd, and ER per pt and tissue tolerance within protocol ranges.  Pt performed: Seated pulleys in flexion and scaption approx 20-30 reps each Supine serratus punch with and without support from contralateral UE  x10 reps each Supine shoulder ABC x 1 rep S/L shoulder abduction 2x10 S/L shoulder ER 2x10 Standing Flexion AROM x6, 1x5 in front of mirror Standing wand scaption x10 in front of mirror Standing scaption AROM x 5 reps in front of mirror    Reviewed and updated HEP.  PT gave pt a HEP handout consisting of S/L ER and educated pt in correct form and appropriate frequency.   PATIENT EDUCATION: Education details: HEP, exercise form, post op and protocol limitations and restrictions, dx, relevant anatomy, and POC.   PT instructed pt to perform HEP.     Person educated: Patient Education method: Explanation, Demonstration, Tactile cues, Verbal cues, and handout Education comprehension: verbalized understanding, returned demonstration, verbal cues required, tactile cues required, and needs further education  HOME EXERCISE PROGRAM: Access Code: [facility] URL: [email or web address] Date: 01/29/2024 Prepared by: Aaron Edelman  Updated HEP: - Sidelying Shoulder External Rotation  - 1 x daily - 5-7 x weekly - 2 sets - 10 reps    ASSESSMENT:  CLINICAL IMPRESSION: Pt is improving with function and making good progress with shoulder ROM.  Pt had tightness in shoulder abd PROM today which improved with increased repetitions.  Pt is improving with UE elevation.  PT had pt perform standing elevation AROM in front of mirror and pt had 122 deg of flexion in standing.  Pt performed  exercises well with cuing for correct form and positioning.  Pt reports not performing HEP since prior Rx.  PT encouraged compliance with HEP.  Pt responded well to Rx and had no c/o's after Rx.   Pt will benefit from continued skilled PT services per protocol to address impairments and goals and to assist with restoring desired level of function.    OBJECTIVE IMPAIRMENTS: decreased activity tolerance, decreased ROM, decreased strength, hypomobility, impaired flexibility, impaired UE functional use, and pain.   ACTIVITY LIMITATIONS: carrying, lifting, bathing, dressing, reach over head, and hygiene/grooming  PARTICIPATION LIMITATIONS: meal prep, cleaning, laundry, shopping, and community activity  PERSONAL FACTORS: 3+ comorbidities: L4-S1 fusion on 05/07/2023, cervical fusion, CVA, DM Type 2, peripheral neuropathy  are also affecting patient's functional outcome.   are also affecting patient's functional outcome.   REHAB POTENTIAL: Good  CLINICAL DECISION MAKING: Stable/uncomplicated  EVALUATION COMPLEXITY: Low   GOALS:   SHORT TERM GOALS: Target date:  02/26/24   Pt will be independent with HEP for improved pain, ROM, and function.   Baseline: Goal status: GOAL MET  4/14  2.  Pt will progress with PROM  per protocol and demo 115 deg in flexion, 90 deg in abd, and 45 deg in ER for improved mobility and stiffness.  Baseline:  Goal status:  GOAL MET    3.  Pt will progress with shoulder AAROM per protocol without adverse effects for improved shoulder mobility and performance of daily activities.  Baseline:  Goal status: GOAL MET  4/14  4.  Pt will demo R shoulder flexion AAROM in supine to 130 deg and ER AAROM to 40 deg for improved stiffness and mobility.   Baseline:  Goal status:  GOAL MET  4/14 Target date:  03/11/24   5.  Pt will be able to actively elevate R shoulder > 90 deg without significant shoulder hike.  Baseline:  Goal status:  ONGOING Target date:  03/25/24   6.   Pt will be able to perform his self care activities with no > than minimal difficulty and without significant pain.  Baseline:  Goal status: PROGRESSING  4/14 Target date:  03/25/2024   LONG TERM GOALS: Target date:  05/12/2024    Pt will be able to perform his ADLs and IADLs without significant difficulty and pain.  Baseline:  Goal status: INITIAL  2.  Pt will be able to perform his normal reaching and functional overhead activities without significant pain and limitation.  Baseline:  Goal status: INITIAL  3.   Pt will demo L shoulder AROM to be [facility] t/o for performance of ADLs and IADLs.  Baseline:  Goal status: INITIAL  4.  Pt will be able to reach into an overhead cabinet without significant difficulty.  Baseline:  Goal status: INITIAL  5.  Pt will progress with strengthening exercises per protocol without adverse effects in order to perform his normal functional lifting/carrying activities and household chores.   Baseline:  Goal status: INITIAL   PLAN:  PT FREQUENCY: 2x/week  PT DURATION: 8 weeks  PLANNED INTERVENTIONS: 97164- PT Re-evaluation, 97110-Therapeutic exercises, 97530- Therapeutic activity, 97112- Neuromuscular re-education, 97535- Self Care, [id number]- Manual therapy, [phone]- Aquatic Therapy, [id number]- Electrical stimulation (unattended), Patient/Family education, Taping, Dry Needling, Joint mobilization, Scar mobilization, Cryotherapy, and Moist heat  PLAN FOR NEXT SESSION: Cont per Dr. Leandro Proffer Reverse Total Shoulder Arthroplasty protocol.  Review and perform HEP.  Possible STM to UT/cervical paraspinals.  Pt sees MD on Monday    Trina Fujita III PT, DPT 03/19/24 5:03 PM

## 2024-03-24 ENCOUNTER — Encounter (HOSPITAL_BASED_OUTPATIENT_CLINIC_OR_DEPARTMENT_OTHER): Payer: Medicare Other | Admitting: Physical Therapy

## 2024-03-26 ENCOUNTER — Ambulatory Visit (HOSPITAL_BASED_OUTPATIENT_CLINIC_OR_DEPARTMENT_OTHER): Payer: Medicare Other | Admitting: Physical Therapy

## 2024-03-26 ENCOUNTER — Encounter (HOSPITAL_BASED_OUTPATIENT_CLINIC_OR_DEPARTMENT_OTHER): Payer: Self-pay | Admitting: Physical Therapy

## 2024-03-26 DIAGNOSIS — M25511 Pain in right shoulder: Secondary | ICD-10-CM | POA: Diagnosis not present

## 2024-03-26 DIAGNOSIS — M6281 Muscle weakness (generalized): Secondary | ICD-10-CM

## 2024-03-26 DIAGNOSIS — M25611 Stiffness of right shoulder, not elsewhere classified: Secondary | ICD-10-CM

## 2024-03-26 NOTE — Therapy (Signed)
 OUTPATIENT PHYSICAL THERAPY SHOULDER TREATMENT        Patient Name: James Barber MRN: [id number] DOB:19-Jul-1946, 78 y.o., male Today's Date: 03/27/2024  END OF SESSION: PT End of Session -   03/26/2024    Visit Number 12   Number of Visits 26   Date for PT Re-Evaluation 05/12/2024   Authorization Type BCBS MCR   PT Start Time  1454   PT Stop Time 1538   PT Time Calculation (min) 44 mins   Activity Tolerance Patient tolerated treatment well   Behavior During Therapy  WFL for tasks assessed/performed        Past Medical History:  Diagnosis Date   Arthritis    Bowel obstruction (HCC)    Cancer (HCC)    prostate- "everything removed and clear"   Complication of anesthesia    Hard to wake and spikes fever   Diabetes mellitus without complication (HCC)    GERD (gastroesophageal reflux disease)    pt has not had issues with this in "a long time"   History of pulmonary embolus (PE)    Hypertension    Hypothyroidism    Ischemic stroke (HCC) 12/2022   Sleep apnea    Past Surgical History:  Procedure Laterality Date   ABDOMINAL SURGERY     "had ileostomy for about 4 months and had it reversed- due to salmonella infection- this happened in [place] "   ANTERIOR CERVICAL DECOMP/DISCECTOMY FUSION  11/12/2012   Procedure: ANTERIOR CERVICAL DECOMPRESSION/DISCECTOMY FUSION 2 LEVELS;  Surgeon: Elna Haggis, MD;  Location: [facility];  Service: Neurosurgery;  Laterality: N/A;  Right Side approach Cervical six-seven,Cervical seven -thoracic one Anterior cervical decompression/diskectomy/fusion   BACK SURGERY  2023   x2 (outpatient with Dr. Ellery Guthrie)   CARPAL TUNNEL RELEASE Bilateral    CERVICAL FUSION     c 3-4, C 4-5, C5-6   HERNIA REPAIR     right inguinal   LAPAROTOMY N/A 03/22/2015   Procedure: DIAGONOSTIC LAPAROSCOPY, LAPROSCOPIC LYSIS OF ADHESIONS FOR ONE HOUR, DISTAL SMALL BOWEL RESECTION;  Surgeon: Aldean Hummingbird, MD;  Location: [facility];  Service: General;  Laterality:  N/A;   REVERSE SHOULDER ARTHROPLASTY Right 12/27/2023   Procedure: REVERSE SHOULDER ARTHROPLASTY;  Surgeon: Ellard Gunning, MD;  Location: [facility];  Service: Orthopedics;  Laterality: Right;    Patient Active Problem List   Diagnosis Date Noted   Spondylolisthesis at L4-L5 level 05/07/2023   Acute pulmonary embolus (HCC) 04/19/2015   Rash and nonspecific skin eruption 04/19/2015   Diabetes mellitus, type II (HCC) 03/30/2015   Elevated LFTs 03/30/2015   Abnormal liver function tests 03/30/2015   Incisional hernia, without obstruction or gangrene 03/21/2015   Recurrent SBO (small bowel obstruction) 03/15/2015   HYPERLIPIDEMIA 03/09/2008   Iron deficiency anemia 03/09/2008   GERD 03/09/2008   Bilateral inguinal hernia (BIH), Left > Right 03/09/2008     REFERRING PROVIDER: Ellard Gunning, MD   REFERRING DIAG: ICD-10:  Z96.611  Presence of right artificial shoulder joint  THERAPY DIAG:  Right shoulder pain, unspecified chronicity  Stiffness of right shoulder, not elsewhere classified  Muscle weakness (generalized)  Rationale for Evaluation and Treatment: Rehabilitation  ONSET DATE: DOS  12/27/2023  SUBJECTIVE:  SUBJECTIVE STATEMENT: Pt is 12 weeks and 6 days s/p R reverse total shoulder arthroplasty.  Pt saw Dr. Alfredo Ano on Monday and he is pleased with pt's progress.  MD informed him for at least 1 more month of PT.  Pt denies shoulder pain currently.  Pt denies any adverse effects after prior Rx.  Pt has been performing S/L ER at home though not his other home exercises.  Pt reports difficulty with donning belt and reaching across body to apply deodorant.    Hand dominance: Right  PERTINENT HISTORY: R reverse total shoulder arthroplasty  12/27/23  L4-S1 fusion on 05/07/2023.  Pt has had 2 prior  lumbar surgeries.   Hx of 5 cervical surgeries including cervical fusion C2-T1.   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  Memory difficulty   Hx of PE in 2016 and 2021 HTN  PAIN:  NPRS:  0/10 current, 4/10 worst Location:  R shoulder primarily anterior and lateral.  PRECAUTIONS: Other: Reverse shoulder arthoplasty protocol, Lumbar fusion, peripheral neuropathy, hx of cervical fusion, CVA   WEIGHT BEARING RESTRICTIONS: Yes R UE  FALLS:  Has patient fallen in last 6 months? No  LIVING ENVIRONMENT: Lives with: lives with their family Lives in: 2 story home, but stays on 1st floor mainly Stairs: yes  OCCUPATION: Pt is retired.  PLOF: Independent  PATIENT GOALS:  to not have pain.  To have full use of R UE.   NEXT MD VISIT:   OBJECTIVE:  Note: Objective measures were completed at Evaluation unless otherwise noted.  DIAGNOSTIC FINDINGS:  Pt is post op.                                                                                                                             TREATMENT DATE:    UPPER EXTREMITY ROM:     ROM Right eval Left eval Right 3/5 Right 3/14 Right 3/18 Right 4/14 Right 4/16  Shoulder flexion 90 PROM / 82 AAROM 169 AROM 107 PROM / 104-105 deg AAROM 115 PROM and AAROM, 107 AROM (supine) 115 PROM / 107 AROM (supine) 145 PROM / 131 supine / 117 in a reclined position. Standing AAROM 129 deg 122 AROM in standing  Shoulder scaption   164 AROM          Shoulder abduction 65 PROM 158 AROM 79 PROM 90 PROM 90 PROM 115 PROM   Shoulder adduction              Shoulder internal rotation              Shoulder external rotation 16 PROM   34 PROM / 28 - 30 deg AAROM 37 PROM / 26 AAROM 43 PROM / 37 AAROM 55 PROM / 50 AROM   Elbow flexion              Elbow extension  Wrist flexion              Wrist extension              Wrist ulnar deviation               Wrist radial deviation              Wrist pronation              Wrist supination              (Blank rows = not tested)               Pt received R shoulder PROM in flexion, abd, ER, and IR per pt and tissue tolerance within protocol ranges.  Pt performed: Seated pulleys in flexion and scaption approx 20-30 reps each Supine serratus punch x20 Supine shoulder ABC x 1 rep with 1# S/L shoulder abduction 2x10 S/L shoulder ER 2x10-12 Standing Flexion AROM 1x5 in front of mirror Standing functional IR AROM x 10 reps Standing jobe's flexion 2x10 in front of mirror Standing scaption 2x10 in front of mirror ER with arm at side with YTB x 10 reps Rows with retraction 2x10 with YTB   PATIENT EDUCATION: Education details: HEP, exercise form, post op and protocol limitations and restrictions, dx, relevant anatomy, and POC.   PT instructed pt to perform HEP.     Person educated: Patient Education method: Explanation, Demonstration, Tactile cues, Verbal cues, and handout Education comprehension: verbalized understanding, returned demonstration, verbal cues required, tactile cues required, and needs further education  HOME EXERCISE PROGRAM: Access Code: [facility] URL: [email or web address] Date: 01/29/2024 Prepared by: Marnie Siren  Updated HEP: - Sidelying Shoulder External Rotation  - 1 x daily - 5-7 x weekly - 2 sets - 10 reps    ASSESSMENT:  CLINICAL IMPRESSION: Pt is improving with function and making good progress with shoulder ROM.  Pt had tightness in shoulder ROM which improved with increased reps of PROM.  He tolerated PROM well without c/o's.  PT progressed exercises per protocol and pt tolerated progression well.  PT added light resistance to select exercises and increased AROM in standing today.  PT also added functional IR AROM and performed IR PROM w/n pt and tissue tolerance and pt tolerated it well.  Pt performed exercises well with cuing for correct form  and positioning.  PT continued with encouragement to perform HEP.  Pt responded well to Rx and had no c/o's and no pain after Rx.   Pt will benefit from continued skilled PT services per protocol to address impairments and goals and to assist with restoring desired level of function.     OBJECTIVE IMPAIRMENTS: decreased activity tolerance, decreased ROM, decreased strength, hypomobility, impaired flexibility, impaired UE functional use, and pain.   ACTIVITY LIMITATIONS: carrying, lifting, bathing, dressing, reach over head, and hygiene/grooming  PARTICIPATION LIMITATIONS: meal prep, cleaning, laundry, shopping, and community activity  PERSONAL FACTORS: 3+ comorbidities: L4-S1 fusion on 05/07/2023, cervical fusion, CVA, DM Type 2, peripheral neuropathy  are also affecting patient's functional outcome.   are also affecting patient's functional outcome.   REHAB POTENTIAL: Good  CLINICAL DECISION MAKING: Stable/uncomplicated  EVALUATION COMPLEXITY: Low   GOALS:   SHORT TERM GOALS: Target date:  02/26/24   Pt will be independent with HEP for improved pain, ROM, and function.   Baseline: Goal status: GOAL MET  4/14  2.  Pt will progress with PROM per protocol and demo 115 deg in  flexion, 90 deg in abd, and 45 deg in ER for improved mobility and stiffness.  Baseline:  Goal status:  GOAL MET    3.  Pt will progress with shoulder AAROM per protocol without adverse effects for improved shoulder mobility and performance of daily activities.  Baseline:  Goal status: GOAL MET  4/14  4.  Pt will demo R shoulder flexion AAROM in supine to 130 deg and ER AAROM to 40 deg for improved stiffness and mobility.   Baseline:  Goal status:  GOAL MET  4/14 Target date:  03/11/24   5.  Pt will be able to actively elevate R shoulder > 90 deg without significant shoulder hike.  Baseline:  Goal status:  ONGOING Target date:  03/25/24   6.  Pt will be able to perform his self care activities with no >  than minimal difficulty and without significant pain.  Baseline:  Goal status: PROGRESSING  4/14 Target date:  03/25/2024   LONG TERM GOALS: Target date:  05/12/2024    Pt will be able to perform his ADLs and IADLs without significant difficulty and pain.  Baseline:  Goal status: INITIAL  2.  Pt will be able to perform his normal reaching and functional overhead activities without significant pain and limitation.  Baseline:  Goal status: INITIAL  3.   Pt will demo L shoulder AROM to be [facility] t/o for performance of ADLs and IADLs.  Baseline:  Goal status: INITIAL  4.  Pt will be able to reach into an overhead cabinet without significant difficulty.  Baseline:  Goal status: INITIAL  5.  Pt will progress with strengthening exercises per protocol without adverse effects in order to perform his normal functional lifting/carrying activities and household chores.   Baseline:  Goal status: INITIAL   PLAN:  PT FREQUENCY: 2x/week  PT DURATION: 8 weeks  PLANNED INTERVENTIONS: 97164- PT Re-evaluation, 97110-Therapeutic exercises, 97530- Therapeutic activity, 97112- Neuromuscular re-education, 97535- Self Care, [id number]- Manual therapy, [phone]- Aquatic Therapy, [id number]- Electrical stimulation (unattended), Patient/Family education, Taping, Dry Needling, Joint mobilization, Scar mobilization, Cryotherapy, and Moist heat  PLAN FOR NEXT SESSION: Cont per Dr. Leandro Proffer Reverse Total Shoulder Arthroplasty protocol.  Review and perform HEP.  Possible STM to UT/cervical paraspinals.      Trina Fujita III PT, DPT 03/27/24 9:28 PM

## 2024-03-31 ENCOUNTER — Ambulatory Visit (HOSPITAL_BASED_OUTPATIENT_CLINIC_OR_DEPARTMENT_OTHER): Payer: Medicare Other | Admitting: Physical Therapy

## 2024-03-31 ENCOUNTER — Encounter (HOSPITAL_BASED_OUTPATIENT_CLINIC_OR_DEPARTMENT_OTHER): Payer: Self-pay | Admitting: Physical Therapy

## 2024-03-31 DIAGNOSIS — M25511 Pain in right shoulder: Secondary | ICD-10-CM

## 2024-03-31 DIAGNOSIS — M25611 Stiffness of right shoulder, not elsewhere classified: Secondary | ICD-10-CM

## 2024-03-31 DIAGNOSIS — M6281 Muscle weakness (generalized): Secondary | ICD-10-CM

## 2024-03-31 NOTE — Therapy (Signed)
 OUTPATIENT PHYSICAL THERAPY SHOULDER TREATMENT        Patient Name: James Barber MRN: [id number] DOB:June 30, 1946, 78 y.o., male Today's Date: 04/01/2024  END OF SESSION: PT End of Session -   04/01/2024    Visit Number 13   Number of Visits 26   Date for PT Re-Evaluation 05/12/2024   Authorization Type BCBS MCR   PT Start Time  1456   PT Stop Time 1540   PT Time Calculation (min) 44 mins   Activity Tolerance Patient tolerated treatment well   Behavior During Therapy  WFL for tasks assessed/performed        Past Medical History:  Diagnosis Date   Arthritis    Bowel obstruction (HCC)    Cancer (HCC)    prostate- "everything removed and clear"   Complication of anesthesia    Hard to wake and spikes fever   Diabetes mellitus without complication (HCC)    GERD (gastroesophageal reflux disease)    pt has not had issues with this in "a long time"   History of pulmonary embolus (PE)    Hypertension    Hypothyroidism    Ischemic stroke (HCC) 12/2022   Sleep apnea    Past Surgical History:  Procedure Laterality Date   ABDOMINAL SURGERY     "had ileostomy for about 4 months and had it reversed- due to salmonella infection- this happened in [place] "   ANTERIOR CERVICAL DECOMP/DISCECTOMY FUSION  11/12/2012   Procedure: ANTERIOR CERVICAL DECOMPRESSION/DISCECTOMY FUSION 2 LEVELS;  Surgeon: Elna Haggis, MD;  Location: [facility];  Service: Neurosurgery;  Laterality: N/A;  Right Side approach Cervical six-seven,Cervical seven -thoracic one Anterior cervical decompression/diskectomy/fusion   BACK SURGERY  2023   x2 (outpatient with Dr. Ellery Guthrie)   CARPAL TUNNEL RELEASE Bilateral    CERVICAL FUSION     c 3-4, C 4-5, C5-6   HERNIA REPAIR     right inguinal   LAPAROTOMY N/A 03/22/2015   Procedure: DIAGONOSTIC LAPAROSCOPY, LAPROSCOPIC LYSIS OF ADHESIONS FOR ONE HOUR, DISTAL SMALL BOWEL RESECTION;  Surgeon: Aldean Hummingbird, MD;  Location: [facility];  Service: General;  Laterality:  N/A;   REVERSE SHOULDER ARTHROPLASTY Right 12/27/2023   Procedure: REVERSE SHOULDER ARTHROPLASTY;  Surgeon: Ellard Gunning, MD;  Location: [facility];  Service: Orthopedics;  Laterality: Right;    Patient Active Problem List   Diagnosis Date Noted   Spondylolisthesis at L4-L5 level 05/07/2023   Acute pulmonary embolus (HCC) 04/19/2015   Rash and nonspecific skin eruption 04/19/2015   Diabetes mellitus, type II (HCC) 03/30/2015   Elevated LFTs 03/30/2015   Abnormal liver function tests 03/30/2015   Incisional hernia, without obstruction or gangrene 03/21/2015   Recurrent SBO (small bowel obstruction) 03/15/2015   HYPERLIPIDEMIA 03/09/2008   Iron deficiency anemia 03/09/2008   GERD 03/09/2008   Bilateral inguinal hernia (BIH), Left > Right 03/09/2008     REFERRING PROVIDER: Ellard Gunning, MD   REFERRING DIAG: ICD-10:  Z96.611  Presence of right artificial shoulder joint  THERAPY DIAG:  Right shoulder pain, unspecified chronicity  Stiffness of right shoulder, not elsewhere classified  Muscle weakness (generalized)  Rationale for Evaluation and Treatment: Rehabilitation  ONSET DATE: DOS  12/27/2023  SUBJECTIVE:  SUBJECTIVE STATEMENT: Pt is 13 weeks and 4 days s/p R reverse total shoulder arthroplasty.  Pt states Dr. Alfredo Ano informed him to continue PT for about another month.  Pt has difficulty putting his R hand in the pocket of the pants he wears on Sunday's due to the location of the pocket.     Hand dominance: Right  PERTINENT HISTORY: R reverse total shoulder arthroplasty  12/27/23  L4-S1 fusion on 05/07/2023.  Pt has had 2 prior lumbar surgeries.   Hx of 5 cervical surgeries including cervical fusion C2-T1.   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  Memory difficulty   Hx of PE in 2016 and 2021 HTN  PAIN:  NPRS:  0/10 current, 4/10 worst Location:  R shoulder primarily anterior and lateral.  PRECAUTIONS: Other: Reverse shoulder arthoplasty protocol, Lumbar fusion, peripheral neuropathy, hx of cervical fusion, CVA   WEIGHT BEARING RESTRICTIONS: Yes R UE  FALLS:  Has patient fallen in last 6 months? No  LIVING ENVIRONMENT: Lives with: lives with their family Lives in: 2 story home, but stays on 1st floor mainly Stairs: yes  OCCUPATION: Pt is retired.  PLOF: Independent  PATIENT GOALS:  to not have pain.  To have full use of R UE.   NEXT MD VISIT:   OBJECTIVE:  Note: Objective measures were completed at Evaluation unless otherwise noted.  DIAGNOSTIC FINDINGS:  Pt is post op.                                                                                                                             TREATMENT DATE:    UPPER EXTREMITY ROM:     ROM Right eval Left eval Right 3/5 Right 3/14 Right 3/18 Right 4/14 Right 4/16  Shoulder flexion 90 PROM / 82 AAROM 169 AROM 107 PROM / 104-105 deg AAROM 115 PROM and AAROM, 107 AROM (supine) 115 PROM / 107 AROM (supine) 145 PROM / 131 supine / 117 in a reclined position. Standing AAROM 129 deg 122 AROM in standing  Shoulder scaption   164 AROM          Shoulder abduction 65 PROM 158 AROM 79 PROM 90 PROM 90 PROM 115 PROM   Shoulder adduction              Shoulder internal rotation              Shoulder external rotation 16 PROM   34 PROM / 28 - 30 deg AAROM 37 PROM / 26 AAROM 43 PROM / 37 AAROM 55 PROM / 50 AROM   Elbow flexion              Elbow extension              Wrist flexion              Wrist extension  Wrist ulnar deviation              Wrist radial deviation              Wrist pronation              Wrist supination              (Blank rows = not tested)               Pt received R shoulder  PROM in flexion, abd, ER, and IR per pt and tissue tolerance within protocol ranges.  Pt performed: Seated pulleys in flexion and scaption approx 20-30 reps each Standing wand flexion in front of mirror x 10 reps Standing rows with retraction with YTB 3x10 Standing jobe's flexion x10 with 0#, 2x10 with 1# in front of mirror Standing wand scaption x 10 Standing scaption 2x10 in front of mirror ER with arm at side with YTB 2 x 10 reps Supine serratus punch x10 AROM, 1# x10 Supine shoulder ABC x 1 rep with 1# Standing functional IR AROM x 10 reps      PATIENT EDUCATION: Education details: HEP, exercise form, post op and protocol limitations and restrictions, progression of protocol, dx, relevant anatomy, and POC.   PT instructed pt to perform HEP.     Person educated: Patient Education method: Explanation, Demonstration, Tactile cues, Verbal cues, and handout Education comprehension: verbalized understanding, returned demonstration, verbal cues required, tactile cues required, and needs further education  HOME EXERCISE PROGRAM: Access Code: [facility] URL: [email or web address] Date: 01/29/2024 Prepared by: Marnie Siren  Updated HEP: - Sidelying Shoulder External Rotation  - 1 x daily - 5-7 x weekly - 2 sets - 10 reps    ASSESSMENT:  CLINICAL IMPRESSION: Pt tolerated PROM well without c/o's.  He had tightness in shoulder ROM which improved with increased reps of PROM.  Pt is progressing with exercises per protocol by adding light resistance to select exercises.  He is tolerating progression of protocol well.  He is improving with standing AROM and AAROM.  Pt performed exercises well with cuing for correct form and positioning.  PT encouraged pt to perform HEP and explained the importance of performing his HEP.  Pt responded well to Rx and had no c/o's and no pain after Rx.   Pt will benefit from continued skilled PT services per protocol to address impairments and  goals and to assist with restoring desired level of function.     OBJECTIVE IMPAIRMENTS: decreased activity tolerance, decreased ROM, decreased strength, hypomobility, impaired flexibility, impaired UE functional use, and pain.   ACTIVITY LIMITATIONS: carrying, lifting, bathing, dressing, reach over head, and hygiene/grooming  PARTICIPATION LIMITATIONS: meal prep, cleaning, laundry, shopping, and community activity  PERSONAL FACTORS: 3+ comorbidities: L4-S1 fusion on 05/07/2023, cervical fusion, CVA, DM Type 2, peripheral neuropathy  are also affecting patient's functional outcome.   are also affecting patient's functional outcome.   REHAB POTENTIAL: Good  CLINICAL DECISION MAKING: Stable/uncomplicated  EVALUATION COMPLEXITY: Low   GOALS:   SHORT TERM GOALS: Target date:  02/26/24   Pt will be independent with HEP for improved pain, ROM, and function.   Baseline: Goal status: GOAL MET  4/14  2.  Pt will progress with PROM per protocol and demo 115 deg in flexion, 90 deg in abd, and 45 deg in ER for improved mobility and stiffness.  Baseline:  Goal status:  GOAL MET    3.  Pt will progress with shoulder AAROM per  protocol without adverse effects for improved shoulder mobility and performance of daily activities.  Baseline:  Goal status: GOAL MET  4/14  4.  Pt will demo R shoulder flexion AAROM in supine to 130 deg and ER AAROM to 40 deg for improved stiffness and mobility.   Baseline:  Goal status:  GOAL MET  4/14 Target date:  03/11/24   5.  Pt will be able to actively elevate R shoulder > 90 deg without significant shoulder hike.  Baseline:  Goal status:  ONGOING Target date:  03/25/24   6.  Pt will be able to perform his self care activities with no > than minimal difficulty and without significant pain.  Baseline:  Goal status: PROGRESSING  4/14 Target date:  03/25/2024   LONG TERM GOALS: Target date:  05/12/2024    Pt will be able to perform his ADLs and IADLs  without significant difficulty and pain.  Baseline:  Goal status: INITIAL  2.  Pt will be able to perform his normal reaching and functional overhead activities without significant pain and limitation.  Baseline:  Goal status: INITIAL  3.   Pt will demo L shoulder AROM to be [facility] t/o for performance of ADLs and IADLs.  Baseline:  Goal status: INITIAL  4.  Pt will be able to reach into an overhead cabinet without significant difficulty.  Baseline:  Goal status: INITIAL  5.  Pt will progress with strengthening exercises per protocol without adverse effects in order to perform his normal functional lifting/carrying activities and household chores.   Baseline:  Goal status: INITIAL   PLAN:  PT FREQUENCY: 2x/week  PT DURATION: 8 weeks  PLANNED INTERVENTIONS: 97164- PT Re-evaluation, 97110-Therapeutic exercises, 97530- Therapeutic activity, 97112- Neuromuscular re-education, 97535- Self Care, [id number]- Manual therapy, [phone]- Aquatic Therapy, [id number]- Electrical stimulation (unattended), Patient/Family education, Taping, Dry Needling, Joint mobilization, Scar mobilization, Cryotherapy, and Moist heat  PLAN FOR NEXT SESSION: Cont per Dr. Leandro Proffer Reverse Total Shoulder Arthroplasty protocol.  Review and perform HEP.  Possible STM to UT/cervical paraspinals.      Trina Fujita III PT, DPT 04/01/24 9:04 PM

## 2024-04-02 ENCOUNTER — Encounter (HOSPITAL_BASED_OUTPATIENT_CLINIC_OR_DEPARTMENT_OTHER): Payer: Self-pay | Admitting: Physical Therapy

## 2024-04-02 ENCOUNTER — Ambulatory Visit (HOSPITAL_BASED_OUTPATIENT_CLINIC_OR_DEPARTMENT_OTHER): Payer: Medicare Other | Admitting: Physical Therapy

## 2024-04-02 DIAGNOSIS — M6281 Muscle weakness (generalized): Secondary | ICD-10-CM

## 2024-04-02 DIAGNOSIS — M25611 Stiffness of right shoulder, not elsewhere classified: Secondary | ICD-10-CM

## 2024-04-02 DIAGNOSIS — M25511 Pain in right shoulder: Secondary | ICD-10-CM | POA: Diagnosis not present

## 2024-04-02 NOTE — Therapy (Signed)
 OUTPATIENT PHYSICAL THERAPY SHOULDER TREATMENT        Patient Name: James Barber MRN: [id number] DOB:August 07, 1946, 78 y.o., male Today's Date: 04/03/2024  END OF SESSION: PT End of Session -   04/02/2024    Visit Number 14   Number of Visits 26   Date for PT Re-Evaluation 05/12/2024   Authorization Type BC[employer] MCR   PT Start Time  1459   PT Stop Time 1539   PT Time Calculation (min) 40 mins   Activity Tolerance Patient tolerated treatment well   Behavior During Therapy  WFL for tasks assessed/performed        Past Medical History:  Diagnosis Date   Arthritis    Bowel obstruction (HCC)    Cancer (HCC)    prostate- "everything removed and clear"   Complication of anesthesia    Hard to wake and spikes fever   Diabetes mellitus without complication (HCC)    GERD (gastroesophageal reflux disease)    pt has not had issues with this in "a long time"   History of pulmonary embolus (PE)    Hypertension    Hypothyroidism    Ischemic stroke (HCC) 12/2022   Sleep apnea    Past Surgical History:  Procedure Laterality Date   ABDOMINAL SURGERY     "had ileostomy for about 4 months and had it reversed- due to salmonella infection- this happened in [place] "   ANTERIOR CERVICAL DECOMP/DISCECTOMY FUSION  11/12/2012   Procedure: ANTERIOR CERVICAL DECOMPRESSION/DISCECTOMY FUSION 2 LEVELS;  Surgeon: Elna Haggis, MD;  Location: [facility];  Service: Neurosurgery;  Laterality: N/A;  Right Side approach Cervical six-seven,Cervical seven -thoracic one Anterior cervical decompression/diskectomy/fusion   BACK SURGERY  2023   x2 (outpatient with Dr. Ellery Guthrie)   CARPAL TUNNEL RELEASE Bilateral    CERVICAL FUSION     c 3-4, C 4-5, C5-6   HERNIA REPAIR     right inguinal   LAPAROTOMY N/A 03/22/2015   Procedure: DIAGONOSTIC LAPAROSCOPY, LAPROSCOPIC LYSIS OF ADHESIONS FOR ONE HOUR, DISTAL SMALL BOWEL RESECTION;  Surgeon: Aldean Hummingbird, MD;  Location: [facility];  Service: General;  Laterality:  N/A;   REVERSE SHOULDER ARTHROPLASTY Right 12/27/2023   Procedure: REVERSE SHOULDER ARTHROPLASTY;  Surgeon: Ellard Gunning, MD;  Location: [facility];  Service: Orthopedics;  Laterality: Right;    Patient Active Problem List   Diagnosis Date Noted   Spondylolisthesis at L4-L5 level 05/07/2023   Acute pulmonary embolus (HCC) 04/19/2015   Rash and nonspecific skin eruption 04/19/2015   Diabetes mellitus, type II (HCC) 03/30/2015   Elevated LFTs 03/30/2015   Abnormal liver function tests 03/30/2015   Incisional hernia, without obstruction or gangrene 03/21/2015   Recurrent SBO (small bowel obstruction) 03/15/2015   HYPERLIPIDEMIA 03/09/2008   Iron deficiency anemia 03/09/2008   GERD 03/09/2008   Bilateral inguinal hernia (BIH), Left > Right 03/09/2008     REFERRING PROVIDER: Ellard Gunning, MD   REFERRING DIAG: ICD-10:  Z96.611  Presence of right artificial shoulder joint  THERAPY DIAG:  Right shoulder pain, unspecified chronicity  Stiffness of right shoulder, not elsewhere classified  Muscle weakness (generalized)  Rationale for Evaluation and Treatment: Rehabilitation  ONSET DATE: DOS  12/27/2023  SUBJECTIVE:  SUBJECTIVE STATEMENT: Pt is 13 weeks and 6 days s/p R reverse total shoulder arthroplasty.  Pt states he was pretty sore after prior Rx though had no pain.  Pt denies pain currently.    Hand dominance: Right  PERTINENT HISTORY: R reverse total shoulder arthroplasty  12/27/23  L4-S1 fusion on 05/07/2023.  Pt has had 2 prior lumbar surgeries.   Hx of 5 cervical surgeries including cervical fusion C2-T1.   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  Memory difficulty   Hx of PE in 2016 and 2021 HTN  PAIN:   NPRS:  0/10 current, 4/10 worst Location:  R shoulder primarily anterior and lateral.  PRECAUTIONS: Other: Reverse shoulder arthoplasty protocol, Lumbar fusion, peripheral neuropathy, hx of cervical fusion, CVA   WEIGHT BEARING RESTRICTIONS: Yes R UE  FALLS:  Has patient fallen in last 6 months? No  LIVING ENVIRONMENT: Lives with: lives with their family Lives in: 2 story home, but stays on 1st floor mainly Stairs: yes  OCCUPATION: Pt is retired.  PLOF: Independent  PATIENT GOALS:  to not have pain.  To have full use of R UE.   NEXT MD VISIT:   OBJECTIVE:  Note: Objective measures were completed at Evaluation unless otherwise noted.  DIAGNOSTIC FINDINGS:  Pt is post op.                                                                                                                             TREATMENT DATE:    UPPER EXTREMITY ROM:     ROM Right eval Left eval Right 3/5 Right 3/14 Right 3/18 Right 4/14 Right 4/16  Shoulder flexion 90 PROM / 82 AAROM 169 AROM 107 PROM / 104-105 deg AAROM 115 PROM and AAROM, 107 AROM (supine) 115 PROM / 107 AROM (supine) 145 PROM / 131 supine / 117 in a reclined position. Standing AAROM 129 deg 122 AROM in standing  Shoulder scaption   164 AROM          Shoulder abduction 65 PROM 158 AROM 79 PROM 90 PROM 90 PROM 115 PROM   Shoulder adduction              Shoulder internal rotation              Shoulder external rotation 16 PROM   34 PROM / 28 - 30 deg AAROM 37 PROM / 26 AAROM 43 PROM / 37 AAROM 55 PROM / 50 AROM   Elbow flexion              Elbow extension              Wrist flexion              Wrist extension              Wrist ulnar deviation  Wrist radial deviation              Wrist pronation              Wrist supination              (Blank rows = not tested)               Pt received R shoulder PROM in flexion, abd, ER, and IR per pt and tissue tolerance within protocol ranges.  Pt  performed: Seated pulleys in flexion and scaption x 20 reps each Supine serratus punch x10 AROM, 1# x10 Supine shoulder ABC x 1 rep with 1# Supine rhythmic stab's at 90 deg flexion 3x30 sec Standing flexion AROM in front of mirror x6 and 5 reps Standing rows with retraction with YTB 3x10 Standing jobe's flexion 2x10 with 1# in front of mirror Standing scaption x10 in front of mirror Rows to neutral with retraction with YTB 2x10 ER with arm at side with YTB 2 x 10 reps Standing functional IR AROM x 10 and x 5reps      PATIENT EDUCATION: Education details: HEP, exercise form, post op and protocol limitations and restrictions, progression of protocol, dx, relevant anatomy, and POC.   PT instructed pt to perform HEP.     Person educated: Patient Education method: Explanation, Demonstration, Tactile cues, Verbal cues, and handout Education comprehension: verbalized understanding, returned demonstration, verbal cues required, tactile cues required, and needs further education  HOME EXERCISE PROGRAM: Access Code: [facility] URL: [email or web address] Date: 01/29/2024 Prepared by: Marnie Siren     ASSESSMENT:  CLINICAL IMPRESSION: Pt tolerated PROM well without c/o's.  Pt improves with shoulder ROM and tightness with increased reps of PROM.  Pt is performing select exercises with light resistance well and is progressing well with protocol.  He had good tolerance with exercise progression.  PT gave verbal, visual, and tactile cues for correct form with exercises and he performed exercises well with cuing.  He is improving with UE elevation in standing.  Pt responded well to Rx stating he had no pain, but  can tell he worked it after Rx.  Pt will benefit from continued skilled PT services per protocol to address impairments and goals and to assist with restoring desired level of function.       OBJECTIVE IMPAIRMENTS: decreased activity tolerance, decreased ROM, decreased  strength, hypomobility, impaired flexibility, impaired UE functional use, and pain.   ACTIVITY LIMITATIONS: carrying, lifting, bathing, dressing, reach over head, and hygiene/grooming  PARTICIPATION LIMITATIONS: meal prep, cleaning, laundry, shopping, and community activity  PERSONAL FACTORS: 3+ comorbidities: L4-S1 fusion on 05/07/2023, cervical fusion, CVA, DM Type 2, peripheral neuropathy  are also affecting patient's functional outcome.   are also affecting patient's functional outcome.   REHAB POTENTIAL: Good  CLINICAL DECISION MAKING: Stable/uncomplicated  EVALUATION COMPLEXITY: Low   GOALS:   SHORT TERM GOALS: Target date:  02/26/24   Pt will be independent with HEP for improved pain, ROM, and function.   Baseline: Goal status: GOAL MET  4/14  2.  Pt will progress with PROM per protocol and demo 115 deg in flexion, 90 deg in abd, and 45 deg in ER for improved mobility and stiffness.  Baseline:  Goal status:  GOAL MET    3.  Pt will progress with shoulder AAROM per protocol without adverse effects for improved shoulder mobility and performance of daily activities.  Baseline:  Goal status: GOAL MET  4/14  4.  Pt will  demo R shoulder flexion AAROM in supine to 130 deg and ER AAROM to 40 deg for improved stiffness and mobility.   Baseline:  Goal status:  GOAL MET  4/14 Target date:  03/11/24   5.  Pt will be able to actively elevate R shoulder > 90 deg without significant shoulder hike.  Baseline:  Goal status:  ONGOING Target date:  03/25/24   6.  Pt will be able to perform his self care activities with no > than minimal difficulty and without significant pain.  Baseline:  Goal status: PROGRESSING  4/14 Target date:  03/25/2024   LONG TERM GOALS: Target date:  05/12/2024    Pt will be able to perform his ADLs and IADLs without significant difficulty and pain.  Baseline:  Goal status: INITIAL  2.  Pt will be able to perform his normal reaching and functional  overhead activities without significant pain and limitation.  Baseline:  Goal status: INITIAL  3.   Pt will demo L shoulder AROM to be [facility] t/o for performance of ADLs and IADLs.  Baseline:  Goal status: INITIAL  4.  Pt will be able to reach into an overhead cabinet without significant difficulty.  Baseline:  Goal status: INITIAL  5.  Pt will progress with strengthening exercises per protocol without adverse effects in order to perform his normal functional lifting/carrying activities and household chores.   Baseline:  Goal status: INITIAL   PLAN:  PT FREQUENCY: 2x/week  PT DURATION: 8 weeks  PLANNED INTERVENTIONS: 97164- PT Re-evaluation, 97110-Therapeutic exercises, 97530- Therapeutic activity, 97112- Neuromuscular re-education, 97535- Self Care, [id number]- Manual therapy, [phone]- Aquatic Therapy, [id number]- Electrical stimulation (unattended), Patient/Family education, Taping, Dry Needling, Joint mobilization, Scar mobilization, Cryotherapy, and Moist heat  PLAN FOR NEXT SESSION: Cont per Dr. Leandro Proffer Reverse Total Shoulder Arthroplasty protocol.  Possible STM to UT/cervical paraspinals.      Trina Fujita III PT, DPT 04/03/24 12:51 PM

## 2024-04-06 NOTE — Therapy (Signed)
 OUTPATIENT PHYSICAL THERAPY SHOULDER TREATMENT        Patient Name: James Barber MRN: [id number] DOB:11-07-1946, 78 y.o., male Today's Date: 04/07/2024  END OF SESSION: PT End of Session -   04/07/2024    Visit Number 15   Number of Visits 26   Date for PT Re-Evaluation 05/12/2024   Authorization Type BCBS MCR   PT Start Time  40981   PT Stop Time 1155   PT Time Calculation (min) 40 mins   Activity Tolerance Patient tolerated treatment well   Behavior During Therapy  WFL for tasks assessed/performed        Past Medical History:  Diagnosis Date   Arthritis    Bowel obstruction (HCC)    Cancer (HCC)    prostate- "everything removed and clear"   Complication of anesthesia    Hard to wake and spikes fever   Diabetes mellitus without complication (HCC)    GERD (gastroesophageal reflux disease)    pt has not had issues with this in "a long time"   History of pulmonary embolus (PE)    Hypertension    Hypothyroidism    Ischemic stroke (HCC) 12/2022   Sleep apnea    Past Surgical History:  Procedure Laterality Date   ABDOMINAL SURGERY     "had ileostomy for about 4 months and had it reversed- due to salmonella infection- this happened in [place] "   ANTERIOR CERVICAL DECOMP/DISCECTOMY FUSION  11/12/2012   Procedure: ANTERIOR CERVICAL DECOMPRESSION/DISCECTOMY FUSION 2 LEVELS;  Surgeon: Elna Haggis, MD;  Location: [facility];  Service: Neurosurgery;  Laterality: N/A;  Right Side approach Cervical six-seven,Cervical seven -thoracic one Anterior cervical decompression/diskectomy/fusion   BACK SURGERY  2023   x2 (outpatient with Dr. Ellery Guthrie)   CARPAL TUNNEL RELEASE Bilateral    CERVICAL FUSION     c 3-4, C 4-5, C5-6   HERNIA REPAIR     right inguinal   LAPAROTOMY N/A 03/22/2015   Procedure: DIAGONOSTIC LAPAROSCOPY, LAPROSCOPIC LYSIS OF ADHESIONS FOR ONE HOUR, DISTAL SMALL BOWEL RESECTION;  Surgeon: Aldean Hummingbird, MD;  Location: [facility];  Service: General;  Laterality:  N/A;   REVERSE SHOULDER ARTHROPLASTY Right 12/27/2023   Procedure: REVERSE SHOULDER ARTHROPLASTY;  Surgeon: Ellard Gunning, MD;  Location: [facility];  Service: Orthopedics;  Laterality: Right;    Patient Active Problem List   Diagnosis Date Noted   Spondylolisthesis at L4-L5 level 05/07/2023   Acute pulmonary embolus (HCC) 04/19/2015   Rash and nonspecific skin eruption 04/19/2015   Diabetes mellitus, type II (HCC) 03/30/2015   Elevated LFTs 03/30/2015   Abnormal liver function tests 03/30/2015   Incisional hernia, without obstruction or gangrene 03/21/2015   Recurrent SBO (small bowel obstruction) 03/15/2015   HYPERLIPIDEMIA 03/09/2008   Iron deficiency anemia 03/09/2008   GERD 03/09/2008   Bilateral inguinal hernia (BIH), Left > Right 03/09/2008     REFERRING PROVIDER: Ellard Gunning, MD   REFERRING DIAG: ICD-10:  Z96.611  Presence of right artificial shoulder joint  THERAPY DIAG:  Right shoulder pain, unspecified chronicity  Stiffness of right shoulder, not elsewhere classified  Muscle weakness (generalized)  Rationale for Evaluation and Treatment: Rehabilitation  ONSET DATE: DOS  12/27/2023  SUBJECTIVE:  SUBJECTIVE STATEMENT: Pt is 14 weeks and 4 days s/p R reverse total shoulder arthroplasty.  Pt denies any adverse effects after prior Rx.  Pt denies pain currently.  Pt is challenged with reaching across body to apply deodorant and donning belt.     Hand dominance: Right  PERTINENT HISTORY: R reverse total shoulder arthroplasty  12/27/23  L4-S1 fusion on 05/07/2023.  Pt has had 2 prior lumbar surgeries.   Hx of 5 cervical surgeries including cervical fusion C2-T1.   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   Memory difficulty   Hx of PE in 2016 and 2021 HTN  PAIN:  NPRS:  0/10 current, 4/10 worst Location:  R shoulder primarily anterior and lateral.  PRECAUTIONS: Other: Reverse shoulder arthoplasty protocol, Lumbar fusion, peripheral neuropathy, hx of cervical fusion, CVA   WEIGHT BEARING RESTRICTIONS: Yes R UE  FALLS:  Has patient fallen in last 6 months? No  LIVING ENVIRONMENT: Lives with: lives with their family Lives in: 2 story home, but stays on 1st floor mainly Stairs: yes  OCCUPATION: Pt is retired.  PLOF: Independent  PATIENT GOALS:  to not have pain.  To have full use of R UE.   NEXT MD VISIT:   OBJECTIVE:  Note: Objective measures were completed at Evaluation unless otherwise noted.  DIAGNOSTIC FINDINGS:  Pt is post op.                                                                                                                             TREATMENT DATE:          5/5 Pt received R shoulder PROM in flexion, abd, ER, and IR per pt and tissue tolerance within protocol ranges.  Pt performed: Seated pulleys in flexion and scaption x 20 reps each Supine serratus punch 1# 2x12 Supine shoulder ABC x 1 rep with 1# Supine rhythmic stab's at 90 deg flexion 3x30 sec S/L shoulder abd 2x10 Standing flexion AROM in front of mirror x10 reps Standing jobe's flexion 2x10 with 1# in front of mirror Standing scaption x10 each 0# and with 1# in front of mirror Rows to neutral with retraction with RTB 2x10 ER with arm at side with YTB 2 x 10 reps Standing functional IR AROM x 10  Standing wand IR approx 10-12     PATIENT EDUCATION: Education details: HEP, exercise form, post op and protocol limitations and restrictions, progression of protocol, dx, relevant anatomy, and POC.  Rationale of HEP and instructed pt to continue with HEP.  PT answered pt's questions. Person educated: Patient Education method: Explanation, Demonstration, Tactile cues, Verbal cues, and  handout Education comprehension: verbalized understanding, returned demonstration, verbal cues required, tactile cues required, and needs further education  HOME EXERCISE PROGRAM: Access Code: [facility] URL: [email or web address] Date: 01/29/2024 Prepared by: Marnie Siren     ASSESSMENT:  CLINICAL IMPRESSION: Pt is progressing well with function  and reports limitations with reaching across body and reaching behind back.  PT is progressing exercises per protocol and pt is tolerating progression well.  He is improving with UE elevation.  Pt performed ther ex and neuro re-ed activities well with cuing for correct form and positioning.  He responded well to Rx having no pain and no c/o's after Rx.  Pt will benefit from continued skilled PT services per protocol to address impairments and goals and to assist with restoring desired level of function.      OBJECTIVE IMPAIRMENTS: decreased activity tolerance, decreased ROM, decreased strength, hypomobility, impaired flexibility, impaired UE functional use, and pain.   ACTIVITY LIMITATIONS: carrying, lifting, bathing, dressing, reach over head, and hygiene/grooming  PARTICIPATION LIMITATIONS: meal prep, cleaning, laundry, shopping, and community activity  PERSONAL FACTORS: 3+ comorbidities: L4-S1 fusion on 05/07/2023, cervical fusion, CVA, DM Type 2, peripheral neuropathy  are also affecting patient's functional outcome.   are also affecting patient's functional outcome.   REHAB POTENTIAL: Good  CLINICAL DECISION MAKING: Stable/uncomplicated  EVALUATION COMPLEXITY: Low   GOALS:   SHORT TERM GOALS: Target date:  02/26/24   Pt will be independent with HEP for improved pain, ROM, and function.   Baseline: Goal status: GOAL MET  4/14  2.  Pt will progress with PROM per protocol and demo 115 deg in flexion, 90 deg in abd, and 45 deg in ER for improved mobility and stiffness.  Baseline:  Goal status:  GOAL MET    3.  Pt  will progress with shoulder AAROM per protocol without adverse effects for improved shoulder mobility and performance of daily activities.  Baseline:  Goal status: GOAL MET  4/14  4.  Pt will demo R shoulder flexion AAROM in supine to 130 deg and ER AAROM to 40 deg for improved stiffness and mobility.   Baseline:  Goal status:  GOAL MET  4/14 Target date:  03/11/24   5.  Pt will be able to actively elevate R shoulder > 90 deg without significant shoulder hike.  Baseline:  Goal status:  ONGOING Target date:  03/25/24   6.  Pt will be able to perform his self care activities with no > than minimal difficulty and without significant pain.  Baseline:  Goal status: PROGRESSING  4/14 Target date:  03/25/2024   LONG TERM GOALS: Target date:  05/12/2024    Pt will be able to perform his ADLs and IADLs without significant difficulty and pain.  Baseline:  Goal status: INITIAL  2.  Pt will be able to perform his normal reaching and functional overhead activities without significant pain and limitation.  Baseline:  Goal status: INITIAL  3.   Pt will demo L shoulder AROM to be [facility] t/o for performance of ADLs and IADLs.  Baseline:  Goal status: INITIAL  4.  Pt will be able to reach into an overhead cabinet without significant difficulty.  Baseline:  Goal status: INITIAL  5.  Pt will progress with strengthening exercises per protocol without adverse effects in order to perform his normal functional lifting/carrying activities and household chores.   Baseline:  Goal status: INITIAL   PLAN:  PT FREQUENCY: 2x/week  PT DURATION: 8 weeks  PLANNED INTERVENTIONS: 97164- PT Re-evaluation, 97110-Therapeutic exercises, 97530- Therapeutic activity, 97112- Neuromuscular re-education, 97535- Self Care, 16109- Manual therapy, [phone]- Aquatic Therapy, [id number]- Electrical stimulation (unattended), Patient/Family education, Taping, Dry Needling, Joint mobilization, Scar mobilization, Cryotherapy, and  Moist heat  PLAN FOR NEXT SESSION: Cont per Dr. Leandro Proffer Reverse  Total Shoulder Arthroplasty protocol.  Possible STM to UT/cervical paraspinals.      Trina Fujita III PT, DPT 04/07/24 2:17 PM

## 2024-04-07 ENCOUNTER — Ambulatory Visit (HOSPITAL_BASED_OUTPATIENT_CLINIC_OR_DEPARTMENT_OTHER): Attending: Neurological Surgery | Admitting: Physical Therapy

## 2024-04-07 ENCOUNTER — Encounter (HOSPITAL_BASED_OUTPATIENT_CLINIC_OR_DEPARTMENT_OTHER): Payer: Self-pay | Admitting: Physical Therapy

## 2024-04-07 DIAGNOSIS — M25511 Pain in right shoulder: Secondary | ICD-10-CM | POA: Diagnosis present

## 2024-04-07 DIAGNOSIS — M6281 Muscle weakness (generalized): Secondary | ICD-10-CM | POA: Diagnosis present

## 2024-04-07 DIAGNOSIS — M25611 Stiffness of right shoulder, not elsewhere classified: Secondary | ICD-10-CM | POA: Diagnosis present

## 2024-04-10 ENCOUNTER — Ambulatory Visit (HOSPITAL_BASED_OUTPATIENT_CLINIC_OR_DEPARTMENT_OTHER): Admitting: Physical Therapy

## 2024-04-10 ENCOUNTER — Encounter (HOSPITAL_BASED_OUTPATIENT_CLINIC_OR_DEPARTMENT_OTHER): Payer: Self-pay | Admitting: Physical Therapy

## 2024-04-10 DIAGNOSIS — M25611 Stiffness of right shoulder, not elsewhere classified: Secondary | ICD-10-CM

## 2024-04-10 DIAGNOSIS — M25511 Pain in right shoulder: Secondary | ICD-10-CM

## 2024-04-10 DIAGNOSIS — M6281 Muscle weakness (generalized): Secondary | ICD-10-CM

## 2024-04-10 NOTE — Therapy (Signed)
 OUTPATIENT PHYSICAL THERAPY SHOULDER TREATMENT        Patient Name: JEMIL STGERMAIN MRN: [id number] DOB:22-May-1946, 78 y.o., male Today's Date: 04/11/2024  END OF SESSION: PT End of Session -   04/10/2024    Visit Number 16   Number of Visits 26   Date for PT Re-Evaluation 05/12/2024   Authorization Type BC[employer] MCR   PT Start Time  1318   PT Stop Time 1400   PT Time Calculation (min) 42 mins   Activity Tolerance Patient tolerated treatment well   Behavior During Therapy  WFL for tasks assessed/performed        Past Medical History:  Diagnosis Date   Arthritis    Bowel obstruction (HCC)    Cancer (HCC)    prostate- "everything removed and clear"   Complication of anesthesia    Hard to wake and spikes fever   Diabetes mellitus without complication (HCC)    GERD (gastroesophageal reflux disease)    pt has not had issues with this in "a long time"   History of pulmonary embolus (PE)    Hypertension    Hypothyroidism    Ischemic stroke (HCC) 12/2022   Sleep apnea    Past Surgical History:  Procedure Laterality Date   ABDOMINAL SURGERY     "had ileostomy for about 4 months and had it reversed- due to salmonella infection- this happened in [place] "   ANTERIOR CERVICAL DECOMP/DISCECTOMY FUSION  11/12/2012   Procedure: ANTERIOR CERVICAL DECOMPRESSION/DISCECTOMY FUSION 2 LEVELS;  Surgeon: Elna Haggis, MD;  Location: [facility];  Service: Neurosurgery;  Laterality: N/A;  Right Side approach Cervical six-seven,Cervical seven -thoracic one Anterior cervical decompression/diskectomy/fusion   BACK SURGERY  2023   x2 (outpatient with Dr. Ellery Guthrie)   CARPAL TUNNEL RELEASE Bilateral    CERVICAL FUSION     c 3-4, C 4-5, C5-6   HERNIA REPAIR     right inguinal   LAPAROTOMY N/A 03/22/2015   Procedure: DIAGONOSTIC LAPAROSCOPY, LAPROSCOPIC LYSIS OF ADHESIONS FOR ONE HOUR, DISTAL SMALL BOWEL RESECTION;  Surgeon: Aldean Hummingbird, MD;  Location: [facility];  Service: General;  Laterality: N/A;    REVERSE SHOULDER ARTHROPLASTY Right 12/27/2023   Procedure: REVERSE SHOULDER ARTHROPLASTY;  Surgeon: Ellard Gunning, MD;  Location: [facility];  Service: Orthopedics;  Laterality: Right;    Patient Active Problem List   Diagnosis Date Noted   Spondylolisthesis at L4-L5 level 05/07/2023   Acute pulmonary embolus (HCC) 04/19/2015   Rash and nonspecific skin eruption 04/19/2015   Diabetes mellitus, type II (HCC) 03/30/2015   Elevated LFTs 03/30/2015   Abnormal liver function tests 03/30/2015   Incisional hernia, without obstruction or gangrene 03/21/2015   Recurrent SBO (small bowel obstruction) 03/15/2015   HYPERLIPIDEMIA 03/09/2008   Iron deficiency anemia 03/09/2008   GERD 03/09/2008   Bilateral inguinal hernia (BIH), Left > Right 03/09/2008     REFERRING PROVIDER: Ellard Gunning, MD   REFERRING DIAG: ICD-10:  Z96.611  Presence of right artificial shoulder joint  THERAPY DIAG:  Right shoulder pain, unspecified chronicity  Stiffness of right shoulder, not elsewhere classified  Muscle weakness (generalized)  Rationale for Evaluation and Treatment: Rehabilitation  ONSET DATE: DOS  12/27/2023  SUBJECTIVE:  SUBJECTIVE STATEMENT: Pt is 15 weeks s/p R reverse total shoulder arthroplasty.  Pt states he had a controlled fall on his deck steps yesterday.  He controlled his fall and sat on the steps.  Pt reports his shoulder is fine.  He states it may have been sore a little though had no pain.  Pt denies any adverse effects after prior Rx.  Pt denies pain currently.  Pt is challenged with reaching across body to apply deodorant and donning belt.     Hand dominance: Right  PERTINENT HISTORY: R reverse total shoulder arthroplasty  12/27/23  L4-S1 fusion on 05/07/2023.  Pt has had 2 prior lumbar surgeries.    Hx of 5 cervical surgeries including cervical fusion C2-T1.   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  Memory difficulty   Hx of PE in 2016 and 2021 HTN  PAIN:  NPRS:  0/10 current, 4/10 worst Location:  R shoulder primarily anterior and lateral.  PRECAUTIONS: Other: Reverse shoulder arthoplasty protocol, Lumbar fusion, peripheral neuropathy, hx of cervical fusion, CVA   WEIGHT BEARING RESTRICTIONS: Yes R UE  FALLS:  Has patient fallen in last 6 months? No  LIVING ENVIRONMENT: Lives with: lives with their family Lives in: 2 story home, but stays on 1st floor mainly Stairs: yes  OCCUPATION: Pt is retired.  PLOF: Independent  PATIENT GOALS:  to not have pain.  To have full use of R UE.   NEXT MD VISIT:   OBJECTIVE:  Note: Objective measures were completed at Evaluation unless otherwise noted.  DIAGNOSTIC FINDINGS:  Pt is post op.                                                                                                                             TREATMENT DATE:          5/8 R shoulder AROM: Flexion:  121 deg Scaption:  105 deg  Pt received R shoulder PROM in flexion, abd, ER, and IR per pt and tissue tolerance within protocol ranges.  Pt performed: Seated pulleys in flexion, scaption, abd x 20 reps each Standing shoulder flexion AROM x10 reps in front of mirror Standing jobe's flexion x10 with 1#, x10 with 2# in front of mirror Standing scaption 2x10 1# in front of mirror Standing shoulder extension with YTB 2x10 Supine serratus punch 2# 2x10 Supine shoulder ABC x 1 rep with 1# Supine rhythmic stab's at 90 deg flexion 3x30 sec Rows to neutral with retraction with RTB 2x10 ER with arm at side with YTB 2 x 10 reps      PATIENT EDUCATION: Education details: HEP, exercise form, post op and protocol limitations and restrictions, progression of protocol, dx, relevant  anatomy, and POC.  Rationale of HEP and instructed pt to continue with HEP.  PT answered pt's questions. Person educated: Patient Education method: Explanation, Demonstration, Tactile cues, Verbal cues, and handout Education  comprehension: verbalized understanding, returned demonstration, verbal cues required, tactile cues required, and needs further education  HOME EXERCISE PROGRAM: Access Code: [facility] URL: [email or web address] Date: 01/29/2024 Prepared by: Marnie Siren     ASSESSMENT:  CLINICAL IMPRESSION: Pt is improving with UE elevation and AROM.  PT is progressing exercises per protocol and pt is tolerating progression well.  He performed exercises without c/o's.  Pt required cuing for correct form with scaption and demonstrated improved ease with 1#.  Pt was able to increase resistance to 2# with jobe's flexion and supine serratus punch.  Pt tolerated PROM well and has tightness at end ranges.  He responded well to Rx having no pain and no c/o's after Rx.  Pt will benefit from continued skilled PT services per protocol to address impairments and goals and to assist with restoring desired level of function.      OBJECTIVE IMPAIRMENTS: decreased activity tolerance, decreased ROM, decreased strength, hypomobility, impaired flexibility, impaired UE functional use, and pain.   ACTIVITY LIMITATIONS: carrying, lifting, bathing, dressing, reach over head, and hygiene/grooming  PARTICIPATION LIMITATIONS: meal prep, cleaning, laundry, shopping, and community activity  PERSONAL FACTORS: 3+ comorbidities: L4-S1 fusion on 05/07/2023, cervical fusion, CVA, DM Type 2, peripheral neuropathy  are also affecting patient's functional outcome.  are also affecting patient's functional outcome.   REHAB POTENTIAL: Good  CLINICAL DECISION MAKING: Stable/uncomplicated  EVALUATION COMPLEXITY: Low   GOALS:   SHORT TERM GOALS: Target date:  02/26/24   Pt will be independent with  HEP for improved pain, ROM, and function.   Baseline: Goal status: GOAL MET  4/14  2.  Pt will progress with PROM per protocol and demo 115 deg in flexion, 90 deg in abd, and 45 deg in ER for improved mobility and stiffness.  Baseline:  Goal status:  GOAL MET    3.  Pt will progress with shoulder AAROM per protocol without adverse effects for improved shoulder mobility and performance of daily activities.  Baseline:  Goal status: GOAL MET  4/14  4.  Pt will demo R shoulder flexion AAROM in supine to 130 deg and ER AAROM to 40 deg for improved stiffness and mobility.   Baseline:  Goal status:  GOAL MET  4/14 Target date:  03/11/24   5.  Pt will be able to actively elevate R shoulder > 90 deg without significant shoulder hike.  Baseline:  Goal status:  ONGOING Target date:  03/25/24   6.  Pt will be able to perform his self care activities with no > than minimal difficulty and without significant pain.  Baseline:  Goal status: PROGRESSING  4/14 Target date:  03/25/2024   LONG TERM GOALS: Target date:  05/12/2024    Pt will be able to perform his ADLs and IADLs without significant difficulty and pain.  Baseline:  Goal status: INITIAL  2.  Pt will be able to perform his normal reaching and functional overhead activities without significant pain and limitation.  Baseline:  Goal status: INITIAL  3.   Pt will demo L shoulder AROM to be [facility] t/o for performance of ADLs and IADLs.  Baseline:  Goal status: INITIAL  4.  Pt will be able to reach into an overhead cabinet without significant difficulty.  Baseline:  Goal status: INITIAL  5.  Pt will progress with strengthening exercises per protocol without adverse effects in order to perform his normal functional lifting/carrying activities and household chores.   Baseline:  Goal status: INITIAL   PLAN:  PT  FREQUENCY: 2x/week  PT DURATION: 8 weeks  PLANNED INTERVENTIONS: 46962- PT Re-evaluation, 97110-Therapeutic exercises,  97530- Therapeutic activity, 97112- Neuromuscular re-education, 97535- Self Care, [id number]- Manual therapy, [phone]- Aquatic Therapy, [phone]- Electrical stimulation (unattended), Patient/Family education, Taping, Dry Needling, Joint mobilization, Scar mobilization, Cryotherapy, and Moist heat  PLAN FOR NEXT SESSION: Cont per Dr. Leandro Proffer Reverse Total Shoulder Arthroplasty protocol.  Possible STM to UT/cervical paraspinals.      Trina Fujita III PT, DPT 04/11/24 9:22 PM

## 2024-04-11 ENCOUNTER — Encounter (HOSPITAL_BASED_OUTPATIENT_CLINIC_OR_DEPARTMENT_OTHER): Payer: Self-pay | Admitting: Physical Therapy

## 2024-04-21 ENCOUNTER — Encounter (HOSPITAL_BASED_OUTPATIENT_CLINIC_OR_DEPARTMENT_OTHER): Payer: Self-pay | Admitting: Physical Therapy

## 2024-04-21 ENCOUNTER — Ambulatory Visit (HOSPITAL_BASED_OUTPATIENT_CLINIC_OR_DEPARTMENT_OTHER): Admitting: Physical Therapy

## 2024-04-21 DIAGNOSIS — M25611 Stiffness of right shoulder, not elsewhere classified: Secondary | ICD-10-CM

## 2024-04-21 DIAGNOSIS — M25511 Pain in right shoulder: Secondary | ICD-10-CM | POA: Diagnosis not present

## 2024-04-21 DIAGNOSIS — M6281 Muscle weakness (generalized): Secondary | ICD-10-CM

## 2024-04-21 NOTE — Therapy (Signed)
 OUTPATIENT PHYSICAL THERAPY SHOULDER TREATMENT        Patient Name: James Barber MRN: [id number] DOB:January 08, 1946, 78 y.o., male Today's Date: 04/21/2024  END OF SESSION: PT End of Session -   04/21/2024    Visit Number 17   Number of Visits 26   Date for PT Re-Evaluation 05/12/2024   Authorization Type BCBS MCR   PT Start Time  1026   PT Stop Time 1110   PT Time Calculation (min) 44 mins   Activity Tolerance Patient tolerated treatment well   Behavior During Therapy  WFL for tasks assessed/performed        Past Medical History:  Diagnosis Date   Arthritis    Bowel obstruction (HCC)    Cancer (HCC)    prostate- "everything removed and clear"   Complication of anesthesia    Hard to wake and spikes fever   Diabetes mellitus without complication (HCC)    GERD (gastroesophageal reflux disease)    pt has not had issues with this in "a long time"   History of pulmonary embolus (PE)    Hypertension    Hypothyroidism    Ischemic stroke (HCC) 12/2022   Sleep apnea    Past Surgical History:  Procedure Laterality Date   ABDOMINAL SURGERY     "had ileostomy for about 4 months and had it reversed- due to salmonella infection- this happened in [place] "   ANTERIOR CERVICAL DECOMP/DISCECTOMY FUSION  11/12/2012   Procedure: ANTERIOR CERVICAL DECOMPRESSION/DISCECTOMY FUSION 2 LEVELS;  Surgeon: Elna Haggis, MD;  Location: [facility];  Service: Neurosurgery;  Laterality: N/A;  Right Side approach Cervical six-seven,Cervical seven -thoracic one Anterior cervical decompression/diskectomy/fusion   BACK SURGERY  2023   x2 (outpatient with Dr. Ellery Guthrie)   CARPAL TUNNEL RELEASE Bilateral    CERVICAL FUSION     c 3-4, C 4-5, C5-6   HERNIA REPAIR     right inguinal   LAPAROTOMY N/A 03/22/2015   Procedure: DIAGONOSTIC LAPAROSCOPY, LAPROSCOPIC LYSIS OF ADHESIONS FOR ONE HOUR, DISTAL SMALL BOWEL RESECTION;  Surgeon: Aldean Hummingbird, MD;  Location: [facility];  Service: General;  Laterality:  N/A;   REVERSE SHOULDER ARTHROPLASTY Right 12/27/2023   Procedure: REVERSE SHOULDER ARTHROPLASTY;  Surgeon: Ellard Gunning, MD;  Location: [facility];  Service: Orthopedics;  Laterality: Right;    Patient Active Problem List   Diagnosis Date Noted   Spondylolisthesis at L4-L5 level 05/07/2023   Acute pulmonary embolus (HCC) 04/19/2015   Rash and nonspecific skin eruption 04/19/2015   Diabetes mellitus, type II (HCC) 03/30/2015   Elevated LFTs 03/30/2015   Abnormal liver function tests 03/30/2015   Incisional hernia, without obstruction or gangrene 03/21/2015   Recurrent SBO (small bowel obstruction) 03/15/2015   HYPERLIPIDEMIA 03/09/2008   Iron deficiency anemia 03/09/2008   GERD 03/09/2008   Bilateral inguinal hernia (BIH), Left > Right 03/09/2008     REFERRING PROVIDER: Ellard Gunning, MD   REFERRING DIAG: ICD-10:  Z96.611  Presence of right artificial shoulder joint  THERAPY DIAG:  Right shoulder pain, unspecified chronicity  Stiffness of right shoulder, not elsewhere classified  Muscle weakness (generalized)  Rationale for Evaluation and Treatment: Rehabilitation  ONSET DATE: DOS  12/27/2023  SUBJECTIVE:  SUBJECTIVE STATEMENT: Pt is 15 weeks s/p R reverse total shoulder arthroplasty.  Pt states his back is bothering him.  Pt states his shoulder feels fine.  Pt reports he hasn't been performing his home exercises recently.  Pt can't remember how he felt after prior Rx.  Pt denies shoulder pain currently.  Pt is challenged with reaching across body to apply deodorant and donning belt.     Hand dominance: Right  PERTINENT HISTORY: R reverse total shoulder arthroplasty  12/27/23  L4-S1 fusion on 05/07/2023.  Pt has had 2 prior lumbar surgeries.   Hx of 5 cervical surgeries including cervical  fusion C2-T1.   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  Memory difficulty   Hx of PE in 2016 and 2021 HTN  PAIN:  NPRS:  0/10 current, 4/10 worst Location:  R shoulder primarily anterior and lateral.  PRECAUTIONS: Other: Reverse shoulder arthoplasty protocol, Lumbar fusion, peripheral neuropathy, hx of cervical fusion, CVA   WEIGHT BEARING RESTRICTIONS: Yes R UE  FALLS:  Has patient fallen in last 6 months? No  LIVING ENVIRONMENT: Lives with: lives with their family Lives in: 2 story home, but stays on 1st floor mainly Stairs: yes  OCCUPATION: Pt is retired.  PLOF: Independent  PATIENT GOALS:  to not have pain.  To have full use of R UE.   NEXT MD VISIT:   OBJECTIVE:  Note: Objective measures were completed at Evaluation unless otherwise noted.  DIAGNOSTIC FINDINGS:  Pt is post op.                                                                                                                             TREATMENT DATE:          5/19  Pt received R shoulder PROM in flexion, abd, ER, and IR per pt and tissue tolerance within protocol ranges.  Pt performed: Seated pulleys in flexion, scaption, abd x 20 reps each Supine serratus punch 2# x10-15, 3# x 10 Supine shoulder ABC x 1 rep with 1# S/L shoulder abd 2x10 S/L ER with 1# 2x10 Standing wand IR 2x10 Sanding functional IR AROM 2x10 Standing jobe's flexion 2x10 with 2# in front of mirror Rows to neutral with retraction with RTB 2x10     PATIENT EDUCATION: Education details: HEP, exercise form, progression of protocol, dx, relevant anatomy, and POC.  Rationale of HEP and instructed pt to continue with HEP.  PT answered pt's questions. Person educated: Patient Education method: Explanation, Demonstration, Tactile cues, Verbal cues Education comprehension: verbalized understanding, returned demonstration, verbal cues required,  tactile cues required, and needs further education  HOME EXERCISE PROGRAM: Access Code: [facility] URL: [email or web address] Date: 01/29/2024 Prepared by: Marnie Siren     ASSESSMENT:  CLINICAL IMPRESSION: Pt is progressing well with shoulder ROM, strength, and function.  He is progressing appropriately with protocol.  Pt is improving with strength as evidenced  by performance of exercises.  Pt tolerated PROM well and has tightness at end ranges.  Pt is limited with reaching behind back.  PT instructed pt in correct form with standing wand IR and standing functional IR AROM to improve mobility with reaching behind back.  Pt responded well to Rx reporting no increased pain, and states he feels like his shoulder is worked out.  Pt will benefit from continued skilled PT services per protocol to address impairments and goals and to assist with restoring desired level of function.      OBJECTIVE IMPAIRMENTS: decreased activity tolerance, decreased ROM, decreased strength, hypomobility, impaired flexibility, impaired UE functional use, and pain.   ACTIVITY LIMITATIONS: carrying, lifting, bathing, dressing, reach over head, and hygiene/grooming  PARTICIPATION LIMITATIONS: meal prep, cleaning, laundry, shopping, and community activity  PERSONAL FACTORS: 3+ comorbidities: L4-S1 fusion on 05/07/2023, cervical fusion, CVA, DM Type 2, peripheral neuropathy  are also affecting patient's functional outcome.  are also affecting patient's functional outcome.   REHAB POTENTIAL: Good  CLINICAL DECISION MAKING: Stable/uncomplicated  EVALUATION COMPLEXITY: Low   GOALS:   SHORT TERM GOALS: Target date:  02/26/24   Pt will be independent with HEP for improved pain, ROM, and function.   Baseline: Goal status: GOAL MET  4/14  2.  Pt will progress with PROM per protocol and demo 115 deg in flexion, 90 deg in abd, and 45 deg in ER for improved mobility and stiffness.  Baseline:  Goal  status:  GOAL MET    3.  Pt will progress with shoulder AAROM per protocol without adverse effects for improved shoulder mobility and performance of daily activities.  Baseline:  Goal status: GOAL MET  4/14  4.  Pt will demo R shoulder flexion AAROM in supine to 130 deg and ER AAROM to 40 deg for improved stiffness and mobility.   Baseline:  Goal status:  GOAL MET  4/14 Target date:  03/11/24   5.  Pt will be able to actively elevate R shoulder > 90 deg without significant shoulder hike.  Baseline:  Goal status:  ONGOING Target date:  03/25/24   6.  Pt will be able to perform his self care activities with no > than minimal difficulty and without significant pain.  Baseline:  Goal status: PROGRESSING  4/14 Target date:  03/25/2024   LONG TERM GOALS: Target date:  05/12/2024    Pt will be able to perform his ADLs and IADLs without significant difficulty and pain.  Baseline:  Goal status: INITIAL  2.  Pt will be able to perform his normal reaching and functional overhead activities without significant pain and limitation.  Baseline:  Goal status: INITIAL  3.   Pt will demo L shoulder AROM to be [facility] t/o for performance of ADLs and IADLs.  Baseline:  Goal status: INITIAL  4.  Pt will be able to reach into an overhead cabinet without significant difficulty.  Baseline:  Goal status: INITIAL  5.  Pt will progress with strengthening exercises per protocol without adverse effects in order to perform his normal functional lifting/carrying activities and household chores.   Baseline:  Goal status: INITIAL   PLAN:  PT FREQUENCY: 2x/week  PT DURATION: 8 weeks  PLANNED INTERVENTIONS: [id number]- PT Re-evaluation, 97110-Therapeutic exercises, 97530- Therapeutic activity, 97112- Neuromuscular re-education, 97535- Self Care, 16109- Manual therapy, [phone]- Aquatic Therapy, [id number]- Electrical stimulation (unattended), Patient/Family education, Taping, Dry Needling, Joint mobilization, Scar  mobilization, Cryotherapy, and Moist heat  PLAN FOR NEXT SESSION: Cont per  Dr. Leandro Proffer Reverse Total Shoulder Arthroplasty protocol.  Possible STM to UT/cervical paraspinals.      Trina Fujita III PT, DPT 04/22/24 2:43 PM

## 2024-04-23 ENCOUNTER — Encounter (HOSPITAL_BASED_OUTPATIENT_CLINIC_OR_DEPARTMENT_OTHER): Payer: Self-pay | Admitting: Physical Therapy

## 2024-04-23 ENCOUNTER — Ambulatory Visit (HOSPITAL_BASED_OUTPATIENT_CLINIC_OR_DEPARTMENT_OTHER): Admitting: Physical Therapy

## 2024-04-23 DIAGNOSIS — M6281 Muscle weakness (generalized): Secondary | ICD-10-CM

## 2024-04-23 DIAGNOSIS — M25511 Pain in right shoulder: Secondary | ICD-10-CM

## 2024-04-23 DIAGNOSIS — M25611 Stiffness of right shoulder, not elsewhere classified: Secondary | ICD-10-CM

## 2024-04-23 NOTE — Therapy (Signed)
 OUTPATIENT PHYSICAL THERAPY SHOULDER TREATMENT        Patient Name: James Barber MRN: [id number] DOB:1946/09/07, 78 y.o., male Today's Date: 04/24/2024  END OF SESSION: PT End of Session -   04/23/2024    Visit Number 18   Number of Visits 26   Date for PT Re-Evaluation 05/12/2024   Authorization Type BC[employer] MCR   PT Start Time  1030 Pt arrived 15 mins late to treatment   PT Stop Time 1112   PT Time Calculation (min) 42 mins   Activity Tolerance Patient tolerated treatment well   Behavior During Therapy  WFL for tasks assessed/performed        Past Medical History:  Diagnosis Date   Arthritis    Bowel obstruction (HCC)    Cancer (HCC)    prostate- "everything removed and clear"   Complication of anesthesia    Hard to wake and spikes fever   Diabetes mellitus without complication (HCC)    GERD (gastroesophageal reflux disease)    pt has not had issues with this in "a long time"   History of pulmonary embolus (PE)    Hypertension    Hypothyroidism    Ischemic stroke (HCC) 12/2022   Sleep apnea    Past Surgical History:  Procedure Laterality Date   ABDOMINAL SURGERY     "had ileostomy for about 4 months and had it reversed- due to salmonella infection- this happened in [place] "   ANTERIOR CERVICAL DECOMP/DISCECTOMY FUSION  11/12/2012   Procedure: ANTERIOR CERVICAL DECOMPRESSION/DISCECTOMY FUSION 2 LEVELS;  Surgeon: Elna Haggis, MD;  Location: [facility];  Service: Neurosurgery;  Laterality: N/A;  Right Side approach Cervical six-seven,Cervical seven -thoracic one Anterior cervical decompression/diskectomy/fusion   BACK SURGERY  2023   x2 (outpatient with Dr. Ellery Guthrie)   CARPAL TUNNEL RELEASE Bilateral    CERVICAL FUSION     c 3-4, C 4-5, C5-6   HERNIA REPAIR     right inguinal   LAPAROTOMY N/A 03/22/2015   Procedure: DIAGONOSTIC LAPAROSCOPY, LAPROSCOPIC LYSIS OF ADHESIONS FOR ONE HOUR, DISTAL SMALL BOWEL RESECTION;  Surgeon: Aldean Hummingbird, MD;  Location: [facility];  Service: General;  Laterality: N/A;   REVERSE SHOULDER ARTHROPLASTY Right 12/27/2023   Procedure: REVERSE SHOULDER ARTHROPLASTY;  Surgeon: Ellard Gunning, MD;  Location: [facility];  Service: Orthopedics;  Laterality: Right;    Patient Active Problem List   Diagnosis Date Noted   Spondylolisthesis at L4-L5 level 05/07/2023   Acute pulmonary embolus (HCC) 04/19/2015   Rash and nonspecific skin eruption 04/19/2015   Diabetes mellitus, type II (HCC) 03/30/2015   Elevated LFTs 03/30/2015   Abnormal liver function tests 03/30/2015   Incisional hernia, without obstruction or gangrene 03/21/2015   Recurrent SBO (small bowel obstruction) 03/15/2015   HYPERLIPIDEMIA 03/09/2008   Iron deficiency anemia 03/09/2008   GERD 03/09/2008   Bilateral inguinal hernia (BIH), Left > Right 03/09/2008     REFERRING PROVIDER: Ellard Gunning, MD   REFERRING DIAG: ICD-10:  Z96.611  Presence of right artificial shoulder joint  THERAPY DIAG:  Right shoulder pain, unspecified chronicity  Stiffness of right shoulder, not elsewhere classified  Muscle weakness (generalized)  Rationale for Evaluation and Treatment: Rehabilitation  ONSET DATE: DOS  12/27/2023  SUBJECTIVE:  SUBJECTIVE STATEMENT: Pt is 16 weeks and 6 days s/p R reverse total shoulder arthroplasty.  Pt states his shoulder feels the same and is doing fine.  Pt's back continues to bother him and he reports having a constant irritation.  Pt denies any adverse effects after prior Rx.  Pt denies shoulder pain currently.      Hand dominance: Right  PERTINENT HISTORY: R reverse total shoulder arthroplasty  12/27/23  L4-S1 fusion on 05/07/2023.  Pt has had 2 prior lumbar surgeries.   Hx of 5 cervical surgeries including cervical fusion C2-T1.   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  Memory difficulty   Hx of PE in 2016 and 2021 HTN  PAIN:  NPRS:  0/10 current, 4/10 worst Location:  R shoulder primarily anterior and lateral.  PRECAUTIONS: Other: Reverse shoulder arthoplasty protocol, Lumbar fusion, peripheral neuropathy, hx of cervical fusion, CVA   WEIGHT BEARING RESTRICTIONS: Yes R UE  FALLS:  Has patient fallen in last 6 months? No  LIVING ENVIRONMENT: Lives with: lives with their family Lives in: 2 story home, but stays on 1st floor mainly Stairs: yes  OCCUPATION: Pt is retired.  PLOF: Independent  PATIENT GOALS:  to not have pain.  To have full use of R UE.   NEXT MD VISIT:   OBJECTIVE:  Note: Objective measures were completed at Evaluation unless otherwise noted.  DIAGNOSTIC FINDINGS:  Pt is post op.                                                                                                                             TREATMENT DATE:          5/21  Pt performed: Seated pulleys in flexion, scaption, abd x 20 reps each Supine serratus punch 3# 3 x 10 Supine shoulder ABC x 1 rep with 1# Supine rhythmic stab's 3x30 sec at 90 deg flex S/L shoulder abd 2x10 S/L ER with 1# 2x10 Standing flexion AROM x 10 Standing wand IR 2x10 Sanding functional IR AROM 2x10 Standing jobe's flexion 2x10 with 2# in front of mirror Rows to neutral with retraction with RTB 2x10  PT updated HEP and gave pt a HEP handout.  PT educated pt in correct form and appropriate frequency.  PT instructed pt he should not perform exercises into a painful range.     PATIENT EDUCATION: Education details: HEP, exercise form, progression of protocol, dx, relevant anatomy, and POC.  Rationale of HEP and instructed pt to continue with HEP.  PT answered pt's questions. Person educated: Patient Education method: Explanation, Demonstration, Tactile cues, Verbal  cues Education comprehension: verbalized understanding, returned demonstration, verbal cues required, tactile cues required, and needs further education  HOME EXERCISE PROGRAM: Access Code: [facility] URL: [email or web address] Date: 01/29/2024 Prepared by: Marnie Siren  Updated HEP: - Standing Shoulder Flexion Full Range Single Arm  - 1 x daily - 7  x weekly - 2 sets - 5-10 reps - Standing Bilateral Shoulder Internal Rotation AAROM with Dowel  - 2 x daily - 7 x weekly - 1 sets - 10 reps    ASSESSMENT:  CLINICAL IMPRESSION: Pt is progressing well with function and states his shoulder is doing well.  Pt is improving with shoulder flexion AROM in standing.  He is progressing appropriately with protocol.  Pt performed exercises well with cuing and instruction in correct form.  PT updated HEP with exercises to improve ROM and reaching.  He responded well to Rx reporting no increased pain after Rx.  Pt will benefit from continued skilled PT services per protocol to address impairments and goals and to assist with restoring desired level of function.       OBJECTIVE IMPAIRMENTS: decreased activity tolerance, decreased ROM, decreased strength, hypomobility, impaired flexibility, impaired UE functional use, and pain.   ACTIVITY LIMITATIONS: carrying, lifting, bathing, dressing, reach over head, and hygiene/grooming  PARTICIPATION LIMITATIONS: meal prep, cleaning, laundry, shopping, and community activity  PERSONAL FACTORS: 3+ comorbidities: L4-S1 fusion on 05/07/2023, cervical fusion, CVA, DM Type 2, peripheral neuropathy  are also affecting patient's functional outcome.  are also affecting patient's functional outcome.   REHAB POTENTIAL: Good  CLINICAL DECISION MAKING: Stable/uncomplicated  EVALUATION COMPLEXITY: Low   GOALS:   SHORT TERM GOALS: Target date:  02/26/24   Pt will be independent with HEP for improved pain, ROM, and function.   Baseline: Goal status: GOAL  MET  4/14  2.  Pt will progress with PROM per protocol and demo 115 deg in flexion, 90 deg in abd, and 45 deg in ER for improved mobility and stiffness.  Baseline:  Goal status:  GOAL MET    3.  Pt will progress with shoulder AAROM per protocol without adverse effects for improved shoulder mobility and performance of daily activities.  Baseline:  Goal status: GOAL MET  4/14  4.  Pt will demo R shoulder flexion AAROM in supine to 130 deg and ER AAROM to 40 deg for improved stiffness and mobility.   Baseline:  Goal status:  GOAL MET  4/14 Target date:  03/11/24   5.  Pt will be able to actively elevate R shoulder > 90 deg without significant shoulder hike.  Baseline:  Goal status:  ONGOING Target date:  03/25/24   6.  Pt will be able to perform his self care activities with no > than minimal difficulty and without significant pain.  Baseline:  Goal status: PROGRESSING  4/14 Target date:  03/25/2024   LONG TERM GOALS: Target date:  05/12/2024    Pt will be able to perform his ADLs and IADLs without significant difficulty and pain.  Baseline:  Goal status: INITIAL  2.  Pt will be able to perform his normal reaching and functional overhead activities without significant pain and limitation.  Baseline:  Goal status: INITIAL  3.   Pt will demo L shoulder AROM to be [facility] t/o for performance of ADLs and IADLs.  Baseline:  Goal status: INITIAL  4.  Pt will be able to reach into an overhead cabinet without significant difficulty.  Baseline:  Goal status: INITIAL  5.  Pt will progress with strengthening exercises per protocol without adverse effects in order to perform his normal functional lifting/carrying activities and household chores.   Baseline:  Goal status: INITIAL   PLAN:  PT FREQUENCY: 2x/week  PT DURATION: 8 weeks  PLANNED INTERVENTIONS: 97164- PT Re-evaluation, 97110-Therapeutic exercises, 97530-  Therapeutic activity, [id number]- Neuromuscular re-education, [phone]- Self  Care, 62130- Manual therapy, [id number]- Aquatic Therapy, [phone]- Electrical stimulation (unattended), Patient/Family education, Taping, Dry Needling, Joint mobilization, Scar mobilization, Cryotherapy, and Moist heat  PLAN FOR NEXT SESSION: Cont per Dr. Leandro Proffer Reverse Total Shoulder Arthroplasty protocol.  Possible STM to UT/cervical paraspinals.      Trina Fujita III PT, DPT 04/24/24 6:17 PM

## 2024-04-30 ENCOUNTER — Ambulatory Visit (HOSPITAL_BASED_OUTPATIENT_CLINIC_OR_DEPARTMENT_OTHER): Admitting: Physical Therapy

## 2024-04-30 DIAGNOSIS — M6281 Muscle weakness (generalized): Secondary | ICD-10-CM

## 2024-04-30 DIAGNOSIS — M25511 Pain in right shoulder: Secondary | ICD-10-CM | POA: Diagnosis not present

## 2024-04-30 DIAGNOSIS — M25611 Stiffness of right shoulder, not elsewhere classified: Secondary | ICD-10-CM

## 2024-04-30 NOTE — Therapy (Signed)
 OUTPATIENT PHYSICAL THERAPY SHOULDER TREATMENT        Patient Name: James Barber MRN: [id number] DOB:[birth year], 78 y.o., male Today's Date: 05/01/2024  END OF SESSION: PT End of Session -   04/30/2024    Visit Number 19   Number of Visits 19   Date for PT Re-Evaluation    Authorization Type BCBS MCR   PT Start Time  1023   PT Stop Time 1110   PT Time Calculation (min) 47 mins   Activity Tolerance Patient tolerated treatment well   Behavior During Therapy  WFL for tasks assessed/performed        Past Medical History:  Diagnosis Date   Arthritis    Bowel obstruction (HCC)    Cancer (HCC)    prostate- "everything removed and clear"   Complication of anesthesia    Hard to wake and spikes fever   Diabetes mellitus without complication (HCC)    GERD (gastroesophageal reflux disease)    pt has not had issues with this in "a long time"   History of pulmonary embolus (PE)    Hypertension    Hypothyroidism    Ischemic stroke (HCC) 12/2022   Sleep apnea    Past Surgical History:  Procedure Laterality Date   ABDOMINAL SURGERY     "had ileostomy for about 4 months and had it reversed- due to salmonella infection- this happened in [place] "   ANTERIOR CERVICAL DECOMP/DISCECTOMY FUSION  11/12/2012   Procedure: ANTERIOR CERVICAL DECOMPRESSION/DISCECTOMY FUSION 2 LEVELS;  Surgeon: Elna Haggis, MD;  Location: [facility];  Service: Neurosurgery;  Laterality: N/A;  Right Side approach Cervical six-seven,Cervical seven -thoracic one Anterior cervical decompression/diskectomy/fusion   BACK SURGERY  2023   x2 (outpatient with Dr. Ellery Guthrie)   CARPAL TUNNEL RELEASE Bilateral    CERVICAL FUSION     c 3-4, C 4-5, C5-6   HERNIA REPAIR     right inguinal   LAPAROTOMY N/A 03/22/2015   Procedure: DIAGONOSTIC LAPAROSCOPY, LAPROSCOPIC LYSIS OF ADHESIONS FOR ONE HOUR, DISTAL SMALL BOWEL RESECTION;  Surgeon: Aldean Hummingbird, MD;  Location: [facility];  Service: General;  Laterality: N/A;    REVERSE SHOULDER ARTHROPLASTY Right 12/27/2023   Procedure: REVERSE SHOULDER ARTHROPLASTY;  Surgeon: Ellard Gunning, MD;  Location: [facility];  Service: Orthopedics;  Laterality: Right;    Patient Active Problem List   Diagnosis Date Noted   Spondylolisthesis at L4-L5 level 05/07/2023   Acute pulmonary embolus (HCC) 04/19/2015   Rash and nonspecific skin eruption 04/19/2015   Diabetes mellitus, type II (HCC) 03/30/2015   Elevated LFTs 03/30/2015   Abnormal liver function tests 03/30/2015   Incisional hernia, without obstruction or gangrene 03/21/2015   Recurrent SBO (small bowel obstruction) 03/15/2015   HYPERLIPIDEMIA 03/09/2008   Iron deficiency anemia 03/09/2008   GERD 03/09/2008   Bilateral inguinal hernia (BIH), Left > Right 03/09/2008     REFERRING PROVIDER: Ellard Gunning, MD   REFERRING DIAG: ICD-10:  Z96.611  Presence of right artificial shoulder joint  THERAPY DIAG:  Right shoulder pain, unspecified chronicity  Stiffness of right shoulder, not elsewhere classified  Muscle weakness (generalized)  Rationale for Evaluation and Treatment: Rehabilitation  ONSET DATE: DOS  12/27/2023  SUBJECTIVE:  SUBJECTIVE STATEMENT: Pt is 17 weeks and 6 days s/p R reverse total shoulder arthroplasty.  Pt states his shoulder is doing pretty good.  He feels that today could be his last day.  Pt reports he is able to perform his ADLs and IADLs without significant pain and difficulty.  Pt was able to vacuum the home without difficulty.  Pt is challenged with reaching behind his back.  He states he is not restricted with anything.  "I'm getting to the point that I'm doing things without hesitating."     "My back hurts almost all the time to some degree."  Pt denies any adverse effects after prior Rx.  Pt denies  shoulder pain currently.      Hand dominance: Right  PERTINENT HISTORY: R reverse total shoulder arthroplasty  12/27/23  L4-S1 fusion on 05/07/2023.  Pt has had 2 prior lumbar surgeries.   Hx of 5 cervical surgeries including cervical fusion C2-T1.   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  Memory difficulty   Hx of PE in 2016 and 2021 HTN  PAIN:  NPRS:  0/10 current, 2-3/10 worst Location:  R shoulder   PRECAUTIONS: Other: Reverse shoulder arthoplasty protocol, Lumbar fusion, peripheral neuropathy, hx of cervical fusion, CVA   WEIGHT BEARING RESTRICTIONS: Yes R UE  FALLS:  Has patient fallen in last 6 months? No  LIVING ENVIRONMENT: Lives with: lives with their family Lives in: 2 story home, but stays on 1st floor mainly Stairs: yes  OCCUPATION: Pt is retired.  PLOF: Independent  PATIENT GOALS:  to not have pain.  To have full use of R UE.   NEXT MD VISIT:   OBJECTIVE:  Note: Objective measures were completed at Evaluation unless otherwise noted.  DIAGNOSTIC FINDINGS:  Pt is post op.                                                                                                                             TREATMENT DATE:          5/28  Reviewed current function, pain level, and response to prior treatment.  Pt able to reach into an overhead cabinet well without difficulty.   R shoulder AROM: Flexion:  124 Scaption:  113 ER:  47  (53 PROM) IR:  56  R shoulder strength (MMT): Flexion:  4-/5 Scaption:  4-/5 ER:  4/5  UEFI:  Prior:  48/80.  Current:  65/80  Pt performed: Seated pulleys in flexion, scaption x 20 reps each ER with arm at side with YTB 2X10 S/L ER  2x10 with 1# Supine shoulder ABC with 1# x 1 rep Standing jobe's flexion 2x10 with 2#  PT reviewed HEP and updated HEP.  Pt received a HEP handout and was educated in correct form and appropriate  frequency.    PATIENT EDUCATION: Education details: HEP, exercise form, progression of protocol, dx, relevant anatomy,  and POC.  Rationale of HEP and instructed pt to continue with HEP.  PT answered pt's questions. Person educated: Patient Education method: Explanation, Demonstration, Tactile cues, Verbal cues Education comprehension: verbalized understanding, returned demonstration, verbal cues required, tactile cues required, and needs further education  HOME EXERCISE PROGRAM: Access Code: [facility] URL: [email or web address] Date: 01/29/2024 Prepared by: Marnie Siren  Updated HEP: - Shoulder External Rotation with Anchored Resistance  - 1 x daily - 3 x weekly - 2-3 sets - 10 reps - Supine Shoulder Alphabet  - 1 x daily - 3 x weekly - 1 reps - Standing Shoulder Flexion with Dumbbells  - 3 x weekly - 2-3 sets - 10 reps - Standing Shoulder Row with Anchored Resistance  - 1 x daily - 3-4 x weekly - 2 sets - 10 reps    ASSESSMENT:  CLINICAL IMPRESSION: Pt has made good progress in PT.  Pt reports he is able to perform his ADLs and IADLs without significant pain and difficulty.  He was able to vacuum his home without difficulty.  Pt does have some limitations in ROM though has made good progress.  He has limitations with reaching behind his back and has home exercises to improve mobility.  Pt is able to reach into an overhead cabinet well without difficulty.  Pt has progressed well with protocol.  Pt has expected weakness in R shoulder though has been improving with strength as evidenced by performance of exercises.  Pt demonstrates clinically significant improvement in self perceived disability with UEFI improving from prior 48/80 to currently 65/80.  PT spent time reviewing and educating pt concerning HEP.  PT updated HEP and gave pt a HEP handout.  Pt demonstrates good understanding of updated HEP and is independent with HEP.  Pt has met STG's #1-4,6 and  LTG'S #1,2,4,5.  Pt  states he is ready for discharge.      OBJECTIVE IMPAIRMENTS: decreased activity tolerance, decreased ROM, decreased strength, hypomobility, impaired flexibility, impaired UE functional use, and pain.   ACTIVITY LIMITATIONS: carrying, lifting, bathing, dressing, reach over head, and hygiene/grooming  PARTICIPATION LIMITATIONS: meal prep, cleaning, laundry, shopping, and community activity  PERSONAL FACTORS: 3+ comorbidities: L4-S1 fusion on 05/07/2023, cervical fusion, CVA, DM Type 2, peripheral neuropathy  are also affecting patient's functional outcome.  are also affecting patient's functional outcome.   REHAB POTENTIAL: Good  CLINICAL DECISION MAKING: Stable/uncomplicated  EVALUATION COMPLEXITY: Low   GOALS:   SHORT TERM GOALS: Target date:  02/26/24   Pt will be independent with HEP for improved pain, ROM, and function.   Baseline: Goal status: GOAL MET  4/14  2.  Pt will progress with PROM per protocol and demo 115 deg in flexion, 90 deg in abd, and 45 deg in ER for improved mobility and stiffness.  Baseline:  Goal status:  GOAL MET    3.  Pt will progress with shoulder AAROM per protocol without adverse effects for improved shoulder mobility and performance of daily activities.  Baseline:  Goal status: GOAL MET  4/14  4.  Pt will demo R shoulder flexion AAROM in supine to 130 deg and ER AAROM to 40 deg for improved stiffness and mobility.   Baseline:  Goal status:  GOAL MET  4/14 Target date:  03/11/24   5.  Pt will be able to actively elevate R shoulder > 90 deg without significant shoulder hike.  Baseline:  Goal status:  ONGOING Target date:  03/25/24   6.  Pt will be able  to perform his self care activities with no > than minimal difficulty and without significant pain.  Baseline:  Goal status: GOAL MET  5/28 Target date:  03/25/2024   LONG TERM GOALS: Target date:  05/12/2024    Pt will be able to perform his ADLs and IADLs without significant difficulty  and pain.  Baseline:  Goal status: GOAL MET  5/28  2.  Pt will be able to perform his normal reaching and functional overhead activities without significant pain and limitation.  Baseline:  Goal status: GOAL MET  5/28  3.   Pt will demo L shoulder AROM to be [facility] t/o for performance of ADLs and IADLs.  Baseline:  Goal status:  IMPROVED, BUT NOT MET  5/28  4.  Pt will be able to reach into an overhead cabinet without significant difficulty.  Baseline:  Goal status: GOAL MET  5/28  5.  Pt will progress with strengthening exercises per protocol without adverse effects in order to perform his normal functional lifting/carrying activities and household chores.   Baseline:  Goal status: GOAL MET  5/28   PLAN:   PLANNED INTERVENTIONS: 97164- PT Re-evaluation, 97110-Therapeutic exercises, 97530- Therapeutic activity, 97112- Neuromuscular re-education, 97535- Self Care, 78295- Manual therapy, [phone]- Aquatic Therapy, [phone]- Electrical stimulation (unattended), Patient/Family education, Taping, Dry Needling, Joint mobilization, Scar mobilization, Cryotherapy, and Moist heat  PLAN FOR NEXT SESSION:   Pt to be discharged from skilled PT due to good progress toward goals and being ready for discharge.  He will cont with HEP.  PHYSICAL THERAPY DISCHARGE SUMMARY  Visits from Start of Care: 19  Current functional level related to goals / functional outcomes: See above   Remaining deficits: See above   Education / Equipment: See above     Trina Fujita III PT, DPT 05/01/24 10:55 AM

## 2024-05-01 ENCOUNTER — Encounter (HOSPITAL_BASED_OUTPATIENT_CLINIC_OR_DEPARTMENT_OTHER): Payer: Self-pay | Admitting: Physical Therapy
